# Patient Record
Sex: Female | Born: 1935 | Race: White | Hispanic: No | Smoking: Never smoker
Health system: Southern US, Community
[De-identification: ages and names within clinical notes are randomized; demographics above are authoritative.]

## PROBLEM LIST (undated history)

## (undated) DIAGNOSIS — F329 Major depressive disorder, single episode, unspecified: Secondary | ICD-10-CM

## (undated) DIAGNOSIS — I10 Essential (primary) hypertension: Secondary | ICD-10-CM

## (undated) DIAGNOSIS — D649 Anemia, unspecified: Secondary | ICD-10-CM

## (undated) DIAGNOSIS — E785 Hyperlipidemia, unspecified: Secondary | ICD-10-CM

## (undated) DIAGNOSIS — E039 Hypothyroidism, unspecified: Secondary | ICD-10-CM

## (undated) DIAGNOSIS — M1990 Unspecified osteoarthritis, unspecified site: Secondary | ICD-10-CM

## (undated) DIAGNOSIS — K219 Gastro-esophageal reflux disease without esophagitis: Secondary | ICD-10-CM

## (undated) DIAGNOSIS — E079 Disorder of thyroid, unspecified: Secondary | ICD-10-CM

## (undated) DIAGNOSIS — Z9289 Personal history of other medical treatment: Secondary | ICD-10-CM

## (undated) DIAGNOSIS — F419 Anxiety disorder, unspecified: Secondary | ICD-10-CM

## (undated) DIAGNOSIS — Z973 Presence of spectacles and contact lenses: Secondary | ICD-10-CM

## (undated) DIAGNOSIS — Z9889 Other specified postprocedural states: Secondary | ICD-10-CM

## (undated) DIAGNOSIS — G4700 Insomnia, unspecified: Secondary | ICD-10-CM

## (undated) DIAGNOSIS — K5900 Constipation, unspecified: Secondary | ICD-10-CM

## (undated) DIAGNOSIS — F32A Depression, unspecified: Secondary | ICD-10-CM

## (undated) DIAGNOSIS — E1129 Type 2 diabetes mellitus with other diabetic kidney complication: Secondary | ICD-10-CM

## (undated) DIAGNOSIS — M5410 Radiculopathy, site unspecified: Secondary | ICD-10-CM

## (undated) HISTORY — DX: Radiculopathy, site unspecified: M54.10

## (undated) HISTORY — PX: ABDOMINAL HYSTERECTOMY: SHX81

## (undated) HISTORY — DX: Depression, unspecified: F32.A

## (undated) HISTORY — PX: APPENDECTOMY: SHX54

## (undated) HISTORY — DX: Type 2 diabetes mellitus with other diabetic kidney complication: E11.29

## (undated) HISTORY — DX: Anemia, unspecified: D64.9

## (undated) HISTORY — PX: COLONOSCOPY: SHX174

## (undated) HISTORY — DX: Disorder of thyroid, unspecified: E07.9

## (undated) HISTORY — DX: Other specified postprocedural states: Z98.890

## (undated) HISTORY — DX: Major depressive disorder, single episode, unspecified: F32.9

## (undated) HISTORY — DX: Hyperlipidemia, unspecified: E78.5

## (undated) HISTORY — DX: Anxiety disorder, unspecified: F41.9

## (undated) HISTORY — PX: BREAST BIOPSY: SHX20

## (undated) HISTORY — DX: Gastro-esophageal reflux disease without esophagitis: K21.9

## (undated) HISTORY — PX: TONSILLECTOMY: SUR1361

---

## 1998-03-01 ENCOUNTER — Ambulatory Visit (HOSPITAL_COMMUNITY): Admission: RE | Admit: 1998-03-01 | Discharge: 1998-03-01 | Payer: Self-pay | Admitting: Cardiology

## 1998-12-26 ENCOUNTER — Encounter: Payer: Self-pay | Admitting: Internal Medicine

## 1998-12-26 ENCOUNTER — Ambulatory Visit (HOSPITAL_COMMUNITY): Admission: RE | Admit: 1998-12-26 | Discharge: 1998-12-26 | Payer: Self-pay | Admitting: Internal Medicine

## 1999-01-24 ENCOUNTER — Encounter: Payer: Self-pay | Admitting: Internal Medicine

## 1999-01-24 ENCOUNTER — Ambulatory Visit (HOSPITAL_COMMUNITY): Admission: RE | Admit: 1999-01-24 | Discharge: 1999-01-24 | Payer: Self-pay | Admitting: Internal Medicine

## 1999-07-29 ENCOUNTER — Ambulatory Visit (HOSPITAL_COMMUNITY): Admission: RE | Admit: 1999-07-29 | Discharge: 1999-07-29 | Payer: Self-pay | Admitting: Internal Medicine

## 1999-07-29 ENCOUNTER — Encounter: Payer: Self-pay | Admitting: Internal Medicine

## 2000-01-14 ENCOUNTER — Ambulatory Visit (HOSPITAL_COMMUNITY): Admission: RE | Admit: 2000-01-14 | Discharge: 2000-01-14 | Payer: Self-pay | Admitting: Orthopaedic Surgery

## 2000-03-04 ENCOUNTER — Ambulatory Visit (HOSPITAL_COMMUNITY): Admission: RE | Admit: 2000-03-04 | Discharge: 2000-03-04 | Payer: Self-pay | Admitting: Gastroenterology

## 2000-07-29 ENCOUNTER — Encounter: Payer: Self-pay | Admitting: Internal Medicine

## 2000-07-29 ENCOUNTER — Ambulatory Visit (HOSPITAL_COMMUNITY): Admission: RE | Admit: 2000-07-29 | Discharge: 2000-07-29 | Payer: Self-pay | Admitting: Internal Medicine

## 2001-07-30 ENCOUNTER — Ambulatory Visit (HOSPITAL_COMMUNITY): Admission: RE | Admit: 2001-07-30 | Discharge: 2001-07-30 | Payer: Self-pay | Admitting: Internal Medicine

## 2001-07-30 ENCOUNTER — Encounter: Payer: Self-pay | Admitting: Internal Medicine

## 2001-08-03 ENCOUNTER — Encounter: Payer: Self-pay | Admitting: Internal Medicine

## 2001-08-03 ENCOUNTER — Encounter: Admission: RE | Admit: 2001-08-03 | Discharge: 2001-08-03 | Payer: Self-pay | Admitting: Internal Medicine

## 2002-01-03 ENCOUNTER — Encounter: Admission: RE | Admit: 2002-01-03 | Discharge: 2002-01-03 | Payer: Self-pay | Admitting: Internal Medicine

## 2002-01-03 ENCOUNTER — Encounter: Payer: Self-pay | Admitting: Internal Medicine

## 2002-08-04 ENCOUNTER — Ambulatory Visit (HOSPITAL_COMMUNITY): Admission: RE | Admit: 2002-08-04 | Discharge: 2002-08-04 | Payer: Self-pay | Admitting: Internal Medicine

## 2002-08-04 ENCOUNTER — Encounter: Payer: Self-pay | Admitting: Internal Medicine

## 2002-09-24 ENCOUNTER — Emergency Department (HOSPITAL_COMMUNITY): Admission: EM | Admit: 2002-09-24 | Discharge: 2002-09-24 | Payer: Self-pay | Admitting: Emergency Medicine

## 2002-09-28 ENCOUNTER — Encounter: Admission: RE | Admit: 2002-09-28 | Discharge: 2002-09-28 | Payer: Self-pay | Admitting: Internal Medicine

## 2002-09-28 ENCOUNTER — Encounter: Payer: Self-pay | Admitting: Internal Medicine

## 2003-08-07 ENCOUNTER — Encounter: Admission: RE | Admit: 2003-08-07 | Discharge: 2003-08-07 | Payer: Self-pay | Admitting: Internal Medicine

## 2003-08-07 ENCOUNTER — Encounter: Payer: Self-pay | Admitting: Internal Medicine

## 2003-11-11 HISTORY — PX: TOTAL KNEE ARTHROPLASTY: SHX125

## 2004-02-01 ENCOUNTER — Inpatient Hospital Stay (HOSPITAL_COMMUNITY): Admission: RE | Admit: 2004-02-01 | Discharge: 2004-02-06 | Payer: Self-pay | Admitting: Orthopedic Surgery

## 2004-02-09 ENCOUNTER — Ambulatory Visit (HOSPITAL_COMMUNITY): Admission: RE | Admit: 2004-02-09 | Discharge: 2004-02-09 | Payer: Self-pay | Admitting: Orthopedic Surgery

## 2004-08-21 ENCOUNTER — Encounter: Admission: RE | Admit: 2004-08-21 | Discharge: 2004-08-21 | Payer: Self-pay | Admitting: Internal Medicine

## 2005-04-21 ENCOUNTER — Ambulatory Visit (HOSPITAL_COMMUNITY): Admission: RE | Admit: 2005-04-21 | Discharge: 2005-04-21 | Payer: Self-pay | Admitting: Orthopedic Surgery

## 2005-08-22 ENCOUNTER — Encounter: Admission: RE | Admit: 2005-08-22 | Discharge: 2005-08-22 | Payer: Self-pay | Admitting: Internal Medicine

## 2005-10-10 ENCOUNTER — Encounter: Admission: RE | Admit: 2005-10-10 | Discharge: 2005-10-10 | Payer: Self-pay | Admitting: Orthopedic Surgery

## 2005-10-24 ENCOUNTER — Inpatient Hospital Stay (HOSPITAL_COMMUNITY): Admission: EM | Admit: 2005-10-24 | Discharge: 2005-10-28 | Payer: Self-pay | Admitting: Orthopedic Surgery

## 2005-10-24 ENCOUNTER — Ambulatory Visit: Payer: Self-pay | Admitting: Infectious Diseases

## 2005-11-10 HISTORY — PX: TOTAL KNEE REVISION: SHX996

## 2005-11-27 ENCOUNTER — Ambulatory Visit: Admission: RE | Admit: 2005-11-27 | Discharge: 2005-11-27 | Payer: Self-pay | Admitting: Orthopedic Surgery

## 2005-12-03 ENCOUNTER — Ambulatory Visit: Payer: Self-pay | Admitting: Infectious Diseases

## 2006-01-08 ENCOUNTER — Encounter (INDEPENDENT_AMBULATORY_CARE_PROVIDER_SITE_OTHER): Payer: Self-pay | Admitting: *Deleted

## 2006-01-08 ENCOUNTER — Inpatient Hospital Stay (HOSPITAL_COMMUNITY): Admission: RE | Admit: 2006-01-08 | Discharge: 2006-01-13 | Payer: Self-pay | Admitting: Orthopedic Surgery

## 2006-08-24 ENCOUNTER — Encounter: Admission: RE | Admit: 2006-08-24 | Discharge: 2006-08-24 | Payer: Self-pay | Admitting: Internal Medicine

## 2006-08-26 ENCOUNTER — Ambulatory Visit: Payer: Self-pay | Admitting: Infectious Diseases

## 2006-08-26 LAB — CONVERTED CEMR LAB
Basophils Absolute: 0.1 10*3/uL (ref 0.0–0.1)
Basophils percent auto: 1 % (ref 0–1)
CRP: 0.2 mg/dL (ref <0.6)
Calcium: 9.1 mg/dL (ref 8.4–10.5)
Eosinophils Absolute: 0.2 cells/mcL (ref 0.0–0.7)
MCHC: 32 g/dL (ref 30.0–36.0)
MCV: 97.6 fL (ref 78.0–100.0)
Monocytes Relative: 7 % (ref 3–11)
Neutro Abs: 3.7 10*3/uL (ref 1.7–7.7)
Neutrophils Relative %: 60 % (ref 43–77)
Platelets: 341 10*3/uL (ref 150–400)
Potassium: 3.8 meq/L (ref 3.5–5.3)
RBC: 3.68 M/uL — ABNORMAL LOW (ref 3.87–5.11)
RDW: 13.2 % (ref 11.5–14.0)
Sed Rate: 4 mm/hr (ref 0–22)
Sodium: 142 meq/L (ref 135–145)
Uric Acid, Serum: 3.6 mg/dL (ref 2.4–7.0)

## 2006-09-01 ENCOUNTER — Ambulatory Visit (HOSPITAL_COMMUNITY): Admission: RE | Admit: 2006-09-01 | Discharge: 2006-09-01 | Payer: Self-pay | Admitting: Infectious Diseases

## 2006-09-07 ENCOUNTER — Encounter: Admission: RE | Admit: 2006-09-07 | Discharge: 2006-09-07 | Payer: Self-pay | Admitting: Internal Medicine

## 2006-09-21 ENCOUNTER — Ambulatory Visit: Payer: Self-pay | Admitting: Infectious Diseases

## 2006-11-10 HISTORY — PX: HAMMER TOE SURGERY: SHX385

## 2006-11-10 HISTORY — PX: GANGLION CYST EXCISION: SHX1691

## 2006-12-31 ENCOUNTER — Encounter (INDEPENDENT_AMBULATORY_CARE_PROVIDER_SITE_OTHER): Payer: Self-pay | Admitting: *Deleted

## 2006-12-31 ENCOUNTER — Ambulatory Visit (HOSPITAL_BASED_OUTPATIENT_CLINIC_OR_DEPARTMENT_OTHER): Admission: RE | Admit: 2006-12-31 | Discharge: 2006-12-31 | Payer: Self-pay | Admitting: Orthopedic Surgery

## 2007-03-15 ENCOUNTER — Encounter: Admission: RE | Admit: 2007-03-15 | Discharge: 2007-03-15 | Payer: Self-pay | Admitting: Orthopedic Surgery

## 2007-03-18 ENCOUNTER — Ambulatory Visit (HOSPITAL_BASED_OUTPATIENT_CLINIC_OR_DEPARTMENT_OTHER): Admission: RE | Admit: 2007-03-18 | Discharge: 2007-03-18 | Payer: Self-pay | Admitting: Orthopedic Surgery

## 2007-06-19 ENCOUNTER — Inpatient Hospital Stay (HOSPITAL_COMMUNITY): Admission: EM | Admit: 2007-06-19 | Discharge: 2007-06-22 | Payer: Self-pay | Admitting: Emergency Medicine

## 2007-06-21 ENCOUNTER — Encounter (INDEPENDENT_AMBULATORY_CARE_PROVIDER_SITE_OTHER): Payer: Self-pay | Admitting: Pediatrics

## 2007-06-30 ENCOUNTER — Inpatient Hospital Stay (HOSPITAL_COMMUNITY): Admission: RE | Admit: 2007-06-30 | Discharge: 2007-07-01 | Payer: Self-pay | Admitting: Interventional Radiology

## 2007-07-14 ENCOUNTER — Encounter: Payer: Self-pay | Admitting: Interventional Radiology

## 2007-08-27 ENCOUNTER — Encounter: Admission: RE | Admit: 2007-08-27 | Discharge: 2007-08-27 | Payer: Self-pay | Admitting: Internal Medicine

## 2007-09-11 HISTORY — PX: CEREBRAL ANEURYSM REPAIR: SHX164

## 2007-09-29 ENCOUNTER — Ambulatory Visit (HOSPITAL_COMMUNITY): Admission: RE | Admit: 2007-09-29 | Discharge: 2007-09-29 | Payer: Self-pay | Admitting: Interventional Radiology

## 2008-02-25 ENCOUNTER — Ambulatory Visit (HOSPITAL_COMMUNITY): Admission: RE | Admit: 2008-02-25 | Discharge: 2008-02-25 | Payer: Self-pay | Admitting: Interventional Radiology

## 2008-03-13 ENCOUNTER — Inpatient Hospital Stay (HOSPITAL_COMMUNITY): Admission: EM | Admit: 2008-03-13 | Discharge: 2008-03-16 | Payer: Self-pay | Admitting: Emergency Medicine

## 2008-03-14 ENCOUNTER — Encounter (INDEPENDENT_AMBULATORY_CARE_PROVIDER_SITE_OTHER): Payer: Self-pay | Admitting: General Surgery

## 2008-07-22 ENCOUNTER — Emergency Department (HOSPITAL_COMMUNITY): Admission: EM | Admit: 2008-07-22 | Discharge: 2008-07-22 | Payer: Self-pay | Admitting: Emergency Medicine

## 2008-08-29 ENCOUNTER — Encounter: Admission: RE | Admit: 2008-08-29 | Discharge: 2008-08-29 | Payer: Self-pay | Admitting: Internal Medicine

## 2008-10-25 ENCOUNTER — Ambulatory Visit (HOSPITAL_COMMUNITY): Admission: RE | Admit: 2008-10-25 | Discharge: 2008-10-25 | Payer: Self-pay | Admitting: Interventional Radiology

## 2009-08-31 ENCOUNTER — Encounter: Admission: RE | Admit: 2009-08-31 | Discharge: 2009-08-31 | Payer: Self-pay | Admitting: Internal Medicine

## 2009-10-24 ENCOUNTER — Ambulatory Visit (HOSPITAL_COMMUNITY): Admission: RE | Admit: 2009-10-24 | Discharge: 2009-10-24 | Payer: Self-pay | Admitting: Interventional Radiology

## 2010-04-11 ENCOUNTER — Encounter (HOSPITAL_COMMUNITY): Admission: RE | Admit: 2010-04-11 | Discharge: 2010-04-12 | Payer: Self-pay | Admitting: Gastroenterology

## 2010-09-02 ENCOUNTER — Encounter: Admission: RE | Admit: 2010-09-02 | Discharge: 2010-09-02 | Payer: Self-pay | Admitting: Internal Medicine

## 2010-11-06 ENCOUNTER — Encounter
Admission: RE | Admit: 2010-11-06 | Discharge: 2010-11-06 | Payer: Self-pay | Source: Home / Self Care | Attending: Orthopedic Surgery | Admitting: Orthopedic Surgery

## 2010-11-10 HISTORY — PX: CHOLECYSTECTOMY: SHX55

## 2010-11-10 HISTORY — PX: JOINT REPLACEMENT: SHX530

## 2010-12-01 ENCOUNTER — Encounter: Payer: Self-pay | Admitting: Interventional Radiology

## 2011-01-27 LAB — CROSSMATCH: Antibody Screen: NEGATIVE

## 2011-02-11 LAB — CBC
HCT: 36.5 % (ref 36.0–46.0)
Hemoglobin: 12.6 g/dL (ref 12.0–15.0)
MCV: 95.6 fL (ref 78.0–100.0)
RBC: 3.82 MIL/uL — ABNORMAL LOW (ref 3.87–5.11)
WBC: 8.5 10*3/uL (ref 4.0–10.5)

## 2011-02-11 LAB — BASIC METABOLIC PANEL
BUN: 17 mg/dL (ref 6–23)
Chloride: 96 mEq/L (ref 96–112)
GFR calc Af Amer: >60 mL/min (ref >60)
Potassium: 3.4 mEq/L — ABNORMAL LOW (ref 3.5–5.1)

## 2011-02-11 LAB — APTT: aPTT: 30 seconds (ref 24–37)

## 2011-03-25 NOTE — Consult Note (Signed)
NAMEAVERIANA, CLOUATRE NO.:  [id number]   MEDICAL RECORD NO.:  [id number]          PATIENT TYPE:  INP   LOCATION:  3031                         FACILITY:  [facility]   PHYSICIAN:  Hilda Lias, [name]   DATE OF BIRTH:  12-15-1932   DATE OF CONSULTATION:  DATE OF DISCHARGE:                                 CONSULTATION   NEUROSURGICAL CONSULT:  Ms. Culton is a lady who was admitted to the hospital because of dizziness  and diplopia.  She has a history of diabetes and hypertension.  She was  admitted and a CT scan of the head was done, which noted the possibility  of aneurysm.  Patient underwent an MRI angio, which showed, in deed, she  has left internal carotid artery aneurysm.  Clinically, today, the  patient is much better.  She has no complaint whatsoever.  She underwent  carotid duplex, which showed there was no evidence of internal carotid  stenosis with abnormal flow of the vertebral artery.  She is being  scheduled for coiling next week by the radiologist.  I spoke with her, I  spoke also with her daughter.  There is no question this will best  option to proceed with coilingand in case that cannot be done they  probably will consider postponing the surgery, but most of the time that  is highly unlikely.  The family had a lot of questions and all of them  were answered.  I also spoke with Dr. Sharene Skeans.  We are going to follow,  very closely on Ms. Balbach while she is in the hospital.  Thank you.           ______________________________  Hilda Lias, [name]     EB/MEDQ  D:  06/21/2007  T:  06/21/2007  Job:  [id number]

## 2011-03-25 NOTE — H&P (Signed)
[name], [name] NO.:  [id number]   MEDICAL RECORD NO.:  [id number]          PATIENT TYPE:  INP   LOCATION:  1843                         FACILITY:  [facility]   PHYSICIAN:  Madaline Savage, MD        DATE OF BIRTH:  19-Oct-1933   DATE OF ADMISSION:  06/19/2007  DATE OF DISCHARGE:                              HISTORY & PHYSICAL   PRIMARY CARE PHYSICIAN:  [name], [name]   CHIEF COMPLAINT:  Dizziness and diplopia.   HISTORY OF PRESENT ILLNESS:  Ms. Bouska is a 77 year old Caucasian lady  with a history of diabetes mellitus, hypertension, hyperlipidemia who  comes in with complaints of feeling tired, seeing double and dizziness.  She states she has been feeling tired for the last one week, having no  energy to do anything.  She denies any focal weakness or any chest pain  or shortness of breath at this time.  She then started having dizziness  for the last 3 to 4 days.  She called her primary care doctor who  started her on a medication which did not help.  She started seeing  double 3 days ago which has been getting worse.  She continues to have  diplopia at this point in time.  She denies any headaches, any speech  disturbance, any dysphagia.  She denies any chest pain, shortness of  breath, abdominal pain, nausea or vomiting.   PAST MEDICAL HISTORY:  1. Diabetes mellitus which is diet controlled.  2. Hypertension.  3. Hypercholesterolemia.  4. History of an irregular heartbeat.  5. Gastroesophageal reflux disease.   PAST SURGICAL HISTORY:  She had right knee surgery in 2006.   ALLERGIES:  There are no known drug allergies.   CURRENT MEDICATIONS:  1. She is on Lasix 40 mg daily.  2. Estradiol 0.5 mg daily.  3. Atenolol 100 mg a day.  4. Verapamil 240 mg daily.  5. Lipitor 20 mg daily.  6. Mexiletine 150 mg daily.  7. Ranitidine two tablets daily.  8. Effexor 75 mg daily.  9. Fosamax weekly.  10.Vitamin D 3000 units daily.  11.Multivitamin tablet  daily.  12.Stool softener daily as needed.   SOCIAL HISTORY:  She lives at home, denies any history of smoking,  alcohol or drug abuse.   FAMILY HISTORY:  Her mother died in her 63s.  She had Alzheimer disease.  She also had history of coronary artery disease.  Her father died in his  50s.  He had prostate cancer.   REVIEW OF SYSTEMS:  GENERAL:  She denies recent weight loss, weight  gain, no fevers or chills.  HEENT:  No headaches.  No sore throat.  CARDIOVASCULAR:  Denies chest pain, palpitations.  RESPIRATORY:  No  shortness of breath or cough.  GI:  No abdominal pain, nausea, vomiting,  diarrhea or constipation.   PHYSICAL EXAMINATION:  GENERAL:  She is alert, oriented times 3.  VITAL SIGNS:  Temperature is 97.3, pulse rate 76, blood pressure 113/69,  respiratory rate of 20 per minute, oxygen saturation 96% on room air.  HEENT:  Head is normocephalic, atraumatic.  Pupils are bilaterally equal  and reacting to light.  Mucous membranes are moist.  NECK:  Supple.  No JVD.  No carotid bruit.  CARDIOVASCULAR:  S1 and S2 are heard.  Regular rate and rhythm.  No  murmurs, gallops or rubs.  CHEST:  Clear to auscultation.  ABDOMEN:  Soft, bowel sounds are heard.  EXTREMITIES:  No clubbing, cyanosis or edema.  CENTRAL NERVOUS SYSTEM:  She has grade 5/5 power in all four  extremities.  There are no sensory deficits.  There are no cerebellar  signs.   LABORATORY DATA:  White count is 9.4, hemoglobin 12.3, platelets 306.  Sodium 137, potassium 3.1, chloride 100, bicarb 35, BUN 25, creatinine  1, glucose 156.  INR is 0.9.  She had a CT of the head without contrast  which showed a left internal capsule and a left frontal infarct.  She  had a chest x-ray which showed no acute findings.   IMPRESSION:  1. 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  2. Hypertension.  3. Hypokalemia.  4. Prolonged QT interval.  5. Diabetes mellitus.   PLAN:  This is a 77 year old  lady with a history of hypertension and  diabetes who comes in with a left internal capsule and left frontal  infarct.  Her only symptoms are diplopia at this point in time.  She  does not have any other focal deficits.  I will consult neurology to see  her.  I will get an MRI / MRA and a 2D echocardiogram and carotid  Doppler on her.  She will need aggressive risk factor modifications  which are her cholesterol, diabetes and hypertension.  We will get  hemoglobin A1c, fasting lipid profile and homocysteine on her.  I will  start her on aspirin at this time.  She does have a prolonged QT on the  EKG.  I will repeat an EKG, check her magnesium level and, if it is low,  I will replace her magnesium.  I will continue her other home  medications at this time.  I will put her on DVT and GI prophylaxis.      Madaline Savage, MD  Electronically Signed     PKN/MEDQ  D:  06/19/2007  T:  06/19/2007  Job:  [id number]

## 2011-03-25 NOTE — Discharge Summary (Signed)
NAMEJILLIANA, [name] NO.:  [id number]   MEDICAL RECORD NO.:  [id number]          PATIENT TYPE:  OIB   LOCATION:  3108                         FACILITY:  [facility]   PHYSICIAN:  Sanjeev K. Deveshwar, M.D.DATE OF BIRTH:  1933/03/19   DATE OF ADMISSION:  06/30/2007  DATE OF DISCHARGE:  07/01/2007                               DISCHARGE SUMMARY   CHIEF COMPLAINT:  Cerebral aneurysm.   HISTORY OF PRESENT ILLNESS:  This is a very pleasant 77 year old female  who was admitted to [facility] recently from June 19, 2007 to  June 22, 2007 by Dr. Sharene Skeans with symptoms of diplopia and dizziness  x3-4 days.  A CT scan was performed in the emergency department.  This  was consistent with an ischemic event involving the white matter of the  left external capsule and the left frontal lobe.  The age was  indeterminate.  The findings were felt to be suspicious for an acute  infarct.  An MRI/MRA was performed on August 9.  This showed no acute  infarct.  It was felt that she had mild nonspecific white-matter-type  changes, probably related to small vessel disease.  The MRA did reveal  an 8.6 mm x 6.1 mm left internal carotid artery cavernous segment  aneurysm.  It was felt that this aneurysm may have contributed to the  patient's diplopia.  She also had changes consistent with  atherosclerotic disease.   Dr. Corliss Skains was asked to see the patient during her stay and on August  10 she had a cerebral angiogram which did confirm an 8.8 x 7 mm saccular  aneurysm arising from the superior hypophyseal region of the left  internal carotid artery intracranially.   Dr. Corliss Skains met with the patient, in the hospital, and plans were made  for endovascular treatment of the aneurysm.  The patient was started on  aspirin and Plavix; and return [facility] July 02, 2007  to undergo further evaluation and treatment.   PAST MEDICAL HISTORY SIGNIFICANT FOR:  1.  Hypertension.  2. Diabetes mellitus controlled by diet.  3. Gastroesophageal reflux disease.  4. Hyperlipidemia.  5. A possible previous CVA by CT scan.  6. A history of an irregular heart rate.  7. A history of depression.  8. A history of osteoporosis and osteopenia.   SURGICAL HISTORY SIGNIFICANT FOR:  1. Three right knee surgeries as well as one total knee replacement      which became infected and required removal with subsequent second      replacement.  2. The patient has had bilateral iridectomies.  3. A hysterectomy.  4. Appendectomy.  5. Tonsillectomy.  6. She denies previous problems with anesthesia.   MEDICATIONS ON ADMISSION:  Included Xanax, aspirin, Plavix, Lipitor,  Lasix, atenolol, verapamil, mexiletine, Zantac, Effexor, Fosamax, and  vitamin D.   ALLERGIES:  The patient reports being allergic to CODEINE.   SOCIAL HISTORY:  The patient is married.  She has a very supportive  family.  She does not use alcohol or tobacco.   FAMILY HISTORY:  Her  mother died of Alzheimer's disease as well as  breast cancer.  Her father died of cancer of the prostate.  She has  brothers and sisters with hypertension and heart disease, but no CVA  history.   HOSPITAL COURSE:  As noted, this patient was admitted to [facility] on June 30, 2007 to undergo endovascular treatment of a  cerebral aneurysm to be performed by Dr. Corliss Skains.  She had already had  a cerebral angiogram performed on August 10 which had confirmed the  aneurysm.  On June 30, 2007 the patient underwent stent assisted  coiling of the aneurysm performed by Dr. Corliss Skains under general  anesthesia.  There were no known or immediate complications.  Please see  his dictated report for full details.   Following the procedure the patient was admitted to the neuro intensive  care unit where she remained on IV heparin overnight.  The following day  the heparin was discontinued.  The right femoral groin  sheath was  removed.  The patient remained stable from a neurological standpoint.   She was noted to be hypokalemic with a potassium of 3.1 on August 21.  This was supplemented.  She was also mildly anemic with hemoglobin of  9.5, hematocrit 27.6 on August 21.  This was felt possibly secondary to  fluid hydration as there was not a significant blood loss during the  procedure.  Outpatient follow up was recommended.   Following the removal of her right femoral groin sheath, the patient  remained on bedrest for 6 hours.  Dr. Corliss Skains examined her prior to  discharge; the patient still had some mild diplopia while diverting her  gaze extremely laterally to the left.  Arrangements were made to  discharge the patient that evening in stable and improved condition.   LABORATORY DATA:  A hemoglobin on the day of discharge was 9.5,  hematocrit 27.6, WBC 6,700, platelets 296,000.  A basic metabolic panel  on the day of discharge revealed a BUN of 12, creatinine 0.84.  GFR was  greater than 60.  Potassium was 3.1; glucose was 145.  On August 15th  hemoglobin had been 12.2, hematocrit 35.9, WBCs 7.9 thousand, platelets  were 299,000.  A chemistry profile on the 15th revealed BUN 20,  creatinine 0.85, potassium 3.9, glucose 134, GFR was greater than 60.   DISCHARGE MEDICATIONS:  The patient was told to resume her previous  medications as prior to admission.  She was to continue on aspirin and  Plavix until seen in followup by Dr. Corliss Skains.  Please see the list of  her medications as noted above.   The patient is told to stay on a low-cholesterol, low-sodium, diabetic  diet.  She was given instructions regarding wound care.  She was told  not to drive or do anything strenuous for at least 2 weeks.  A followup  cerebral angiogram was recommended in 3 months.   The patient was told to contact her primary care physician, Dr. Milinda Cave  within 1 week for a repeat CBC and potassium level.  She was  also to see  Dr. Corliss Skains in approximately 2 weeks.  The office will call to  schedule this appointment.   DISCHARGE DIAGNOSIS:  1. Symptomatic left internal carotid artery cavernous segment      aneurysm.  2. Status post stent-assisted coiling of the aneurysm performed June 30, 2007.  3. History of diplopia and dizziness.  4. Nonspecific white matter changes  felt to be related to small vessel      disease by MRI performed August 9, but no acute infarct.  5. History of hypertension.  6. History of gastroesophageal reflux disease.  7. History of hyperlipidemia.  8. Diabetes mellitus controlled by diet.  9. History of an irregular heart rate details unknown.  10.History of depression.  11.History of osteoporosis.  12.Status post multiple surgeries.  13.Reported CODEINE allergy.  14.Hypokalemia, outpatient follow up recommended.  15.Mild anemia outpatient follow up recommended.      Delton See, P.A.    ______________________________  Grandville Silos. Corliss Skains, [name]    DR/MEDQ  D:  07/02/2007  T:  07/03/2007  Job:  [id number]   cc:   Deanna Artis. Sharene Skeans, [name]  [name], [name]  Hilda Lias, [name]

## 2011-03-25 NOTE — H&P (Signed)
[name], [name] NO.:  [id number]   MEDICAL RECORD NO.:  [id number]          PATIENT TYPE:  INP   LOCATION:  2621                         FACILITY:  [facility]   PHYSICIAN:  Isidor Holts, [name]  DATE OF BIRTH:  12/30/1932   DATE OF ADMISSION:  03/13/2008  DATE OF DISCHARGE:                              HISTORY & PHYSICAL   PRIMARY MEDICAL DOCTOR:  Lovenia Kim, D.O.   CHIEF COMPLAINT:  Abdominal pain for 2 days.   HISTORY OF PRESENT ILLNESS:  This is a 77 year old female.  For past  medical history, see below.  According to the patient, at about noon on  Mar 12, 2008, she finished a meal of fried chicken, potatoes and salad  and [place]. She soon developed upper abdominal  pain right across her upper abdomen, constant, radiating through to the  back, unrelieved by Zantac.  She denies vomiting, chest pain, shortness  of breath, or diarrhea.  She moved her bowels at about 2 a.m. on Mar 13, 2008.  Denies chills or fever.  At about 4 a.m. on Mar 13, 2008, because  of unremitting symptoms, her husband brought her to the emergency  department.   PAST MEDICAL HISTORY:  1. Diet-controlled type 2 diabetes mellitus.  2. Hypertension.  3. Dyslipidemia.  4. History of arrhythmia.  5. GERD.  6. Osteoporosis.  7. Osteoarthritis.  8. Status post right knee surgery x3.  Infected right knee prosthesis      December 2006, status post removal and replacement January 2007.  9. Depression.  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  Has residual diplopia.  11.Left internal carotid cavernous segment aneurysm, status post stent      assisted coiling, June 30, 2007, by Dr. Grandville Silos. Deveshwar.  12.Status post bilateral iridectomies.  13.Status post hysterectomy.  14.Status post appendectomy.  15.Status post tonsillectomy.  16.Status post left foot surgeries May 2008 by Deidre Ala, [name]   MEDICATIONS:  1. Lasix 40 mg p.o. daily.  2. Estradiol 0.5 mg p.o. daily.  3. Atenolol 100 mg p.o. daily.  4. Verapamil 240 mg p.o. daily.  5. Lipitor 20 mg p.o. daily.  6. Mexiletine 150 mg p.o. daily.  7. Alprazolam 1 mg p.o. p.r.n.  8. Effexor 75 mg p.o. daily.  9. Vitamin D 6000 units p.o. daily.  10.Multivitamin one p.o. daily.  11.Fosamax 70 mg p.o. q. weekly.  12.Aspirin 81 mg p.o. daily.  13.Zantac 300 mg p.o. daily.  14.Vitamin B complex one p.o. daily.  15.Gabapentin 600 mg p.o. daily.  16.Hydroxyzine 500 mg 2 pills p.o. p.r.n.   ALLERGIES:  CODEINE.  This causes confusion.   REVIEW OF SYSTEMS:  As per HPI and chief complaint otherwise negative.  The patient still experiences diplopia.   SOCIAL HISTORY:  The patient is married, has four offspring.  Nonsmoker,  nondrinker.  Has no history of drug abuse.   FAMILY HISTORY:  The patient's mother died in her 80s.  She had  Alzheimer's  disease, coronary artery disease and breast cancer.  Her  father died in his 43s.  He had prostate cancer.  Family history is  otherwise noncontributory.   PHYSICAL EXAMINATION:  VITALS:  Temperature 97.3, pulse 60 per minute  and regular, respiratory 20, BP 142/72 mmHg, pulse oximeter 96% on room  air.  GENERAL:  The patient did not appear to be in obvious acute  distress at the time of this evaluation following intravenous  administration of analgesics, i.e. Morphine 4 mg in the emergency  department.  She was communicative, not short of breath, alert, fully  oriented.  HEENT:  No clinical pallor or jaundice.  No conjunctival injection.  Throat is clear.  NECK:  Supple.  JVP not seen.  No palpable lymphadenopathy.  No palpable  goiter.  CHEST:  Clinically clear to auscultation.  No wheezes or crackles heard.  CARDIAC:  Heart sounds S1 and S2 heard, normal, regular.  Soft systolic  murmur.  ABDOMEN:  Moderately obese, full, soft.  There is epigastric tenderness.  However, no guarding,  no rebound.  No palpable organomegaly.  Normal  bowel sounds.  Old healed surgical scars are noted.  EXTREMITIES:  On lower extremity examination, the patient has  varicosities.  However, no pitting edema.  Palpable peripheral pulses.  Scar is noted over the right knee.  MUSCULOSKELETAL:  Osteoarthritic changes are noted and patella seems to  be absent over the right knee.  Surgical scars are present.  The patient  has osteoarthritic changes and scars are noted over the dorsum of the  left foot.  CENTRAL NERVOUS SYSTEM:  No focal neurologic deficit on gross  examination.   LABORATORY DATA:  WBC 11.4, hemoglobin 11.9, hematocrit 35.4, platelets  319,000.  Urinalysis negative.  Electrolytes:  Sodium 136, potassium  3.3, chloride 94, CO2 27, BUN 12, creatinine 0.82, glucose 118, AST 397,  ALT 192, alkaline phosphatase 83, lipase 5966.  Abdominal ultrasound  scan dated Mar 13, 2008 shows distended gallbladder with multiple stones  and positive sonographic Murphy's sign.  Bile duct is dilated to 9.3 mm.  A 12-lead EKG dated Mar 13, 2008 shows sinus rhythm regular, 61 per  minute, normal axis, no acute ischemic changes.   ASSESSMENT AND PLAN:  1. Acute gallstone pancreatitis with cholelithiasis and acute      cholecystitis.  We shall admit the patient to the step-down unit      for close observation, bowel rest, proton pump inhibitor,      intravenous fluids, analgesics and broad-spectrum antibiotic      coverage with Primaxin.  We shall arrange abdominal CT scan to      further elucidate intra-abdominal anatomy.  A GI consult is      indicated, as the patient will likely require ERCP.  I understand      that the patient has already been seen by Dr. Madilyn Fireman in the      emergency department.  In addition, we shall consult surgery as the      patient will likely require cholecystectomy in due course.      Reportedly Dr. Abbey Chatters  has been called by the ED [name]   1. Hypertension.  We shall  institute parenteral antihypertensives, as      the patient is going to be n.p.o.   1. Dyslipidemia.  It is important to rule out hypertriglyceridemia.      We shall, therefore, do lipid profile and TSH.   1. History of diet-controlled type 2 diabetes  mellitus.  For now we      shall manage with sliding scale insulin coverage.   Further management will depend on clinical course.      Isidor Holts, [name]  Electronically Signed     CO/MEDQ  D:  03/13/2008  T:  03/13/2008  Job:  [id number]   cc:   Lovenia Kim, D.O.

## 2011-03-25 NOTE — Op Note (Signed)
[name], DOTO NO.:  [id number]   MEDICAL RECORD NO.:  [id number]          PATIENT TYPE:  INP   LOCATION:  2621                         FACILITY:  [facility]   PHYSICIAN:  Adolph Pollack, M.D.DATE OF BIRTH:  1933/05/06   DATE OF PROCEDURE:  DATE OF DISCHARGE:                               OPERATIVE REPORT   PREOPERATIVE DIAGNOSIS:  Biliary pancreatitis.   POSTOPERATIVE DIAGNOSIS:  Biliary pancreatitis.   PROCEDURE:  Laparoscopic cholecystectomy with intraoperative  cholangiogram.   SURGEON:  Adolph Pollack, [name]   ASSISTANT:  Letha Cape, PA-C.   INDICATIONS:  This is a 77 year old female who came in with severe  abdominal pain and was found to have acute pancreatitis and gallstones  as well as elevation of liver function tests and lipase.  This was all  consistent with biliary pancreatitis.  She is placed on bowel rest and  hydration and she has rapidly improved clinically and chemically.  Plan  is to proceed with laparoscopic cholecystectomy with cholangiogram and  then postop ERCP if necessary.  Procedure and risks have been explained  to her preoperatively.   TECHNIQUE:  She is brought to the operating room, placed supine on the  operating table and general anesthetic was administered.  The abdominal  wall was sterilely prepped and draped.  Dilute Marcaine was infiltrated  in the supraumbilical region.  A small supraumbilical incision was made  through the skin, subcutaneous tissue, and fascial layers entering the  peritoneal cavity under direct vision.  A pursestring suture of 0-Vicryl  was placed around the fascial edges.  A Hassan trocar was introduced to  the peritoneal cavity and pneumoperitoneum was created by insufflation  of CO2 gas.   Next, the laparoscope was introduced.  She had previous hysterectomy and  appendectomy and a relatively few adhesions to the abdominal wall.   She was placed in the reverse Trendelenburg  position, the right side  tilted slightly up.  An 11-mm trocar was placed in the epigastric  incision and two 5-mm trocars placed in the right mid lateral abdomen.  The fundus of the gallbladder was grasped.  There were adhesions between  the omentum and the gallbladder.  These were taken down bluntly staying  close to the gallbladder.  The fundus was then retracted toward the  right shoulder.  The infundibulum was grasped and retracted laterally  and mobilized by dissection close to the gallbladder.  The cystic duct  was identified.  I was able to create a window around it.  I placed a  clip at the cystic duct gallbladder junction.  A small incision was made  in the cystic duct.  I milked back two soft stones from the cystic duct,  which decreased this diameter.  Following this, I placed a  cholangiocatheter into abdominal cavity and then into the cystic duct to  perform the cholangiogram.   Under real-time fluoroscopy, dilute contrast was injected into the  cystic duct, which was of short to moderate length.  Common hepatic,  right and left hepatic, bile ducts filled and contrast drained fairly  promptly into the duodenum.  I did not see a meniscus sign or obvious  filling defect, but the final reports pending the radiologist's  interpretation.  There did not appear to be any obstruction to contrast  flow.   Following this, I removed the cholangiocatheter and clipped the cystic  duct three times on the staying in side and divided it.  I then  identified the anterior branch of the cystic artery and created a window  around it.  It was clipped and divided.  The posterior branch of the  cystic artery was identified and a window created around it, and it was  clipped and divided.  I then began dissecting the gallbladder free from  liver using electrocautery.  I saw what appeared be a vascular structure  going straight from the midportion of the gallbladder fossa to the  gallbladder and  this was clipped and then cauterized.  The gallbladder  was then dissected free from rest of the liver and then placed in  Endopouch bag.   I copiously irrigated out the gallbladder fossa.  Bleeding points were  controlled with electrocautery.  Further inspection demonstrated no  evidence of bile leak or no further bleeding.  I then evacuated the  irrigation fluid and placed some Surgicel in the gallbladder fossa.   Following this, the gallbladder was removed in the Endopouch bag through  the supraumbilical port.  Under laparoscopic vision,  supraumbilical  fascial defect was closed by tightening up and tying down the  pursestring suture.  The remaining trocars were removed and a  pneumoperitoneum was released.  The skin incisions were closed with 4-0  Monocryl subcuticular stitches followed by Steri-Strips and sterile  dressing.   She tolerated the procedure well without any apparent complications and  was taken to recovery in satisfactory condition.      Adolph Pollack, [name]  Electronically Signed     TJR/MEDQ  D:  03/14/2008  T:  03/15/2008  Job:  [id number]

## 2011-03-25 NOTE — Consult Note (Signed)
[name], [name] NO.:  [id number]   MEDICAL RECORD NO.:  [id number]          PATIENT TYPE:  INP   LOCATION:  2621                         FACILITY:  [facility]   PHYSICIAN:  Adolph Pollack, M.D.DATE OF BIRTH:  September 05, 1933   DATE OF CONSULTATION:  03/13/2008  DATE OF DISCHARGE:                                 CONSULTATION   TIME OF CONSULTATION:  10:50 a.m.   CONSULTING SURGEON:  Adolph Pollack, MD   REQUESTING PHYSICIAN:  Dr.  Estell Harpin with the emergency room.   ADMITTING PHYSICIAN:  Dr. Brien Few with [facility].   PRIMARY CARE PHYSICIAN:  Lovenia Kim, DO.   GI CONSULT:  John C. Madilyn Fireman, MD.   REASON FOR CONSULTATION:  Biliary pancreatitis and cholelithiasis.   HISTORY OF PRESENT ILLNESS:  [name] is a 77 year old female patient  with longstanding history of GERD usually nocturnal in nature who  presented to the ER because of unrelenting epigastric and right upper  quadrant abdominal pain.  The pain was associated with nausea and had  been ongoing since about 2:00 p.m. on Sunday.  The patient's pain was  relieved in the ER after receiving IV narcotic medications.  In the ER,  the patient was found to have mild leukocytosis, mild transaminitis with  a normal total bilirubin but a marked elevation in lipase at 5966.  Abdominal ultrasound revealed a positive sonographic Murphy's sign,  pericholecystic fluid and cholelithiasis.  The patient has been admitted  by Internal Medicine because of multiple underlying chronic medical  problems and GI consultant Dr. Madilyn Fireman has evaluated the patient with  possible ERCP in the morning.  Surgical consultation was requested for  possible laparoscopic cholecystectomy once the above medical issues have  stabilized.   REVIEW OF SYSTEMS:  CONSTITUTIONAL:  As per above.  No fevers, no  chills.  No myalgias.  CARDIOVASCULAR:  The patient does have chronic  lower extremity venous insufficiency and has significant  aching in her  legs despite regular use of compressive hose.  GI:  The patient usually  has GERD symptoms at night after the evening meal.  NEURO:  The patient  has had no further trouble related to her carotid aneurysm.  No further  dizziness.  She has had chronic diplopia.  She is going to [facility].  She has been advised to undergo  blepharoplasty because it is felt that eyelid problems are contributing  to her diplopia.   FAMILY MEDICAL HISTORY:  Noncontributory.   SOCIAL:  No alcohol, no tobacco and no drug.  She is married and has  several children and several grandchildren.   PAST MEDICAL HISTORY:  1. GERD.  2. Osteoporosis.  3. Osteopenia.  4. Diabetes mellitus, possible peripheral neuropathy.  5. Dyslipidemia.  6. Hypertension.  7. Chronic lower extremity venous insufficiency.  8. Irregular heartbeat.  No history of atrial fibrillation.  9. Left carotid aneurysm with cavernous sinus status post colon      procedure in Interventional Radiology on June 30, 2007.   PAST SURGICAL HISTORY:  1. Total  hysterectomy.  2. Appendectomy.  3. Multiple surgeries on the lower extremities involving on the right      lower extremity, several knee surgeries including a right total      knee replacement with subsequent removal of infected hardware in      2007.  4. Multiple procedures on the left foot because of hallus valgus, as      well as other problems from chronic foot deformity.   ALLERGIES:  CODEINE.   CURRENT MEDICATIONS:  1. Lasix 40 daily.  2. Estradiol  0.5 mg daily.  3. Atenolol 100 mg daily.  4. Verapamil 140 mg daily.  5. Lipitor 20 mg daily.  6. Mexiletine 150 mg daily.  7. Alprazolam 1 mg p.r.n.  8. Effexor 75 mg daily.  9. Vitamin D 6000 units daily.  10.Multiple vitamins daily.  11.Fosamax weekly.  12.Aspirin 81 mg daily.  13.Zantac 300 mg daily.  14.Over-the-counter vitamin B complex.  15.Gabapentin 600 mg p.r.n.   16.Hydroxyzine as needed.   PHYSICAL EXAMINATION:  GENERAL:  Pleasant female patient appears stated  age, currently, reports previous severe abdominal pain has resolved.  VITAL SIGNS:  Temperature 97.1, BP 142/72, pulse 60 and regular,  respirations 20.  HEENT:  Sclera noninjected.  Conjunctivae are pink.  Pupils are equal,  round and reactive to light.  Ears, nose, mouth and throat, ears are  symmetric without lesions.  Nose is without rhinorrhea, midline.  Mouth,  oral mucous membranes are pink and moist.  NECK:  Trachea is midline.  Thyroid is down and non-palpable.  RESPIRATORY EFFORT:  Nonlabored.  Bilateral lung sounds are clear to  auscultation.  She is on room air sating 96%.  CARDIOVASCULAR:  She does  have S1-S2 sounds but also has a distinct systolic murmur, pansystolic  left sternal border best auscultated at fourth intercostal space, does  not radiate to the axilla, grade 2/6.  Pulse is otherwise regular, non-  tachycardiac.  No peripheral edema is noted.  Pulses bilaterally  carotid, radial and pedal are 1 to 2+.  I did not auscultate for carotid  bruits.  GI:  Abdomen is soft, nondistended.  Bowel sounds are present and  normoactive in all four quadrants.  She is mildly tender in the right  upper quadrant in epigastric region.  No hernias, no masses, no guarding  or rebounding.  No hepato or splenomegaly.  MUSCULOSKELETAL:  Extremities are symmetrical in appearance with previous deformities in  the left foot and prior surgical repairs and scars as noted.  Surgical  scars on the right knee.  SKIN:  The patient has a midline incision extending from this symphysis  of pubis to the umbilicus and an essentially surgically absent  umbilicus.  NEURO:  Cranial nerves II through XII are grossly intact.  The patient  reports diplopia with moving the head to the left.  This is chronic in  nature.  Sensation is intact in the upper and lower extremities.  PSYCH:  The patient is  oriented to time, person, place and situation.  Affect is appropriate to situation.   LAB:  Lipase 5966.  White count 11,400 with neutrophils 79%, hemoglobin  11.9, platelets 319,000.  Sodium 136, potassium 3.3, chloride 94, CO2  27, glucose 180, BUN 12, creatinine 0.82, total bilirubin 0.6, alkaline  phosphatase 83, AST 397, and ALT 192.  Diagnostics ultrasound of the  abdomen is noted.   IMPRESSION:  1. Acute Biliary Pancreatitis  2. Diabetes mellitus.  3. History of left  carotid aneurysm status post coiling with chronic      diplopia.  4. Hypertension and dyslipidemia.   PLAN:  1. I agree with admission per Internal Medicine and their current plan      of care.  2. Possible endoscopic retrograde cholangiopancreatography in the      morning, follow-up on transaminases.  3. Once pancreatitis has cooled down, plan to proceed with      laparoscopic cholecystectomy.  Risks and benefits of this procedure      have been discussed with the patient per Dr. Abbey Chatters.  Risks of      potential for open cholecystectomy procedure given prior abdominal      procedures.  The patient verbalizes understanding.  4. Agree with empiric Primaxin.  Possible to allow clear liquids if      okay with Gastroenterology especially, pancreatitis.  Numbers are      within normal limits later this afternoon and first thing in the      morning.  Intravenous pain medications and medications for nausea      and vomiting p.r.n.      [name], N.P.      Adolph Pollack, [name]  Electronically Signed    ALE/MEDQ  D:  03/13/2008  T:  03/14/2008  Job:  [id number]   cc:   Lovenia Kim, D.O.  Isidor Holts, [name]  Bethann Berkshire, MD  Everardo All. Madilyn Fireman, [name]

## 2011-03-25 NOTE — Consult Note (Signed)
[name], Wise NO.:  [id number]   MEDICAL RECORD NO.:  [id number]          PATIENT TYPE:  INP   LOCATION:  2621                         FACILITY:  [facility]   PHYSICIAN:  John C. Madilyn Fireman, [name]    DATE OF BIRTH:  [birth year]   DATE OF CONSULTATION:  03/13/2008  DATE OF DISCHARGE:                                 CONSULTATION   HISTORY OF PRESENT ILLNESS:  This is a 77 year old female with no prior  history of gallstone problems or pancreatitis.  She had sudden onset of  abdominal pain and nausea after eating fried chicken and potato salad at  a picnic on Mar 12, 2008.  The pain is just below her ribs.  She tells me  it feels like somebody is pulling a rope around her center.  It radiates  to her right back.  It is constant in nature.  She says that she has  experienced nausea, but denies any vomiting or change in bowel habits  though she has good bowel movement this morning.  Her GERD is well  controlled with Zantac.  She does take 81 mg aspirin per day, but no  excessive NSAID use.  She has had no recent illness and no new  medications.   PRIMARY CARE PHYSICIAN:  Dr. [name].   PAST MEDICAL HISTORY:  Significant for cerebral aneurysm, type 2  diabetes, hypertension, GERD, and hyperlipidemia.  She had a stroke in  August 2008, arrhythmias, depression, osteoporosis, degenerative joint  disease, and venous insufficiency in lower extremities bilaterally.   PAST SURGICAL HISTORY:  She has had knee replacement with two revisions  on the right side.  She has bilateral iridectomies, hysterectomy,  appendectomy, tonsillectomy, multiple orthopedic surgeries on her  fingers and toes.   CURRENT MEDICATIONS:  Include Lasix, estradiol, atenolol, verapamil,  mexiletine,  , alprazolam, Effexor, vitamin C, Fosamax, and 81 mg  aspirin, Zantac, vitamin D,  gabapentin, and hydroxyzine.   ALLERGIES:  She has an allergy to CODEINE.   REVIEW OF SYSTEMS:  Negative for  shortness of breath, palpitations,  weight loss and fever.   SOCIAL HISTORY:  Negative for alcohol, tobacco and drugs.   FAMILY HISTORY:  Negative for gallbladder disease and pancreatic  disease.   PHYSICAL EXAMINATION:  GENERAL:  She is alert and oriented in no  apparent distress.  VITAL SIGNS:  Temperature is 97.1, pulse 60, respirations 20, and blood  pressure is 142/72.  HEART:  Regular rate and rhythm with a questionable systolic murmur.  LUNGS:  Clear to auscultation bilaterally.  ABDOMEN:  Soft, nontender to mild palpation.  She does have bloating in  her upper abdomen.  Bowel sounds are good.   Her labs show a hemoglobin of 11.9, hematocrit 35.4, platelets 319, and  white count 11.4.  BMET shows a potassium of 3.3 and a glucose of 180,  BUN and creatinine are both within normal limits.  LFTs showed AST of  397, ALT of 192, alk phos 83, and total bilirubin 0.6.  Lipase is  currently 5966.   On Mar 13, 2008, radiological  exams include an ultrasound of her abdomen  which showed distended gallbladder and cholelithiasis with no definitive  choledocholithiasis but she does have a distended CBD.   ASSESSMENT:  Dr. Dorena Cookey has seen and examined the patient, collected  history and reviewed her chart.  His impression is that this is a 83-  year-old female with gallstone pancreatitis.  We will keep her hydrated,  provide pain control and make her n.p.o. today.  Evaluate LFTs tomorrow  for possible ERCP and then proceed with cholecystectomy per [facility].      Melanie Police, PA    ______________________________  Everardo All Madilyn Fireman, [name]    MLY/MEDQ  D:  03/13/2008  T:  03/14/2008  Job:  [id number]   cc:   [name], [name]  John C. Madilyn Fireman, [name]

## 2011-03-25 NOTE — Consult Note (Signed)
[name], [name] NO.:  [id number]   MEDICAL RECORD NO.:  [id number]          PATIENT TYPE:  INP   LOCATION:  3031                         FACILITY:  [facility]   PHYSICIAN:  [name], M.D.DATE OF BIRTH:  12/05/1932   DATE OF CONSULTATION:  DATE OF DISCHARGE:                                 CONSULTATION   CHIEF COMPLAINT:  Double vision, unsteady gait, abnormal CT scan.   HISTORY OF PRESENT CONDITION:  [name] is a 77 year old woman who has a  2 to 3-day history of unsteady gait generalized fatigue and malaise, and  diplopia that may have begun last night, but definitely began this  morning.   The patient has not had any other neurologic deficits in terms of  weakness, numbness, problems swallowing, headache or altered mental  status or change in her language.   To the best of her knowledge, [name] has never had a stroke before (CT scan  shows that [name] may have had a silent stroke).   PAST MEDICAL HISTORY:  1. Irregular heart rate.  2. Gastroesophageal reflux disease.  3. Depression.  4. Postmenopausal state.  5. Hypertension.  6. Diabetes.  7. Dyslipidemia.  8. Osteoporosis or osteopenia.   PAST SURGICAL HISTORY:  The patient has had 3 right knee surgeries; one  a total knee replacement which became infected and be removed, and then  a second replacement.  Bilateral iridectomies, hysterectomy,  appendectomy, tonsillectomy.  The iridectomies were with lens implant.   CURRENT MEDICATIONS:  1. Lasix 40 mg daily.  2. Estradiol 1/2-mg daily.  3. Atenolol 100 mg daily.  4. Verapamil 240 mg daily.  5. Lipitor 20 mg in the evening.  6. Mexiletine 150 mg daily.  7. Ranitidine 300 mg at nighttime.  Effexor XR 75 mg daily.  8. Fosamax 70 mg weekly.  9. Vitamin A 3000 units a day.  10.Multivitamins daily  11.Stool softener daily.   DRUG ALLERGIES:  None known.   FAMILY HISTORY:  [name] died of Alzheimer's and cancer of the breast.  [name] died  of cancer of the prostate.  [name] has brothers and sisters who  have hypertension, heart disease, but no strokes.   SOCIAL HISTORY:  The patient is married.  Son is at the bedside.  [name]  does not use tobacco, alcohol or drugs.   REVIEW OF SYSTEMS:  No headache.  [name] has bilateral eyelid ptosis and  has had it for some time.  CARDIOVASCULAR:  No chest pain.  [name] has  hypertension.  PULMONARY:  No dyspnea.  GASTROINTESTINAL:  No nausea,  vomiting, diarrhea.  GENITOURINARY:  No urinary tract infection.  MUSCULOSKELETAL:  No pain.  [name] does have osteoarthritis.  [name] has had  an injection in her back.  ENDOCRINE:  [name] has diabetes mellitus.  REPRODUCTIVE:  Postmenopausal.  HEMATOLOGIC:  No anemia.  ALLERGY/IMMUNOLOGY:  Negative.  NEUROPSYCHIATRIC:  Negative.   PHYSICAL EXAMINATION:  Today temperature 97.3, blood pressure 116/71,  resting pulse 71, respirations 20, oxygen saturation 96%.  EARS, NOSE, AND THROAT:  No infections.  NECK:  Supple.  No  bruits.  LUNGS:  Clear.  HEART:  No murmurs.  Pulses normal.  ABDOMEN:  Soft.  Bowel sounds normal.  No hepatosplenomegaly.  SKIN:  No lesions, except for venous stasis changes.   NEUROLOGIC EXAMINATION:  Awake, alert, attentive, appropriate.  No  dysphasia, dyspraxia.  Cranial nerves:  Round reactive pupils, round,  somewhat irregular pupils.  Fundi were normal.  Visual fields full to  double simultaneous stimuli.  Extraocular movements full and conjugate.  I see no weakness in her eye movements, but [name] has definite complaints  of diplopia to inferior lateral gaze bilaterally and to mid-position.  All other directions, [name] seems to have fused vision.  [name] has symmetric  facial strength.  Midline tongue and uvula.  Sensation appears normal.  Hearing is normal.  [name] has a midline tongue.  Motor examination:  Normal strength, tone and mass.  Good fine motor movements.  No pronator  drift.  Sensation intact to cold, vibration, stereognosis.   Cerebellar  examination:  Good finger-to-nose, rapid hand movements.  Heel-knee-shin  and finger-to-nose were fine.  Gait was not tested.  Reflexes were  symmetric, normal in the upper extremities, diminished to absent in  lower extremities.  The patient had bilateral flexor plantar responses.  Her NIH stroke scale score is 0.  Modified Rankin score is 0.   LABORATORY STUDIES:  CT scan of the brain shows a low-density area  adjacent to the area of the left caudate.  The radiologist also thought  that there was an insular cortex lesion that I believe that I can not  appreciate.  The patient has some mild atrophy and mild white matter  changes.  I do not think that this is an acute stroke, although it was  called as such.   CBC:  white count 9400, hemoglobin 12.3, hematocrit 35.8, platelet count  306,000, 61 polys, 29 lymphs, 7 monos, 2 eosinophils, 1 basophil.  PT  12.3, INR was 0.9, PTT 28 sodium 137, potassium 3.1, chloride 100, CO2  33.7, BUN 20, creatinine 1, glucose 156.   IMPRESSION:  Brain stem ischemia with diplopia, dystaxia and generalized  fatigue.  We will evaluate her for a brain stem infarction.  I doubt  that [name] has an acute infarction in the left basal ganglia.  We will  continue current medications.  [name] needs a vascular laboratory workup in  addition to MRI and MRA.  [name] is not a candidate for TPA, clot retrieval  device or research protocol because of an [employer]  stroke scale of 0.  Also, her symptoms are much greater than 12 hours by  history.   I appreciate the opportunity to participate in her care.  [name] has had a  swallowing study which [name] passed.  We will continue to follow her on  the stroke consult service during her hospitalization.      [name], [name]  Electronically Signed     WHH/MEDQ  D:  06/19/2007  T:  06/21/2007  Job:  [id number]   cc:   Melanie Savage, MD

## 2011-03-25 NOTE — Discharge Summary (Signed)
[name], [name]                 ACCOUNT NO.:  [id number]   MEDICAL RECORD NO.:  [id number]          PATIENT TYPE:  INP   LOCATION:  5125                         FACILITY:  [facility]   PHYSICIAN:  Mobolaji B. Bakare, M.D.DATE OF BIRTH:  1933/06/06   DATE OF ADMISSION:  03/13/2008  DATE OF DISCHARGE:  03/16/2008                               DISCHARGE SUMMARY   PRIMARY CARE PHYSICIAN:  [name], MD   FINAL DIAGNOSES:  1. Acute cholecystitis.  2. Cholelithiasis.  3. Gallstone pancreatitis.  4. Normocytic anemia.  5. Transient diarrhea resolved.   SECONDARY DIAGNOSES:  1. Hypertension controlled.  2. Diabetes mellitus controlled.   PROCEDURES:  1. Ultrasound of the abdomen done on Mar 13, 2008 showed      cholelithiasis, tenderness over the distended gallbladder      suggesting acute cholecystitis, stable left renal cyst.  CT abdomen      with IV contrast showed findings consistent with acute      cholecystitis.  Gallbladder is distended.  There is pericholecystic      fluid and layering calcifications are noted.  His peripancreatic      exudates are not identified.  No evidence of pseudocyst or abscess.  2. Cholangiogram intraoperatively negative for retained common duct      stone.  3. Laparoscopic cholecystectomy done on Mar 14, 2008 by Dr. Abbey Chatters.   CONSULTATIONS:  1. Surgical consult provided by Dr. Abbey Chatters.  2. GI consult provided by Dr. Madilyn Fireman.   BRIEF HISTORY:  Please refer to the admission H&P dictated by Dr.  Isidor Holts.  In brief, [name] is a 77 year old Caucasian female  who presented with 2 days' history of abdominal pain, which was related  to meals.  She denied chills.  Upon evaluation in the emergency room,  she had right upper quadrant tenderness.  She was afebrile.  Initial  evaluation with an ultrasound showed gallstones.  She had elevated  lipase of 6000, alkaline phosphatase was normal at 83, AST 397, ALT 192,  both of which were  elevated.  She had normal renal function.  The  patient was admitted for gallstone pancreatitis, acute cholecystitis  based on clinical symptoms and an ultrasound finding.  GI and surgical  consultations were obtained.  1. Acute cholecystitis/cholelithiasis.  The patient was empirically      started on IV antibiotics.  She was seen by general surgery.  She      underwent laparoscopic cholecystectomy on Mar 14, 2008.  She      tolerated the procedure very well.  Postoperatively, the patient      improved remarkably.  She is currently abdominal pain free.  She      had an intraoperative cholangiogram to assess for retained stone,      this was negative.  The patient will follow up with Dr. Abbey Chatters      on Mar 28, 2008.  Instructions has been given for wound care.  2. Acute gallstone pancreatitis.  The patient had elevated lipase at      the time of admission clinically suggesting of acute  pancreatitis.      CT scan of the abdomen with contrast did not show evidence of      pancreatic inflammation.  Subsequent followup lipase normalized.      Lipase on admission was almost 6000 and a repeat lipase the      following day was 135.  The patient probably passed a stone.  She      was seen in consultation by GI.  It was felt unnecessary to pursue      ERCP at this point and after discussion with surgery decision was      made to do intraoperative cholangiogram.  Acute pancreatitis has      resolved.  The patient is tolerating low-carbohydrate diet.  3. Transient diarrhea.  The patient had an episode of diarrhea.  Two      loose bowel movements when she started p.o. intake.  This has since      resolved.  Stools we were unable to collect as stool specimen for      analysis.  4. Normocytic anemia.  The patient was admitted with a hemoglobin of      11.99, hematocrit of 35.4.  This trended downward during the course      of hospitalization and it is felt to be related to hemodilution.      At the  time of discharge, hemoglobin is 10.1 and hematocrit of      29.3.  She should have a follow up CBC in 2 weeks.  She tells me      that she is up-to-date with her colonoscopy.  She will be given no      iron on discharge.   DISCHARGE MEDICATIONS:  1. Lasix 40 mg daily.  2. Estradiol 1/2 tablet daily.  3. Atenolol 100 mg daily.  4. Verapamil 240 mg daily.  5. Lipitor 20 mg daily.  6. Mexiletine 150 mg daily.  7. Alprazolam 1 mg p.r.n.  8. Effexor 75 mg daily.  9. Vitamin B6 1000 units.  10.Aspirin 81 mg daily.  11.Zantac 300 mg daily.  12.Gabapentin 600 mg daily.  13.Percocet 5/325 1-2 tablets every 4 hours p.r.n. for pain.  14.Nu-Iron 150 mg daily.   DISCHARGE LABORATORY DATA:  Sodium 144, potassium 3.7, chloride 110, CO2  26, glucose 94, BUN 6, creatinine 0.76, total bilirubin 0.5, alkaline  phosphatase 58, AST 32, ALT 47, total protein 5.6, albumin 3.0.  White  cell 9.9, hemoglobin 10.3, hematocrit 39.3, platelets 268.  BNP 139.  Hemoglobin A1c 6.4.  TSH 2.636.   DISCHARGE INSTRUCTIONS:  The patient may remove tape and gauze, leave  Steri-Strips for 1-2 weeks.  Follow up with Dr. Maris Berger office on  Mar 28, 2008, between 2 and 4 p.m., to follow up with Dr. Oneta Rack in 2-3  weeks.  The patient may shower.  There has been no lifting for 2 weeks  of greater than 15 pounds weight.  Discharge condition is stable.  Incision site is clean and dry.  Vitals at the time of discharge,  temperature 98, pulse 74,  respiratory rate 18, blood pressure 115/68, O2 sats of 91% on room air.  Use incentive spirometer every hour when awake.   RECOMMENDATIONS:  Check CBC in 2 weeks.      Mobolaji B. Corky Downs, [name]  Electronically Signed     MBB/MEDQ  D:  03/16/2008  T:  03/17/2008  Job:  [id number]   cc:   [name], [name]  John C. Madilyn Fireman, [name]  Tawanna Cooler  Adalberto Cole, [name]

## 2011-03-25 NOTE — Discharge Summary (Signed)
NAMEJULIANE, Wise NO.:  [id number]   MEDICAL RECORD NO.:  [id number]          PATIENT TYPE:  INP   LOCATION:  3031                         FACILITY:  [facility]   PHYSICIAN:  Melanie Savage, MD        DATE OF BIRTH:  Nov 07, 1933   DATE OF ADMISSION:  06/19/2007  DATE OF DISCHARGE:  06/22/2007                               DISCHARGE SUMMARY   Primary care physician is [name], [name]   CONSULTATIONS IN THE HOSPITAL:  1. [name], [name], saw the patient from neurology.  2. [name], [name], saw the patient from interventional      radiology.  3. Hilda Lias, [name], saw the patient from neurosurgery.   DISCHARGE DIAGNOSES:  1. Diplopia, which has resolved.  2. 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  3. Hypertension.  4. Gastroesophageal reflux disease.  5. Hypercholesterolemia.  6. Diabetes mellitus.   DISCHARGE MEDICATIONS:  1. Plavix 75 mg daily, to be started on June 27, 2007.  2. Aspirin 325 mg daily.  3. Lasix 40 mg daily.  4. Estradiol 0.5 mg daily.  5. Atenolol 100 mg daily.  6. Verapamil 240 mg daily.  7. Lipitor 20 mg daily.  8. Mexiletine 150 mg daily.  9. Ranitidine 150 mg twice daily.  10.Effexor 75 mg daily.  11.Fosamax weekly.  12.Vitamin D 300 units daily.   HISTORY OF PRESENT ILLNESS:  For a full history and physical, see the  history and physical dictated by Dr. Darene Lamer on June 19, 2007.  In short,  [name] is a 77 year old Caucasian lady with a history of hypertension,  hypercholesterolemia, and diet-controlled diabetes, who comes in with an  acute onset of diplopia and dizziness for 3-4 days.  Her initial  evaluation included a CT scan done from the emergency room, which showed  acute infarct in the left internal capsule and left frontal area and so  she was admitted with a diagnosis of CVA for further evaluation and  management and neurology was consulted.   PROCEDURES DONE IN THE HOSPITAL:  1. She  had a CT head without contrast done on June 19, 2007, which      shows an ischemic event involving the white matter of left external      capsule and left frontal lobe.  2. She had an x-ray chest done on June 19, 2007, which showed no      acute cardiopulmonary findings.  3. She had an MRI and MRA of the brain done on June 19, 2007, which      showed no acute infarct, mild cerebellar tonsillar ectropia, and no      abnormal intracranial enhancing lesion.  The MRA showed an 8.6 x      6.1 mm left internal carotid artery cavernous segment aneurysm,      which could have contributed to the patient's diplopia.  4. She had an angiogram done on June 20, 2007, which shows an 8.8 x      7 mm saccular aneurysm arising from the superior hypophyseal region  of the left internal carotid artery intracranially.   PROBLEM LIST:  Problem 1.  ACUTE DIPLOPIA:  [name] was admitted with 3-4 days of  diplopia.  Her initial CT scan shows an infarct but an MRI done  subsequently showed that she did not have an infarct and so she did not  have a CVA.  We consulted neurology, Dr. Sharene Wise.  Dr. Sharene Wise was  initially concerned if she had myasthenia gravis but since she has  improved, we think it is primarily due to her cavernous sinus aneurysm.  At this time her diplopia has completely resolved.  I have advised her  to follow up with Dr. Sharene Wise if the diplopia recurs.   Problem 2.  CAVERNOUS SINUS CAROTID ANEURYSM:  She had an MRA which  showed an 8 x 7 mm aneurysm in the left internal carotid artery at the  region of the cavernous sinus.  This could be the cause of her diplopia  as this can compress on her cranial nerves.  We consulted interventional  radiology, Dr. Corliss Wise, who performed an angiogram, and at this point  of time she is being scheduled for coiling of her aneurysm on August 20  at 6 a.m.  She will be put on aspirin and Plavix at this point of time.  We also consulted  neurosurgery.  Dr. Jeral Wise saw the patient, who agreed  with the plan that coiling is the best way to approach this lesion.   Problem 3.  HYPERTENSION:  Her blood pressure has been stable.  We will  continue her on the same medication.   Problem 4.  HISTORY OF DIABETES MELLITUS:  Her sugars have been in good  control.  Will continue on a diabetic diet at home.  She is not on any  antidiabetic medications.   DISPOSITION:  At this time she is being discharged home in a stable  condition.  She has to come back on the 20th to get her aneurysm coiled  by Dr. Corliss Wise.  I have also advised her to follow up with Dr.  Sharene Wise if her diplopia recurs.  She is asked to follow up with her  primary care doctor in 2 weeks' time.  We did order an echocardiogram on  admission as she had a presumptive diagnosis of stroke at that time.  Her echo results are still pending.  This can be followed up by Dr.  Oneta Wise as an outpatient.      Melanie Savage, MD  Electronically Signed     PKN/MEDQ  D:  06/22/2007  T:  06/22/2007  Job:  [id number]   cc:   [name], [name]  [name], [name]  [name], [name]

## 2011-03-25 NOTE — H&P (Signed)
NAMEJACCI, RUBERG NO.:  [id number]   MEDICAL RECORD NO.:  [id number]          PATIENT TYPE:  OIB   LOCATION:  3172                         FACILITY:  [facility]   PHYSICIAN:  Sanjeev K. Deveshwar, M.D.DATE OF BIRTH:  [birth year]   DATE OF ADMISSION:  06/30/2007  DATE OF DISCHARGE:                              HISTORY & PHYSICAL   CHIEF COMPLAINT:  Cerebral aneurysm.   HISTORY OF PRESENT ILLNESS:  This is a very pleasant 77 year old female,  who was admitted to [facility] recently from June 19, 2007, to  June 22, 2007, with symptoms of diplopia and dizziness times three to  four days.  A CT scan was performed in the emergency room, which showed  an acute infarct in the left internal capsule and left frontal area.  The patient was admitted for further evaluation.  An MRI/MRA of the  brain, performed on August 9, showed no acute infarct, but there was  mild cerebellar tonsillar ectropia and no abnormal intracranial  enhancing lesion.  The MRA did reveal an 8.6 by 6.1 mm left internal  carotid artery cavernous segment aneurysm, which was felt to have  possibly contributed to the patient's symptoms.  Dr. Corliss Skains was asked  to see the patient and on August 10, she had a cerebral angiogram  performed, which did confirm the 8.8 by 7 mm saccular aneurysm, arising  from the superior hypophyseal region of the left internal carotid artery  intracranially.   While in the hospital, Dr. Corliss Skains met with the patient and her  family.  Treatment options were discussed for the aneurysm, including  continued monitoring, open craniotomy or endovascular coiling and/or  stenting.  The procedures were described in detail, along with the risks  and benefits.  The risk for the endovascular treatment include, but are  not limited to, bleeding, infection and stroke.  The patient decided  that she wanted to proceed with intervention.  She was discharged from  the hospital on  June 22, 2007, on aspirin and Plavix, along with her  other medications.  She returned to [facility] today, June 30, 2007, to undergo endovascular treatment of her aneurysm.   PAST MEDICAL HISTORY:  Significant for hypertension, diabetes mellitus  controlled by diet, gastroesophageal reflux disease, hyperlipidemia, a  recent CVA on June 19, 2007; a history of an irregular heart rate, a  history of depression, history of osteoporosis and osteopenia.   SURGICAL HISTORY:  Significant for three right knee surgeries, one total  knee replacement, which became infected and required removal, with  subsequent second replacement.  She has had bilateral iridectomies,  hysterectomy, appendectomy, and tonsillectomy.  The patient denies any  previous problems with anesthesia.   MEDICATIONS INCLUDE:  Xanax, aspirin, Plavix, Lipitor, Lasix, atenolol,  verapamil, mexiletine, Zantac, Effexor, Fosamax and vitamin D.   ALLERGIES:  She is ALLERGIC TO CODEINE.   SOCIAL HISTORY:  Patient is married.  She has a very supportive family.  She does not use alcohol or tobacco.   FAMILY HISTORY:  Her mother died of Alzheimer's and breast cancer.  Her  father died of cancer of the prostate.  She has brothers and sisters  with hypertension, heart disease, but no CVAs.   REVIEW OF SYSTEMS:  Completely negative, except for headaches, which  have been, for the most part, constant since discharge from the  hospital.  She has a history of mild arthritis.  She reports that she  bruises easily.  She has had generalized weakness since her hospital  admission.   LABORATORY DATA:  An INR is 0.9, a PTT is 29.  Hemoglobin 12.2,  hematocrit 35.9, WBC 7.9, platelets 299,000.  BUN 20, creatinine 0.85,  GFR greater than 60, potassium 3.9, glucose 134.   PHYSICAL EXAM:  Reveals a very pleasant, 77 year old white female, in no  acute distress.  VITAL SIGNS:  Blood pressure 134/78, pulse 59, respirations 19,   temperature 96.7, oxygen saturation 94% on room air.  HEENT:  Unremarkable.  NECK:  Revealed no bruits.  HEART:  Revealed regular rate and rhythm with distant heart sounds.  LUNGS:  Clear.  ABDOMEN:  Nontender.  EXTREMITIES:  Revealed pulses to be weak to absent with trace edema.  Airway was rated at a 2.  Her ASA scale was at 3.  NEUROLOGICAL EXAM:  Mental status:  The patient was alert and oriented,  follows commands.  Cranial nerves II through XII are grossly intact.  Sensation intact to light touch.  Motor strength 5/5 throughout.  Cerebellar testing was intact.   IMPRESSION:  1. 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  2. Diplopia, which resolved.  3. Hypertension.  4. Gastroesophageal reflux disease.  5. Hyperlipidemia.  6. Diabetes mellitus, controlled by diet.  7. Recent CVA, as noted above.  8. History of an irregular heartbeat, details unknown.  9. History of depression.  10.Osteoporosis.  11.Status post multiple surgeries.  12.CODEINE ALLERGY.   PLAN:  As noted, the patient has already undergone a cerebral angiogram.  The plan today will be to proceed with endovascular treatment of the  aneurysm under general anesthesia, to be performed by Dr. Corliss Skains.      Delton See, P.A.    ______________________________  Grandville Silos. Corliss Skains, [name]    DR/MEDQ  D:  06/30/2007  T:  06/30/2007  Job:  [id number]   cc:   [name], [name]  Deanna Artis. Sharene Skeans, [name]  Hilda Lias, [name]

## 2011-03-28 NOTE — Op Note (Signed)
NAMEKATHLENE, YANO NO.:  [id number]   MEDICAL RECORD NO.:  [id number]          PATIENT TYPE:  AMB   LOCATION:  [facility]                         FACILITY:  [facility]   PHYSICIAN:  Deidre Ala, [name]    DATE OF BIRTH:  05-Nov-1933   DATE OF PROCEDURE:  03/18/2007  DATE OF DISCHARGE:                               OPERATIVE REPORT   PREOPERATIVE DIAGNOSIS:  Severe crossover deformities, angular  deformities left second and third toe with second hammertoe and claw toe  distal interphalangeal joint/proximal interphalangeal joints.  Status  post previous bunionectomy and hammertoe procedure done elsewhere.   PROCEDURE:  1. Left second toe third metatarsal neck osteotomy.  2. Left second toe hammertoe resection DuVries procedure at PIP joint.  3. Left second toe DuVries claw toe resection DIP joint.  4. Left second toe proximal phalangectomy for overlapping toe      deformity.  5. Left second toe capsulotomy with extensor tenorrhaphy.  6. Left third toe third dorsal wedge osteotomy at neck.  7. Left third toe proximal phalangectomy for crossover overlap      deformity.  8. Left third toe capsulotomy and tenorrhaphy of MTP joint extensor      tendon.  9. Use of intraoperative fluoro with pinning.  10.Injection of midfoot with cortisone and Marcaine tarsometatarsal      joint for collapsing arch arthritis.   SURGEON:  Doristine Section, [name]   ASSISTANT:  Phineas Semen, PA-C.   ANESTHESIA:  General with LMA.   CULTURES:  None.   DRAINS:  None.   ESTIMATED BLOOD LOSS:  Minimal.  Esmarch tourniquet at ankle is 1 hour.   PATHOLOGIC FINDINGS AND HISTORY:  Ruther had previous podiatric surgery  with a bunion correction resulting in severe angular deformities of the  second and third toe at the MTP joint with a deformed metatarsal head  and proximal phalanx joint.  In addition, she had a left second claw and  hammertoe with flexion and ankylosis of the DIP and PIP joints.   She had  a tight dorsal wound on the MTP joint and extensor tendons II and III  and she had metatarsalgia second and third metatarsal heads with over  long second and third metatarsals.  She is a diabetic.  She is fully  aware of the risks involved with this sort of surgery, but the toes were  very, very bothersome to her.  We gave her prophylactic antibiotics and  will continue her on Cipro throughout the perioperative course.  We used  C-arm fluoroscopy to line up retrograde pin 0.62 K-wires from distal  proximal including the second dorsal wedge and third dorsal wedge  osteotomy with excellent position and alignment obtained at closure.  Overall shortening of the column and angular correction of flexion of  the DIP and PIPs of the second toe.  It was not necessary on the third.  Proximal phalangectomy was equivalent to a Keller type procedure and  allowed [place] to realign longitudinally the second and third toe on the  metatarsals.  We also decreased pressure on the bottom  of the foot with  a dorsal wedge osteotomy.  The pins were bent and capped and she was  placed in a wooden sole shoe.   PROCEDURE:  With adequate anesthesia obtained using LMA technique, 1  gram Ancef given IV prophylaxis, the patient was placed in the supine  position.  The left lower extremity was prepped from toes to the knee in  the standard fashion.  After standard prepping and draping, Esmarch  exsanguination was used above the ankle.  Her old incision over the  second toe MTP joint was then opened with a proximal medial based limb.  I also did a dorsal skin wedge resection as per the DuVries procedure on  the DIP and PIP joints resecting the dorsal tendons, the joints in a  wedge shaped fashion with a bone cutter and releasing the flexor tendon  with a 64 Beaver blade.  I then did the proximal phalangectomy of the  second toe and did the dorsal wedge injection of the metatarsal neck in  order to get it realigned  and compressed it.  I then placed the 0.62 K-  wire from the proximal phalanx distal, pulled it out and then put the  toe in the alignment I wanted and then pinned it retrograde across the  dorsal wedge osteotomy into the second metatarsal with overall excellent  alignment and extension of the DIP and PIP to a straight or physiologic  position.  I then on the third toe did the phalangeal base resection  with a longitudinally based incision, dorsal wedge osteotomy of the  third and did tenorrhaphy and capsulotomy on both MTP joints.  Similar  pinning was then carried out.  C-arm fluoroscopy confirmed positioning.  Irrigation was carried out.  All wounds were then closed with 4-0 nylon  interrupted.  The pins were then bent and cut and capped.  A bulky  sterile compressive forefoot dressing was applied.  The tourniquet had  been let down and no excessive bleeding was noted and all the flaps  pinked up.  The patient then having tolerated the procedure well was  awakened and taken to the recovery room in satisfactory condition to be  sent home with elevation, walking on her heel only, limited motion and  Cipro for antibiotics.  Laboratory data within normal limits.  She will  come back to the office on Monday for recheck.           ______________________________  V. Charlesetta Shanks, [name]     VEP/MEDQ  D:  03/18/2007  T:  03/18/2007  Job:  [id number]

## 2011-03-28 NOTE — Op Note (Signed)
[name], BOURN NO.:  [id number]   MEDICAL RECORD NO.:  [id number]          PATIENT TYPE:  INP   LOCATION:  1509                         FACILITY:  [facility]   PHYSICIAN:  Deidre Ala, [name]    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   PROCEDURE:  Right knee removal of total knee prosthesis and cement with  debridement, irrigation, and placement of antibiotic cement spacer.   SURGEON:  Dr. Doristine Section   ASSISTANTS:  Clarene Reamer, PA-C   ANESTHESIA:  General endotracheal.   CULTURES:  Taken of the synovium.   DRAINS:  One large Hemovac.   ESTIMATED BLOOD LOSS:  150 mL, replacement without.   TOURNIQUET TIME:  1 hour 10 minutes.   PATHOLOGIC FINDINGS AND HISTORY:  [name] is a 77 year old healthy female,  who underwent total knee arthroplasty on February 01, 2004, basically 2 years  and 9 months ago.  She basically has done well.  She had 1 course of a  Baker's cyst, some swelling, recently has had some back and leg pain.  She  never had any pain in her pain, no pain when she walked, but just had some  calf pain.  We got knee x-rays and on retrospect, there may have been some  tibial loosening on that point.  We got a Doppler done which revealed no  Baker's cyst in the posterior knee.  She came here today with a little more  knee swelling and redness.  She has not been running temperatures.  We  aspirated and got pure pus.  White blood cell count was 96,250, neutrophils  95, no crystals, no organisms seen on the Gram stain.  At surgery, this was  the first thing we got out was hematoma, but it looked clearly infected with  some darkening, almost metalosis around the prosthesis.  We removed all  the  cement, all the metalosis, all the synovitis, and paired down bone on the  tibia and the patella back to normal, bleeding-appearing bone and did a  thorough debridement of all abnormal-looking tissue.  We then placed a step-  cut drill down both canals and placed a dowel the size of a cement gun on  both ends to go up the canal then one that traversed the joint and then  placed a large 3 bag cement bolus in the knee and molded it without having  it stick to the bone but molded it as an antibiotic spacer with the knee in  full extension and traction.  Please note that the 3 bag cement spacer used  the DePuy cement with 3 doses of tobramycin 1.2 g each in the dowels.  Down  the canal was 2 bags of cement for a total of 5 bags, but those 2 bags had  only 1.2  g of tobramycin for a total of 4.8 g of tobramycin in the patient.  This was on the advisement of Dr. Cliffton Asters, even though there is some  new data out from Dr. Jeannett Senior of [place], [place], about using high dose  antibiotics to the level of 6-10 g of tobramycin per bag, we felt it was  most appropriate in this locale at this time to use what is considered to be  the standard of care and from an article at [facility] on 1.2 g per  batch.  She will be placed on IV vancomycin pending cultures intravenously.  We did a single layer closure on the deep layers and then oversewed the skin  with 2-0 nylon, and our hope is to get a sterilized wound and then go back  and reimplant with antibiotic cement.   PROCEDURE:  With adequate anesthesia obtained using endotracheal technique,  no antibiotics given preoperatively, the patient was placed in the supine  position.  The right lower extremity was prepped from the toes to the  tourniquet in a standard fashion.  After standard prepping and draping,  Esmarch exsanguination was used.  The tourniquet was let up to 350 mmHg.  I  then excised the old scar, made an incision down to the  medial retinaculum  with a flap over the patella and then entered the joint.  Bloody hematoma  was removed.  The patella was everted.  Meticulous dissection around the  prosthesis was then carried out.  I used an osteotome to amputate the  rotating platform stem.  I then used osteotomes to remove the tibial  prosthesis, osteotomes to remove the patellar button, and then a saw and  osteotomes to remove the femur.  I then meticulously with rongeur and saw  removed all cement and discolored bone on the tibia, used a reversed curette  and osteotomes to remove the cement down the peg hole and then drilled with  a step-cut drill to allow for the antibiotic dowels down the canals.  I then  further debrided the posterior patella with a saw.  Thorough jet lavage was  then carried out with saline with pulsatile lavage.  We then put cement in  the 2 batches in the cement gun and used the long tube of the cement gun.  I  pushed out the cement, having coated it with mineral oil, pushed it out, let  it harden, made the dowels, and made 3 dowels, 2 short ones and 1  intermediate long one.  The 2 short ones about 3 inches down each canal  which we can retrieve later with a large pituitary and the 1 long one  traversing the joint line.  We then mixed the next cement with the  tobramycin and placed it in the __________ in the cavity of the joint with  the knee in traction and full extension.  I then let this harden but not  harden enough that it was not still mobile slightly.  It did allow for a  locking position in full extension.  The tourniquet was let down.  Bleeding  points were cauterized.  In the medial gutter, we placed 1 limb of a large  Hemovac drain out the superolateral portal and later hooked it to suction.  A single layer closure was used with #1 Ethilon over red Roxan Hockey bolsters,  and these were vertical mattress sutures.  This was interposed with vertical mattress sutures of #1 Ethilon and  then oversewn on the  skin with running 2-  0 Ethilon.  A bulky sterile compressive dressing was applied.  Hemovac was  hooked up to Autovac, the knee in full extension and the patient, having  tolerated the procedure well, was awakened and taken to the recovery room in  satisfactory condition for routine postoperative care and IV antibiotics and  infectious disease consult and analgesia.           ______________________________  V. Charlesetta Shanks, [name]     VEP/MEDQ  D:  10/24/2005  T:  10/27/2005  Job:  [id number]   cc:   Cliffton Asters, [name]  Fax: [phone]   Lacretia Leigh. Ninetta Lights, [name]  Fax: [phone]

## 2011-03-28 NOTE — Discharge Summary (Signed)
NAME:  [name], [name] NO.:  [id number]   MEDICAL RECORD NO.:  [id number]                   PATIENT TYPE:  INP   LOCATION:  5035                                 FACILITY:  [facility]   PHYSICIAN:  Deidre Ala, [name]                 DATE OF BIRTH:  1933/03/28   DATE OF ADMISSION:  02/01/2004  DATE OF DISCHARGE:  02/06/2004                                 DISCHARGE SUMMARY   ADMISSION DIAGNOSIS:  End-stage degenerative joint disease right knee.   DISCHARGE DIAGNOSIS:  End-stage degenerative joint disease right knee,  status post right total knee arthroplasty.   HOSPITAL COURSE:  The patient was brought to [facility] and taken  to the operating room on February 01, 2004, for a right total knee  arthroplasty.  This was performed under general anesthesia with a tourniquet  time of 1 hour 34 minutes and estimated blood loss of less than 100 mL, done  under general endotracheal anesthesia with a postoperative femoral nerve  block. The wound was closed over two medium Hemovac drains attached to an  Autovac.  She was taken to the recovery room in stable condition on  postoperative day #1, February 02, 2004.  She was doing well, no real  complaints.  Vitals were stable.  She was afebrile.  The wound was benign,  no signs of infection.  The drain was working.  Her lungs were clear.  Pain  was well controlled with PCA anesthesia.  On postoperative day #2, February 03, 2004, she was taking p.o. and voiding without difficulty.  Pain was  reasonably well controlled.  The wound was benign.  The dressings were  changed and the drains were discontinued.  Her sugars had been running kind  of high, she was placed on some sliding scale with better results.  PT, OT,  and rehab had been consulted.  They were saying that she would be able to go  home with home health PT if she continued to progress on February 04, 2004,  postoperative day #3.  She was comfortable, able to get out  of bed to the  bathroom, was walking without difficulty.  Her vitals were stable.  She was  afebrile.  The dressing was clean and dry.  On postoperative day #4, March  28, she was without complaints.  She was in the CPM in the morning.  She had  done some ambulating, but not out in the hall as of yet.  She had no nausea,  taking p.o., and voiding without difficulty, but felt she was not ready to  go home. Vitals were stable. She was afebrile, dressings were  clean and  dry, calf was soft and nontender, neurovascularly intact.  Her home DME was  ordered.  On February 06, 2004, she said she was ready to go home. She was  without complaints.  Taking p.o.  and voiding without difficulty.  No nausea.  She had been ambulating in the hall the day before and early that morning  without difficulty.  Vitals were stable.  Maximum temperature was 100.2.  The right knee was benign.  Dressing was clean and dry.  Calf soft and  nontender, neurovascularly intact.  PT was 24.6 and INR was 3.2.   ASSESSMENT:  Postoperative day #5 right total knee arthroplasty.   PLAN:  Discharge home.   ACTIVITY:  Weightbearing as tolerated on the right knee with knee  immobilizer and walker until cleared by therapy.   WOUND CARE:  Keep clean and dry.  She may shower when she gets home and  should do daily dressing changes until her return office visit.   DIET:  As tolerated, diabetic diet.   DISCHARGE MEDICATIONS:  1. Mexitil 150 mg one daily.  2. Tenormin 100 mg one daily.  3. Calan SR 240 mg one daily.  4. Lasix 40 mg one daily.  5. Lipitor 20 mg one daily.  6. Estrace 0.5 mg one daily.  7. Effexor 75 mg one daily.  8. Neurontin 300 mg one p.o. t.i.d.  9. Fosamax 70 mg one weekly.   New medications include:  1. Percocet 325/5 mg one to two p.o. q.4-6h p.r.n. for pain #50 with no     refills.  2. Robaxin 750 mg one p.o. q.6-8 hours p.r.n. for spasm or cramp #40 with     two refills.  3. Coumadin 2.5 mg one  daily until told to change dose by home health     nurse/pharmacist per [facility] #60 plus one refill.   FOLLOW UP:  Dr. Renae Fickle on Monday, February 12, 2004.  She is instructed to call  our office for appointment.  Home health physical therapy.  Home health  nursing per [facility].   CONDITION ON DISCHARGE:  Stable.      Madilyn Fireman, [name].                       Deidre Ala, [name]    AC/MEDQ  D:  02/06/2004  T:  02/06/2004  Job:  [id number]

## 2011-03-28 NOTE — Discharge Summary (Signed)
NAMEWILLYE, [name] NO.:  [id number]   MEDICAL RECORD NO.:  [id number]          PATIENT TYPE:  INP   LOCATION:  5014                         FACILITY:  [facility]   PHYSICIAN:  Deidre Ala, [name]    DATE OF BIRTH:  11-06-1933   DATE OF ADMISSION:  01/08/2006  DATE OF DISCHARGE:  01/13/2006                                 DISCHARGE SUMMARY   ADMISSION DIAGNOSES:  1.  Status post removal of hardware from right total knee arthroplasty due      to infection.  2.  Diabetes mellitus.  3.  Hypertension.  4.  Hypercholesterolemia.   DISCHARGE DIAGNOSES:  1.  Status post removal of hardware from right total knee arthroplasty due      to infection, now status post right total knee revision and re-      implantation.  2.  Diabetes mellitus.  3.  Hypertension.  4.  Hypercholesterolemia.  5.  Postoperative hemorrhagic anemia, stable at the time of discharge.   PROCEDURE:  The patient was taken to the operating room, on January 08, 2006,  and underwent right total knee revision, DePuy LTS.  Surgeon was Drema Pry, [name]  Assistant was [employer], [name].  Surgery was done  under general anesthesia with a femoral nerve block and a Hemovac drain x1  was placed at the time of surgery.   CONSULTATIONS:  1.  Physical therapy.  2.  Occupational therapy.  3.  Social work.  4.  Case management.  5.  Pharmacy for Coumadin and vancomycin protocol.   BRIEF HISTORY:  The patient is a 77 year old female who is status post right  total knee arthroplasty.  She presented to the office with an increasing  amount of pain for a few days.  She was aspirated in the office which showed  purulent material in the right knee.  She was then taken to the operating  room, underwent removal of hardware with antibiotic spacer, was placed on IV  antibiotics.  After two months, blood work was all normal for no signs of  infection.  She was then taken back to the operating room and  underwent a  right total knee revision by Dr. Charlesetta Shanks.   LABORATORY DATA:  CBC, on admission, showed a hemoglobin of 11., hematocrit  of 32.8, white blood cell count 6.7, red blood cell count 3.6.  Serial H&H  followed throughout hospital stay, hemoglobin and hematocrit did decline to  8.1 and 24.2 on January 21, 2006, but were back up to 12.6 and 37.8  respectively, status post 2 units of packed red blood cells.  Differential  on admission was all within normal limits.  Coagulation studies on admission  were all within normal limits.  PT and INR at the time of discharge were  23.2 and 2.0 respectively on Coumadin therapy.  Routine chemistry on  admission showed sodium low at 134.  Serial chemistries were taken  throughout hospital stay.  Glucose ranged from a low of 93 to a high of 136.  Sodium returned to the normal range  on January 09, 2006.  It fell back low  again to 134 on January 11, 2006 but was stable at the time of discharge.  A  urinalysis on admission was all within normal limits.  The patient's blood  type is O positive with antibody screen negative.  EKG showed sinus  bradycardia was an otherwise normal ECG.  Radiographs, 2-view of the chest  showed no active disease.  A chest x-ray taken, on January 12, 2006, showed  right PICC line in the SVC.   HOSPITAL COURSE:  The patient was taken to the operating room, on January 08, 2006, underwent the above stated procedure.  The patient tolerated the  procedure well and there were no complications.  She was allowed to return  to the recovery room on orthopedic floor for continued postoperative care.  Postop day #1:  The patient is resting comfortably.  She is afebrile.  Hemoglobin and hematocrit 9.0 and 26.4.  She is neurovascularly intact.  The  right lower extremity dressing was clean, dry and intact.  The patient was  working with physical therapy and occupational therapy.  Postop day #2:  Hemoglobin was 9.1, INR was 1.6.  T-max was  99.3.  Incision was clean, dry  and intact.  Hemovac was discontinued on this date.  The patient was to  continue working with physical therapy and occupational therapy.  Postop day  #3:  The patient was doing well.  Hemoglobin fell to 8.  She was then setup  for 2 units of packed red blood cells.  She continued her CPM, continued  working with physical therapy and occupational therapy.  Postop day #4:  The  patient was complaining of some nausea.  T-max was 99.4.  The incision was  clean, dry and intact.  She was doing work with physical therapy and  occupational therapy and planned to be discharged home the following day,  January 13, 2006.  Postop day #5:  The patient was doing well, ready for  discharge.  Hemoglobin and hematocrit were 12.6 and 37.8.  Incision was  clean, dry and intact.  She was afebrile.  She will be discharged home on  January 13, 2006.   DISPOSITION:  The patient is discharged home on January 13, 2006.   DISCHARGE MEDICATIONS:  1.  Mepergan fortis, #50, 1 to 2 p.o. q.4-6h. p.r.n. pain.  2.  Coumadin 5 mg p.o. every day and per pharmacy protocol.  3.  Robaxin 500 mg, #40, 1 p.o. q.6-8h. p.r.n.  4.  Vancomycin 1250 mg IV q.12h. x6 weeks.   ACTIVITY:  The patient is weightbearing as tolerated to the right lower  extremity.   WOUND CARE:  The patient is to perform daily dressing changes until no  drainage.  She may shower when no drainage.   DIET:  Previous diabetic diet.   FOLLOWUP:  The patient is to follow up with Dr. Renae Fickle in two weeks from the  date of surgery.  She is to call the office for an appointment at [phone].   CONDITION ON DISCHARGE:  Stable and improved.      Clarene Reamer, [name].    ______________________________  Seth Bake. Charlesetta Shanks, [name]    SW/MEDQ  D:  01/13/2006  T:  01/14/2006  Job:  [id number]

## 2011-03-28 NOTE — Op Note (Signed)
NAMEJAILINE, LIEDER NO.:  [id number]   MEDICAL RECORD NO.:  [id number]          PATIENT TYPE:  AMB   LOCATION:  [facility]                         FACILITY:  [facility]   PHYSICIAN:  Deidre Ala, [name]    DATE OF BIRTH:  05-01-1933   DATE OF PROCEDURE:  12/31/2006  DATE OF DISCHARGE:                               OPERATIVE REPORT   PREOPERATIVE DIAGNOSES:  1. 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  2. Left index finger cyst A1 pulley.   POSTOPERATIVE DIAGNOSES:  1. 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  2. 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  3. Solid mass left ring finger A1 pulley 5 x 5 mm.   PROCEDURE:  1. Excision left ring finger A1 pulley volar retinacular ganglion cyst      and excision small solid mass excision A1 pulley 5 x 5 mm.  2. Left index mass excision from A1 pulley, probable hemangioma.   SURGEON:  Doristine Section, MD   ASSISTANT:  Phineas Semen, PA-C.   ANESTHESIA:  General with LMA.   CULTURES:  None.   DRAINS:  None.   ESTIMATED BLOOD LOSS:  Minimal.   TOURNIQUET TIME:  25 minutes.   PATHOLOGIC FINDINGS AND HISTORY:  Naima is an old patient of mine, a 77-  year-old with a painful volar ganglion cyst A1 pulley left ring finger  without triggering.  The left index also had a mass that was mildly  painful she desired removed at the same time.  At surgery, the left ring  finger A1 pulley mass was a combination of a volar ganglion cyst  measuring 5 x 5 mm and a solid mass of the ganglion that what we cut it  off the table was solid A1 pulley and it was excised with the A1 pulley  which was released also.  It did not have malignant features.  It may  have been a compressed ganglion cyst, but it was sent for specimen.  The  left index mass was a small vascular either thrombosed vein or  hemangioma measuring 5 x 5 mm.  There was thickening of the A1 pulley  otherwise, and that thickening  was excised.   PROCEDURE:  With adequate anesthesia obtained using LMA technique, 1 g  Ancef given IV prophylaxis, the patient was placed in the supine  position.  The left hand was prepped from the fingertips to the upper  forearm in the standard fashion.  After standard prepping and draping,  Esmarch exsanguination was used.  The tourniquet was let up to 250 mmHg.  I then made an incision in the distal palmar flexion crease of the left  4th finger at the A1 pulley.  Incision was deepened sharply and adequate  hemostasis obtained using the Bovie electrocoagulator.  Dissection was  carried out under loupe magnification to the ganglionis mass which was  excised.  I then felt additional mass solid on the A1 pulley which I  excised completely and released the A1 pulley.  Irrigation was carried  out.  I  then turned attention to the left index where a similar incision  was made over the mass, dissection carried down.  We localized the area  palpable and excised it.  It was vascular, probably vein or hemangioma.  It was removed.  The tourniquet was let down.  Bleeding points were  cauterized in both wounds, irrigation, and the wounds were closed with 4-  0 nylon.  A bulky sterile compressive minor hand-type dressing was  applied.  Some Marcaine was placed around the wound, 0.5% plain.  The  patient, having tolerated the procedure well, was awakened and taken to  the recovery room in satisfactory condition to be discharged per  outpatient routine, given Vicodin for pain and told to call the office  for appointment for recheck on Monday.  Laboratory data within normal  limits.           ______________________________  V. Charlesetta Shanks, [name]     VEP/MEDQ  D:  12/31/2006  T:  12/31/2006  Job:  [id number]

## 2011-03-28 NOTE — Op Note (Signed)
NAME:  [name], [name] NO.:  [id number]   MEDICAL RECORD NO.:  [id number]                   PATIENT TYPE:  INP   LOCATION:  5035                                 FACILITY:  [facility]   PHYSICIAN:  Deidre Ala, [name]                 DATE OF BIRTH:  [birth year]   DATE OF PROCEDURE:  02/01/2004  DATE OF DISCHARGE:                                 OPERATIVE REPORT   PREOPERATIVE DIAGNOSIS:  End-stage degenerative joint disease, right knee.   POSTOPERATIVE DIAGNOSIS:  End-stage degenerative joint disease, right knee.   PROCEDURE:  Right total knee arthroplasty using cemented DePuy components  with rotating platform and LCS type.   SURGEON:  Bradley Ferris, [name]   ASSISTANT:  Madilyn Fireman, [name].   ANESTHESIA:  General endotracheal with postop femoral nerve block.   CULTURES:  None.   DRAINS:  Two medium Hemovacs to Autovac.   ESTIMATED BLOOD LOSS:  Less than 100 mL.   BLOOD REPLACED:  Without.   TOURNIQUET TIME:  1 hour 34 minutes.   PATHOLOGIC FINDINGS AND HISTORY:  [name] is a pleasant 77 year old female  referred through the courtesy of [name], [name], for knee pain.  We  saw her first November 22, 2003.  She had bone on bone with tricompartment  disease and a Baker's cyst.  Also she had MRI scan showing Baker's cyst and  tricompartment osteoarthritis with some meniscal fraying, and she elected to  proceed with injection in hopes of avoiding a total knee arthroplasty.  She  initially did well but recurred.  She had to have another injection by  December 20, 2003.  She came back in, was making some progress.  Her back was  hurting, that cleared, and then her knee continued to bother her to the  point that she finally said enough was enough, and she decided to proceed  with surgical intervention.  She was cleared for surgery by Dr. Oneta Rack.  At  surgery tricompartment disease was noted and we were able to get excellent  fit and fill with a  standard femur, a size standard rotating platform, 12.5  mm insert height, with an oval dome patella, three-peg, and a standard right  femur, and two batches of cement with Zinacef.  We had full extension with  flexion to 110 degrees, stable to stressing, and good patellofemoral  tracking.   PROCEDURE:  With adequate anesthesia obtained using endotracheal technique,  1 g Ancef given IV prophylaxis and another one at tourniquet let-down, the  patient was placed in the supine position.  The right lower extremity was  prepped from the toes to the tourniquet in a standard fashion.  After  standard prepping and draping, Esmarch exsanguination was used and the  tourniquet was let up to 350 mmHg.  A median parapatellar skin incision was  followed by a median parapatellar retinacular incision.  Incision was  deepened sharply with a knife with hemostasis obtained using the Bovie  electrocoagulator.  The patella was then everted and the fat pad excised as  well as both menisci and the cruciates.  We then amputated the anterior  tibial spine and placed the tibial cutting jig in place and set it and made  our first cut, feeling it would be a bit tight, and actually it was tight  when we tried the C clamp.  We later cut 5 mm more.  I then sized the femur  to a standard, placed our standard anterior-posterior, chamfer cutting jig  with the intramedullary guide, set it, and fit it to a 12.5, pinned it, then  made our anterior-posterior cuts.  It was tight with the 10.  We had  actually then cut the tibia 2.5 more and it fit the 12.5 in flexion.  We  then placed the 4 degrees distal valgus cutting jig in place.  We made that  cut, and we felt it fit best with the 12.5 in extension.  We then placed the  anterior-posterior, chamfer cutting jig in place and notch-cutting jig and  made those cuts.  We then exposed the proximal tibia and sized to a 2,  placed the tower for the peg hole, made that cut, and then  placed the  central peg and broached for the keel.  We then placed the trial 12.5 insert  on the tibial tray.  We impacted on the femoral component and articulated  the knee through a range of motion.  I then sized the patella to a 19, cut  down to replace 10, placed the three holes, and then drilled them and placed  the patella in place for trial.  It tracked well.  This was the 35 mm  button.  We then removed all trial components and jet-lavaged the knee while  the components were checked for sizing as they came on the field.  It should  be noted on the anterior-posterior, chamfer cutting jig, we did make the far  posterior cuts also.  We then exposed the proximal tibia, impacted on the  tibial component, and removed excess cement.  We then placed the rotating  platform in place.  We then cemented on the femoral component, impacted it,  and removed excess cement.  We then held the knee in full extension and  removed additional cement and then held it in 45 degrees of flexion until  the cement had cured.  We then cemented on the patellar component, impacted  it, removed excessive cement, and held it with a clamp until the cement had  cured.  We then thoroughly jet-lavaged the knee with the rest of the  irrigant after the tourniquet was let down, and bleeding points were  cauterized.  Hemovac drains were then placed in the medial and lateral  gutter and brought out the superior lateral portal.  The wound was then  closed in layers with #1 Vicryl on the retinaculum, 0 and 2-0 Vicryl on the  subcu, and skin staples.  A bulky sterile compressive dressing was applied  with Ace and the patient, having tolerated the procedure well, was awakened  and taken to the recovery room in satisfactory condition in a knee  immobilizer, to be started on CPM and routine postoperative analgesia.  Deidre Ala, [name]   VEP/MEDQ  D:  02/01/2004  T:  02/02/2004   Job:  [id number]   cc:   [name], [name]  [address], [address]  [place], [place] [address]  Fax: [phone]

## 2011-03-28 NOTE — Op Note (Signed)
NAMEKIMM, SIDER NO.:  [id number]   MEDICAL RECORD NO.:  [id number]          PATIENT TYPE:  INP   LOCATION:  1509                         FACILITY:  [facility]   PHYSICIAN:  Deidre Ala, [name]    DATE OF BIRTH:  [birth year]   DATE OF PROCEDURE:  DATE OF DISCHARGE:                                 OPERATIVE REPORT   ADDENDUM:   REFERRED BY:  [name], [name]           ______________________________  V. Charlesetta Shanks, [name]     VEP/MEDQ  D:  10/24/2005  T:  10/27/2005  Job:  [id number]   cc:   [name], [name]  Fax: [phone]

## 2011-03-28 NOTE — Op Note (Signed)
[name], NORDSTROM NO.:  [id number]   MEDICAL RECORD NO.:  [id number]          PATIENT TYPE:  INP   LOCATION:  5014                         FACILITY:  [facility]   PHYSICIAN:  Deidre Ala, [name]    DATE OF BIRTH:  12/18/1932   DATE OF PROCEDURE:  01/08/2006  DATE OF DISCHARGE:                                 OPERATIVE REPORT   PREOPERATIVE DIAGNOSIS:  Status post right knee infected total knee with  implant removal and placement of antibiotic spacer.   POSTOPERATIVE DIAGNOSIS:  Status post right knee infected total knee with  implant removal and placement of antibiotic spacer.   PROCEDURE:  Right knee revision total knee arthroplasty using stemmed  components, DePuy LCS type, with the rotating platform stabilized,  stabilized tibial insert, cemented with antibiotic cement, and removal of  cement spacer.  There was no reimplantation of the patella, as the patella  was too thin.   SURGEON:  1.  Charlesetta Shanks, [name]   ASSISTANT:  Clarene Reamer, [name].   ANESTHESIA:  General with LMA and femoral nerve block.   Cultures were taken with Gram stain negative, and we also took a frozen  section, which showed no evidence of any current infection.   ESTIMATED BLOOD LOSS:  150 mL.  Replaced:  Without.   TOURNIQUET TIME:  1 hour 53 minutes.   PATHOLOGIC FINDINGS AND HISTORY:  [name] index right total knee  arthroplasty with DePuy system LCS type was two years ago, February 01, 2004.  She did well until the fall of this year, came in October 24, 2005, with an  effusion that was clearly purulent.  We basically took her to the operating  room, removed the implants, put an en bloc cement spacer in with Tobramycin,  and had her on perioperative vancomycin and Rocephin.  Was treated as a  staph infection, although she never grew any organisms, and was placed on  vancomycin and then later had placed oral Bactrim.  Postoperatively her  wounds cleared.  She was at 10 weeks  out from removal on __________, and at  that point she was on oral Bactrim, she was afebrile, she had a white count  of 4000, a sedimentation rate of 7, and her CRP was at 0.3.  These  parameters had come down markedly from her perioperative state and we  therefore deemed her ready for reimplantation.  At surgery there was an  effusion, but there was no evidence of any purulence.  We sent a Gram stain  off which was negative and also tissue for white blood cells for high-power  field and organisms, and it was negative.  There was some sloping of the  tibia, but basically it was adequately controlled by a medial tibial wedge  of 15 mm.  We used stabilizing flute system on both the tibia and the femur.  We used 4+ augments on the distal femur and actually had excellent fit and  fill with a 10 mm rotating platform, posterior-stabilized type, with full  extension and flexion to about 95 degrees.  We  did use a #3 right nonporous  cruciate-substituting femoral component, a rotating platform stabilized,  tibial insert 10 mm, size #3.  We used a fluted rod on the femur of 18 x 75  mm with 4 mm augments on the distal femur.  We used on the tibial side a #3  with the step wedge size 2, 15 mm, and we used a fluted rod that measured 10  x 75 mm.  Neither side would accept the broach-type conical insert.  We had  excellent stability, rotational control, and the ligaments were extremely  stable to varus-valgus stressing throughout.  We used three batches of  cement with 1.2 g of Tobramycin per batch.  We used a 2 revision cemented  tray.   PROCEDURE:  With adequate anesthesia obtained using LMA technique, 1 g  vancomycin given IV prophylaxis, the patient was placed in the supine  position and the right lower extremity was prepped from the toes to the  tourniquet in the standard fashion.  After standard prepping and draping,  Esmarch exsanguination used, the tourniquet was let up to 350 mmHg.  The old   median parapatellar skin incision was then reincised along with the median  parapatellar retinacular incision.  The incision was deepened sharply with a  knife and hemostasis obtained using the Bovie electrocoagulator.  The  patella remnant was very thin, and we ultimately did not put a poly button  on because the bone was so thin.  We did do some soft tissue releases to  move the patellar tendon and quadriceps tendon out of the way and expose the  cement block, which we removed, morcellized with an osteotome, removing it.  Then there was a dowel proximal and distal of antibiotic cement down the  intramedullary canal, which I removed with a pituitary.  We then exposed the  proximal tibia, placed the intramedullary drill, and drilled up to a 10, set  the cutting jig in place, and felt that the best coverage was going to be  with a medial step-cut wedge measuring 15 mm.  We then set our cut for this  and made this cut to the center of the anterior tibial component.  We then  set the trial in place and it fit nicely with rotational control with the 90  degree cut centrally, anteriorly and then out medially.  We then turned  attention to the femur, where the intramedullary guide was put in place.  We  placed the distal femoral cutting jig in place and made a bare minimum cut,  then placed the anterior-posterior cutting jig in place with the appropriate  rotation, made the bare minimum cut for a 3.  We then made the chamfer cuts.  We then trialled the femur with the 18 mm stem and articulated the knee with  the 10 mm rotating platform stabilized tibial insert.  All trial components  were then removed as we jet-lavaged the knee.  Thorough debridement of any  additional soft tissues around the bone and gutters was done, but it was  actually quite clean, and jet lavage carried out.  The components were checked as they came on the field for sizing and assembled on the side  table.  We then mixed the  Tobramycin with the cement, 3.6 g or three doses  of Tobramycin 1.2 g for three batches of cement.  We then cemented on the  tibial component with the cement to the start of the flutes but not past  them and cemented the tibial component on, impacted and removed excess  cement.  We then cemented on the femoral component with the cement to the  flutes but not past on the stem, impacted it, removed excess cement,  impacted it, removed excess cement, and then held the knee in full  extension, further extruding cement and removing it.  We placed the trial  rotating platform for this.  We then implanted the implantable #3 rotating  platform to match the femur with the stabilizing component.  Please note  that prior to this, we had cut on the chamfer cut also the box to house the  posterior stabilizing component.  We then placed, as I said, the final  implant on the rotating platform posterior stabilizing lip and held the knee  in about 35 degrees of flexion until the cement cured.  Jet lavage was then  carried out again.  We had excellent stability with full extension, flexion  to about 95, stable to varus-valgus stressing, and ligaments intact, rock-  solid, with a 10.  We then let the tourniquet down, bleeding points were  cauterized.  The wound was then closed with running locking #1 PDS on the  retinaculum, 0 and 2-0 Vicryl on the subcu and skin  staples.  A bulky sterile compressive dressing was applied with knee  immobilizer.  The patient, having tolerated the procedure well, was awakened  and taken to the recovery room in satisfactory condition for routine  postoperative care, analgesia and CPM and IV vancomycin.           ______________________________  V. Charlesetta Shanks, [name]     VEP/MEDQ  D:  01/08/2006  T:  01/09/2006  Job:  [id number]   cc:   Lacretia Leigh. Ninetta Lights, [name]  Fax: [phone]

## 2011-03-28 NOTE — Discharge Summary (Signed)
NAMESHANEL, PRAZAK NO.:  [id number]   MEDICAL RECORD NO.:  [id number]          PATIENT TYPE:  INP   LOCATION:  1509                         [facility]AC[facility]   PHYSICIAN:  Deidre Ala, [name]    DATE OF BIRTH:  10/05/1933   DATE OF ADMISSION:  10/24/2005  DATE OF DISCHARGE:                                 DISCHARGE SUMMARY   ADMISSION DIAGNOSES:  1.  Infected right total knee arthroplasty.  2.  Diabetes mellitus.  3.  Hypertension.  4.  Hypercholesterolemia.   DISCHARGE DIAGNOSES:  1.  Infected right total knee arthroplasty, status post removal of hardware      implantation and antibiotic spacer.  2.  Diabetes mellitus.  3.  Hypertension.  4.  Hypercholesterolemia.  5.  Postoperative hemorrhagic anemia, stable at the time of discharge.   PROCEDURES:  The patient was taken to the operating room on October 24, 2005 and underwent removal of hardware of the right total arthroplasty with  implantation of antibiotic spacer.  Surgeon was Dr. Charlesetta Shanks, assistant  Clarene Reamer, PA-C.  Surgery was done under general anesthesia and a large  Hemovac drain x1 was placed at the time of surgery.   CONSULTATIONS:  Physical therapy, occupational therapy, social work case  management, infectious disease with Dr. Ninetta Lights and Dr. Orvan Falconer.   BRIEF HISTORY:  The patient is a 77 year old female who is about 2 years  status post right total knee arthroplasty.  She noticed the onset of pain 2  days prior to coming into the office on the 15th.  It was noted that she had  an effusion on her knee.  She was aspirated in the office, which is where  pus was aspirated.  At that time we decided to admit her to the hospital and  remove her right total knee arthroplasty.  Risks and benefits of the surgery  were discussed with the patient and the patient wished to proceed.   LABORATORY DATA:  CBC on admission showed a hemoglobin of 11.6, hematocrit  34.6, white blood cell count  12.7, red blood cell count 3.78.  Serial H&H  were followed throughout hospital stay.  Hemoglobin and hematocrit did  decline to 9.1 and 26.9 but were stable at the time of discharge.  White  blood cells were normal at 7.8 at the time of discharge.  Differential on  admission showed neutrophils high at 85, lymphocytes low at 8.  ESR on  admission was 70.  Coagulation studies on admission were all within normal  limits.  PT and INR at the time of discharge were 15.9 and 1.3 respectively  on Coumadin therapy.  Routine chemistry on admission showed potassium low at  2.8, glucose high at 124, BUN high at 26, and creatinine high at 1.5.  Serial chemistries were taken throughout hospital stay.  Potassium did rise  to 4.2 on December 18 and was stable at the time of discharge.  CO2 did rise  to 33 on December 18.  Glucose ranged from a low of 117 to a high of 135 on  December 18.  BUN fell into the normal range on December 16 and was low at 5  on December 17 and within normal range on December 18.  Creatinine returned  to the normal range.  It was stable throughout her hospital stay.  C-  reactive protein on admission was high at 20.3.  Urinalysis on admission was  all within normal limits.  The patient's blood type is O positive, with  antibody screen negative.  Cultures to date have shown no growth and no  organisms from a gram stain and tissue cultures from the right knee.  EKG on admission showed normal sinus rhythm with nonspecific T-wave  abnormality.  Preop x-rays, chest x-ray taken showed no active disease with  no interval change.  Postoperative x-ray after PIC placement showed PIC line  tip in superior vena cava.   HOSPITAL COURSE:  The patient was admitted to [facility] and taken  to the operating room.  She underwent the above stated procedure without  complications.  The patient tolerated the procedure well and returned to the  recovery room on the orthopedic floor for  continued postoperative care.  She  was seen by infectious disease, Dr. Ninetta Lights and Dr. Orvan Falconer who followed  along closely.  They were adjusting the medications appropriately until the  patient was stable for discharge.  Postoperative day #1, the patient was  resting comfortably, hemoglobin and hematocrit 9.2 and 27.6.  She was  afebrile.  Dressing was clean, dry and intact.  She was neurovascularly  intact to the right lower extremity.  PIC line placement was ordered.  She  was non-weight-bearing, on IV antibiotics.  December 17, postoperative day  #2, the patient was resting comfortably, T-max was 104, hemoglobin and  hematocrit were 9.1 and 26.9.  She was neurovascularly intact to right lower  extremity.  The patient was to continue her physical therapy and  occupational therapy on IV antibiotics.  PIC line placement was still to be  done and cultures were still negative at this time.  December 18,  postoperative day #3, the patient was doing well, afebrile.  Hemoglobin and  hematocrit 9.1 and 26.9.  Incision was clean, dry and intact.  She was to  continue IV Vancomycin and Rocephin.  Cultures remained negative.  PIC line  was still to be placed.  December 19, postoperative day #4, the patient was  resting comfortably, was okay for discharge from the infectious disease  standpoint as well.  T-max was 99.1.  Incision was clean, dry and intact and  the patient was subsequently discharged home today.  She will be discharged  home with home health R.N., physical therapy and occupational therapy, for  the R.N. to monitor Coumadin and do her IV antibiotics q.24 hours.   DISPOSITION:  The patient was discharged home on October 28, 2005.   DISCHARGE MEDICATIONS:  1.  Percocet 5/325 1-2  p.o. q.4-6h p.r.n. pain.  2.  Robaxin 500 mg, #40, one every 6-8h p.r.n. spasms.  3.  Vancomycin 1.5 grams IV q.24h x38 days.  4.  Rocephin 1 gram IV q.24h x38 days. 5.  Trimeton capsule 1 p.o. t.i.d.   6.  Coumadin per pharmacy protocol.   ACTIVITY:  The patient is non-weight-bearing to the right lower extremity  with a walker and/or a wheelchair.   WOUND CARE:  The patient to have daily dressing changes performed.   FOLLOW UP:  The patient is to follow up with Dr. Renae Fickle next Tuesday or  Wednesday.  She is to call our office for an appointment on [phone].   CONDITION ON DISCHARGE:  Stable and improved.     ______________________________  Clarene Reamer, [name].    ______________________________  V. Charlesetta Shanks, [name]    SW/MEDQ  D:  10/28/2005  T:  10/28/2005  Job:  [id number]

## 2011-07-16 ENCOUNTER — Encounter (HOSPITAL_COMMUNITY)
Admission: RE | Admit: 2011-07-16 | Discharge: 2011-07-16 | Disposition: A | Discharge status: Home / Self Care | Payer: Medicare Other | Source: Ambulatory Visit | Attending: Orthopedic Surgery | Admitting: Orthopedic Surgery

## 2011-07-16 ENCOUNTER — Ambulatory Visit (HOSPITAL_COMMUNITY)
Admission: RE | Admit: 2011-07-16 | Discharge: 2011-07-16 | Disposition: A | Discharge status: Home / Self Care | Payer: Medicare Other | Source: Ambulatory Visit | Attending: Orthopedic Surgery | Admitting: Orthopedic Surgery

## 2011-07-16 ENCOUNTER — Other Ambulatory Visit (HOSPITAL_COMMUNITY): Payer: Self-pay | Admitting: Orthopedic Surgery

## 2011-07-16 DIAGNOSIS — I10 Essential (primary) hypertension: Secondary | ICD-10-CM | POA: Insufficient documentation

## 2011-07-16 DIAGNOSIS — Z01818 Encounter for other preprocedural examination: Secondary | ICD-10-CM | POA: Insufficient documentation

## 2011-07-16 DIAGNOSIS — J4489 Other specified chronic obstructive pulmonary disease: Secondary | ICD-10-CM | POA: Insufficient documentation

## 2011-07-16 DIAGNOSIS — Z01810 Encounter for preprocedural cardiovascular examination: Secondary | ICD-10-CM | POA: Insufficient documentation

## 2011-07-16 DIAGNOSIS — M25569 Pain in unspecified knee: Secondary | ICD-10-CM

## 2011-07-16 DIAGNOSIS — J449 Chronic obstructive pulmonary disease, unspecified: Secondary | ICD-10-CM | POA: Insufficient documentation

## 2011-07-16 DIAGNOSIS — Z01812 Encounter for preprocedural laboratory examination: Secondary | ICD-10-CM | POA: Insufficient documentation

## 2011-07-16 LAB — PROTIME-INR
INR: 1.02 (ref 0.00–1.49)
Prothrombin Time: 13.6 seconds (ref 11.6–15.2)

## 2011-07-16 LAB — CBC
HCT: 37 % (ref 36.0–46.0)
Hemoglobin: 12.4 g/dL (ref 12.0–15.0)
MCHC: 33.5 g/dL (ref 30.0–36.0)
MCV: 92.7 fL (ref 78.0–100.0)

## 2011-07-16 LAB — BASIC METABOLIC PANEL
Chloride: 100 mEq/L (ref 96–112)
GFR calc Af Amer: >60 mL/min (ref >60)
GFR calc non Af Amer: 59 mL/min — ABNORMAL LOW (ref >60)
Glucose, Bld: 118 mg/dL — ABNORMAL HIGH (ref 70–99)
Potassium: 3.3 mEq/L — ABNORMAL LOW (ref 3.5–5.1)
Sodium: 142 mEq/L (ref 135–145)

## 2011-07-16 LAB — DIFFERENTIAL
Basophils Absolute: 0.1 10*3/uL (ref 0.0–0.1)
Lymphocytes Relative: 28 % (ref 12–46)
Lymphs Abs: 2.3 10*3/uL (ref 0.7–4.0)
Monocytes Absolute: 0.5 10*3/uL (ref 0.1–1.0)
Monocytes Relative: 6 % (ref 3–12)
Neutro Abs: 5.3 10*3/uL (ref 1.7–7.7)

## 2011-07-16 LAB — URINALYSIS, ROUTINE W REFLEX MICROSCOPIC
Bilirubin Urine: NEGATIVE
Glucose, UA: NEGATIVE mg/dL
Hgb urine dipstick: NEGATIVE
Protein, ur: NEGATIVE mg/dL

## 2011-07-16 LAB — SURGICAL PCR SCREEN: Staphylococcus aureus: NEGATIVE

## 2011-07-16 LAB — TYPE AND SCREEN: ABO/RH(D): O POS

## 2011-07-21 ENCOUNTER — Inpatient Hospital Stay (HOSPITAL_COMMUNITY)
Admission: RE | Admit: 2011-07-21 | Discharge: 2011-07-24 | DRG: 468 | Disposition: A | Discharge status: Skilled Nursing Facility | Payer: Medicare Other | Source: Ambulatory Visit | Attending: Orthopedic Surgery | Admitting: Orthopedic Surgery

## 2011-07-21 DIAGNOSIS — Z01812 Encounter for preprocedural laboratory examination: Secondary | ICD-10-CM

## 2011-07-21 DIAGNOSIS — Y92009 Unspecified place in unspecified non-institutional (private) residence as the place of occurrence of the external cause: Secondary | ICD-10-CM

## 2011-07-21 DIAGNOSIS — Z01810 Encounter for preprocedural cardiovascular examination: Secondary | ICD-10-CM

## 2011-07-21 DIAGNOSIS — K219 Gastro-esophageal reflux disease without esophagitis: Secondary | ICD-10-CM | POA: Diagnosis present

## 2011-07-21 DIAGNOSIS — Z7901 Long term (current) use of anticoagulants: Secondary | ICD-10-CM

## 2011-07-21 DIAGNOSIS — T8489XA Other specified complication of internal orthopedic prosthetic devices, implants and grafts, initial encounter: Principal | ICD-10-CM | POA: Diagnosis present

## 2011-07-21 DIAGNOSIS — Y831 Surgical operation with implant of artificial internal device as the cause of abnormal reaction of the patient, or of later complication, without mention of misadventure at the time of the procedure: Secondary | ICD-10-CM | POA: Diagnosis present

## 2011-07-21 DIAGNOSIS — E119 Type 2 diabetes mellitus without complications: Secondary | ICD-10-CM | POA: Diagnosis present

## 2011-07-21 DIAGNOSIS — F411 Generalized anxiety disorder: Secondary | ICD-10-CM | POA: Diagnosis present

## 2011-07-21 DIAGNOSIS — J42 Unspecified chronic bronchitis: Secondary | ICD-10-CM | POA: Diagnosis present

## 2011-07-21 DIAGNOSIS — I10 Essential (primary) hypertension: Secondary | ICD-10-CM | POA: Diagnosis present

## 2011-07-21 LAB — GLUCOSE, CAPILLARY
Glucose-Capillary: 245 mg/dL — ABNORMAL HIGH (ref 70–99)
Glucose-Capillary: 294 mg/dL — ABNORMAL HIGH (ref 70–99)

## 2011-07-21 NOTE — Op Note (Signed)
NAMEISAAC, [name] NO.:  [id number]  MEDICAL RECORD NO.:  [id number]  LOCATION:  2899                         FACILITY:  [facility]  PHYSICIAN:  [name], [name]   DATE OF BIRTH:  July 26, 1933  DATE OF PROCEDURE:  07/21/2011 DATE OF DISCHARGE:                              OPERATIVE REPORT   PREOPERATIVE DIAGNOSIS:  Status post unicondylar total knee arthroplasty done at another facility with varus deformity and pain.  POSTOPERATIVE DIAGNOSIS:  Status post unicondylar total knee arthroplasty done at another facility with varus deformity and pain.  PROCEDURE:  Revision of left total knee arthroplasty with removal of the unicondylar knee arthroplasty and revision to the DePuy Sigma RP knee with a 3 femur, 3 tibia, 10-mm Sigma RP bearing, 32-mm patellar button. All components were cemented, double batch of DePuy HV cement with 1500 mg of Zinacef.  SURGEON:  Melanie Gottron. Turner Daniels, MD  FIRST ASSISTANT:  Melanie Harris PA-C  SECOND ASSISTANT:  Melanie Jupiter, RN  ESTIMATED BLOOD LOSS:  Minimal.  FLUID REPLACEMENT:  1500 mL of crystalloid.  DRAINS PLACED:  Two medium Hemovacs and a Foley catheter.  URINE OUTPUT:  300 mL.  TOURNIQUET TIME:  1 hour and 35 minutes.  INDICATIONS FOR PROCEDURE:  A 77 year old woman who underwent a unicondylar right total knee arthroplasty at another facility about 2 years ago, has a 5 degrees varus deformity, no obvious loosening of the components, but she walks with an antalgic gait, has significant medial joint line pain and desires revision to a total knee arthroplasty.  She has a revision of total knee on the right side, this doing quite well. Risks and benefits of surgery have been discussed, questions were answered preoperatively.  She had no fevers, no chills, no wound problems, and there is no evidence of infection.  She has failed conservative measures with anti-inflammatory medicines, cortisone injection, activity  modification, but still has severe unremitting pain and again the obvious varus deformity of the left knee.  DESCRIPTION OF PROCEDURE:  The patient was identified by armband and received preoperative IV antibiotics in the holding area at [facility], and also she received a left femoral nerve block.  She was taken to operating room #5, appropriate anesthetic monitors were attached and endotracheal anesthesia was induced.  The patient in supine position.  Lateral post and foot positioner were applied to the table. Tourniquet was applied high to the left thigh and a Foley catheter was inserted.  The left lower extremity was prepped and draped in usual sterile fashion from the ankle to the tourniquet.  Time-out procedure was performed.  Limb wrapped with an Esmarch bandage, tourniquet inflated at 350 mmHg with the knee bent and we began the operation by recreating the old medial parapatellar incision and extending it proximally allowing [place] to expose knee evert the patella, which was also down to bare bone arthritis.  Small bleeders were identified and cauterized.  Medial parapatellar arthrotomy accomplished.  We took care to elevate the superficial medial collateral ligament off the proximal tibia over an extended 8 cm region to give some lengthening of the medial structures because of the varus deformity.  The  patella was everted, prepatellar fat pad was resected.  The knee was then hyperflexed exposing the unicondylar knee arthroplasty as well as the rest of the knee.  The patellofemoral joint did have some bare bone arthritis.  At this point, we set about removing the metal femoral component.  This was accomplished with small and straight-curve 0.25- inch osteotomes.  Small amount of the posterior condyle was removed, but there was still plenty of bone allowing for cementing of a primary total knee femoral implant.  With the knee hyperflexed and the patella everted, we then used a  step drill down to the proximal tibia followed by the IM rod and a 2-degree posterior slope cutting guide.  Because the tibial component did not appear to be loose and was cemented polyethylene, we just performed a standard cut off the proximal tibia that actually removed all of the tibial component except for the pegs, which were actually well-fixed.  Posterior structures were protected with the posteromedial Z retractor, McCulloch retractor through the notch and the lateral Hohmann retractor.  I satisfied with a proximal tibial resection.  We then directed our attention to the distal femur and entered the femur 2 mm anterior to the PCL origin with a standard intramedullary rod.  A 5 degrees left distal femoral cutting guide set at 11 mm was pinned along the epicondylar axis and the distal femoral cut accomplished, which did actually remove some distal femoral bone giving [place] a fairly standard distal cut.  Using the posterior referencing guide, we then sized for a #3 femoral component and confirmed the size with trial implants to make sure the width and height were appropriate. The cutting guide was then pinned in a 0 degrees of rotation and the anterior, posterior and chamfer cuts were accomplished.  The chamfer cut and the posterior cut on the medial side removed very little of any bone.  The notch cut was then performed with the notch cutting guide. The knee was brought to full extension.  We checked our extension gap and it was excellent and then flexion the gap between the lateral condyle and the tibia was also excellent.  The medial condyle did have a deficiency of a couple of millimeters, but this was felt to be cementable.  At this point, we measured the patella at 19 mm, set the cutting guide at 14, and removed the posterior 6 mm of the patella, sized for a 32 button and drilled the patella.  At this point, the knee was then hyperflexed.  We sized for a #3 tibial baseplate, pinned  in place and had used a pin on a drilled medially because of the cement that was still interlocked into the bone.  We then applied the smokestack in the conical reamer.  The fins were actually carved out with a small saw and then the Delta fin keel punch was applied.  We then hammered into place a three 11 trial femurs, drilled the lugs, inserted a 10-mm bearing and checked our ligamentous stability in extension and flexion.  It was excellent and in flexion.  There was no instability of the medial side because of the capture from the chamfer cuts on the lateral side.  The defect was definitely cementable at this point.  At this point, all trial components were removed, bony surfaces water picked, cleaned and dried with suction and sponges.  Any bits of fibrous tissue, they were in the bone from the previous unicondylar knee were removed with curettes and rongeurs, and  a double batch of DePuy HV cement with 1500 mg of Zinacef was mixed at the back table and applied to all bony and metallic mating surfaces.  In order, we hammered into place a three tibial component and removed the excess cement, a three left femoral component and removed the excess cement and we were careful to make sure that none of the cement on the posteromedial condyle had escaped capture.  We then inserted the real 10-mm Sigma RP bearing held the knee in full extension as the cement cured.  The patellar button was clamped into place and excess cement removed from that as well.  Drains were placed from the anterolateral approach and the wound irrigated out with a pulse lavage one more time.  After the cement had hardened, we then clamped the patella, checked our tracking one more time and found it to be excellent.  The parapatellar arthrotomy was then closed with running #1 Vicryl suture, the subcutaneous tissue with 0 and 2-0 undyed Vicryl suture and the skin with skin staples.  Dressing of Xeroform, 4x4 dressing,  sponges, Webril and an Ace wrap applied.  The patient was awakened, extubated, and taken to the recovery room without difficulty.     [name], [name]     Ovid Curd  D:  07/21/2011  T:  07/21/2011  Job:  [id number]  Electronically Signed by Gean Birchwood [name] on 07/21/2011 07:33:11 PM

## 2011-07-22 LAB — CBC
Hemoglobin: 9.2 g/dL — ABNORMAL LOW (ref 12.0–15.0)
MCHC: 33.1 g/dL (ref 30.0–36.0)
RBC: 2.96 MIL/uL — ABNORMAL LOW (ref 3.87–5.11)
WBC: 10.1 10*3/uL (ref 4.0–10.5)

## 2011-07-22 LAB — PROTIME-INR
INR: 1.17 (ref 0.00–1.49)
Prothrombin Time: 15.1 seconds (ref 11.6–15.2)

## 2011-07-22 LAB — GLUCOSE, CAPILLARY: Glucose-Capillary: 220 mg/dL — ABNORMAL HIGH (ref 70–99)

## 2011-07-22 LAB — BASIC METABOLIC PANEL
Chloride: 104 mEq/L (ref 96–112)
Creatinine, Ser: 0.94 mg/dL (ref 0.50–1.10)
GFR calc Af Amer: >60 mL/min (ref >60)
Potassium: 3.6 mEq/L (ref 3.5–5.1)

## 2011-07-23 LAB — CBC
HCT: 26.4 % — ABNORMAL LOW (ref 36.0–46.0)
MCH: 30.6 pg (ref 26.0–34.0)
MCV: 94 fL (ref 78.0–100.0)
RDW: 13.4 % (ref 11.5–15.5)
WBC: 8.8 10*3/uL (ref 4.0–10.5)

## 2011-07-23 LAB — GLUCOSE, CAPILLARY: Glucose-Capillary: 167 mg/dL — ABNORMAL HIGH (ref 70–99)

## 2011-07-24 LAB — CBC
HCT: 24.5 % — ABNORMAL LOW (ref 36.0–46.0)
Hemoglobin: 8.2 g/dL — ABNORMAL LOW (ref 12.0–15.0)
MCHC: 33.5 g/dL (ref 30.0–36.0)
MCV: 93.2 fL (ref 78.0–100.0)
WBC: 7.8 10*3/uL (ref 4.0–10.5)

## 2011-07-24 LAB — PROTIME-INR: INR: 1.77 — ABNORMAL HIGH (ref 0.00–1.49)

## 2011-07-26 LAB — ANAEROBIC CULTURE: Gram Stain: NONE SEEN

## 2011-07-29 NOTE — Discharge Summary (Signed)
NAMEJERE, [name] NO.:  [id number]  MEDICAL RECORD NO.:  [id number]  LOCATION:  5030                         FACILITY:  [facility]  PHYSICIAN:  Feliberto Gottron. Turner Daniels, [name]   DATE OF BIRTH:  1932-12-16  DATE OF ADMISSION:  07/21/2011 DATE OF DISCHARGE:  07/24/2011                              DISCHARGE SUMMARY   CHIEF COMPLAINT:  Left knee pain.  HISTORY OF PRESENT ILLNESS:  This is a 77 year old lady who underwent a left unicompartmental knee replacement several years ago, she gone on to develop significant pain in her knee and now desires elective revision to a left total knee arthroplasty.  All risks and benefits of surgery were discussed with the patient.  PAST MEDICAL HISTORY:  Significant for hypertension, diet-controlled diabetes, and anxiety.  She has a chronic bronchitis.  PAST SURGICAL HISTORY:  Significant for a left unicompartmental knee replacement.  ALLERGIES:  She has an allergy to CODEINE. FAMILY HISTORY:  Noncontributory.  SOCIAL HISTORY:  She denies use of alcohol or tobacco.  PHYSICAL EXAM:  Gross examination of left knee demonstrates a well- healed surgical incision.  She has a varus deformity.  Range of motion is 5-120 degrees.  She is tender to palpation along the medial joint line.  Neurovascular exam is within normal limits.  X-RAYS:  4-views of the left knee demonstrate a well-fixed unicompartmental knee on the medial side.  She has a varus deformity.  PREOP LABS:  White blood cells 8.4, red blood cells 3.99, hemoglobin 12.4, hematocrit 37, platelets 297.  PT 13.6, INR 1.02, PTT 27.  Sodium 142, potassium 3.3, chloride 100, glucose 118, BUN 16, creatinine 0.92. Urinalysis was within normal limits.  HOSPITAL COURSE:  Ms. Samarin was admitted to [facility] on July 21, 2011 when she underwent conversion of her unicondylar total knee to a left total knee arthroplasty.  The procedure was performed by Dr. Gean Birchwood and the patient  tolerated it well.  Two Hemovac drains were placed into the left knee and perioperative Foley catheter was also placed. She was transferred to the floor on Lovenox and Coumadin for DVT prophylaxis.  On the first postoperative day, she was awake and alert and reporting good pain control.  Her surgical dressing was dry.  Foley catheter was removed after physical therapy.  Drain from the knee was removed by Dr. Turner Daniels.  On postoperative day #2, she was progressing slowly with physical therapy and she denied any dizziness or shortness of breath.  By postoperative day #3, she continued to make progress with physical therapy.  Her hemoglobin was 8.2 but she continued to deny any symptoms of anemia.  Surgical dressing remained clean after it was changed on postop day #2.  DISPOSITION:  The patient was discharged to a skilled nursing facility for short stay for rehab on July 24, 2011.  She was weightbearing as tolerated.  She will remain on Coumadin for a total of 2 weeks with a target INR of 1.52-0.0.  Skilled nursing would manage her wound, Coumadin and physical therapy.  She would return to the office to see Dr. Turner Daniels on postoperative day 14 for x-rays and staple removal.  FINAL  DIAGNOSIS:  Painful left unicompartmental knee.     Shirl Harris, PA   ______________________________ Feliberto Gottron. Turner Daniels, [name]    JW/MEDQ  D:  07/24/2011  T:  07/24/2011  Job:  [id number]  Electronically Signed by Shirl Harris PA on 07/29/2011 01:04:33 PM Electronically Signed by Gean Birchwood [name] on 07/29/2011 11:12:43 PM

## 2011-08-05 LAB — CBC
HCT: 35.2 — ABNORMAL LOW
MCV: 89.7
RBC: 3.92
WBC: 8.6

## 2011-08-05 LAB — BASIC METABOLIC PANEL
Chloride: 98
GFR calc Af Amer: >60
Potassium: 3.4 — ABNORMAL LOW

## 2011-08-13 LAB — POCT CARDIAC MARKERS: Myoglobin, poc: 55.1

## 2011-08-13 LAB — COMPREHENSIVE METABOLIC PANEL
AST: 31
BUN: 11
CO2: 30
Chloride: 101
Creatinine, Ser: 0.83
GFR calc non Af Amer: >60
Glucose, Bld: 108 — ABNORMAL HIGH
Total Bilirubin: 0.8

## 2011-08-13 LAB — CBC
HCT: 37.3
Hemoglobin: 12.4
MCV: 94.4
RBC: 3.96
WBC: 7.3

## 2011-08-13 LAB — URINALYSIS, ROUTINE W REFLEX MICROSCOPIC
Glucose, UA: NEGATIVE
Hgb urine dipstick: NEGATIVE
Ketones, ur: NEGATIVE
Protein, ur: NEGATIVE

## 2011-08-13 LAB — DIFFERENTIAL
Basophils Absolute: 0.1
Eosinophils Relative: 3
Lymphocytes Relative: 36

## 2011-08-15 LAB — BASIC METABOLIC PANEL
CO2: 32 mEq/L (ref 19–32)
Chloride: 98 mEq/L (ref 96–112)
GFR calc Af Amer: >60 mL/min (ref >60)
Potassium: 3.5 mEq/L (ref 3.5–5.1)
Sodium: 140 mEq/L (ref 135–145)

## 2011-08-15 LAB — CBC
HCT: 38.7 % (ref 36.0–46.0)
Hemoglobin: 12.7 g/dL (ref 12.0–15.0)
MCHC: 32.8 g/dL (ref 30.0–36.0)
MCV: 94.9 fL (ref 78.0–100.0)
RBC: 4.07 MIL/uL (ref 3.87–5.11)

## 2011-08-15 LAB — GLUCOSE, CAPILLARY

## 2011-08-19 LAB — BASIC METABOLIC PANEL
Calcium: 9.2
GFR calc non Af Amer: 55 — ABNORMAL LOW
Potassium: 3.8
Sodium: 138

## 2011-08-19 LAB — PROTIME-INR: Prothrombin Time: 13

## 2011-08-19 LAB — APTT: aPTT: 32

## 2011-08-19 LAB — CBC
HCT: 36.2
Hemoglobin: 12.1
Platelets: 344
WBC: 7.4

## 2011-08-22 LAB — DIFFERENTIAL
Basophils Relative: 1
Eosinophils Absolute: 0.2
Lymphs Abs: 2.4
Neutrophils Relative %: 60

## 2011-08-22 LAB — CBC
HCT: 27.6 — ABNORMAL LOW
MCHC: 34
MCV: 92.8
MCV: 92.9
Platelets: 296
Platelets: 299
RDW: 13.6
WBC: 7.9

## 2011-08-22 LAB — PROTIME-INR
INR: 0.9
Prothrombin Time: 12.3

## 2011-08-22 LAB — BASIC METABOLIC PANEL
BUN: 12
BUN: 20
CO2: 30
Chloride: 101
Chloride: 106
Creatinine, Ser: 0.85
Glucose, Bld: 145 — ABNORMAL HIGH
Potassium: 3.1 — ABNORMAL LOW

## 2011-08-25 LAB — URINALYSIS, ROUTINE W REFLEX MICROSCOPIC
Bilirubin Urine: NEGATIVE
Ketones, ur: NEGATIVE
Nitrite: NEGATIVE
Protein, ur: NEGATIVE
Urobilinogen, UA: 0.2
pH: 5.5

## 2011-08-25 LAB — LIPID PANEL
Cholesterol: 232 — ABNORMAL HIGH
LDL Cholesterol: UNDETERMINED
VLDL: UNDETERMINED

## 2011-08-25 LAB — I-STAT 8, (EC8 V) (CONVERTED LAB)
Acid-Base Excess: 8 — ABNORMAL HIGH
BUN: 20
Bicarbonate: 33.7 — ABNORMAL HIGH
Chloride: 100
Glucose, Bld: 156 — ABNORMAL HIGH
HCT: 37
Hemoglobin: 12.6
Operator id: 291361
Potassium: 3.1 — ABNORMAL LOW
Sodium: 137
TCO2: 35
pCO2, Ven: 52 — ABNORMAL HIGH
pH, Ven: 7.421 — ABNORMAL HIGH

## 2011-08-25 LAB — DIFFERENTIAL
Basophils Absolute: 0.1
Basophils Absolute: 0.1
Eosinophils Absolute: 0.2
Eosinophils Absolute: 0.2
Eosinophils Relative: 2
Eosinophils Relative: 2
Lymphocytes Relative: 23
Lymphocytes Relative: 35
Lymphs Abs: 2.7
Lymphs Abs: 2.8
Monocytes Absolute: 0.5
Monocytes Absolute: 0.6
Monocytes Absolute: 0.7
Monocytes Relative: 6
Monocytes Relative: 7
Neutro Abs: 6.9
Neutrophils Relative %: 68

## 2011-08-25 LAB — MAGNESIUM: Magnesium: 2.2

## 2011-08-25 LAB — PROTIME-INR
INR: 0.9
Prothrombin Time: 12.2
Prothrombin Time: 12.3

## 2011-08-25 LAB — PHOSPHORUS: Phosphorus: 2.9

## 2011-08-25 LAB — BASIC METABOLIC PANEL
BUN: 16
CO2: 34 — ABNORMAL HIGH
Chloride: 101
Chloride: 105
GFR calc Af Amer: >60
GFR calc non Af Amer: >60
Potassium: 3.9
Potassium: 3.9
Sodium: 142
Sodium: 144

## 2011-08-25 LAB — CBC
HCT: 34.8 — ABNORMAL LOW
HCT: 35.8 — ABNORMAL LOW
HCT: 37
Hemoglobin: 11.8 — ABNORMAL LOW
Hemoglobin: 12.6
MCHC: 34
MCV: 92.5
MCV: 93
MCV: 93.6
Platelets: 299
Platelets: 306
RBC: 3.72 — ABNORMAL LOW
RDW: 13.8
WBC: 8.1

## 2011-08-25 LAB — CK TOTAL AND CKMB (NOT AT ARMC): Relative Index: INVALID

## 2011-08-25 LAB — POCT I-STAT CREATININE
Creatinine, Ser: 1
Operator id: 291361

## 2011-08-25 LAB — HEMOGLOBIN A1C: Mean Plasma Glucose: 190

## 2011-08-25 LAB — APTT: aPTT: 30

## 2011-09-05 ENCOUNTER — Other Ambulatory Visit: Payer: Self-pay | Admitting: Internal Medicine

## 2011-09-05 DIAGNOSIS — Z1231 Encounter for screening mammogram for malignant neoplasm of breast: Secondary | ICD-10-CM

## 2011-09-11 ENCOUNTER — Ambulatory Visit
Admission: RE | Admit: 2011-09-11 | Discharge: 2011-09-11 | Disposition: A | Discharge status: Home / Self Care | Payer: Medicare Other | Source: Ambulatory Visit | Attending: Internal Medicine | Admitting: Internal Medicine

## 2011-09-11 DIAGNOSIS — Z1231 Encounter for screening mammogram for malignant neoplasm of breast: Secondary | ICD-10-CM

## 2011-11-19 DIAGNOSIS — Z111 Encounter for screening for respiratory tuberculosis: Secondary | ICD-10-CM | POA: Diagnosis not present

## 2011-11-19 DIAGNOSIS — E119 Type 2 diabetes mellitus without complications: Secondary | ICD-10-CM | POA: Diagnosis not present

## 2011-11-19 DIAGNOSIS — Z79899 Other long term (current) drug therapy: Secondary | ICD-10-CM | POA: Diagnosis not present

## 2011-11-19 DIAGNOSIS — Z1212 Encounter for screening for malignant neoplasm of rectum: Secondary | ICD-10-CM | POA: Diagnosis not present

## 2011-11-19 DIAGNOSIS — E782 Mixed hyperlipidemia: Secondary | ICD-10-CM | POA: Diagnosis not present

## 2011-11-19 DIAGNOSIS — N3000 Acute cystitis without hematuria: Secondary | ICD-10-CM | POA: Diagnosis not present

## 2011-11-19 DIAGNOSIS — E559 Vitamin D deficiency, unspecified: Secondary | ICD-10-CM | POA: Diagnosis not present

## 2011-11-19 DIAGNOSIS — I10 Essential (primary) hypertension: Secondary | ICD-10-CM | POA: Diagnosis not present

## 2012-01-27 DIAGNOSIS — H0019 Chalazion unspecified eye, unspecified eyelid: Secondary | ICD-10-CM | POA: Diagnosis not present

## 2012-02-09 DIAGNOSIS — H0019 Chalazion unspecified eye, unspecified eyelid: Secondary | ICD-10-CM | POA: Diagnosis not present

## 2012-02-19 DIAGNOSIS — E782 Mixed hyperlipidemia: Secondary | ICD-10-CM | POA: Diagnosis not present

## 2012-02-19 DIAGNOSIS — Z79899 Other long term (current) drug therapy: Secondary | ICD-10-CM | POA: Diagnosis not present

## 2012-02-19 DIAGNOSIS — E119 Type 2 diabetes mellitus without complications: Secondary | ICD-10-CM | POA: Diagnosis not present

## 2012-02-19 DIAGNOSIS — I10 Essential (primary) hypertension: Secondary | ICD-10-CM | POA: Diagnosis not present

## 2012-02-19 DIAGNOSIS — N3000 Acute cystitis without hematuria: Secondary | ICD-10-CM | POA: Diagnosis not present

## 2012-02-19 DIAGNOSIS — E559 Vitamin D deficiency, unspecified: Secondary | ICD-10-CM | POA: Diagnosis not present

## 2012-03-24 DIAGNOSIS — M19049 Primary osteoarthritis, unspecified hand: Secondary | ICD-10-CM | POA: Diagnosis not present

## 2012-03-24 DIAGNOSIS — M25549 Pain in joints of unspecified hand: Secondary | ICD-10-CM | POA: Diagnosis not present

## 2012-05-31 DIAGNOSIS — I10 Essential (primary) hypertension: Secondary | ICD-10-CM | POA: Diagnosis not present

## 2012-05-31 DIAGNOSIS — R7309 Other abnormal glucose: Secondary | ICD-10-CM | POA: Diagnosis not present

## 2012-05-31 DIAGNOSIS — E782 Mixed hyperlipidemia: Secondary | ICD-10-CM | POA: Diagnosis not present

## 2012-05-31 DIAGNOSIS — Z79899 Other long term (current) drug therapy: Secondary | ICD-10-CM | POA: Diagnosis not present

## 2012-05-31 DIAGNOSIS — N3000 Acute cystitis without hematuria: Secondary | ICD-10-CM | POA: Diagnosis not present

## 2012-05-31 DIAGNOSIS — E559 Vitamin D deficiency, unspecified: Secondary | ICD-10-CM | POA: Diagnosis not present

## 2012-07-08 DIAGNOSIS — M25569 Pain in unspecified knee: Secondary | ICD-10-CM | POA: Diagnosis not present

## 2012-08-02 ENCOUNTER — Other Ambulatory Visit: Payer: Self-pay | Admitting: Internal Medicine

## 2012-08-02 DIAGNOSIS — Z1231 Encounter for screening mammogram for malignant neoplasm of breast: Secondary | ICD-10-CM

## 2012-08-31 DIAGNOSIS — E559 Vitamin D deficiency, unspecified: Secondary | ICD-10-CM | POA: Diagnosis not present

## 2012-08-31 DIAGNOSIS — E538 Deficiency of other specified B group vitamins: Secondary | ICD-10-CM | POA: Diagnosis not present

## 2012-08-31 DIAGNOSIS — Z23 Encounter for immunization: Secondary | ICD-10-CM | POA: Diagnosis not present

## 2012-08-31 DIAGNOSIS — E782 Mixed hyperlipidemia: Secondary | ICD-10-CM | POA: Diagnosis not present

## 2012-08-31 DIAGNOSIS — R002 Palpitations: Secondary | ICD-10-CM | POA: Diagnosis not present

## 2012-08-31 DIAGNOSIS — I10 Essential (primary) hypertension: Secondary | ICD-10-CM | POA: Diagnosis not present

## 2012-08-31 DIAGNOSIS — D649 Anemia, unspecified: Secondary | ICD-10-CM | POA: Diagnosis not present

## 2012-08-31 DIAGNOSIS — E119 Type 2 diabetes mellitus without complications: Secondary | ICD-10-CM | POA: Diagnosis not present

## 2012-09-03 ENCOUNTER — Ambulatory Visit (HOSPITAL_COMMUNITY)
Admission: RE | Admit: 2012-09-03 | Discharge: 2012-09-03 | Disposition: A | Discharge status: Home / Self Care | Payer: Medicare Other | Source: Ambulatory Visit | Attending: Internal Medicine | Admitting: Internal Medicine

## 2012-09-03 ENCOUNTER — Other Ambulatory Visit (HOSPITAL_COMMUNITY): Payer: Self-pay | Admitting: Internal Medicine

## 2012-09-03 DIAGNOSIS — I700 Atherosclerosis of aorta: Secondary | ICD-10-CM | POA: Diagnosis not present

## 2012-09-03 DIAGNOSIS — R002 Palpitations: Secondary | ICD-10-CM | POA: Insufficient documentation

## 2012-09-03 DIAGNOSIS — I10 Essential (primary) hypertension: Secondary | ICD-10-CM | POA: Insufficient documentation

## 2012-09-03 DIAGNOSIS — R059 Cough, unspecified: Secondary | ICD-10-CM | POA: Diagnosis not present

## 2012-09-03 DIAGNOSIS — R05 Cough: Secondary | ICD-10-CM | POA: Insufficient documentation

## 2012-09-13 ENCOUNTER — Ambulatory Visit: Payer: Medicare Other

## 2012-10-04 DIAGNOSIS — E559 Vitamin D deficiency, unspecified: Secondary | ICD-10-CM | POA: Diagnosis not present

## 2012-10-04 DIAGNOSIS — D649 Anemia, unspecified: Secondary | ICD-10-CM | POA: Diagnosis not present

## 2012-10-08 ENCOUNTER — Ambulatory Visit
Admission: RE | Admit: 2012-10-08 | Discharge: 2012-10-08 | Disposition: A | Discharge status: Home / Self Care | Payer: Medicare Other | Source: Ambulatory Visit | Attending: Internal Medicine | Admitting: Internal Medicine

## 2012-10-08 DIAGNOSIS — Z1231 Encounter for screening mammogram for malignant neoplasm of breast: Secondary | ICD-10-CM | POA: Diagnosis not present

## 2012-10-12 ENCOUNTER — Other Ambulatory Visit: Payer: Self-pay | Admitting: Internal Medicine

## 2012-10-12 DIAGNOSIS — R928 Other abnormal and inconclusive findings on diagnostic imaging of breast: Secondary | ICD-10-CM

## 2012-10-20 ENCOUNTER — Ambulatory Visit
Admission: RE | Admit: 2012-10-20 | Discharge: 2012-10-20 | Disposition: A | Discharge status: Home / Self Care | Payer: Medicare Other | Source: Ambulatory Visit | Attending: Internal Medicine | Admitting: Internal Medicine

## 2012-10-20 DIAGNOSIS — R928 Other abnormal and inconclusive findings on diagnostic imaging of breast: Secondary | ICD-10-CM

## 2012-11-26 DIAGNOSIS — E559 Vitamin D deficiency, unspecified: Secondary | ICD-10-CM | POA: Diagnosis not present

## 2012-11-26 DIAGNOSIS — Z23 Encounter for immunization: Secondary | ICD-10-CM | POA: Diagnosis not present

## 2012-11-26 DIAGNOSIS — Z79899 Other long term (current) drug therapy: Secondary | ICD-10-CM | POA: Diagnosis not present

## 2012-11-26 DIAGNOSIS — N3000 Acute cystitis without hematuria: Secondary | ICD-10-CM | POA: Diagnosis not present

## 2012-11-26 DIAGNOSIS — Z1212 Encounter for screening for malignant neoplasm of rectum: Secondary | ICD-10-CM | POA: Diagnosis not present

## 2012-11-26 DIAGNOSIS — E782 Mixed hyperlipidemia: Secondary | ICD-10-CM | POA: Diagnosis not present

## 2012-11-26 DIAGNOSIS — R7309 Other abnormal glucose: Secondary | ICD-10-CM | POA: Diagnosis not present

## 2012-11-26 DIAGNOSIS — I10 Essential (primary) hypertension: Secondary | ICD-10-CM | POA: Diagnosis not present

## 2013-01-19 ENCOUNTER — Encounter (HOSPITAL_BASED_OUTPATIENT_CLINIC_OR_DEPARTMENT_OTHER): Payer: Self-pay | Admitting: *Deleted

## 2013-01-19 ENCOUNTER — Emergency Department (HOSPITAL_BASED_OUTPATIENT_CLINIC_OR_DEPARTMENT_OTHER): Payer: Medicare Other

## 2013-01-19 ENCOUNTER — Emergency Department (HOSPITAL_BASED_OUTPATIENT_CLINIC_OR_DEPARTMENT_OTHER)
Admission: EM | Admit: 2013-01-19 | Discharge: 2013-01-19 | Disposition: A | Discharge status: Home / Self Care | Payer: Medicare Other | Attending: Emergency Medicine | Admitting: Emergency Medicine

## 2013-01-19 DIAGNOSIS — M25529 Pain in unspecified elbow: Secondary | ICD-10-CM | POA: Diagnosis not present

## 2013-01-19 DIAGNOSIS — E119 Type 2 diabetes mellitus without complications: Secondary | ICD-10-CM | POA: Insufficient documentation

## 2013-01-19 DIAGNOSIS — R269 Unspecified abnormalities of gait and mobility: Secondary | ICD-10-CM | POA: Diagnosis not present

## 2013-01-19 DIAGNOSIS — S50311A Abrasion of right elbow, initial encounter: Secondary | ICD-10-CM

## 2013-01-19 DIAGNOSIS — S59909A Unspecified injury of unspecified elbow, initial encounter: Secondary | ICD-10-CM | POA: Diagnosis not present

## 2013-01-19 DIAGNOSIS — I10 Essential (primary) hypertension: Secondary | ICD-10-CM | POA: Insufficient documentation

## 2013-01-19 DIAGNOSIS — Z4789 Encounter for other orthopedic aftercare: Secondary | ICD-10-CM | POA: Diagnosis not present

## 2013-01-19 DIAGNOSIS — Y939 Activity, unspecified: Secondary | ICD-10-CM | POA: Insufficient documentation

## 2013-01-19 DIAGNOSIS — S63259A Unspecified dislocation of unspecified finger, initial encounter: Secondary | ICD-10-CM | POA: Diagnosis not present

## 2013-01-19 DIAGNOSIS — W010XXA Fall on same level from slipping, tripping and stumbling without subsequent striking against object, initial encounter: Secondary | ICD-10-CM | POA: Insufficient documentation

## 2013-01-19 DIAGNOSIS — IMO0002 Reserved for concepts with insufficient information to code with codable children: Secondary | ICD-10-CM | POA: Insufficient documentation

## 2013-01-19 DIAGNOSIS — S62609A Fracture of unspecified phalanx of unspecified finger, initial encounter for closed fracture: Secondary | ICD-10-CM | POA: Diagnosis not present

## 2013-01-19 DIAGNOSIS — S8000XA Contusion of unspecified knee, initial encounter: Secondary | ICD-10-CM | POA: Diagnosis not present

## 2013-01-19 DIAGNOSIS — S8990XA Unspecified injury of unspecified lower leg, initial encounter: Secondary | ICD-10-CM | POA: Diagnosis not present

## 2013-01-19 DIAGNOSIS — S62502A Fracture of unspecified phalanx of left thumb, initial encounter for closed fracture: Secondary | ICD-10-CM

## 2013-01-19 DIAGNOSIS — S99919A Unspecified injury of unspecified ankle, initial encounter: Secondary | ICD-10-CM | POA: Diagnosis not present

## 2013-01-19 DIAGNOSIS — S62639A Displaced fracture of distal phalanx of unspecified finger, initial encounter for closed fracture: Secondary | ICD-10-CM | POA: Diagnosis not present

## 2013-01-19 DIAGNOSIS — Y9289 Other specified places as the place of occurrence of the external cause: Secondary | ICD-10-CM | POA: Insufficient documentation

## 2013-01-19 HISTORY — DX: Essential (primary) hypertension: I10

## 2013-01-19 MED ORDER — BUPIVACAINE HCL (PF) 0.5 % IJ SOLN
INTRAMUSCULAR | Status: AC
  Administered 2013-01-19: 21:00:00
  Filled 2013-01-19: qty 10

## 2013-01-19 MED ORDER — HYDROCODONE-ACETAMINOPHEN 5-325 MG PO TABS: 2.0000 | ORAL_TABLET | ORAL | Status: DC | PRN

## 2013-01-19 MED ORDER — CEPHALEXIN 500 MG PO CAPS: 500.0000 mg | ORAL_CAPSULE | Freq: Four times a day (QID) | ORAL | Status: DC

## 2013-01-19 NOTE — ED Notes (Signed)
Tripped and fell at [facility]. Injured left thumb, right elbow and left knee. Denies hitting her head on anything.

## 2013-01-19 NOTE — ED Provider Notes (Signed)
History     CSN: [id number]  Arrival date & time 01/19/13  1749   First MD Initiated Contact with Patient 01/19/13 2004      Chief Complaint  Patient presents with  . Fall    (Consider location/radiation/quality/duration/timing/severity/associated sxs/prior treatment) HPI Comments: Patient had a mechanical fall at [facility] lobby injured her left thumb, right elbow, bilateral knees. Denies hitting her head or losing consciousness. Denies any dizziness or lightheadedness. She is not taking anticoagulants. She has a deformity to her left thumb at the IP joint. She has abrasions to her right elbow and bilateral knees. She denies any headache, neck pain, chest pain, back pain or abdominal pain.  The history is provided by the patient.    Past Medical History  Diagnosis Date  . Diabetes mellitus without complication   . Hypertension     Past Surgical History  Procedure Laterality Date  . Knee surgery    . Abdominal hysterectomy    . Tonsillectomy    . Appendectomy      No family history on file.  History  Substance Use Topics  . Smoking status: Never Smoker   . Smokeless tobacco: Not on file  . Alcohol Use: No    OB History   Grav Para Term Preterm Abortions TAB SAB Ect Mult Living                  Review of Systems  Constitutional: Negative for fever, activity change and appetite change.  HENT: Negative for congestion and rhinorrhea.   Respiratory: Negative for chest tightness.   Cardiovascular: Negative for chest pain.  Gastrointestinal: Negative for nausea, vomiting and abdominal pain.  Genitourinary: Negative for dysuria and hematuria.  Musculoskeletal: Positive for myalgias, arthralgias and gait problem.  Skin: Negative for rash.  Neurological: Negative for dizziness, weakness, numbness and headaches.  A complete 10 system review of systems was obtained and all systems are negative except as noted in the HPI and PMH.    Allergies  Review of patient's  allergies indicates no known allergies.  Home Medications   Current Outpatient Rx  Name  Route  Sig  Dispense  Refill  . cephALEXin (KEFLEX) 500 MG capsule   Oral   Take 1 capsule (500 mg total) by mouth 4 (four) times daily.   40 capsule   0   . HYDROcodone-acetaminophen (NORCO/VICODIN) 5-325 MG per tablet   Oral   Take 2 tablets by mouth every 4 (four) hours as needed for pain.   10 tablet   0     BP 145/76  Pulse 57  Temp(Src) 98.2 F (36.8 C) (Oral)  Resp 18  Ht 5\' 5"  (1.651 m)  Wt 156 lb (70.761 kg)  BMI 25.96 kg/m2  SpO2 95%  Physical Exam  Constitutional: She is oriented to person, place, and time. She appears well-developed and well-nourished. No distress.  HENT:  Head: Normocephalic and atraumatic.  Mouth/Throat: Oropharynx is clear and moist. No oropharyngeal exudate.  Eyes: Conjunctivae and EOM are normal. Pupils are equal, round, and reactive to light.  Neck: Normal range of motion. Neck supple.  Cardiovascular: Normal rate, regular rhythm and normal heart sounds.   No murmur heard. Pulmonary/Chest: Effort normal and breath sounds normal. No respiratory distress.  Abdominal: Soft. There is no tenderness. There is no rebound and no guarding.  Musculoskeletal: Normal range of motion. She exhibits tenderness. She exhibits no edema.  Abrasion right elbow without bony tenderness. Abrasions to bilateral knees. Flexion and extension  intact Compartment soft. +2 DP and PT pulses bilaterally  Deformity to left thumb IP joint with ecchymosis. Radial pulse intact.  Neurological: She is alert and oriented to person, place, and time. No cranial nerve deficit. She exhibits normal muscle tone. Coordination normal.  Skin: Skin is warm.    ED Course  Reduction of dislocation Date/Time: 01/19/2013 8:37 PM Performed by: Glynn Octave Authorized by: Glynn Octave Consent: Verbal consent obtained. Risks and benefits: risks, benefits and alternatives were  discussed Consent given by: patient Patient understanding: patient states understanding of the procedure being performed Patient identity confirmed: verbally with patient Time out: Immediately prior to procedure a "time out" was called to verify the correct patient, procedure, equipment, support staff and site/side marked as required. Preparation: Patient was prepped and draped in the usual sterile fashion. Local anesthesia used: yes Anesthesia: digital block Local anesthetic: lidocaine 1% without epinephrine Anesthetic total: 6 ml Patient sedated: no Patient tolerance: Patient tolerated the procedure well with no immediate complications.   (including critical care time)  Labs Reviewed - No data to display Dg Elbow Complete Right  01/19/2013  *RADIOLOGY REPORT*  Clinical Data: Fall, right elbow pain  RIGHT ELBOW - COMPLETE 3+ VIEW  Comparison: Concurrently obtained radiographs of the thumb and the  Findings: No acute fracture or elbow joint effusion.  Degenerative osteoarthritis of the elbow joint.  Well corticated ossific bodies just radial to the lateral humeral condyle suggest prior lateral epicondylitis.  IMPRESSION:  1.  No acute fracture, malalignment or elbow joint effusion. 2.  Degenerative osteoarthritis of the elbow. 3.  Sequela of probable remote lateral epicondylitis.   Original Report Authenticated By: Malachy Moan, [name]    Dg Knee Complete 4 Views Left  01/19/2013  *RADIOLOGY REPORT*  Clinical Data: Fall.  Anterior knee pain and bruising.  LEFT KNEE - COMPLETE 4+ VIEW  Comparison: None.  Findings: The patient is status post left total knee arthroplasty. The knee is located.  No acute bone or soft tissue abnormality is present.  Atherosclerotic calcifications are present in the popliteal artery.  IMPRESSION:  1.  Status post left total knee arthroplasty without radiographic evidence for complication. 2.  Atherosclerosis.   Original Report Authenticated By: Marin Roberts,  [name]    Dg Knee Complete 4 Views Right  01/19/2013  *RADIOLOGY REPORT*  Clinical Data: Fall  RIGHT KNEE - COMPLETE 4+ VIEW  Comparison: Prior CT scan 09/01/2006  Findings: Surgical changes of prior total knee arthroplasty with long stem femoral and tibial component.  Of note, the distal tibial component is incompletely imaged.  No evidence of acute hardware complication or periprosthetic fracture.  The bones are mildly osteopenic.  Atherosclerotic calcifications noted throughout the superficial femoral artery.  Joint alignment appears anatomic. Cement versus bony debris noted about the joint. No joint effusion.  IMPRESSION: No acute abnormality.  Prior total knee arthroplasty.   Original Report Authenticated By: Malachy Moan, [name]    Dg Finger Thumb Left  01/19/2013  *RADIOLOGY REPORT*  Clinical Data: Fall, post reduction  LEFT THUMB 2+V  Comparison: Prereduction radiographs obtained earlier today at 1924 hours  Findings: Successful reduction of the thumb interphalangeal joint dislocation.  There is a mildly displaced fracture through the ulnar aspect of the base of the distal phalanx of the thumb.  There is associated soft tissue swelling.  The bones are mildly osteopenic.  There is advanced degenerative osteoarthritis of the STT and first and second CMC joints.  IMPRESSION:  1.  Successful reduction of  interphalangeal joint dislocation of the thumb. 2.  Mildly displaced fracture through the ulnar aspect of the base of the distal phalanx of the thumb. 3.  Advanced STT and first and second CMC joint osteoarthritis.   Original Report Authenticated By: Malachy Moan, [name]    Dg Finger Thumb Left  01/19/2013  *RADIOLOGY REPORT*  Clinical Data: Fall.  Deformity of the left thumb.  LEFT THUMB 2+V  Comparison: None available.  Findings: A fracture dislocation is present at the IP joint of the thumb. The fracture is at the ulnar aspect of the base of the distal phalanx.  The dislocation is ventral and  radial.  There is significant subluxation of the first [facility] joint.  Osteopenia is noted.  IMPRESSION:  1.  Fracture dislocation of the IP joint of the thumb as described. 2.  Advanced degenerative subluxation at the first [facility] joint.   Original Report Authenticated By: Marin Roberts, [name]      1. Fracture dislocation of thumb, left, closed, initial encounter   2. Knee contusion, unspecified laterality, initial encounter   3. Elbow abrasion, right, initial encounter       MDM  Fall with multiple abrasions and dislocation of left thumb. No loss of consciousness. No neck pain.  Digital block performed the thumb reduced.  Patient's thumb fracture dislocation discussed with on-call hand surgeon Dr. Janee Morn. Agrees with splinting and followup in clinic. He suspects this will be a injury that subluxes continuously.  Knee and elbow xrays negative for fracture.  Tetanus up to date.   Ambulatory without difficulty.     Glynn Octave, MD 01/19/13 [phone]

## 2013-01-19 NOTE — ED Notes (Signed)
Patient transported to X-ray 

## 2013-01-21 DIAGNOSIS — S62639A Displaced fracture of distal phalanx of unspecified finger, initial encounter for closed fracture: Secondary | ICD-10-CM | POA: Diagnosis not present

## 2013-02-04 DIAGNOSIS — S62639A Displaced fracture of distal phalanx of unspecified finger, initial encounter for closed fracture: Secondary | ICD-10-CM | POA: Diagnosis not present

## 2013-02-28 DIAGNOSIS — S62639A Displaced fracture of distal phalanx of unspecified finger, initial encounter for closed fracture: Secondary | ICD-10-CM | POA: Diagnosis not present

## 2013-03-08 DIAGNOSIS — D649 Anemia, unspecified: Secondary | ICD-10-CM | POA: Diagnosis not present

## 2013-03-08 DIAGNOSIS — E782 Mixed hyperlipidemia: Secondary | ICD-10-CM | POA: Diagnosis not present

## 2013-03-08 DIAGNOSIS — I10 Essential (primary) hypertension: Secondary | ICD-10-CM | POA: Diagnosis not present

## 2013-03-08 DIAGNOSIS — E538 Deficiency of other specified B group vitamins: Secondary | ICD-10-CM | POA: Diagnosis not present

## 2013-03-08 DIAGNOSIS — E119 Type 2 diabetes mellitus without complications: Secondary | ICD-10-CM | POA: Diagnosis not present

## 2013-03-08 DIAGNOSIS — Z79899 Other long term (current) drug therapy: Secondary | ICD-10-CM | POA: Diagnosis not present

## 2013-03-09 ENCOUNTER — Ambulatory Visit (HOSPITAL_COMMUNITY)
Admission: RE | Admit: 2013-03-09 | Discharge: 2013-03-09 | Disposition: A | Discharge status: Home / Self Care | Payer: Medicare Other | Source: Ambulatory Visit | Attending: Internal Medicine | Admitting: Internal Medicine

## 2013-03-09 ENCOUNTER — Other Ambulatory Visit (HOSPITAL_COMMUNITY): Payer: Self-pay | Admitting: Internal Medicine

## 2013-03-09 DIAGNOSIS — R0602 Shortness of breath: Secondary | ICD-10-CM | POA: Diagnosis not present

## 2013-03-09 DIAGNOSIS — R61 Generalized hyperhidrosis: Secondary | ICD-10-CM | POA: Diagnosis not present

## 2013-03-09 DIAGNOSIS — R911 Solitary pulmonary nodule: Secondary | ICD-10-CM | POA: Diagnosis not present

## 2013-03-09 DIAGNOSIS — I10 Essential (primary) hypertension: Secondary | ICD-10-CM | POA: Diagnosis not present

## 2013-03-29 ENCOUNTER — Other Ambulatory Visit: Payer: Self-pay | Admitting: Internal Medicine

## 2013-03-29 DIAGNOSIS — N6459 Other signs and symptoms in breast: Secondary | ICD-10-CM

## 2013-04-06 DIAGNOSIS — S62639A Displaced fracture of distal phalanx of unspecified finger, initial encounter for closed fracture: Secondary | ICD-10-CM | POA: Diagnosis not present

## 2013-04-29 ENCOUNTER — Ambulatory Visit
Admission: RE | Admit: 2013-04-29 | Discharge: 2013-04-29 | Disposition: A | Discharge status: Home / Self Care | Payer: Medicare Other | Source: Ambulatory Visit | Attending: Internal Medicine | Admitting: Internal Medicine

## 2013-04-29 DIAGNOSIS — N6459 Other signs and symptoms in breast: Secondary | ICD-10-CM

## 2013-04-29 DIAGNOSIS — R922 Inconclusive mammogram: Secondary | ICD-10-CM | POA: Diagnosis not present

## 2013-05-05 DIAGNOSIS — M25559 Pain in unspecified hip: Secondary | ICD-10-CM | POA: Diagnosis not present

## 2013-05-05 DIAGNOSIS — M25569 Pain in unspecified knee: Secondary | ICD-10-CM | POA: Diagnosis not present

## 2013-05-05 DIAGNOSIS — M4310 Spondylolisthesis, site unspecified: Secondary | ICD-10-CM | POA: Diagnosis not present

## 2013-05-11 DIAGNOSIS — M545 Low back pain: Secondary | ICD-10-CM | POA: Diagnosis not present

## 2013-05-17 DIAGNOSIS — M545 Low back pain: Secondary | ICD-10-CM | POA: Diagnosis not present

## 2013-05-17 DIAGNOSIS — M25559 Pain in unspecified hip: Secondary | ICD-10-CM | POA: Diagnosis not present

## 2013-05-19 DIAGNOSIS — M545 Low back pain: Secondary | ICD-10-CM | POA: Diagnosis not present

## 2013-05-24 DIAGNOSIS — M545 Low back pain: Secondary | ICD-10-CM | POA: Diagnosis not present

## 2013-05-26 DIAGNOSIS — M545 Low back pain: Secondary | ICD-10-CM | POA: Diagnosis not present

## 2013-05-26 DIAGNOSIS — M48061 Spinal stenosis, lumbar region without neurogenic claudication: Secondary | ICD-10-CM | POA: Diagnosis not present

## 2013-05-31 DIAGNOSIS — I10 Essential (primary) hypertension: Secondary | ICD-10-CM | POA: Diagnosis not present

## 2013-05-31 DIAGNOSIS — E559 Vitamin D deficiency, unspecified: Secondary | ICD-10-CM | POA: Diagnosis not present

## 2013-05-31 DIAGNOSIS — E782 Mixed hyperlipidemia: Secondary | ICD-10-CM | POA: Diagnosis not present

## 2013-05-31 DIAGNOSIS — D649 Anemia, unspecified: Secondary | ICD-10-CM | POA: Diagnosis not present

## 2013-05-31 DIAGNOSIS — R7309 Other abnormal glucose: Secondary | ICD-10-CM | POA: Diagnosis not present

## 2013-05-31 DIAGNOSIS — Z79899 Other long term (current) drug therapy: Secondary | ICD-10-CM | POA: Diagnosis not present

## 2013-06-03 DIAGNOSIS — M47817 Spondylosis without myelopathy or radiculopathy, lumbosacral region: Secondary | ICD-10-CM | POA: Diagnosis not present

## 2013-06-08 DIAGNOSIS — M48061 Spinal stenosis, lumbar region without neurogenic claudication: Secondary | ICD-10-CM | POA: Diagnosis not present

## 2013-06-13 DIAGNOSIS — M48061 Spinal stenosis, lumbar region without neurogenic claudication: Secondary | ICD-10-CM | POA: Diagnosis not present

## 2013-07-07 DIAGNOSIS — IMO0002 Reserved for concepts with insufficient information to code with codable children: Secondary | ICD-10-CM | POA: Diagnosis not present

## 2013-07-26 DIAGNOSIS — IMO0002 Reserved for concepts with insufficient information to code with codable children: Secondary | ICD-10-CM | POA: Diagnosis not present

## 2013-08-04 DIAGNOSIS — M25549 Pain in joints of unspecified hand: Secondary | ICD-10-CM | POA: Diagnosis not present

## 2013-08-17 DIAGNOSIS — M48061 Spinal stenosis, lumbar region without neurogenic claudication: Secondary | ICD-10-CM | POA: Diagnosis not present

## 2013-08-25 DIAGNOSIS — M48061 Spinal stenosis, lumbar region without neurogenic claudication: Secondary | ICD-10-CM | POA: Diagnosis not present

## 2013-09-05 DIAGNOSIS — E1129 Type 2 diabetes mellitus with other diabetic kidney complication: Secondary | ICD-10-CM | POA: Diagnosis not present

## 2013-09-05 DIAGNOSIS — I10 Essential (primary) hypertension: Secondary | ICD-10-CM | POA: Diagnosis not present

## 2013-09-05 DIAGNOSIS — E782 Mixed hyperlipidemia: Secondary | ICD-10-CM | POA: Diagnosis not present

## 2013-09-05 DIAGNOSIS — Z23 Encounter for immunization: Secondary | ICD-10-CM | POA: Diagnosis not present

## 2013-09-05 DIAGNOSIS — D649 Anemia, unspecified: Secondary | ICD-10-CM | POA: Diagnosis not present

## 2013-09-05 DIAGNOSIS — Z79899 Other long term (current) drug therapy: Secondary | ICD-10-CM | POA: Diagnosis not present

## 2013-09-05 DIAGNOSIS — E559 Vitamin D deficiency, unspecified: Secondary | ICD-10-CM | POA: Diagnosis not present

## 2013-09-05 DIAGNOSIS — E538 Deficiency of other specified B group vitamins: Secondary | ICD-10-CM | POA: Diagnosis not present

## 2013-09-12 ENCOUNTER — Encounter: Payer: Self-pay | Admitting: Internal Medicine

## 2013-09-19 ENCOUNTER — Encounter: Payer: Self-pay | Admitting: Physician Assistant

## 2013-09-19 ENCOUNTER — Ambulatory Visit: Payer: Medicare Other | Admitting: Physician Assistant

## 2013-09-19 VITALS — BP 130/72 | HR 60 | Temp 97.9°F | Resp 16 | Wt 160.0 lb

## 2013-09-19 DIAGNOSIS — I10 Essential (primary) hypertension: Secondary | ICD-10-CM | POA: Diagnosis not present

## 2013-09-19 DIAGNOSIS — D649 Anemia, unspecified: Secondary | ICD-10-CM | POA: Diagnosis not present

## 2013-09-19 LAB — CBC WITH DIFFERENTIAL/PLATELET
Eosinophils Absolute: 0.2 10*3/uL (ref 0.0–0.7)
Eosinophils Relative: 3 % (ref 0–5)
HCT: 33.7 % — ABNORMAL LOW (ref 36.0–46.0)
Lymphocytes Relative: 31 % (ref 12–46)
Lymphs Abs: 2.3 10*3/uL (ref 0.7–4.0)
MCH: 31.5 pg (ref 26.0–34.0)
MCV: 90.6 fL (ref 78.0–100.0)
Monocytes Absolute: 0.6 10*3/uL (ref 0.1–1.0)
Platelets: 295 10*3/uL (ref 150–400)
RBC: 3.72 MIL/uL — ABNORMAL LOW (ref 3.87–5.11)
RDW: 13.2 % (ref 11.5–15.5)
WBC: 7.6 10*3/uL (ref 4.0–10.5)

## 2013-09-19 NOTE — Progress Notes (Signed)
HPI Patient presents for a one month follow up. Last visit patient's Losartan was increased to 1/2 AM and 1/2 PM. BP has been controlled and she is doing well. TMJ has improved and she has not had any headaches. She is seeing ortho for her lower back pain and got a Dexamethasone shot, currently not helping. Aleve does help.   Allergies:  Allergies  Allergen Reactions  . Augmentin [Amoxicillin-Pot Clavulanate]     vaginitis  . Codeine     rash  . Hydrocodone     rash  . Hytrin [Terazosin]     incontinence   . Keflex [Cephalexin]     nausea  . Oxycodone     rash  . Procardia [Nifedipine]     edema  . Vasotec [Enalapril]     cough    Current Medications:     Medication List       This list is accurate as of: 09/19/13  3:03 PM.  Always use your most recent med list.               alendronate 70 MG tablet  Commonly known as:  FOSAMAX  Take 70 mg by mouth every 7 (seven) days. Take with a full glass of water on an empty stomach.     aspirin 81 MG tablet  Take 81 mg by mouth daily.     atenolol 100 MG tablet  Commonly known as:  TENORMIN  Take 100 mg by mouth daily.     atorvastatin 80 MG tablet  Commonly known as:  LIPITOR  Take 80 mg by mouth daily.     cephALEXin 500 MG capsule  Commonly known as:  KEFLEX  Take 1 capsule (500 mg total) by mouth 4 (four) times daily.     citalopram 40 MG tablet  Commonly known as:  CELEXA  Take 40 mg by mouth daily.     furosemide 40 MG tablet  Commonly known as:  LASIX  Take 40 mg by mouth.     HYDROcodone-acetaminophen 5-325 MG per tablet  Commonly known as:  NORCO/VICODIN  Take 2 tablets by mouth every 4 (four) hours as needed for pain.     levothyroxine 100 MCG tablet  Commonly known as:  SYNTHROID, LEVOTHROID  Take 100 mcg by mouth daily before breakfast.     losartan 100 MG tablet  Commonly known as:  COZAAR  Take 100 mg by mouth daily. 1/2 pill daily     mexiletine 150 MG capsule  Commonly known as:   MEXITIL  Take 150 mg by mouth 3 (three) times daily.     trazodone 300 MG tablet  Commonly known as:  DESYREL  Take 300 mg by mouth at bedtime.     verapamil 240 MG (CO) 24 hr tablet  Commonly known as:  COVERA HS  Take 240 mg by mouth at bedtime.        ROS: all negative expect above.   Physical: Filed Weights   09/19/13 1449  Weight: 160 lb (72.576 kg)    Filed Vitals:   09/19/13 1449  BP: 130/72  Pulse: 60  Temp: 97.9 F (36.6 C)  Resp: 16   General Appearance: Well nourished, in no apparent distress. Eyes: PERRLA, EOMs. Sinuses: No Frontal/maxillary tenderness ENT/Mouth: Ext aud canals clear, normal light reflex with TMs without erythema, bulging. Post pharynx without erythema, swelling, exudate.  Respiratory: CTAB Cardio: RRR, no murmurs, rubs or gallops. Peripheral pulses brisk and equal bilaterally, without edema. No  aortic or femoral bruits. Abdomen: Flat, soft, with bowl sounds. Nontender, no guarding, rebound. Lymphatics: Non tender without lymphadenopathy.  Musculoskeletal: Full ROM all peripheral extremities, 5/5 strength, and normal gait. Skin: Warm, dry without rashes, lesions, ecchymosis.  Neuro: Cranial nerves intact, reflexes equal bilaterally. Normal muscle tone, no cerebellar symptoms. Sensation intact.  Pysch: Awake and oriented X 3, normal affect, Insight and Judgment appropriate.   Assessment and Plan: 1. HYPERTENSION - continue medications the same and monitor BP at home.  - BASIC METABOLIC PANEL WITH GFR  2. Anemia Patient had a decreased H/H last visit with normal iron and B12.  - CBC with Differential - Folate RBC

## 2013-09-19 NOTE — Patient Instructions (Signed)
DASH Diet  The DASH diet stands for "Dietary Approaches to Stop Hypertension." It is a healthy eating plan that has been shown to reduce high blood pressure (hypertension) in as little as 14 days, while also possibly providing other significant health benefits. These other health benefits include reducing the risk of breast cancer after menopause and reducing the risk of type 2 diabetes, heart disease, colon cancer, and stroke. Health benefits also include weight loss and slowing kidney failure in patients with chronic kidney disease.   DIET GUIDELINES  · Limit salt (sodium). Your diet should contain less than 1500 mg of sodium daily.  · Limit refined or processed carbohydrates. Your diet should include mostly whole grains. Desserts and added sugars should be used sparingly.  · Include small amounts of heart-healthy fats. These types of fats include nuts, oils, and tub margarine. Limit saturated and trans fats. These fats have been shown to be harmful in the body.  CHOOSING FOODS   The following food groups are based on a 2000 calorie diet. See your Registered Dietitian for individual calorie needs.  Grains and Grain Products (6 to 8 servings daily)  · Eat More Often: Whole-wheat bread, brown rice, whole-grain or wheat pasta, quinoa, popcorn without added fat or salt (air popped).  · Eat Less Often: White bread, white pasta, white rice, cornbread.  Vegetables (4 to 5 servings daily)  · Eat More Often: Fresh, frozen, and canned vegetables. Vegetables may be raw, steamed, roasted, or grilled with a minimal amount of fat.  · Eat Less Often/Avoid: Creamed or fried vegetables. Vegetables in a cheese sauce.  Fruit (4 to 5 servings daily)  · Eat More Often: All fresh, canned (in natural juice), or frozen fruits. Dried fruits without added sugar. One hundred percent fruit juice (½ cup [237 mL] daily).  · Eat Less Often: Dried fruits with added sugar. Canned fruit in light or heavy syrup.  Lean Meats, Fish, and Poultry (2  servings or less daily. One serving is 3 to 4 oz [85-114 g]).  · Eat More Often: Ninety percent or leaner ground beef, tenderloin, sirloin. Round cuts of beef, chicken breast, turkey breast. All fish. Grill, bake, or broil your meat. Nothing should be fried.  · Eat Less Often/Avoid: Fatty cuts of meat, turkey, or chicken leg, thigh, or wing. Fried cuts of meat or fish.  Dairy (2 to 3 servings)  · Eat More Often: Low-fat or fat-free milk, low-fat plain or light yogurt, reduced-fat or part-skim cheese.  · Eat Less Often/Avoid: Milk (whole, 2%). Whole milk yogurt. Full-fat cheeses.  Nuts, Seeds, and Legumes (4 to 5 servings per week)  · Eat More Often: All without added salt.  · Eat Less Often/Avoid: Salted nuts and seeds, canned beans with added salt.  Fats and Sweets (limited)  · Eat More Often: Vegetable oils, tub margarines without trans fats, sugar-free gelatin. Mayonnaise and salad dressings.  · Eat Less Often/Avoid: Coconut oils, palm oils, butter, stick margarine, cream, half and half, cookies, candy, pie.  FOR MORE INFORMATION  The Dash Diet Eating Plan: www.dashdiet.org  Document Released: 10/16/2011 Document Revised: 01/19/2012 Document Reviewed: 10/16/2011  [employer]® Patient Information ©2014 [employer].

## 2013-09-20 ENCOUNTER — Other Ambulatory Visit: Payer: Self-pay | Admitting: Physician Assistant

## 2013-09-20 ENCOUNTER — Other Ambulatory Visit: Payer: Self-pay | Admitting: Internal Medicine

## 2013-09-20 ENCOUNTER — Telehealth: Payer: Self-pay

## 2013-09-20 LAB — BASIC METABOLIC PANEL WITH GFR
BUN: 15 mg/dL (ref 6–23)
CO2: 33 mEq/L — ABNORMAL HIGH (ref 19–32)
Calcium: 9.6 mg/dL (ref 8.4–10.5)
Chloride: 101 mEq/L (ref 96–112)
Creat: 1.03 mg/dL (ref 0.50–1.10)

## 2013-09-20 MED ORDER — ATENOLOL 100 MG PO TABS: 100.0000 mg | ORAL_TABLET | Freq: Every day | ORAL | Status: DC

## 2013-09-20 MED ORDER — OMEPRAZOLE 20 MG PO CPDR: 20.0000 mg | DELAYED_RELEASE_CAPSULE | Freq: Two times a day (BID) | ORAL | Status: DC

## 2013-09-20 MED ORDER — FUROSEMIDE 40 MG PO TABS: 40.0000 mg | ORAL_TABLET | Freq: Every day | ORAL | Status: DC

## 2013-09-20 MED ORDER — LEVOTHYROXINE SODIUM 100 MCG PO TABS: 100.0000 ug | ORAL_TABLET | Freq: Every day | ORAL | Status: DC

## 2013-09-20 NOTE — Telephone Encounter (Signed)
Message copied by Linwood Dibbles on Tue Sep 20, 2013  5:05 PM ------      Message from: Quentin Mulling R      Created: Tue Sep 20, 2013  3:17 PM       H/H is still low but stable. We will continue to monitor. She has normal B12, folate, and iron. Kidney function is okay. ------

## 2013-09-26 DIAGNOSIS — M545 Low back pain: Secondary | ICD-10-CM | POA: Diagnosis not present

## 2013-11-09 DIAGNOSIS — M48061 Spinal stenosis, lumbar region without neurogenic claudication: Secondary | ICD-10-CM | POA: Diagnosis not present

## 2013-11-16 ENCOUNTER — Encounter: Payer: Self-pay | Admitting: Internal Medicine

## 2013-11-20 ENCOUNTER — Other Ambulatory Visit: Payer: Self-pay | Admitting: Internal Medicine

## 2013-11-20 DIAGNOSIS — J0410 Acute tracheitis without obstruction: Secondary | ICD-10-CM

## 2013-11-20 MED ORDER — AZITHROMYCIN 250 MG PO TABS: ORAL_TABLET | ORAL | Status: AC

## 2013-11-20 MED ORDER — PREDNISONE 10 MG PO TABS: ORAL_TABLET | ORAL | Status: DC

## 2013-12-02 ENCOUNTER — Ambulatory Visit (INDEPENDENT_AMBULATORY_CARE_PROVIDER_SITE_OTHER): Payer: Medicare Other | Admitting: Internal Medicine

## 2013-12-02 ENCOUNTER — Encounter: Payer: Self-pay | Admitting: Internal Medicine

## 2013-12-02 VITALS — BP 132/76 | HR 60 | Temp 98.1°F | Resp 16 | Ht 62.5 in | Wt 159.8 lb

## 2013-12-02 DIAGNOSIS — E039 Hypothyroidism, unspecified: Secondary | ICD-10-CM

## 2013-12-02 DIAGNOSIS — I10 Essential (primary) hypertension: Secondary | ICD-10-CM | POA: Diagnosis not present

## 2013-12-02 DIAGNOSIS — Z0000 Encounter for general adult medical examination without abnormal findings: Secondary | ICD-10-CM

## 2013-12-02 DIAGNOSIS — E559 Vitamin D deficiency, unspecified: Secondary | ICD-10-CM | POA: Diagnosis not present

## 2013-12-02 DIAGNOSIS — Z79899 Other long term (current) drug therapy: Secondary | ICD-10-CM | POA: Diagnosis not present

## 2013-12-02 DIAGNOSIS — E782 Mixed hyperlipidemia: Secondary | ICD-10-CM

## 2013-12-02 DIAGNOSIS — R7309 Other abnormal glucose: Secondary | ICD-10-CM

## 2013-12-02 DIAGNOSIS — G4700 Insomnia, unspecified: Secondary | ICD-10-CM

## 2013-12-02 DIAGNOSIS — K2100 Gastro-esophageal reflux disease with esophagitis, without bleeding: Secondary | ICD-10-CM

## 2013-12-02 DIAGNOSIS — M1990 Unspecified osteoarthritis, unspecified site: Secondary | ICD-10-CM | POA: Insufficient documentation

## 2013-12-02 DIAGNOSIS — Z1212 Encounter for screening for malignant neoplasm of rectum: Secondary | ICD-10-CM

## 2013-12-02 LAB — HEMOGLOBIN A1C
HEMOGLOBIN A1C: 6.7 % — AB (ref <5.7)
MEAN PLASMA GLUCOSE: 146 mg/dL — AB (ref <117)

## 2013-12-02 LAB — CBC WITH DIFFERENTIAL/PLATELET
Basophils Absolute: 0.1 10*3/uL (ref 0.0–0.1)
Basophils Relative: 1 % (ref 0–1)
EOS ABS: 0.3 10*3/uL (ref 0.0–0.7)
Eosinophils Relative: 3 % (ref 0–5)
HCT: 38.1 % (ref 36.0–46.0)
HEMOGLOBIN: 12.5 g/dL (ref 12.0–15.0)
LYMPHS ABS: 2.4 10*3/uL (ref 0.7–4.0)
Lymphocytes Relative: 23 % (ref 12–46)
MCH: 29.9 pg (ref 26.0–34.0)
MCHC: 32.8 g/dL (ref 30.0–36.0)
MCV: 91.1 fL (ref 78.0–100.0)
Monocytes Absolute: 0.7 10*3/uL (ref 0.1–1.0)
Monocytes Relative: 6 % (ref 3–12)
NEUTROS ABS: 7.1 10*3/uL (ref 1.7–7.7)
Neutrophils Relative %: 67 % (ref 43–77)
Platelets: 331 10*3/uL (ref 150–400)
RBC: 4.18 MIL/uL (ref 3.87–5.11)
RDW: 13.4 % (ref 11.5–15.5)
WBC: 10.5 10*3/uL (ref 4.0–10.5)

## 2013-12-02 LAB — LIPID PANEL
Cholesterol: 164 mg/dL (ref 0–200)
HDL: 57 mg/dL (ref >39)
LDL Cholesterol: 72 mg/dL (ref 0–99)
TRIGLYCERIDES: 174 mg/dL — AB (ref <150)
Total CHOL/HDL Ratio: 2.9 Ratio
VLDL: 35 mg/dL (ref 0–40)

## 2013-12-02 LAB — MAGNESIUM: Magnesium: 2 mg/dL (ref 1.5–2.5)

## 2013-12-02 LAB — BASIC METABOLIC PANEL WITH GFR
BUN: 18 mg/dL (ref 6–23)
CHLORIDE: 102 meq/L (ref 96–112)
CO2: 32 mEq/L (ref 19–32)
Calcium: 9.4 mg/dL (ref 8.4–10.5)
Creat: 1.01 mg/dL (ref 0.50–1.10)
GFR, EST AFRICAN AMERICAN: 61 mL/min
GFR, Est Non African American: 53 mL/min — ABNORMAL LOW
GLUCOSE: 106 mg/dL — AB (ref 70–99)
POTASSIUM: 4 meq/L (ref 3.5–5.3)
Sodium: 141 mEq/L (ref 135–145)

## 2013-12-02 LAB — HEPATIC FUNCTION PANEL
ALT: 16 U/L (ref 0–35)
AST: 21 U/L (ref 0–37)
Albumin: 4.2 g/dL (ref 3.5–5.2)
Alkaline Phosphatase: 59 U/L (ref 39–117)
BILIRUBIN DIRECT: 0.1 mg/dL (ref 0.0–0.3)
BILIRUBIN TOTAL: 0.4 mg/dL (ref 0.3–1.2)
Indirect Bilirubin: 0.3 mg/dL (ref 0.0–0.9)
Total Protein: 6.8 g/dL (ref 6.0–8.3)

## 2013-12-02 MED ORDER — LORAZEPAM 2 MG/ML PO CONC: ORAL | Status: DC

## 2013-12-02 MED ORDER — OXYBUTYNIN CHLORIDE 5 MG PO TABS: 5.0000 mg | ORAL_TABLET | Freq: Three times a day (TID) | ORAL | Status: DC

## 2013-12-02 NOTE — Progress Notes (Signed)
Patient ID: [name], female   DOB: 1933-04-06, 80 y.o.   MRN: [id number]  Annual Screening Comprehensive Examination  This very nice 80 y.o. WWF presents for complete physical.  Patient has been followed for HTN,DJD,  Prediabetes, Hyperlipidemia, and Vitamin D Deficiency.    HTN predates since 581987. Patient's BP has been controlled at home. Today's BP: 132/76 mmHg. Patient denies any cardiac symptoms as chest pain, palpitations, shortness of breath, dizziness or ankle swelling.   Patient's hyperlipidemia is controlled with diet and medications. Patient denies myalgias or other medication SE's. Last cholesterol last visit was 150, triglycerides 88, HDL 57 and LDL  75 in Oct 2014 - all at goal.     Patient has prediabetes presenting with A1c 7.8 % in June 2011. She has improved control on diet alone with last A1c 6.6 % in Oct 2014.  Patient denies reactive hypoglycemic symptoms, visual blurring, diabetic polys, or paresthesias.    Patient has DJD and underwent Rt TKR x 2 in 2006 & 2007 and most recently a Lt TKR in Sept 2012.   Finally, patient has history of Vitamin D Deficiency 24 in 2009 with last vitamin D 57 in Oct 2014. Other problems include Hypothyroidism, GERD, Hx/o Iron & B12 Deficiency, and Anxiety- Depression.       Medication List       alendronate 70 MG tablet  Commonly known as:  FOSAMAX  Take 70 mg by mouth every 7 (seven) days. Take with a full glass of water on an empty stomach.     aspirin 81 MG tablet  Take 81 mg by mouth daily.     atenolol 100 MG tablet  Commonly known as:  TENORMIN  Take 1 tablet (100 mg total) by mouth daily.     atorvastatin 80 MG tablet  Commonly known as:  LIPITOR  Take 80 mg by mouth daily.     citalopram 40 MG tablet  Commonly known as:  CELEXA  Take 40 mg by mouth daily.     furosemide 40 MG tablet  Commonly known as:  LASIX  Take 1 tablet (40 mg total) by mouth daily.     levothyroxine 100 MCG tablet  Commonly known as:   SYNTHROID, LEVOTHROID  Take 1 tablet (100 mcg total) by mouth daily before breakfast.     LORazepam 2 MG/ML concentrated solution  Commonly known as:  LORAZEPAM INTENSOL  1/2 to 1 tablet at bedtime as needed for sleep     losartan 100 MG tablet  Commonly known as:  COZAAR  Take 100 mg by mouth daily. 1/2 pill daily     mexiletine 150 MG capsule  Commonly known as:  MEXITIL  Take 150 mg by mouth 3 (three) times daily.     oxybutynin 5 MG tablet  Commonly known as:  DITROPAN  Take 1 tablet (5 mg total) by mouth 3 (three) times daily. For bladder control     verapamil 240 MG (CO) 24 hr tablet  Commonly known as:  COVERA HS  Take 240 mg by mouth at bedtime.        Allergies  Allergen Reactions  . Augmentin [Amoxicillin-Pot Clavulanate]     vaginitis  . Codeine     rash  . Hydrocodone     rash  . Hytrin [Terazosin]     incontinence   . Keflex [Cephalexin]     nausea  . Oxycodone     rash  . Procardia [Nifedipine]  edema  . Vasotec [Enalapril]     cough    Past Medical History  Diagnosis Date  . Hypertension   . Hyperlipidemia   . Anxiety   . Depression   . Thyroid disease   . Anemia   . Type II or unspecified type diabetes mellitus with renal manifestations, not stated as uncontrolled     stage II  . History of toe surgery left  . GERD (gastroesophageal reflux disease)     Past Surgical History  Procedure Laterality Date  . Knee surgery    . Abdominal hysterectomy    . Tonsillectomy    . Appendectomy    . Cholecystectomy    . Cerebral aneurysm repair  09/2007    coil ablation  . Joint replacement      right TKR X 2, Left TKR X1    Family History  Problem Relation Age of Onset  . Cancer Mother     breast cancer  . Hypertension Mother   . Mental illness Mother     alz  . Cancer Father     colon cancer  . Hypertension Father   . Cancer Maternal Aunt     breast cancer with mets    History  Substance Use Topics  . Smoking status:  Never Smoker   . Smokeless tobacco: Never Used  . Alcohol Use: No    ROS Constitutional: Denies fever, chills, weight loss/gain, headaches, insomnia, fatigue, night sweats, and change in appetite. Eyes: Denies redness, blurred vision, diplopia, discharge, itchy, watery eyes.  ENT: Denies discharge, congestion, post nasal drip, epistaxis, sore throat, earache, hearing loss, dental pain, Tinnitus, Vertigo, Sinus pain, snoring.  Cardio: Denies chest pain, palpitations, irregular heartbeat, syncope, dyspnea, diaphoresis, orthopnea, PND, claudication, edema Respiratory: denies cough, dyspnea, DOE, pleurisy, hoarseness, laryngitis, wheezing.  Gastrointestinal: Denies dysphagia, heartburn, reflux, water brash, pain, cramps, nausea, vomiting, bloating, diarrhea, constipation, hematemesis, melena, hematochezia, jaundice, hemorrhoids Genitourinary: Denies dysuria, nocturia, hesitancy, discharge, hematuria, flank pain. C/o urge incontinance Sx's. Breast:Breast lumps, nipple discharge, bleeding.  Musculoskeletal: Denies arthralgia, myalgia, stiffness, Jt. Swelling, pain, limp, and strain/sprain. Skin: Denies puritis, rash, hives, warts, acne, eczema, changing in skin lesion Neuro: No weakness, tremor, incoordination, spasms, paresthesia, pain Psychiatric: Denies confusion, memory loss, sensory loss Endocrine: Denies change in weight, skin, hair change, nocturia, and paresthesia, diabetic polys, visual blurring, hyper / hypo glycemic episodes.  Heme/Lymph: No excessive bleeding, bruising, enlarged lymph nodes.  BP: 132/76  Pulse: 60  Temp: 98.1 F (36.7 C)  Resp: 16    Estimated body mass index is 28.74 kg/(m^2) as calculated from the following:   Height as of this encounter: 5' 2.5" (1.588 m).   Weight as of this encounter: 159 lb 12.8 oz (72.485 kg).  Physical Exam General Appearance: Well nourished, in no apparent distress. Eyes: PERRLA, EOMs, conjunctiva no swelling or erythema, normal fundi  and vessels. Sinuses: No frontal/maxillary tenderness ENT/Mouth: EACs patent / TMs  nl. Nares clear without erythema, swelling, mucoid exudates. Oral hygiene is good. No erythema, swelling, or exudate. Tongue normal, non-obstructing. Tonsils not swollen or erythematous. Hearing normal.  Neck: Supple, thyroid normal. No bruits, nodes or JVD. Respiratory: Respiratory effort normal.  BS equal and clear bilateral without rales, rhonci, wheezing or stridor. Cardio: Heart sounds are normal with regular rate and rhythm and no murmurs, rubs or gallops. Peripheral pulses are normal and equal bilaterally without edema. No aortic or femoral bruits. Chest: symmetric with normal excursions and percussion. Breasts: Symmetric, without lumps, nipple discharge,  retractions, or fibrocystic changes.  Abdomen: Flat, soft, with bowl sounds. Nontender, no guarding, rebound, hernias, masses, or organomegaly.  Lymphatics: Non tender without lymphadenopathy. Musculoskeletal: Full ROM all peripheral extremities, joint stability, 5/5 strength, and normal gait. Noted vertical scars over Bilat knees. Skin: Warm and dry without rashes, lesions, cyanosis, clubbing or  ecchymosis.  Neuro: Cranial nerves intact, reflexes equal bilaterally. Normal muscle tone, no cerebellar symptoms. Sensation intact.  Pysch: Awake and oriented X 3, normal affect, Insight and Judgment appropriate.   Assessment and Plan  1. Annual Screening Examination 2. Hypertension  3. Hyperlipidemia 4. Pre Diabetes 5. Vitamin D Deficiency 6. Hypothyroidism 7. Anxiety-Depression 8. OAB - try Oxybutynin 5 mg  Continue prudent diet as discussed, weight control, BP monitoring, regular exercise, and medications. Discussed med's effects and SE's. Screening labs and tests as requested with regular follow-up as recommended.

## 2013-12-02 NOTE — Patient Instructions (Signed)

## 2013-12-03 LAB — URINALYSIS, MICROSCOPIC ONLY
Bacteria, UA: NONE SEEN
CRYSTALS: NONE SEEN
Casts: NONE SEEN

## 2013-12-03 LAB — MICROALBUMIN / CREATININE URINE RATIO
Creatinine, Urine: 67.5 mg/dL
MICROALB/CREAT RATIO: 14.4 mg/g (ref 0.0–30.0)
Microalb, Ur: 0.97 mg/dL (ref 0.00–1.89)

## 2013-12-03 LAB — INSULIN, FASTING: INSULIN FASTING, SERUM: 12 u[IU]/mL (ref 3–28)

## 2013-12-03 LAB — VITAMIN D 25 HYDROXY (VIT D DEFICIENCY, FRACTURES): Vit D, 25-Hydroxy: 54 ng/mL (ref 30–89)

## 2013-12-03 LAB — TSH: TSH: 0.721 u[IU]/mL (ref 0.350–4.500)

## 2013-12-12 ENCOUNTER — Telehealth: Payer: Self-pay | Admitting: *Deleted

## 2013-12-12 NOTE — Telephone Encounter (Signed)
Patient called. Unable to sleep since taking  Ativan.  Per Dr Oneta RackMcKeown, take 1 whole tab at bedtime and can add Benadrly 25 mg 1-2 at bedtime also.  Patient aware.

## 2013-12-13 ENCOUNTER — Other Ambulatory Visit: Payer: Self-pay | Admitting: Internal Medicine

## 2013-12-22 ENCOUNTER — Other Ambulatory Visit (INDEPENDENT_AMBULATORY_CARE_PROVIDER_SITE_OTHER): Payer: Medicare Other | Admitting: *Deleted

## 2013-12-22 DIAGNOSIS — Z1212 Encounter for screening for malignant neoplasm of rectum: Secondary | ICD-10-CM

## 2013-12-22 LAB — POC HEMOCCULT BLD/STL (HOME/3-CARD/SCREEN)
Card #3 Fecal Occult Blood, POC: NEGATIVE
FECAL OCCULT BLD: NEGATIVE
FECAL OCCULT BLD: NEGATIVE

## 2014-01-04 DIAGNOSIS — M545 Low back pain, unspecified: Secondary | ICD-10-CM | POA: Diagnosis not present

## 2014-01-24 ENCOUNTER — Other Ambulatory Visit: Payer: Self-pay | Admitting: Internal Medicine

## 2014-02-06 DIAGNOSIS — M545 Low back pain, unspecified: Secondary | ICD-10-CM | POA: Diagnosis not present

## 2014-02-08 DIAGNOSIS — M545 Low back pain, unspecified: Secondary | ICD-10-CM | POA: Diagnosis not present

## 2014-02-16 DIAGNOSIS — M545 Low back pain, unspecified: Secondary | ICD-10-CM | POA: Diagnosis not present

## 2014-02-17 DIAGNOSIS — M545 Low back pain, unspecified: Secondary | ICD-10-CM | POA: Diagnosis not present

## 2014-02-21 DIAGNOSIS — M545 Low back pain, unspecified: Secondary | ICD-10-CM | POA: Diagnosis not present

## 2014-02-23 DIAGNOSIS — M48061 Spinal stenosis, lumbar region without neurogenic claudication: Secondary | ICD-10-CM | POA: Diagnosis not present

## 2014-02-23 DIAGNOSIS — M545 Low back pain, unspecified: Secondary | ICD-10-CM | POA: Diagnosis not present

## 2014-02-27 DIAGNOSIS — M545 Low back pain, unspecified: Secondary | ICD-10-CM | POA: Diagnosis not present

## 2014-03-01 DIAGNOSIS — M545 Low back pain, unspecified: Secondary | ICD-10-CM | POA: Diagnosis not present

## 2014-03-03 ENCOUNTER — Ambulatory Visit: Payer: Self-pay | Admitting: Physician Assistant

## 2014-03-06 DIAGNOSIS — M545 Low back pain, unspecified: Secondary | ICD-10-CM | POA: Diagnosis not present

## 2014-03-08 ENCOUNTER — Ambulatory Visit: Payer: Self-pay | Admitting: Internal Medicine

## 2014-03-08 ENCOUNTER — Encounter: Payer: Self-pay | Admitting: Physician Assistant

## 2014-03-08 ENCOUNTER — Ambulatory Visit (INDEPENDENT_AMBULATORY_CARE_PROVIDER_SITE_OTHER): Payer: Medicare Other | Admitting: Physician Assistant

## 2014-03-08 VITALS — BP 138/66 | HR 60 | Temp 98.8°F | Resp 18 | Wt 155.6 lb

## 2014-03-08 DIAGNOSIS — E782 Mixed hyperlipidemia: Secondary | ICD-10-CM | POA: Diagnosis not present

## 2014-03-08 DIAGNOSIS — Z0000 Encounter for general adult medical examination without abnormal findings: Secondary | ICD-10-CM

## 2014-03-08 DIAGNOSIS — I10 Essential (primary) hypertension: Secondary | ICD-10-CM

## 2014-03-08 DIAGNOSIS — E039 Hypothyroidism, unspecified: Secondary | ICD-10-CM

## 2014-03-08 DIAGNOSIS — Z79899 Other long term (current) drug therapy: Secondary | ICD-10-CM | POA: Diagnosis not present

## 2014-03-08 DIAGNOSIS — M810 Age-related osteoporosis without current pathological fracture: Secondary | ICD-10-CM

## 2014-03-08 DIAGNOSIS — E559 Vitamin D deficiency, unspecified: Secondary | ICD-10-CM

## 2014-03-08 DIAGNOSIS — M1990 Unspecified osteoarthritis, unspecified site: Secondary | ICD-10-CM

## 2014-03-08 DIAGNOSIS — M545 Low back pain, unspecified: Secondary | ICD-10-CM | POA: Diagnosis not present

## 2014-03-08 DIAGNOSIS — R7309 Other abnormal glucose: Secondary | ICD-10-CM | POA: Diagnosis not present

## 2014-03-08 LAB — LIPID PANEL
Cholesterol: 125 mg/dL (ref 0–200)
HDL: 50 mg/dL (ref >39)
LDL Cholesterol: 59 mg/dL (ref 0–99)
TRIGLYCERIDES: 81 mg/dL (ref <150)
Total CHOL/HDL Ratio: 2.5 Ratio
VLDL: 16 mg/dL (ref 0–40)

## 2014-03-08 LAB — CBC WITH DIFFERENTIAL/PLATELET
Basophils Absolute: 0.1 10*3/uL (ref 0.0–0.1)
Basophils Relative: 1 % (ref 0–1)
EOS ABS: 0.4 10*3/uL (ref 0.0–0.7)
Eosinophils Relative: 5 % (ref 0–5)
HCT: 34.5 % — ABNORMAL LOW (ref 36.0–46.0)
Hemoglobin: 11.8 g/dL — ABNORMAL LOW (ref 12.0–15.0)
LYMPHS ABS: 1.9 10*3/uL (ref 0.7–4.0)
LYMPHS PCT: 27 % (ref 12–46)
MCH: 30.8 pg (ref 26.0–34.0)
MCHC: 34.2 g/dL (ref 30.0–36.0)
MCV: 90.1 fL (ref 78.0–100.0)
Monocytes Absolute: 0.5 10*3/uL (ref 0.1–1.0)
Monocytes Relative: 7 % (ref 3–12)
NEUTROS PCT: 60 % (ref 43–77)
Neutro Abs: 4.3 10*3/uL (ref 1.7–7.7)
PLATELETS: 283 10*3/uL (ref 150–400)
RBC: 3.83 MIL/uL — AB (ref 3.87–5.11)
RDW: 13.4 % (ref 11.5–15.5)
WBC: 7.2 10*3/uL (ref 4.0–10.5)

## 2014-03-08 LAB — HEPATIC FUNCTION PANEL
ALBUMIN: 4.3 g/dL (ref 3.5–5.2)
ALT: 17 U/L (ref 0–35)
AST: 20 U/L (ref 0–37)
Alkaline Phosphatase: 51 U/L (ref 39–117)
BILIRUBIN TOTAL: 0.5 mg/dL (ref 0.2–1.2)
Bilirubin, Direct: 0.1 mg/dL (ref 0.0–0.3)
Indirect Bilirubin: 0.4 mg/dL (ref 0.2–1.2)
TOTAL PROTEIN: 6.4 g/dL (ref 6.0–8.3)

## 2014-03-08 LAB — BASIC METABOLIC PANEL WITH GFR
BUN: 19 mg/dL (ref 6–23)
CHLORIDE: 101 meq/L (ref 96–112)
CO2: 30 mEq/L (ref 19–32)
Calcium: 9 mg/dL (ref 8.4–10.5)
Creat: 1.13 mg/dL — ABNORMAL HIGH (ref 0.50–1.10)
GFR, Est African American: 53 mL/min — ABNORMAL LOW
GFR, Est Non African American: 46 mL/min — ABNORMAL LOW
Glucose, Bld: 115 mg/dL — ABNORMAL HIGH (ref 70–99)
Potassium: 3.8 mEq/L (ref 3.5–5.3)
SODIUM: 139 meq/L (ref 135–145)

## 2014-03-08 LAB — HEMOGLOBIN A1C
Hgb A1c MFr Bld: 6.6 % — ABNORMAL HIGH (ref <5.7)
Mean Plasma Glucose: 143 mg/dL — ABNORMAL HIGH (ref <117)

## 2014-03-08 LAB — TSH: TSH: 0.331 u[IU]/mL — ABNORMAL LOW (ref 0.350–4.500)

## 2014-03-08 LAB — MAGNESIUM: MAGNESIUM: 2 mg/dL (ref 1.5–2.5)

## 2014-03-08 NOTE — Progress Notes (Signed)
Subjective:   [name] is a 80 y.o. female who presents for Medicare Annual Wellness Visit and 3 month follow up on hypertension, diabetes, hyperlipidemia, vitamin D def.  Date of last medicare wellness visit is unknown.   Her blood pressure has been controlled at home, today their BP is BP: 138/66 mmHg She does not workout due to her pain currently.  She denies chest pain, shortness of breath, dizziness.  She is on cholesterol medication and denies myalgias. Her cholesterol is at goal. The cholesterol last visit was:   Lab Results  Component Value Date   CHOL 164 12/02/2013   HDL 57 12/02/2013   LDLCALC 72 12/02/2013   TRIG 174* 12/02/2013   CHOLHDL 2.9 12/02/2013   She has been working on diet and exercise for diabetes, and denies nausea, polydipsia and polyuria. Last A1C in the office was:  Lab Results  Component Value Date   HGBA1C 6.7* 12/02/2013   Patient is on Vitamin D supplement. She has been doing PT for her right hip and she is seeing [facility] for this, Dr. Althea Charon, she states it is not helping and she follows with him Monday.  She has been on Fosamax for about 5-6 years per patient, we will stop this.  Her biggest complaint is her arthritis, she has lower back pain in th morning that does not go away until she takes 2 aleve.   Names of Other Physician/Practitioners you currently use: 1. [facility]- here for primary care 2. Dr. Nelle Don, eye doctor, last visit 1 year ago 3. Dr. Jaynie Crumble, dentist, last visit q 6 months Patient Care Team: [name], MD as PCP - General (Internal Medicine)  Medication Review Current Outpatient Prescriptions on File Prior to Visit  Medication Sig Dispense Refill  . alendronate (FOSAMAX) 70 MG tablet Take 70 mg by mouth every 7 (seven) days. Take with a full glass of water on an empty stomach.      [name] Kitchen aspirin 81 MG tablet Take 81 mg by mouth daily.      [name] Kitchen atenolol (TENORMIN) 100 MG  tablet Take 1 tablet (100 mg total) by mouth daily.  90 tablet  4  . atorvastatin (LIPITOR) 80 MG tablet Take 80 mg by mouth daily.      . citalopram (CELEXA) 40 MG tablet Take 40 mg by mouth daily.      . furosemide (LASIX) 40 MG tablet Take 1 tablet (40 mg total) by mouth daily.  90 tablet  4  . levothyroxine (SYNTHROID, LEVOTHROID) 100 MCG tablet Take 1 tablet (100 mcg total) by mouth daily before breakfast.  90 tablet  4  . LORazepam (LORAZEPAM INTENSOL) 2 MG/ML concentrated solution 1/2 to 1 tablet at bedtime as needed for sleep  30 mL  5  . losartan (COZAAR) 100 MG tablet take 1 tablet by mouth once daily for blood pressure  30 tablet  PRN  . mexiletine (MEXITIL) 150 MG capsule Take 150 mg by mouth 3 (three) times daily.      [name] Kitchen omeprazole (PRILOSEC) 20 MG capsule       . oxybutynin (DITROPAN) 5 MG tablet Take 1 tablet (5 mg total) by mouth 3 (three) times daily. For bladder control  90 tablet  99  . trazodone (DESYREL) 300 MG tablet       . verapamil (COVERA HS) 240 MG (CO) 24 hr tablet Take 240 mg by mouth at bedtime.       No current  facility-administered medications on file prior to visit.    Current Problems (verified) Patient Active Problem List   Diagnosis Date Noted  . Hypothyroidism 12/02/2013  . GERD 12/02/2013  . PreDiabetes 12/02/2013  . Hypertension 12/02/2013  . DJD 12/02/2013  . Mixed hyperlipidemia 12/02/2013  . Routine general medical examination at a health care facility 12/02/2013  . TOTAL KNEE REPLACEMENT, HX OF 09/04/2006    Screening Tests Health Maintenance  Topic Date Due  . Zostavax  11/21/1994  . Influenza Vaccine  06/10/2014  . Colonoscopy  04/12/2020  . Tetanus/tdap  11/26/2022  . Pneumococcal Polysaccharide Vaccine Age 78 And Over  Completed     Immunization History  Administered Date(s) Administered  . Influenza-Unspecified 09/10/2013  . Pneumococcal-Unspecified 11/26/2012  . Td 11/26/2012    Preventative care: Last colonoscopy: 2011  Dr. Madilyn FiremanHayes Last mammogram: 04/2013 Last pap smear/pelvic exam: remote DEXA: 2003 DUE  Prior vaccinations: TD or Tdap: 2014  Influenza: 2014 Pneumococcal: 2014 Shingles/Zostavax: declines  History reviewed: allergies, current medications, past family history, past medical history, past social history, past surgical history and problem list  Risk Factors: Osteoporosis: postmenopausal estrogen deficiency and dietary calcium and/or vitamin D deficiency History of fracture in the past year: no  Tobacco History  Substance Use Topics  . Smoking status: Never Smoker   . Smokeless tobacco: Never Used  . Alcohol Use: No   She does not smoke.  Patient is not a former smoker. Are there smokers in your home (other than you)?  No  Alcohol Current alcohol use: none  Caffeine Current caffeine use: denies use  Exercise Exercise limitations: The patient is experiencing exercise intolerance (due to her back/hips). Current exercise: none  Nutrition/Diet Current diet: in general, a "healthy" diet    Cardiac risk factors: advanced age (older than 1555 for men, 365 for women), diabetes mellitus, dyslipidemia, hypertension and sedentary lifestyle.  Depression Screen (Note: if answer to either of the following is "Yes", a more complete depression screening is indicated)   Q1: Over the past two weeks, have you felt down, depressed or hopeless? No  Q2: Over the past two weeks, have you felt little interest or pleasure in doing things? No  Have you lost interest or pleasure in daily life? No  Do you often feel hopeless? No  Do you cry easily over simple problems? No  Activities of Daily Living In your present state of health, do you have any difficulty performing the following activities?:  Driving? No Managing money?  No Feeding yourself? No Getting from bed to chair? No Climbing a flight of stairs? No Preparing food and eating?: No Bathing or showering? No Getting dressed: No Getting  to the toilet? No Using the toilet:No Moving around from place to place: No In the past year have you fallen or had a near fall?:No   Are you sexually active?  No  Do you have more than one partner?  No  Vision Difficulties: No  Hearing Difficulties: No Do you often ask people to speak up or repeat themselves? No Do you experience ringing or noises in your ears? No Do you have difficulty understanding soft or whispered voices? No  Cognition  Do you feel that you have a problem with memory?No  Do you often misplace items? No  Do you feel safe at home?  Yes  Advanced directives Does patient have a Health Care Power of Attorney? Yes Does patient have a Living Will? Yes   Objective:   Blood pressure 138/66,  pulse 60, temperature 98.8 F (37.1 C), resp. rate 18, weight 155 lb 9.6 oz (70.58 kg). Body mass index is 27.99 kg/(m^2).  General appearance: alert, no distress, WD/WN,  female Cognitive Testing  Alert? yes  Normal Appearance? yes  Oriented to person? yes  Place? Yes   Time? Yes  Recall of three objects?  Yes  Can perform simple calculations? Yes  Displays appropriate judgment?Yes  Can read the correct time from a watch face?Yes  HEENT: normocephalic, sclerae anicteric, TMs pearly, nares patent, no discharge or erythema, pharynx normal Oral cavity: MMM, no lesions Neck: supple, no lymphadenopathy, no thyromegaly, no masses Heart: RRR, normal S1, S2, no murmurs Lungs: CTA bilaterally, no wheezes, rhonchi, or rales Abdomen: +bs, soft, non tender, non distended, no masses, no hepatomegaly, no splenomegaly Musculoskeletal: nontender, no swelling, no obvious deformity Extremities: no edema, no cyanosis, no clubbing Pulses: 2+ symmetric, upper and lower extremities, normal cap refill Neurological: alert, oriented x 3, CN2-12 intact, strength normal upper extremities and lower extremities, sensation normal throughout, DTRs 2+ throughout, no cerebellar signs, gait  antalgic Psychiatric: normal affect, behavior normal, pleasant  Breast: defer Gyn: defer Rectal: defer   Assessment:   1. Hypertension - CBC with Differential - BASIC METABOLIC PANEL WITH GFR - Hepatic function panel  2. Hypothyroidism - TSH  3. DJD Continue follow up with ortho  4. PreDiabetes Discussed general issues about diabetes pathophysiology and management., Educational material distributed., Suggested low cholesterol diet., Encouraged aerobic exercise., Discussed foot care., Reminded to get yearly retinal exam. - Hemoglobin A1c - HM DIABETES FOOT EXAM  5. Mixed hyperlipidemia - Lipid panel  6. Unspecified vitamin D deficiency  7. Encounter for long-term (current) use of other medications - Magnesium  8. Osteoporosis, unspecified - DG Bone Density; Future   Plan:   During the course of the visit the patient was educated and counseled about appropriate screening and preventive services including:    Pneumococcal vaccine   Influenza vaccine  Td vaccine  Screening electrocardiogram  Screening mammography  Bone densitometry screening  Diabetes screening  Glaucoma screening  Nutrition counseling   Screening recommendations, referrals:  Vaccinations: Tdap vaccine not indicated Influenza vaccine not indicated Pneumococcal vaccine not indicated Shingles vaccine declined Hep B vaccine not indicated  Nutrition assessed and recommended  Colonoscopy not indicated Mammogram requested Pap smear not indicated Pelvic exam not indicated Recommended yearly ophthalmology/optometry visit for glaucoma screening and checkup Recommended yearly dental visit for hygiene and checkup Advanced directives - requested  Conditions/risks identified: BMI: Discussed weight loss, diet, and increase physical activity.  Increase physical activity: AHA recommends 150 minutes of physical activity a week.  Medications reviewed DEXA- ordered Diabetes is not at goal,  ACE/ARB therapy: Yes. Urinary Incontinence is not an issue: discussed non pharmacology and pharmacology options.  Fall risk: moderate- discussed PT, home fall assessment, medications.   Medicare Attestation I have personally reviewed: The patient's medical and social history Their use of alcohol, tobacco or illicit drugs Their current medications and supplements The patient's functional ability including ADLs,fall risks, home safety risks, cognitive, and hearing and visual impairment Diet and physical activities Evidence for depression or mood disorders  The patient's weight, height, BMI, and visual acuity have been recorded in the chart.  I have made referrals, counseling, and provided education to the patient based on review of the above and I have provided the patient with a written personalized care plan for preventive services.     Quentin Mullingmanda Tran Randle, PA-C   03/08/2014

## 2014-03-08 NOTE — Patient Instructions (Signed)

## 2014-03-13 DIAGNOSIS — M25559 Pain in unspecified hip: Secondary | ICD-10-CM | POA: Diagnosis not present

## 2014-03-13 DIAGNOSIS — M76899 Other specified enthesopathies of unspecified lower limb, excluding foot: Secondary | ICD-10-CM | POA: Diagnosis not present

## 2014-03-30 ENCOUNTER — Other Ambulatory Visit: Payer: Self-pay

## 2014-03-30 DIAGNOSIS — Z1231 Encounter for screening mammogram for malignant neoplasm of breast: Secondary | ICD-10-CM

## 2014-04-11 ENCOUNTER — Ambulatory Visit (INDEPENDENT_AMBULATORY_CARE_PROVIDER_SITE_OTHER): Payer: Medicare Other

## 2014-04-11 DIAGNOSIS — E039 Hypothyroidism, unspecified: Secondary | ICD-10-CM

## 2014-04-11 NOTE — Progress Notes (Signed)
Patient ID: [name], female   DOB: 1933-08-24, 80 y.o.   MRN: [id number] Patient here today to recheck TSH. Verifies currently taking levothyroxine 100 mcg, takes 1/2 tablet on Tuesday and Thursday and one whole tablet all other days.

## 2014-04-12 LAB — TSH: TSH: 1.135 u[IU]/mL (ref 0.350–4.500)

## 2014-04-14 ENCOUNTER — Ambulatory Visit
Admission: RE | Admit: 2014-04-14 | Discharge: 2014-04-14 | Disposition: A | Discharge status: Home / Self Care | Payer: Medicare Other | Source: Ambulatory Visit

## 2014-04-14 DIAGNOSIS — Z1231 Encounter for screening mammogram for malignant neoplasm of breast: Secondary | ICD-10-CM

## 2014-04-18 DIAGNOSIS — M545 Low back pain, unspecified: Secondary | ICD-10-CM | POA: Diagnosis not present

## 2014-04-24 DIAGNOSIS — M48061 Spinal stenosis, lumbar region without neurogenic claudication: Secondary | ICD-10-CM | POA: Diagnosis not present

## 2014-05-15 ENCOUNTER — Other Ambulatory Visit: Payer: Self-pay | Admitting: Internal Medicine

## 2014-05-15 MED ORDER — TRAZODONE HCL 300 MG PO TABS: 300.0000 mg | ORAL_TABLET | Freq: Every day | ORAL | Status: DC

## 2014-05-16 ENCOUNTER — Other Ambulatory Visit: Payer: Self-pay | Admitting: Internal Medicine

## 2014-05-17 ENCOUNTER — Other Ambulatory Visit: Payer: Self-pay | Admitting: Internal Medicine

## 2014-05-25 DIAGNOSIS — M48061 Spinal stenosis, lumbar region without neurogenic claudication: Secondary | ICD-10-CM | POA: Diagnosis not present

## 2014-06-01 ENCOUNTER — Ambulatory Visit: Payer: Self-pay | Admitting: Internal Medicine

## 2014-06-14 ENCOUNTER — Encounter: Payer: Self-pay | Admitting: Internal Medicine

## 2014-06-14 ENCOUNTER — Ambulatory Visit (INDEPENDENT_AMBULATORY_CARE_PROVIDER_SITE_OTHER): Payer: Medicare Other | Admitting: Internal Medicine

## 2014-06-14 VITALS — BP 134/80 | HR 64 | Temp 98.6°F | Resp 16 | Ht 62.5 in | Wt 159.8 lb

## 2014-06-14 DIAGNOSIS — R7309 Other abnormal glucose: Secondary | ICD-10-CM | POA: Diagnosis not present

## 2014-06-14 DIAGNOSIS — Z79899 Other long term (current) drug therapy: Secondary | ICD-10-CM | POA: Insufficient documentation

## 2014-06-14 DIAGNOSIS — Z7901 Long term (current) use of anticoagulants: Secondary | ICD-10-CM | POA: Diagnosis not present

## 2014-06-14 DIAGNOSIS — E559 Vitamin D deficiency, unspecified: Secondary | ICD-10-CM | POA: Insufficient documentation

## 2014-06-14 DIAGNOSIS — E1129 Type 2 diabetes mellitus with other diabetic kidney complication: Secondary | ICD-10-CM

## 2014-06-14 DIAGNOSIS — E782 Mixed hyperlipidemia: Secondary | ICD-10-CM

## 2014-06-14 DIAGNOSIS — I10 Essential (primary) hypertension: Secondary | ICD-10-CM

## 2014-06-14 DIAGNOSIS — N183 Chronic kidney disease, stage 3 (moderate): Secondary | ICD-10-CM

## 2014-06-14 DIAGNOSIS — E1122 Type 2 diabetes mellitus with diabetic chronic kidney disease: Secondary | ICD-10-CM | POA: Insufficient documentation

## 2014-06-14 LAB — CBC WITH DIFFERENTIAL/PLATELET
BASOS PCT: 1 % (ref 0–1)
Basophils Absolute: 0.1 10*3/uL (ref 0.0–0.1)
EOS ABS: 0.3 10*3/uL (ref 0.0–0.7)
Eosinophils Relative: 4 % (ref 0–5)
HCT: 36 % (ref 36.0–46.0)
Hemoglobin: 12.1 g/dL (ref 12.0–15.0)
LYMPHS ABS: 2.9 10*3/uL (ref 0.7–4.0)
Lymphocytes Relative: 37 % (ref 12–46)
MCH: 30.5 pg (ref 26.0–34.0)
MCHC: 33.6 g/dL (ref 30.0–36.0)
MCV: 90.7 fL (ref 78.0–100.0)
MONO ABS: 0.6 10*3/uL (ref 0.1–1.0)
Monocytes Relative: 8 % (ref 3–12)
NEUTROS ABS: 3.9 10*3/uL (ref 1.7–7.7)
Neutrophils Relative %: 50 % (ref 43–77)
Platelets: 303 10*3/uL (ref 150–400)
RBC: 3.97 MIL/uL (ref 3.87–5.11)
RDW: 13.7 % (ref 11.5–15.5)
WBC: 7.8 10*3/uL (ref 4.0–10.5)

## 2014-06-14 NOTE — Progress Notes (Signed)
Patient ID: [name], female   DOB: 31-Dec-1932, 80 y.o.   MRN: [id number]   This very nice 80 y.o.WWF presents for 3 month follow up with Hypertension, Hyperlipidemia, Pre-Diabetes and Vitamin D Deficiency.  Patient presents today with c/o fatigability and sleepiness , altho she's having difficulty fallig & staying asleep at night . During review of meds today , it was discovered that she had mistakenly been taking her Trazodone 300mg  tabs qHs @ a dose of 3 tabs or 900 mg and her Celexa @ a dose of 40 mg bid.   Patient is treated for HTN (1987) & BP has been controlled at home. Today's BP: 134/80 mmHg. Patient denies any cardiac type chest pain, palpitations, dyspnea/orthopnea/PND, dizziness, claudication, or dependent edema.   Hyperlipidemia is controlled with diet & meds. Patient denies myalgias or other med SE's. Last Lipids were 03/08/2014: Cholesterol, Total 125; HDL Cholesterol by NMR 50; LDL (calc) 59; Triglycerides 81   Also, the patient has history of T2_NIDDMsince 2013 (A1c 5.7%) and Stage CKD (GFR 46 ml/min) with A1c 6.5% in 2008 and 7.1% in 2012  and patient attempts dietary control and denies any symptoms of reactive hypoglycemia, diabetic polys, paresthesias or visual blurring.  Last A1c was 03/08/2014: Hemoglobin-A1c 6.6*    Further, Patient has history of Vitamin D Deficiency (24 in 2009) and patient supplements vitamin D without any suspected side-effects. Last vitamin D was 12/02/2013: Vit D, 25-Hydroxy 54.   Medication List   aspirin 81 MG tablet  Take 81 mg by mouth daily.     atenolol 100 MG tablet  Commonly known as:  TENORMIN  Take 1 tablet (100 mg total) by mouth daily.     atorvastatin 80 MG tablet  Commonly known as:  LIPITOR  Take 80 mg by mouth daily.     citalopram 40 MG tablet  Commonly known as:  CELEXA  take 1 tablet by mouth twice a day for MOOD.     furosemide 40 MG tablet  Commonly known as:  LASIX  Take 1 tablet (40 mg total) by mouth daily.      levothyroxine 100 MCG tablet  Commonly known as:  SYNTHROID, LEVOTHROID  Take 1 tablet (100 mcg total) by mouth daily before breakfast.     LORazepam 2 MG/ML concentrated solution  Commonly known as:  LORAZEPAM INTENSOL  1/2 to 1 tablet at bedtime as needed for sleep     losartan 100 MG tablet  Commonly known as:  COZAAR  take 1 tablet by mouth once daily for blood pressure     mexiletine 150 MG capsule  Commonly known as:  MEXITIL  Take 150 mg by mouth 3 (three) times daily.     omeprazole 20 MG capsule  Commonly known as:  PRILOSEC     oxybutynin 5 MG tablet  Commonly known as:  DITROPAN  Take 1 tablet (5 mg total) by mouth 3 (three) times daily. For bladder control     traMADol 50 MG tablet  Commonly known as:  ULTRAM     trazodone 300 MG tablet  Commonly known as:  DESYREL  Take 1 tablet (300 mg total) by mouth at bedtime.     verapamil 240 MG (CO) 24 hr tablet  Commonly known as:  COVERA HS  Take 240 mg by mouth daily.       Allergies  Allergen Reactions  . Augmentin [Amoxicillin-Pot Clavulanate]     vaginitis  . Codeine     rash  .  Hydrocodone     rash  . Hytrin [Terazosin]     incontinence   . Keflex [Cephalexin]     nausea  . Oxycodone     rash  . Procardia [Nifedipine]     edema  . Vasotec [Enalapril]     cough   PMHx:   Past Medical History  Diagnosis Date  . Hypertension   . Hyperlipidemia   . Anxiety   . Depression   . Thyroid disease   . Anemia   . Type II or unspecified type diabetes mellitus with renal manifestations, not stated as uncontrolled     stage II  . History of toe surgery left  . GERD (gastroesophageal reflux disease)    FHx:    Reviewed / unchanged SHx:    Reviewed / unchanged  Systems Review:  Constitutional: Denies fever, chills, wt changes, headaches, insomnia, fatigue, night sweats, change in appetite. Eyes: Denies redness, blurred vision, diplopia, discharge, itchy, watery eyes.  ENT: Denies discharge,  congestion, post nasal drip, epistaxis, sore throat, earache, hearing loss, dental pain, tinnitus, vertigo, sinus pain, snoring.  CV: Denies chest pain, palpitations, irregular heartbeat, syncope, dyspnea, diaphoresis, orthopnea, PND, claudication or edema. Respiratory: denies cough, dyspnea, DOE, pleurisy, hoarseness, laryngitis, wheezing.  Gastrointestinal: Denies dysphagia, odynophagia, heartburn, reflux, water brash, abdominal pain or cramps, nausea, vomiting, bloating, diarrhea, constipation, hematemesis, melena, hematochezia  or hemorrhoids. Genitourinary: Denies dysuria, frequency, urgency, nocturia, hesitancy, discharge, hematuria or flank pain. Musculoskeletal: Denies arthralgias, myalgias, stiffness, jt. swelling, pain, limping or strain/sprain.  Skin: Denies pruritus, rash, hives, warts, acne, eczema or change in skin lesion(s). Neuro: No weakness, tremor, incoordination, spasms, paresthesia or pain. Psychiatric: Denies confusion, memory loss or sensory loss. Endo: Denies change in weight, skin or hair change.  Heme/Lymph: No excessive bleeding, bruising or enlarged lymph nodes.  Exam:  BP 134/80  Pulse 64  Temp(Src) 98.6 F (37 C) (Temporal)  Resp 16  Ht 5' 2.5" (1.588 m)  Wt 159 lb 12.8 oz (72.485 kg)  BMI 28.74 kg/m2  Appears well nourished and in no distress. Eyes: PERRLA, EOMs, conjunctiva no swelling or erythema. Sinuses: No frontal/maxillary tenderness ENT/Mouth: EAC's clear, TM's nl w/o erythema, bulging. Nares clear w/o erythema, swelling, exudates. Oropharynx clear without erythema or exudates. Oral hygiene is good. Tongue normal, non obstructing. Hearing intact.  Neck: Supple. Thyroid nl. Car 2+/2+ without bruits, nodes or JVD. Chest: Respirations nl with BS clear & equal w/o rales, rhonchi, wheezing or stridor.  Cor: Heart sounds normal w/ regular rate and rhythm without sig. murmurs, gallops, clicks, or rubs. Peripheral pulses normal and equal  without edema.   Abdomen: Soft & bowel sounds normal. Non-tender w/o guarding, rebound, hernias, masses, or organomegaly.  Lymphatics: Unremarkable.  Musculoskeletal: Full ROM all peripheral extremities, joint stability, 5/5 strength, and normal gait.  Skin: Warm, dry without exposed rashes, lesions or ecchymosis apparent.  Neuro: Cranial nerves intact, reflexes equal bilaterally. Sensory-motor testing grossly intact. Tendon reflexes grossly intact.  Pysch: Alert & oriented x 3. Insight and judgement nl & appropriate. No ideations.  Assessment and Plan:  1. Hypertension - Continue monitor blood pressure at home. Continue diet/meds same.  2. Hyperlipidemia - Continue diet/meds, exercise,& lifestyle modifications. Continue monitor periodic cholesterol/liver & renal functions   3. T2_NIDDM w/ Stage 3 CKD - Continue diet, exercise, lifestyle modifications. Monitor appropriate labs.  4. Vitamin D Deficiency - Continue supplementation.  5. Hypothyroidism  6. GERD  7. Lethargy - Patient was advised to withhold both Trazodone & citalopram  x 1 week , then resume the former at 300 mg qHs and the latter at 1/2 tab = 20 mg Qd.  Recommended regular exercise, BP monitoring, weight control, and discussed med and SE's. Recommended labs to assess and monitor clinical status. Further disposition pending results of labs.

## 2014-06-14 NOTE — Patient Instructions (Addendum)
Stop your Trazodone 300 mg for 1 week , then  Restart only 1 pill at bedtime for sleep  (only if needed)  +++++++++++++++++++++++++++++++++++++  Stop your Citalopram 40 mg for 1 week, then  Restart only 1/2 tablet (=20 mg) each morning  +++++++++++++++++++++++++++++++++++++  Recommend the book "The END of DIETING" by Dr Nyra MarketJoel Furman   and the book "The END of DIABETES " by Dr Monico HoarJoel Fuhrman  At [email or web address] - get book & Audio CD's      Being diabetic has a  300% increased risk for heart attack, stroke, cancer, and alzheimer- type vascular dementia. It is very important that you work harder with diet by avoiding all foods that are white except chicken & fish. Avoid white rice (brown & wild rice is OK), white potatoes (sweetpotatoes in moderation is OK), White bread or wheat bread or anything made out of white flour like bagels, donuts, rolls, buns, biscuits, cakes, pastries, cookies, pizza crust, and pasta (made from white flour & egg whites) - vegetarian pasta or spinach or wheat pasta is OK. Multigrain breads like Arnold's or [employer], or multigrain sandwich thins or flatbreads.  Diet, exercise and weight loss can reverse and cure diabetes in the early stages.  Diet, exercise and weight loss is very important in the control and prevention of complications of diabetes which affects every system in your body, ie. Brain - dementia/stroke, eyes - glaucoma/blindness, heart - heart attack/heart failure, kidneys - dialysis, stomach - gastric paralysis, intestines - malabsorption, nerves - severe painful neuritis, circulation - gangrene & loss of a leg(s), and finally cancer and Alzheimers.    I recommend avoid fried & greasy foods,  sweets/candy, white rice (brown or wild rice or Quinoa is OK), white potatoes (sweet potatoes are OK) - anything made from white flour - bagels, doughnuts, rolls, buns, biscuits,white and wheat breads, pizza crust and traditional pasta made of white flour & egg  white(vegetarian pasta or spinach or wheat pasta is OK).  Multi-grain bread is OK - like multi-grain flat bread or sandwich thins. Avoid alcohol in excess. Exercise is also important.    Eat all the vegetables you want - avoid meat, especially red meat and dairy - especially cheese.  Cheese is the most concentrated form of trans-fats which is the worst thing to clog up our arteries. Veggie cheese is OK which can be found in the fresh produce section at [facility] or [employer] or [employer]

## 2014-06-15 ENCOUNTER — Other Ambulatory Visit: Payer: Self-pay | Admitting: *Deleted

## 2014-06-15 ENCOUNTER — Telehealth: Payer: Self-pay | Admitting: *Deleted

## 2014-06-15 LAB — BASIC METABOLIC PANEL WITH GFR
BUN: 16 mg/dL (ref 6–23)
CO2: 35 mEq/L — ABNORMAL HIGH (ref 19–32)
Calcium: 10 mg/dL (ref 8.4–10.5)
Chloride: 99 mEq/L (ref 96–112)
Creat: 1.04 mg/dL (ref 0.50–1.10)
GFR, EST AFRICAN AMERICAN: 59 mL/min — AB
GFR, Est Non African American: 51 mL/min — ABNORMAL LOW
GLUCOSE: 111 mg/dL — AB (ref 70–99)
POTASSIUM: 3.7 meq/L (ref 3.5–5.3)
Sodium: 141 mEq/L (ref 135–145)

## 2014-06-15 LAB — INSULIN, FASTING: INSULIN FASTING, SERUM: 14 u[IU]/mL (ref 3–28)

## 2014-06-15 LAB — HEPATIC FUNCTION PANEL
ALK PHOS: 64 U/L (ref 39–117)
ALT: 15 U/L (ref 0–35)
AST: 21 U/L (ref 0–37)
Albumin: 4.5 g/dL (ref 3.5–5.2)
Bilirubin, Direct: 0.1 mg/dL (ref 0.0–0.3)
Indirect Bilirubin: 0.3 mg/dL (ref 0.2–1.2)
TOTAL PROTEIN: 7 g/dL (ref 6.0–8.3)
Total Bilirubin: 0.4 mg/dL (ref 0.2–1.2)

## 2014-06-15 LAB — HEMOGLOBIN A1C
Hgb A1c MFr Bld: 6.7 % — ABNORMAL HIGH (ref <5.7)
MEAN PLASMA GLUCOSE: 146 mg/dL — AB (ref <117)

## 2014-06-15 LAB — LIPID PANEL
Cholesterol: 170 mg/dL (ref 0–200)
HDL: 56 mg/dL (ref >39)
LDL Cholesterol: 88 mg/dL (ref 0–99)
TRIGLYCERIDES: 130 mg/dL (ref <150)
Total CHOL/HDL Ratio: 3 Ratio
VLDL: 26 mg/dL (ref 0–40)

## 2014-06-15 LAB — MAGNESIUM: Magnesium: 1.9 mg/dL (ref 1.5–2.5)

## 2014-06-15 LAB — VITAMIN D 25 HYDROXY (VIT D DEFICIENCY, FRACTURES): Vit D, 25-Hydroxy: 47 ng/mL (ref 30–89)

## 2014-06-15 LAB — TSH: TSH: 0.455 u[IU]/mL (ref 0.350–4.500)

## 2014-06-15 MED ORDER — TRAZODONE HCL 100 MG PO TABS: 100.0000 mg | ORAL_TABLET | Freq: Every day | ORAL | Status: DC

## 2014-06-15 NOTE — Telephone Encounter (Signed)
Spoke with patient regarding Trazodone dose.  Patient has 100 mg tabs and Dr Oneta RackMcKeown advised she try 1 1/2 tabs at bedtime for sleep.  Patient aware.

## 2014-06-26 DIAGNOSIS — M48061 Spinal stenosis, lumbar region without neurogenic claudication: Secondary | ICD-10-CM | POA: Diagnosis not present

## 2014-07-10 ENCOUNTER — Other Ambulatory Visit: Payer: Self-pay | Admitting: *Deleted

## 2014-07-10 MED ORDER — VERAPAMIL HCL 240 MG (CO) PO TB24: 240.0000 mg | ORAL_TABLET | Freq: Every day | ORAL | Status: DC

## 2014-07-10 MED ORDER — MEXILETINE HCL 150 MG PO CAPS: 150.0000 mg | ORAL_CAPSULE | Freq: Three times a day (TID) | ORAL | Status: DC

## 2014-07-11 ENCOUNTER — Ambulatory Visit: Payer: Self-pay | Admitting: Internal Medicine

## 2014-07-11 ENCOUNTER — Telehealth: Payer: Self-pay | Admitting: *Deleted

## 2014-07-11 MED ORDER — ALPRAZOLAM 1 MG PO TABS: ORAL_TABLET | ORAL | Status: DC

## 2014-07-11 NOTE — Telephone Encounter (Signed)
Patient called and states Trazadone not helping her to sleep.  Patient  states she had an old RX for Xanax and took 1 tablet.  She slept very well and is requesting an RX for Xanax.. OK to call RX for Alprazolam per Dr Oneta Rack.

## 2014-08-16 ENCOUNTER — Other Ambulatory Visit: Payer: Self-pay | Admitting: Physician Assistant

## 2014-08-24 ENCOUNTER — Emergency Department (HOSPITAL_COMMUNITY)
Admission: EM | Admit: 2014-08-24 | Discharge: 2014-08-24 | Disposition: A | Discharge status: Home / Self Care | Payer: Medicare Other | Attending: Emergency Medicine | Admitting: Emergency Medicine

## 2014-08-24 ENCOUNTER — Emergency Department (HOSPITAL_COMMUNITY): Payer: Medicare Other

## 2014-08-24 DIAGNOSIS — Z79899 Other long term (current) drug therapy: Secondary | ICD-10-CM | POA: Diagnosis not present

## 2014-08-24 DIAGNOSIS — F419 Anxiety disorder, unspecified: Secondary | ICD-10-CM | POA: Diagnosis not present

## 2014-08-24 DIAGNOSIS — Y9289 Other specified places as the place of occurrence of the external cause: Secondary | ICD-10-CM | POA: Insufficient documentation

## 2014-08-24 DIAGNOSIS — Z23 Encounter for immunization: Secondary | ICD-10-CM | POA: Insufficient documentation

## 2014-08-24 DIAGNOSIS — S81812A Laceration without foreign body, left lower leg, initial encounter: Secondary | ICD-10-CM | POA: Diagnosis not present

## 2014-08-24 DIAGNOSIS — Y9301 Activity, walking, marching and hiking: Secondary | ICD-10-CM | POA: Diagnosis not present

## 2014-08-24 DIAGNOSIS — I10 Essential (primary) hypertension: Secondary | ICD-10-CM | POA: Diagnosis not present

## 2014-08-24 DIAGNOSIS — Z7982 Long term (current) use of aspirin: Secondary | ICD-10-CM | POA: Insufficient documentation

## 2014-08-24 DIAGNOSIS — IMO0002 Reserved for concepts with insufficient information to code with codable children: Secondary | ICD-10-CM

## 2014-08-24 DIAGNOSIS — F329 Major depressive disorder, single episode, unspecified: Secondary | ICD-10-CM | POA: Insufficient documentation

## 2014-08-24 DIAGNOSIS — Z862 Personal history of diseases of the blood and blood-forming organs and certain disorders involving the immune mechanism: Secondary | ICD-10-CM | POA: Diagnosis not present

## 2014-08-24 DIAGNOSIS — Z9889 Other specified postprocedural states: Secondary | ICD-10-CM | POA: Diagnosis not present

## 2014-08-24 DIAGNOSIS — Z8719 Personal history of other diseases of the digestive system: Secondary | ICD-10-CM | POA: Diagnosis not present

## 2014-08-24 DIAGNOSIS — E079 Disorder of thyroid, unspecified: Secondary | ICD-10-CM | POA: Insufficient documentation

## 2014-08-24 DIAGNOSIS — X58XXXA Exposure to other specified factors, initial encounter: Secondary | ICD-10-CM | POA: Insufficient documentation

## 2014-08-24 DIAGNOSIS — E1129 Type 2 diabetes mellitus with other diabetic kidney complication: Secondary | ICD-10-CM | POA: Insufficient documentation

## 2014-08-24 DIAGNOSIS — E785 Hyperlipidemia, unspecified: Secondary | ICD-10-CM | POA: Diagnosis not present

## 2014-08-24 MED ORDER — LIDOCAINE-EPINEPHRINE (PF) 2 %-1:200000 IJ SOLN
20.0000 mL | Freq: Once | INTRAMUSCULAR | Status: AC
  Administered 2014-08-24: 20 mL
  Filled 2014-08-24: qty 10

## 2014-08-24 MED ORDER — DOXYCYCLINE HYCLATE 100 MG PO CAPS: 100.0000 mg | ORAL_CAPSULE | Freq: Two times a day (BID) | ORAL | Status: DC

## 2014-08-24 MED ORDER — TETANUS-DIPHTH-ACELL PERTUSSIS 5-2.5-18.5 LF-MCG/0.5 IM SUSP
0.5000 mL | Freq: Once | INTRAMUSCULAR | Status: AC
  Administered 2014-08-24: 0.5 mL via INTRAMUSCULAR
  Filled 2014-08-24: qty 0.5

## 2014-08-24 NOTE — ED Notes (Signed)
Pollina, MD at bedside 

## 2014-08-24 NOTE — ED Notes (Signed)
Pt transported to radiology via stretcher with tech.  

## 2014-08-24 NOTE — ED Provider Notes (Signed)
Patient presented to the ER with laceration to lower leg.  Face to face Exam: HEENT - PERRLA Lungs - CTAB Heart - RRR, no M/R/G Abd - S/NT/ND Neuro - alert, oriented x3 musculoskeletal - large flap laceration anterior left lower leg  Plan: Irrigation, sutures, antibiotic coverage.   Gilda Creasehristopher J. Nyanna Heideman, MD 08/24/14 [phone]

## 2014-08-24 NOTE — ED Notes (Signed)
Pt states she was climbing up a wood step that was rotten and broke cutting her left lower extremity. Pt arrived with intact dressing.

## 2014-08-24 NOTE — Discharge Instructions (Signed)
Keep wound dry and do not remove dressing for 24 hours if possible. After that, wash gently morning and night (every 12 hours) with soap and water. Use a topical antibiotic ointment and cover with a bandaid or gauze.    Please take all of your antibiotics until finished!   You may develop abdominal discomfort or diarrhea from the antibiotic.  You may help offset this with probiotics which you can buy or get in yogurt. Do not eat  or take the probiotics until 2 hours after your antibiotic.   Do NOT use rubbing alcohol or hydrogen peroxide, do not soak the area   Present to your primary care doctor or the urgent care of your choice, or the ED for suture removal in 7-10 days.   Every attempt was made to remove foreign body (contaminants) from the wound.  However, there is always a chance that some may remain in the wound. This can  increase your risk of infection.   If you see signs of infection (warmth, redness, tenderness, pus, sharp increase in pain, fever, red streaking in the skin) immediately return to the emergency department.   After the wound heals fully, apply sunscreen for 6-12 months to minimize scarring.

## 2014-08-24 NOTE — ED Provider Notes (Signed)
CSN: [id number]     Arrival date & time 08/24/14  1119 History   First MD Initiated Contact with Patient 08/24/14 1127     Chief Complaint  Patient presents with  . Extremity Laceration     (Consider location/radiation/quality/duration/timing/severity/associated sxs/prior Treatment) HPI [name] is a 80 y.o. female with PMH of HTN, Hyperlipidemia, cerebral aneurysm repair presenting with laceration to left lower anterior leg around 10:30am. She was walking on wooden steps and the step gave way and cut her leaving a skin flap to left dorsal leg. Bleeding well controlled. Patient drove here. Patient not on anticoagulants other than daily aspirin. Patient does not remember the last time she had her tetanus updated. Patient reports borderline hyperglycemia but not on medications.   Past Medical History  Diagnosis Date  . Hypertension   . Hyperlipidemia   . Anxiety   . Depression   . Thyroid disease   . Anemia   . Type II or unspecified type diabetes mellitus with renal manifestations, not stated as uncontrolled     stage II  . History of toe surgery left  . GERD (gastroesophageal reflux disease)    Past Surgical History  Procedure Laterality Date  . Knee surgery    . Abdominal hysterectomy    . Tonsillectomy    . Appendectomy    . Cholecystectomy    . Cerebral aneurysm repair  09/2007    coil ablation  . Joint replacement      right TKR X 2, Left TKR X1   Family History  Problem Relation Age of Onset  . Cancer Mother     breast cancer  . Hypertension Mother   . Mental illness Mother     alz  . Cancer Father     colon cancer  . Hypertension Father   . Cancer Maternal Aunt     breast cancer with mets   History  Substance Use Topics  . Smoking status: Never Smoker   . Smokeless tobacco: Never Used  . Alcohol Use: No   OB History   Grav Para Term Preterm Abortions TAB SAB Ect Mult Living                 Review of Systems  Constitutional: Negative for  fever and chills.  Gastrointestinal: Negative for nausea and vomiting.  Skin: Positive for wound.      Allergies  Augmentin; Codeine; Hydrocodone; Hytrin; Keflex; Oxycodone; Procardia; and Vasotec  Home Medications   Prior to Admission medications   Medication Sig Start Date End Date Taking? Authorizing Provider  aspirin 81 MG tablet Take 81 mg by mouth daily.   Yes Historical Provider, MD  atenolol (TENORMIN) 100 MG tablet Take 1 tablet by mouth  daily 08/16/14  Yes Lucky CowboyWilliam McKeown, MD  atorvastatin (LIPITOR) 80 MG tablet Take 80 mg by mouth daily.   Yes Historical Provider, MD  citalopram (CELEXA) 40 MG tablet Take 40 mg by mouth daily.   Yes Historical Provider, MD  furosemide (LASIX) 40 MG tablet Take 1 tablet (40 mg total) by mouth daily. 09/20/13  Yes Quentin MullingAmanda Collier, PA-C  ibuprofen (ADVIL,MOTRIN) 100 MG tablet Take 200 mg by mouth daily as needed for pain or fever (hip pain).   Yes Historical Provider, MD  levothyroxine (SYNTHROID, LEVOTHROID) 100 MCG tablet Take 1 tablet (100 mcg total) by mouth daily before breakfast. 09/20/13  Yes Quentin MullingAmanda Collier, PA-C  losartan (COZAAR) 50 MG tablet Take 50 mg by mouth daily.   Yes Historical  Provider, MD  mexiletine (MEXITIL) 150 MG capsule Take 150 mg by mouth daily.   Yes Historical Provider, MD  trazodone (DESYREL) 300 MG tablet Take 300 mg by mouth at bedtime.   Yes Historical Provider, MD  verapamil (COVERA HS) 240 MG (CO) 24 hr tablet Take 1 tablet (240 mg total) by mouth daily. 07/10/14  Yes Lucky CowboyWilliam McKeown, MD  doxycycline (VIBRAMYCIN) 100 MG capsule Take 1 capsule (100 mg total) by mouth 2 (two) times daily. 08/24/14   Benetta SparVictoria L Valori Hollenkamp, PA-C   BP 158/81  Pulse 67  Temp(Src) 98.5 F (36.9 C) (Oral)  Resp 16  SpO2 95% Physical Exam  Nursing note and vitals reviewed. Constitutional: She appears well-developed and well-nourished. No distress.  HENT:  Head: Normocephalic and atraumatic.  Eyes: Conjunctivae are normal. Right eye  exhibits no discharge. Left eye exhibits no discharge.  Pulmonary/Chest: Effort normal. No respiratory distress.  Neurological: She is alert. Coordination normal.  Skin: She is not diaphoretic.  10cm laceration to left anterior lower leg with superficial skin flap. Bleeding intact. Patient moves all toes. Strength, sensation and pedal pulses intact and equal bilaterally    ED Course  Procedures (including critical care time) Labs Review Labs Reviewed - No data to display  Imaging Review Dg Tibia/fibula Left  08/24/2014   CLINICAL DATA:  Status post fall, laceration to the anterior aspect of the leg  EXAM: LEFT TIBIA AND FIBULA - 2 VIEW  COMPARISON:  None.  FINDINGS: There is no evidence of fracture or other focal bone lesions. Left total knee arthroplasty. There is peripheral vascular atherosclerotic disease. Soft tissues are unremarkable.  IMPRESSION: No acute osseous injury of the left tibia or fibula.   Electronically Signed   By: Elige KoHetal  Patel   On: 08/24/2014 13:18     EKG Interpretation None     LACERATION REPAIR Performed by: Louann SjogrenVictoria L Jakaree Pickard Authorized by: Louann SjogrenVictoria L Willo Yoon Consent: Verbal consent obtained. Risks and benefits: risks, benefits and alternatives were discussed Consent given by: patient Patient identity confirmed: provided demographic data Prepped and Draped in normal sterile fashion Wound explored  Laceration Location: left anterior lower leg.  Laceration Length: 10cmcm  No Foreign Bodies seen or palpated  Anesthesia: local infiltration  Local anesthetic: lidocaine 2% with epinephrine  Anesthetic total: 10 ml  Irrigation method: syringe Amount of cleaning: standard  Skin closure: 3-0 proline  Number of sutures: 7  Technique: 5 simple interrupted and 2 vertical mattress  Patient tolerance: Patient tolerated the procedure well with no immediate complications.  Meds given in ED:  Medications  Tdap (BOOSTRIX) injection 0.5 mL (0.5 mLs  Intramuscular Given 08/24/14 1257)  lidocaine-EPINEPHrine (XYLOCAINE W/EPI) 2 %-1:200000 (PF) injection 20 mL (20 mLs Infiltration Given 08/24/14 1300)    Discharge Medication List as of 08/24/2014  2:46 PM    START taking these medications   Details  doxycycline (VIBRAMYCIN) 100 MG capsule Take 1 capsule (100 mg total) by mouth 2 (two) times daily., Starting 08/24/2014, Until Discontinued, Print           MDM   Final diagnoses:  Laceration  Laceration of left leg, initial encounter   Tdap booster given. Laceration occurred < 8 hours prior to repair which was well tolerated. VSS. Neurovascularly intact. Xray without signs of foreign body. Laceration anesthetized, irrigated and repaired without immediate complications. Bacitracin and sterile dressing applied. Pt has borderline DM to influence normal wound healing and patient with skin flap. Will give prophylactic abx. Discussed suture home care  w pt and answered questions. Pt to f-u for wound check and suture removal in 7-10 days. Pt is hemodynamically stable w no complaints prior to dc.    Discussed return precautions with patient. Discussed all results and patient verbalizes understanding and agrees with plan.  This is a shared patient. This patient was discussed with the physician who saw and evaluated the patient and agrees with the plan.     Louann Sjogren, PA-C 08/24/14 1533  Louann Sjogren, [place] 08/24/14 1534

## 2014-08-25 NOTE — ED Provider Notes (Signed)
Medical screening examination/treatment/procedure(s) were conducted as a shared visit with non-physician practitioner(s) and myself.  I personally evaluated the patient during the encounter.  Please see separate associated note for evaluation and plan.    EKG Interpretation None       Gilda Creasehristopher J. Pollina, MD 08/25/14 1505

## 2014-09-05 ENCOUNTER — Ambulatory Visit (INDEPENDENT_AMBULATORY_CARE_PROVIDER_SITE_OTHER): Payer: Medicare Other | Admitting: Internal Medicine

## 2014-09-05 ENCOUNTER — Encounter: Payer: Self-pay | Admitting: Internal Medicine

## 2014-09-05 VITALS — BP 130/82 | HR 64 | Temp 98.1°F | Resp 16 | Ht 62.5 in | Wt 157.8 lb

## 2014-09-05 DIAGNOSIS — Z23 Encounter for immunization: Secondary | ICD-10-CM

## 2014-09-05 DIAGNOSIS — S81812D Laceration without foreign body, left lower leg, subsequent encounter: Secondary | ICD-10-CM

## 2014-09-05 MED ORDER — DOXYCYCLINE HYCLATE 100 MG PO CAPS: 100.0000 mg | ORAL_CAPSULE | Freq: Two times a day (BID) | ORAL | Status: DC

## 2014-09-05 NOTE — Patient Instructions (Signed)
  Clean Daily with Soap & Water   and   Rinse with Hydrogen Peroxide -   Pat Dry and apply Antibiotic Ointment   and   cover with a Non- Stick Gauze

## 2014-09-05 NOTE — Progress Notes (Signed)
   Subjective:    Patient ID: [name], female    DOB: 03-16-1933, 80 y.o.   MRN: [id number]  HPI  On Oct 15 patient had repair of a  10 cm skin flap laceration to the left ant shin at the ER and presents today for evaluation & suture removal. Meds - All - PMH - reviewed  Review of Systems Non contributory.   Objective:   Physical Exam  BP 130/82  Pulse 64  Temp(Src) 98.1 F (36.7 C) (Temporal)  Resp 16  Ht 5' 2.5" (1.588 m)  Wt 157 lb 12.8 oz (71.578 kg)  BMI 28.38 kg/m2  Focused exam finds a 10 cm vertical curvilinear  wound of the left anterior shin appearing to have an near approximated but incompetent healing ridge aligned  by widely spaced vertical  mattress sutures with proline. No sign of cellulitis or purulence.  Assessment & Plan:   1. Laceration of leg excluding thigh, left, subsequent encounter  - healing ridge not fully matured for suture removal  2. Need for prophylactic vaccination and inoculation against influenza  - Flu vaccine HIGH DOSE PF (Fluzone Tri High dose)  Rx - Doxycycline 100 mg #60 - 1 cap bid pc   ROV 1 week to reassess

## 2014-09-11 ENCOUNTER — Encounter: Payer: Self-pay | Admitting: Internal Medicine

## 2014-09-11 ENCOUNTER — Ambulatory Visit (INDEPENDENT_AMBULATORY_CARE_PROVIDER_SITE_OTHER): Payer: Medicare Other | Admitting: Internal Medicine

## 2014-09-11 VITALS — BP 124/66 | HR 68 | Temp 97.5°F | Resp 16 | Ht 62.5 in | Wt 157.0 lb

## 2014-09-11 DIAGNOSIS — S81812D Laceration without foreign body, left lower leg, subsequent encounter: Secondary | ICD-10-CM

## 2014-09-11 NOTE — Progress Notes (Signed)
   Subjective:    Patient ID: [name], female    DOB: 1933-06-23, 80 y.o.   MRN: [id number]  HPI Pt returns for 1 week f/u for suture removal of [facility]eration [facility]. No fever chills , wound drainage related.  Meds/All/PMH - reviewed  Review of Systems Neg x 10    Objective:   Physical Exam   BP 124/66 mmHg  Pulse 68  Temp 97.5 F   Resp 16  Ht 5' 2.5"   Wt 157 lb   BMI 28.24    Focused exam on Left shin - healing ridge now appears competent & # 6 proline vertical mattress sutures removed. No signs of infection. There is an area of denuded skin necrosis approx. 3 mm x 50 mm along the proximal flap, bur healing ridge appears competent.    Wound was rinsed with H2O2, painted with Betadine and Antibiotic ointment applied. 2.5" x 4" non stick pad applied and secured with a 2" ace wrap.  Assessment & Plan:   1) Laceration Left Leg, healing

## 2014-09-12 ENCOUNTER — Ambulatory Visit: Payer: Self-pay | Admitting: Internal Medicine

## 2014-09-21 ENCOUNTER — Ambulatory Visit (INDEPENDENT_AMBULATORY_CARE_PROVIDER_SITE_OTHER): Payer: Medicare Other | Admitting: Physician Assistant

## 2014-09-21 ENCOUNTER — Encounter: Payer: Self-pay | Admitting: Physician Assistant

## 2014-09-21 VITALS — BP 142/78 | HR 64 | Temp 98.6°F | Resp 16 | Ht 62.5 in | Wt 161.0 lb

## 2014-09-21 DIAGNOSIS — E1129 Type 2 diabetes mellitus with other diabetic kidney complication: Secondary | ICD-10-CM | POA: Diagnosis not present

## 2014-09-21 DIAGNOSIS — E782 Mixed hyperlipidemia: Secondary | ICD-10-CM

## 2014-09-21 DIAGNOSIS — E559 Vitamin D deficiency, unspecified: Secondary | ICD-10-CM

## 2014-09-21 DIAGNOSIS — E039 Hypothyroidism, unspecified: Secondary | ICD-10-CM | POA: Diagnosis not present

## 2014-09-21 DIAGNOSIS — Z79899 Other long term (current) drug therapy: Secondary | ICD-10-CM | POA: Diagnosis not present

## 2014-09-21 DIAGNOSIS — E1122 Type 2 diabetes mellitus with diabetic chronic kidney disease: Secondary | ICD-10-CM | POA: Diagnosis not present

## 2014-09-21 DIAGNOSIS — I10 Essential (primary) hypertension: Secondary | ICD-10-CM

## 2014-09-21 DIAGNOSIS — N183 Chronic kidney disease, stage 3 (moderate): Secondary | ICD-10-CM

## 2014-09-21 LAB — CBC WITH DIFFERENTIAL/PLATELET
BASOS PCT: 1 % (ref 0–1)
Basophils Absolute: 0.1 10*3/uL (ref 0.0–0.1)
Eosinophils Absolute: 0.3 10*3/uL (ref 0.0–0.7)
Eosinophils Relative: 3 % (ref 0–5)
HCT: 33.8 % — ABNORMAL LOW (ref 36.0–46.0)
Hemoglobin: 11.1 g/dL — ABNORMAL LOW (ref 12.0–15.0)
LYMPHS ABS: 2.5 10*3/uL (ref 0.7–4.0)
LYMPHS PCT: 29 % (ref 12–46)
MCH: 30.5 pg (ref 26.0–34.0)
MCHC: 32.8 g/dL (ref 30.0–36.0)
MCV: 92.9 fL (ref 78.0–100.0)
MONO ABS: 0.6 10*3/uL (ref 0.1–1.0)
MONOS PCT: 7 % (ref 3–12)
NEUTROS ABS: 5.2 10*3/uL (ref 1.7–7.7)
Neutrophils Relative %: 60 % (ref 43–77)
Platelets: 334 10*3/uL (ref 150–400)
RBC: 3.64 MIL/uL — ABNORMAL LOW (ref 3.87–5.11)
RDW: 13.8 % (ref 11.5–15.5)
WBC: 8.6 10*3/uL (ref 4.0–10.5)

## 2014-09-21 LAB — LIPID PANEL
Cholesterol: 121 mg/dL (ref 0–200)
HDL: 58 mg/dL (ref >39)
LDL CALC: 47 mg/dL (ref 0–99)
Total CHOL/HDL Ratio: 2.1 Ratio
Triglycerides: 81 mg/dL (ref <150)
VLDL: 16 mg/dL (ref 0–40)

## 2014-09-21 LAB — BASIC METABOLIC PANEL WITH GFR
BUN: 19 mg/dL (ref 6–23)
CHLORIDE: 103 meq/L (ref 96–112)
CO2: 32 mEq/L (ref 19–32)
Calcium: 9.3 mg/dL (ref 8.4–10.5)
Creat: 1.04 mg/dL (ref 0.50–1.10)
GFR, Est African American: 59 mL/min — ABNORMAL LOW
GFR, Est Non African American: 51 mL/min — ABNORMAL LOW
GLUCOSE: 89 mg/dL (ref 70–99)
POTASSIUM: 4.1 meq/L (ref 3.5–5.3)
Sodium: 141 mEq/L (ref 135–145)

## 2014-09-21 LAB — HEPATIC FUNCTION PANEL
ALK PHOS: 65 U/L (ref 39–117)
ALT: 16 U/L (ref 0–35)
AST: 24 U/L (ref 0–37)
Albumin: 3.8 g/dL (ref 3.5–5.2)
BILIRUBIN INDIRECT: 0.4 mg/dL (ref 0.2–1.2)
Bilirubin, Direct: 0.1 mg/dL (ref 0.0–0.3)
TOTAL PROTEIN: 6.2 g/dL (ref 6.0–8.3)
Total Bilirubin: 0.5 mg/dL (ref 0.2–1.2)

## 2014-09-21 LAB — MAGNESIUM: MAGNESIUM: 2.2 mg/dL (ref 1.5–2.5)

## 2014-09-21 NOTE — Patient Instructions (Addendum)
BP is elevated, if your BP is higher than 150/90 this is TOO high. Will increase cozaar to 100mg  daily or 1 whole pill, if you BP gets below 130/70 then go back to 1/2 of the cozaar daily.   Citalopram take only ONE a day, can take 1/2 a pill a day too if you prefer. Never stop abruptly.      Bad carbs also include fruit juice, alcohol, and sweet tea. These are empty calories that do not signal to your brain that you are full.   Please remember the good carbs are still carbs which convert into sugar. So please measure them out no more than 1/2-1 cup of rice, oatmeal, pasta, and beans  Veggies are however free foods! Pile them on.   Not all fruit is created equal. Please see the list below, the fruit at the bottom is higher in sugars than the fruit at the top. Please avoid all dried fruits.      DASH Eating Plan DASH stands for "Dietary Approaches to Stop Hypertension." The DASH eating plan is a healthy eating plan that has been shown to reduce high blood pressure (hypertension). Additional health benefits may include reducing the risk of type 2 diabetes mellitus, heart disease, and stroke. The DASH eating plan may also help with weight loss. WHAT DO I NEED TO KNOW ABOUT THE DASH EATING PLAN? For the DASH eating plan, you will follow these general guidelines: 5. Choose foods with a percent daily value for sodium of less than 5% (as listed on the food label). 6. Use salt-free seasonings or herbs instead of table salt or sea salt. 7. Check with your health care provider or pharmacist before using salt substitutes. 8. Eat lower-sodium products, often labeled as "lower sodium" or "no salt added." 9. Eat fresh foods. 10. Eat more vegetables, fruits, and low-fat dairy products. 11. Choose whole grains. Look for the word "whole" as the first word in the ingredient list. 12. Choose fish and skinless chicken or [place]key more often than red meat. Limit fish, poultry, and meat to 6 oz (170 g) each  day. 13. Limit sweets, desserts, sugars, and sugary drinks. 14. Choose heart-healthy fats. 15. Limit cheese to 1 oz (28 g) per day. 16. Eat more home-cooked food and less restaurant, buffet, and fast food. 17. Limit fried foods. 18. Cook foods using methods other than frying. 19. Limit canned vegetables. If you do use them, rinse them well to decrease the sodium. 20. When eating at a restaurant, ask that your food be prepared with less salt, or no salt if possible. WHAT FOODS CAN I EAT? Seek help from a dietitian for individual calorie needs. Grains Whole grain or whole wheat bread. Brown rice. Whole grain or whole wheat pasta. Quinoa, bulgur, and whole grain cereals. Low-sodium cereals. Corn or whole wheat flour tortillas. Whole grain cornbread. Whole grain crackers. Low-sodium crackers. Vegetables Fresh or frozen vegetables (raw, steamed, roasted, or grilled). Low-sodium or reduced-sodium tomato and vegetable juices. Low-sodium or reduced-sodium tomato sauce and paste. Low-sodium or reduced-sodium canned vegetables.  Fruits All fresh, canned (in natural juice), or frozen fruits. Meat and Other Protein Products Ground beef (85% or leaner), grass-fed beef, or beef trimmed of fat. Skinless chicken or [place]key. Ground chicken or [place]key. Pork trimmed of fat. All fish and seafood. Eggs. Dried beans, peas, or lentils. Unsalted nuts and seeds. Unsalted canned beans. Dairy Low-fat dairy products, such as skim or 1% milk, 2% or reduced-fat cheeses, low-fat ricotta or cottage cheese,  or plain low-fat yogurt. Low-sodium or reduced-sodium cheeses. Fats and Oils Tub margarines without trans fats. Light or reduced-fat mayonnaise and salad dressings (reduced sodium). Avocado. Safflower, olive, or canola oils. Natural peanut or almond butter. Other Unsalted popcorn and pretzels. The items listed above may not be a complete list of recommended foods or beverages. Contact your dietitian for more options. WHAT  FOODS ARE NOT RECOMMENDED? Grains White bread. White pasta. White rice. Refined cornbread. Bagels and croissants. Crackers that contain trans fat. Vegetables Creamed or fried vegetables. Vegetables in a cheese sauce. Regular canned vegetables. Regular canned tomato sauce and paste. Regular tomato and vegetable juices. Fruits Dried fruits. Canned fruit in light or heavy syrup. Fruit juice. Meat and Other Protein Products Fatty cuts of meat. Ribs, chicken wings, bacon, sausage, bologna, salami, chitterlings, fatback, hot dogs, bratwurst, and packaged luncheon meats. Salted nuts and seeds. Canned beans with salt. Dairy Whole or 2% milk, cream, half-and-half, and cream cheese. Whole-fat or sweetened yogurt. Full-fat cheeses or blue cheese. Nondairy creamers and whipped toppings. Processed cheese, cheese spreads, or cheese curds. Condiments Onion and garlic salt, seasoned salt, table salt, and sea salt. Canned and packaged gravies. Worcestershire sauce. Tartar sauce. Barbecue sauce. Teriyaki sauce. Soy sauce, including reduced sodium. Steak sauce. Fish sauce. Oyster sauce. Cocktail sauce. Horseradish. Ketchup and mustard. Meat flavorings and tenderizers. Bouillon cubes. Hot sauce. Tabasco sauce. Marinades. Taco seasonings. Relishes. Fats and Oils Butter, stick margarine, lard, shortening, ghee, and bacon fat. Coconut, palm kernel, or palm oils. Regular salad dressings. Other Pickles and olives. Salted popcorn and pretzels. The items listed above may not be a complete list of foods and beverages to avoid. Contact your dietitian for more information. WHERE CAN I FIND MORE INFORMATION? National Heart, Lung, and Blood Institute: CablePromo.itwww.nhlbi.nih.gov/health/health-topics/topics/dash/ Document Released: 10/16/2011 Document Revised: 03/13/2014 Document Reviewed: 08/31/2013 [facility] Patient Information 2015 [place] [place]. This information is not intended to replace advice given to you by your health care  provider. Make sure you discuss any questions you have with your health care provider.

## 2014-09-21 NOTE — Progress Notes (Signed)
Assessment and Plan:  Hypertension:  increase cozaar to 100mg  daily, monitor blood pressure at home. Continue DASH diet.  Reminder to go to the ER if any CP, SOB, nausea, dizziness, severe HA, changes vision/speech, left arm numbness and tingling and jaw pain. Cholesterol: Continue diet and exercise. Check cholesterol.  Diabetes-Continue diet and exercise. Check A1C Vitamin D Def- check level and continue medications.  Hypothyroidism-check TSH level, continue medications the same, reminded to take on an empty stomach 30-5860mins before food.    Continue diet and meds as discussed. Further disposition pending results of labs. Discussed med's effects and SE's.   HPI 80 y.o. female  presents for 3 month follow up with hypertension, hyperlipidemia, diabetes and vitamin D. Her blood pressure has been uncontrolled at home running 160's, she does check BP at home, today their BP is BP: (!) 142/78 mmHg She does not workout. She denies chest pain, shortness of breath, dizziness.  She is on cholesterol medication and denies myalgias. Her cholesterol is at goal. 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 Last A1C  was: 06/14/2014: Hemoglobin-A1c 6.7* Patient is on Vitamin D supplement. 06/14/2014: Vit D, 25-Hydroxy 47 She is on thyroid medication. Her medication was not changed last visit.  Lab Results  Component Value Date   TSH 0.455 06/14/2014  .    Current Medications:  Current Outpatient Prescriptions on File Prior to Visit  Medication Sig Dispense Refill  . ALPRAZolam (XANAX) 1 MG tablet     . aspirin 81 MG tablet Take 81 mg by mouth daily.    [name] Kitchen. atenolol (TENORMIN) 100 MG tablet Take 1  tablet by mouth  daily 90 tablet 1  . atorvastatin (LIPITOR) 80 MG tablet Take 80 mg by mouth daily.    . citalopram (CELEXA) 40 MG tablet Take 40 mg by mouth daily.    [name] Kitchen. doxycycline (VIBRAMYCIN) 100 MG capsule Take 1 capsule (100 mg total) by mouth 2 (two) times daily. With food 60 capsule 0  . furosemide (LASIX) 40 MG tablet Take 1 tablet (40 mg total) by mouth daily. 90 tablet 4  . ibuprofen (ADVIL,MOTRIN) 100 MG tablet Take 200 mg by mouth daily as needed for pain or fever (hip pain).    [name] Kitchen. levothyroxine (SYNTHROID, LEVOTHROID) 100 MCG tablet Take 1 tablet (100 mcg total) by mouth daily before breakfast. 90 tablet 4  . losartan (COZAAR) 50 MG tablet Take 50 mg by mouth daily.    [name] Kitchen. mexiletine (MEXITIL) 150 MG capsule Take 150 mg by mouth daily.    . trazodone (DESYREL) 300 MG tablet Take 300 mg by mouth at bedtime.    . verapamil (COVERA HS) 240 MG (CO) 24 hr tablet Take 1 tablet (240 mg total) by mouth daily. 90 tablet 3   No current facility-administered medications on file prior to visit.   Medical History:  Past Medical History  Diagnosis Date  . Hypertension   . Hyperlipidemia   . Anxiety   . Depression   . Thyroid disease   . Anemia   . Type II or unspecified type diabetes mellitus with renal manifestations, not stated as uncontrolled     stage II  . History of  toe surgery left  . GERD (gastroesophageal reflux disease)    Allergies:  Allergies  Allergen Reactions  . Augmentin [Amoxicillin-Pot Clavulanate]     vaginitis  . Codeine     rash  . Hydrocodone     rash  . Hytrin [Terazosin]     incontinence   . Keflex [Cephalexin]     nausea  . Oxycodone     rash  . Procardia [Nifedipine]     edema  . Vasotec [Enalapril]     cough     Review of Systems: [X]  = complains of  [ ]  = denies  General: Fatigue [ ]  Fever [ ]  Chills [ ]  Weakness [ ]   Insomnia [ ]  Eyes: Redness [ ]  Blurred vision [ ]  Diplopia [ ]   ENT: Congestion [ ]  Sinus Pain [ ]  Post Nasal Drip [ ]   Sore Throat [ ]  Earache [ ]   Cardiac: Chest pain/pressure [ ]  SOB [ ]  Orthopnea [ ]   Palpitations [ ]   Paroxysmal nocturnal dyspnea[ ]  Claudication [ ]  Edema [ ]   Pulmonary: Cough [ ]  Wheezing[ ]   SOB [ ]   Snoring [ ]   GI: Nausea [ ]  Vomiting[ ]  Dysphagia[ ]  Heartburn[ ]  Abdominal pain [ ]  Constipation [ ] ; Diarrhea [ ] ; BRBPR [ ]  Melena[ ]  GU: Hematuria[ ]  Dysuria [ ]  Nocturia[ ]  Urgency [ ]   Hesitancy [ ]  Discharge [ ]  Neuro: Headaches[ ]  Vertigo[ ]  Paresthesias[ ]  Spasm [ ]  Speech changes [ ]  Incoordination [ ]   Ortho: Arthritis [ ]  Joint pain [ ]  Muscle pain [ ]  Joint swelling [ ]  Back Pain [ ]  Skin:  Rash [ ]   Pruritis [ ]  Change in skin lesion [ ]   Psych: Depression[ ]  Anxiety[ ]  Confusion [ ]  Memory loss [ ]   Heme/Lypmh: Bleeding [ ]  Bruising [ ]  Enlarged lymph nodes [ ]   Endocrine: Visual blurring [ ]  Paresthesia [ ]  Polyuria [ ]  Polydypsea [ ]    Heat/cold intolerance [ ]  Hypoglycemia [ ]   Family history- Review and unchanged Social history- Review and unchanged Physical Exam: BP 142/78 mmHg  Pulse 64  Temp(Src) 98.6 F (37 C)  Resp 16  Ht 5' 2.5" (1.588 m)  Wt 161 lb (73.029 kg)  BMI 28.96 kg/m2 Wt Readings from Last 3 Encounters:  09/21/14 161 lb (73.029 kg)  09/11/14 157 lb (71.215 kg)  09/05/14 157 lb 12.8 oz (71.578 kg)   General Appearance: Well nourished, in no apparent distress. Eyes: PERRLA, EOMs, conjunctiva no swelling or erythema Sinuses: No Frontal/maxillary tenderness ENT/Mouth: Ext aud canals clear, TMs without erythema, bulging. No erythema, swelling, or exudate on post pharynx.  Tonsils not swollen or erythematous. Hearing normal.  Neck: Supple, thyroid normal.  Respiratory: Respiratory effort normal, BS equal bilaterally without rales, rhonchi, wheezing or stridor.  Cardio: RRR with no MRGs. Brisk peripheral pulses with 1+ edema.  Abdomen: Soft, + BS.  Non tender, no guarding, rebound, hernias, masses. Lymphatics: Non tender without lymphadenopathy.   Musculoskeletal: Full ROM, 5/5 strength, gait antalgic Skin: Healing laceration/ecchymosis on left anterior shin. Warm, dry without rashes, lesions.  Neuro: Cranial nerves intact. No cerebellar symptoms. Sensation intact.  Psych: Awake and oriented X 3, normal affect, Insight and Judgment appropriate.    Quentin Mullingollier, Hughes Wyndham, PA-C 1:34 PM [facility]

## 2014-09-22 LAB — HEMOGLOBIN A1C
Hgb A1c MFr Bld: 6.8 % — ABNORMAL HIGH (ref <5.7)
MEAN PLASMA GLUCOSE: 148 mg/dL — AB (ref <117)

## 2014-09-22 LAB — TSH: TSH: 1.136 u[IU]/mL (ref 0.350–4.500)

## 2014-10-03 ENCOUNTER — Other Ambulatory Visit: Payer: Self-pay

## 2014-10-03 MED ORDER — CITALOPRAM HYDROBROMIDE 40 MG PO TABS: 40.0000 mg | ORAL_TABLET | Freq: Every day | ORAL | Status: DC

## 2014-10-20 ENCOUNTER — Other Ambulatory Visit: Payer: Self-pay | Admitting: Physician Assistant

## 2014-10-23 DIAGNOSIS — M542 Cervicalgia: Secondary | ICD-10-CM | POA: Diagnosis not present

## 2014-10-30 DIAGNOSIS — M7541 Impingement syndrome of right shoulder: Secondary | ICD-10-CM | POA: Diagnosis not present

## 2014-11-16 DIAGNOSIS — M75101 Unspecified rotator cuff tear or rupture of right shoulder, not specified as traumatic: Secondary | ICD-10-CM | POA: Diagnosis not present

## 2014-11-16 DIAGNOSIS — M5412 Radiculopathy, cervical region: Secondary | ICD-10-CM | POA: Diagnosis not present

## 2014-11-16 DIAGNOSIS — M7541 Impingement syndrome of right shoulder: Secondary | ICD-10-CM | POA: Diagnosis not present

## 2014-11-17 ENCOUNTER — Other Ambulatory Visit: Payer: Self-pay | Admitting: Internal Medicine

## 2014-11-21 DIAGNOSIS — M25511 Pain in right shoulder: Secondary | ICD-10-CM | POA: Diagnosis not present

## 2014-11-28 DIAGNOSIS — M75101 Unspecified rotator cuff tear or rupture of right shoulder, not specified as traumatic: Secondary | ICD-10-CM | POA: Diagnosis not present

## 2014-12-04 ENCOUNTER — Encounter: Payer: Self-pay | Admitting: Internal Medicine

## 2014-12-06 DIAGNOSIS — M67911 Unspecified disorder of synovium and tendon, right shoulder: Secondary | ICD-10-CM | POA: Diagnosis not present

## 2014-12-27 DIAGNOSIS — M67911 Unspecified disorder of synovium and tendon, right shoulder: Secondary | ICD-10-CM | POA: Diagnosis not present

## 2015-01-02 ENCOUNTER — Telehealth: Payer: Self-pay

## 2015-01-02 ENCOUNTER — Other Ambulatory Visit: Payer: Self-pay

## 2015-01-02 ENCOUNTER — Encounter: Payer: Self-pay | Admitting: Internal Medicine

## 2015-01-02 DIAGNOSIS — R011 Cardiac murmur, unspecified: Secondary | ICD-10-CM

## 2015-01-02 NOTE — Telephone Encounter (Signed)
Patient called and is requesting referral to cardiologist for heart murmur. Patient needs to schedule shoulder surgery but is unable to schedule surgery until cardio has evaluated heart murmur. Referral was send to [facility] and they will contact her to schedule appointment. Patient aware referral was sent. Patient advised to call our office if she has not been contacted by the end of the week to schedule appointment.

## 2015-01-17 ENCOUNTER — Ambulatory Visit (INDEPENDENT_AMBULATORY_CARE_PROVIDER_SITE_OTHER): Payer: Medicare Other | Admitting: Cardiology

## 2015-01-17 ENCOUNTER — Encounter: Payer: Self-pay | Admitting: Cardiology

## 2015-01-17 VITALS — BP 136/72 | HR 59 | Ht 62.5 in | Wt 157.0 lb

## 2015-01-17 DIAGNOSIS — I10 Essential (primary) hypertension: Secondary | ICD-10-CM

## 2015-01-17 DIAGNOSIS — Z01818 Encounter for other preprocedural examination: Secondary | ICD-10-CM

## 2015-01-17 DIAGNOSIS — R011 Cardiac murmur, unspecified: Secondary | ICD-10-CM | POA: Diagnosis not present

## 2015-01-17 NOTE — Progress Notes (Signed)
Patient ID: KIELEY AKTER, female   DOB: 09-Jul-1933, 81 y.o.   MRN: [id number]      Cardiology Office Note  Date:  01/17/2015   ID:  [name], DOB [birth year], MRN [id number]  PCP:  Nadean Corwin, MD  Cardiologist:  Lars Masson, MD   Chief complaint: Preoperative evaluation for systolic murmur.    History of Present Illness: [name] is a 81 y.o. female with prior medical history of hypertension and hyperlipidemia who is being referred to [place] for preop evaluation for systolic murmur. The patient has been scheduled for right shoulder surgery that was canceled because of finding of systolic murmur. The patient is fairly active and able to do all activities of daily living without any limitations with regards to shortness of breath or chest pain. She also denies palpitation or syncope. She doesn't have any cardiac history. She is compliant with her medicines for hypertension that is well controlled. She doesn't have a family history of premature coronary artery disease, sudden cardiac death or heart failure. She denies any orthopnea, paroxysmal nocturnal dyspnea or lower extremity edema.    Past Medical History  Diagnosis Date  . Hypertension   . Hyperlipidemia   . Anxiety   . Depression   . Thyroid disease   . Anemia   . Type II or unspecified type diabetes mellitus with renal manifestations, not stated as uncontrolled     stage II  . History of toe surgery left  . GERD (gastroesophageal reflux disease)     Past Surgical History  Procedure Laterality Date  . Knee surgery    . Abdominal hysterectomy    . Tonsillectomy    . Appendectomy    . Cholecystectomy    . Cerebral aneurysm repair  09/2007    coil ablation  . Joint replacement      right TKR X 2, Left TKR X1     Current Outpatient Prescriptions  Medication Sig Dispense Refill  . aspirin 81 MG tablet Take 81 mg by mouth daily.    [name] Kitchen atenolol (TENORMIN) 100 MG tablet Take 1 tablet by mouth  daily 90  tablet 1  . atorvastatin (LIPITOR) 80 MG tablet Take 1 tablet by mouth  daily for cholesterol 120 tablet 99  . citalopram (CELEXA) 40 MG tablet Take 1 tablet (40 mg total) by mouth daily. 180 tablet 3  . furosemide (LASIX) 40 MG tablet Take 1 tablet (40 mg total) by mouth daily. 90 tablet 4  . HYDROcodone-acetaminophen (NORCO/VICODIN) 5-325 MG per tablet Take 1 tablet by mouth 2 (two) times daily between meals as needed.  0  . ibuprofen (ADVIL,MOTRIN) 100 MG tablet Take 200 mg by mouth daily as needed for pain or fever (hip pain).    [name] Kitchen losartan (COZAAR) 100 MG tablet Take 1 tablet by mouth daily.  0  . mexiletine (MEXITIL) 150 MG capsule Take 150 mg by mouth daily.    [name] Kitchen SYNTHROID 100 MCG tablet take 1 tablet by mouth once daily 90 tablet 4  . traZODone (DESYREL) 100 MG tablet Take 2 tablets by mouth every evening.  9  . verapamil (CALAN-SR) 120 MG CR tablet Take 1 tablet by mouth daily.     No current facility-administered medications for this visit.    Allergies:   Augmentin; Codeine; Hydrocodone; Hytrin; Keflex; Oxycodone; Procardia; and Vasotec    Social History:  The patient  reports that she has never smoked. She has never used smokeless tobacco. She reports that  she does not drink alcohol or use illicit drugs.   Family History:  The patient's family history includes Cancer in her father, maternal aunt, and mother; Hypertension in her father and mother; Mental illness in her mother.    ROS:  Please see the history of present illness.     All other systems are reviewed and negative.    PHYSICAL EXAM: VS:  BP 136/72 mmHg  Pulse 59  Ht 5' 2.5" (1.588 m)  Wt 157 lb (71.215 kg)  BMI 28.24 kg/m2 , BMI Body mass index is 28.24 kg/(m^2). GEN: Well nourished, well developed, in no acute distress HEENT: normal Neck: no JVD, carotid bruits, or masses Cardiac: RRR; 3/6 systolic murmurs, rubs, or gallops,no edema  Respiratory:  clear to auscultation bilaterally, normal work of  breathing GI: soft, nontender, nondistended, + BS MS: no deformity or atrophy Skin: warm and dry, no rash Neuro:  Strength and sensation are intact Psych: euthymic mood, full affect   EKG:  EKG is ordered today. The ekg ordered today demonstrates sinus rhythm, nonspecific ST and T-wave abnormalities.   Recent Labs: 09/21/2014: ALT 16; BUN 19; Creatinine 1.04; Hemoglobin 11.1*; Magnesium 2.2; Platelets 334; Potassium 4.1; Sodium 141; TSH 1.136    Lipid Panel    Component Value Date/Time   CHOL 121 09/21/2014 1353   TRIG 81 09/21/2014 1353   HDL 58 09/21/2014 1353   CHOLHDL 2.1 09/21/2014 1353   VLDL 16 09/21/2014 1353   LDLCALC 47 09/21/2014 1353      Wt Readings from Last 3 Encounters:  01/17/15 157 lb (71.215 kg)  09/21/14 161 lb (73.029 kg)  09/11/14 157 lb (71.215 kg)      ASSESSMENT AND PLAN:  A very pleasant 81 year old, younger appearing female  1. Preoperative evaluation - the patient has no signs suggestive of angina or congestive heart failure. She certainly able to achieve at least 4 METS. Her only contraindication to surgery at this point possibly be severe valvular abnormality and her physical exam and clinical presentation is not suggestive of it. We will however order an echocardiogram to evaluate for her murmur.  We will call the patient with the results and fax a clearance form to orthopedic surgeon.  2. Systolic murmur - we will order an echocardiogram.  3. Hypertension - well controlled with losartan and atenolol.  4. Hyperlipidemia - managed by primary care physician.  Current medicines are reviewed at length with the patient today.  The patient does not have concerns regarding medicines.  The following changes have been made:  no change  Labs/ tests ordered today include: echocardiogram.    Disposition:   FU with Charlesia Canaday H  in 1 year.   Signed, Lars MassonNELSON, Sachit Gilman H, MD  01/17/2015 3:23 PM    [facility] [address], [place], [place]  [id number] Phone: [phone]; Fax: [phone]

## 2015-01-17 NOTE — Patient Instructions (Signed)
Your physician recommends that you continue on your current medications as directed. Please refer to the Current Medication list given to you today.    Your physician has requested that you have an echocardiogram. Echocardiography is a painless test that uses sound waves to create images of your heart. It provides your doctor with information about the size and shape of your heart and how well your heart's chambers and valves are working. This procedure takes approximately one hour. There are no restrictions for this procedure.  DR Delton SeeNELSON WOULD LIKE FOR YOU TO HAVE THIS DONE AS SOON AS POSSIBLE TO EXPEDITE YOUR SURGICAL CLEARANCE     Your physician recommends that you schedule a follow-up appointment in: AS NEEDED WITH DR Delton SeeNELSON

## 2015-01-18 ENCOUNTER — Ambulatory Visit (HOSPITAL_COMMUNITY)
Admission: RE | Admit: 2015-01-18 | Discharge: 2015-01-18 | Disposition: A | Discharge status: Home / Self Care | Payer: Medicare Other | Source: Ambulatory Visit | Attending: Cardiology | Admitting: Cardiology

## 2015-01-18 DIAGNOSIS — I10 Essential (primary) hypertension: Secondary | ICD-10-CM | POA: Insufficient documentation

## 2015-01-18 DIAGNOSIS — R011 Cardiac murmur, unspecified: Secondary | ICD-10-CM | POA: Diagnosis not present

## 2015-01-18 DIAGNOSIS — Z01818 Encounter for other preprocedural examination: Secondary | ICD-10-CM

## 2015-01-18 NOTE — Progress Notes (Signed)
2D Echocardiogram Complete.  01/18/2015   Camil Hausmann, RDCS  

## 2015-01-22 ENCOUNTER — Other Ambulatory Visit: Payer: Self-pay | Admitting: Physician Assistant

## 2015-01-22 ENCOUNTER — Other Ambulatory Visit: Payer: Self-pay | Admitting: Orthopedic Surgery

## 2015-01-23 ENCOUNTER — Encounter (HOSPITAL_BASED_OUTPATIENT_CLINIC_OR_DEPARTMENT_OTHER): Payer: Self-pay | Admitting: *Deleted

## 2015-01-23 NOTE — Progress Notes (Signed)
To come in for labs-just saw cardiology for clearance-echo good-has had multiple surgeries, has done well-lots of family support

## 2015-01-24 ENCOUNTER — Encounter (HOSPITAL_BASED_OUTPATIENT_CLINIC_OR_DEPARTMENT_OTHER)
Admission: RE | Admit: 2015-01-24 | Discharge: 2015-01-24 | Disposition: A | Discharge status: Home / Self Care | Payer: Medicare Other | Source: Ambulatory Visit | Attending: Orthopedic Surgery | Admitting: Orthopedic Surgery

## 2015-01-24 DIAGNOSIS — I10 Essential (primary) hypertension: Secondary | ICD-10-CM | POA: Diagnosis not present

## 2015-01-24 DIAGNOSIS — Z7982 Long term (current) use of aspirin: Secondary | ICD-10-CM | POA: Diagnosis not present

## 2015-01-24 DIAGNOSIS — F419 Anxiety disorder, unspecified: Secondary | ICD-10-CM | POA: Diagnosis not present

## 2015-01-24 DIAGNOSIS — M75101 Unspecified rotator cuff tear or rupture of right shoulder, not specified as traumatic: Secondary | ICD-10-CM | POA: Diagnosis not present

## 2015-01-24 DIAGNOSIS — K219 Gastro-esophageal reflux disease without esophagitis: Secondary | ICD-10-CM | POA: Diagnosis not present

## 2015-01-24 DIAGNOSIS — Z90710 Acquired absence of both cervix and uterus: Secondary | ICD-10-CM | POA: Diagnosis not present

## 2015-01-24 DIAGNOSIS — Z96652 Presence of left artificial knee joint: Secondary | ICD-10-CM | POA: Diagnosis not present

## 2015-01-24 DIAGNOSIS — E785 Hyperlipidemia, unspecified: Secondary | ICD-10-CM | POA: Diagnosis not present

## 2015-01-24 DIAGNOSIS — F329 Major depressive disorder, single episode, unspecified: Secondary | ICD-10-CM | POA: Diagnosis not present

## 2015-01-24 DIAGNOSIS — M19011 Primary osteoarthritis, right shoulder: Secondary | ICD-10-CM | POA: Diagnosis not present

## 2015-01-24 DIAGNOSIS — E119 Type 2 diabetes mellitus without complications: Secondary | ICD-10-CM | POA: Diagnosis not present

## 2015-01-24 DIAGNOSIS — Z888 Allergy status to other drugs, medicaments and biological substances status: Secondary | ICD-10-CM | POA: Diagnosis not present

## 2015-01-24 DIAGNOSIS — Z881 Allergy status to other antibiotic agents status: Secondary | ICD-10-CM | POA: Diagnosis not present

## 2015-01-24 DIAGNOSIS — Z886 Allergy status to analgesic agent status: Secondary | ICD-10-CM | POA: Diagnosis not present

## 2015-01-24 LAB — BASIC METABOLIC PANEL
Anion gap: 9 (ref 5–15)
BUN: 15 mg/dL (ref 6–23)
CHLORIDE: 98 mmol/L (ref 96–112)
CO2: 33 mmol/L — ABNORMAL HIGH (ref 19–32)
Calcium: 9.7 mg/dL (ref 8.4–10.5)
Creatinine, Ser: 1.15 mg/dL — ABNORMAL HIGH (ref 0.50–1.10)
GFR calc non Af Amer: 44 mL/min — ABNORMAL LOW (ref >90)
GFR, EST AFRICAN AMERICAN: 51 mL/min — AB (ref >90)
GLUCOSE: 117 mg/dL — AB (ref 70–99)
POTASSIUM: 3.7 mmol/L (ref 3.5–5.1)
Sodium: 140 mmol/L (ref 135–145)

## 2015-01-26 ENCOUNTER — Other Ambulatory Visit: Payer: Self-pay | Admitting: Physician Assistant

## 2015-01-29 ENCOUNTER — Ambulatory Visit (HOSPITAL_BASED_OUTPATIENT_CLINIC_OR_DEPARTMENT_OTHER): Payer: Medicare Other | Admitting: Anesthesiology

## 2015-01-29 ENCOUNTER — Ambulatory Visit (HOSPITAL_BASED_OUTPATIENT_CLINIC_OR_DEPARTMENT_OTHER)
Admission: RE | Admit: 2015-01-29 | Discharge: 2015-01-29 | Disposition: A | Discharge status: Home / Self Care | Payer: Medicare Other | Source: Ambulatory Visit | Attending: Orthopedic Surgery | Admitting: Orthopedic Surgery

## 2015-01-29 ENCOUNTER — Encounter (HOSPITAL_BASED_OUTPATIENT_CLINIC_OR_DEPARTMENT_OTHER): Payer: Self-pay

## 2015-01-29 ENCOUNTER — Encounter (HOSPITAL_BASED_OUTPATIENT_CLINIC_OR_DEPARTMENT_OTHER)
Admission: RE | Disposition: A | Discharge status: Home / Self Care | Payer: Self-pay | Source: Ambulatory Visit | Attending: Orthopedic Surgery

## 2015-01-29 DIAGNOSIS — M7541 Impingement syndrome of right shoulder: Secondary | ICD-10-CM | POA: Diagnosis not present

## 2015-01-29 DIAGNOSIS — M75101 Unspecified rotator cuff tear or rupture of right shoulder, not specified as traumatic: Secondary | ICD-10-CM | POA: Diagnosis not present

## 2015-01-29 DIAGNOSIS — G8918 Other acute postprocedural pain: Secondary | ICD-10-CM | POA: Diagnosis not present

## 2015-01-29 DIAGNOSIS — Z888 Allergy status to other drugs, medicaments and biological substances status: Secondary | ICD-10-CM | POA: Insufficient documentation

## 2015-01-29 DIAGNOSIS — M19011 Primary osteoarthritis, right shoulder: Secondary | ICD-10-CM | POA: Insufficient documentation

## 2015-01-29 DIAGNOSIS — F329 Major depressive disorder, single episode, unspecified: Secondary | ICD-10-CM | POA: Insufficient documentation

## 2015-01-29 DIAGNOSIS — I10 Essential (primary) hypertension: Secondary | ICD-10-CM | POA: Diagnosis not present

## 2015-01-29 DIAGNOSIS — Z96652 Presence of left artificial knee joint: Secondary | ICD-10-CM | POA: Insufficient documentation

## 2015-01-29 DIAGNOSIS — E785 Hyperlipidemia, unspecified: Secondary | ICD-10-CM | POA: Diagnosis not present

## 2015-01-29 DIAGNOSIS — Z886 Allergy status to analgesic agent status: Secondary | ICD-10-CM | POA: Insufficient documentation

## 2015-01-29 DIAGNOSIS — E119 Type 2 diabetes mellitus without complications: Secondary | ICD-10-CM | POA: Insufficient documentation

## 2015-01-29 DIAGNOSIS — M75121 Complete rotator cuff tear or rupture of right shoulder, not specified as traumatic: Secondary | ICD-10-CM | POA: Diagnosis not present

## 2015-01-29 DIAGNOSIS — K219 Gastro-esophageal reflux disease without esophagitis: Secondary | ICD-10-CM | POA: Insufficient documentation

## 2015-01-29 DIAGNOSIS — Z90710 Acquired absence of both cervix and uterus: Secondary | ICD-10-CM | POA: Insufficient documentation

## 2015-01-29 DIAGNOSIS — F419 Anxiety disorder, unspecified: Secondary | ICD-10-CM | POA: Insufficient documentation

## 2015-01-29 DIAGNOSIS — Z7982 Long term (current) use of aspirin: Secondary | ICD-10-CM | POA: Insufficient documentation

## 2015-01-29 DIAGNOSIS — M25511 Pain in right shoulder: Secondary | ICD-10-CM | POA: Diagnosis not present

## 2015-01-29 DIAGNOSIS — Z881 Allergy status to other antibiotic agents status: Secondary | ICD-10-CM | POA: Insufficient documentation

## 2015-01-29 HISTORY — PX: SHOULDER ARTHROSCOPY WITH ROTATOR CUFF REPAIR AND SUBACROMIAL DECOMPRESSION: SHX5686

## 2015-01-29 HISTORY — DX: Presence of spectacles and contact lenses: Z97.3

## 2015-01-29 LAB — POCT HEMOGLOBIN-HEMACUE: Hemoglobin: 12.2 g/dL (ref 12.0–15.0)

## 2015-01-29 SURGERY — SHOULDER ARTHROSCOPY WITH ROTATOR CUFF REPAIR AND SUBACROMIAL DECOMPRESSION
Anesthesia: Monitor Anesthesia Care | Site: Shoulder | Laterality: Right

## 2015-01-29 MED ORDER — ONDANSETRON HCL 4 MG/2ML IJ SOLN: 4.0000 mg | Freq: Once | INTRAMUSCULAR | Status: DC | PRN

## 2015-01-29 MED ORDER — CEFAZOLIN SODIUM-DEXTROSE 2-3 GM-% IV SOLR
INTRAVENOUS | Status: AC
  Filled 2015-01-29: qty 50

## 2015-01-29 MED ORDER — LACTATED RINGERS IV SOLN
INTRAVENOUS | Status: DC
  Administered 2015-01-29 (×2): via INTRAVENOUS

## 2015-01-29 MED ORDER — EPHEDRINE SULFATE 50 MG/ML IJ SOLN
INTRAMUSCULAR | Status: DC | PRN
  Administered 2015-01-29 (×3): 10 mg via INTRAVENOUS

## 2015-01-29 MED ORDER — FENTANYL CITRATE 0.05 MG/ML IJ SOLN
INTRAMUSCULAR | Status: AC
  Filled 2015-01-29: qty 2

## 2015-01-29 MED ORDER — SUCCINYLCHOLINE CHLORIDE 20 MG/ML IJ SOLN
INTRAMUSCULAR | Status: DC | PRN
  Administered 2015-01-29: 100 mg via INTRAVENOUS

## 2015-01-29 MED ORDER — MIDAZOLAM HCL 2 MG/2ML IJ SOLN: 1.0000 mg | INTRAMUSCULAR | Status: DC | PRN

## 2015-01-29 MED ORDER — BUPIVACAINE-EPINEPHRINE (PF) 0.5% -1:200000 IJ SOLN
INTRAMUSCULAR | Status: DC | PRN
  Administered 2015-01-29: 22 mL via PERINEURAL

## 2015-01-29 MED ORDER — FENTANYL CITRATE 0.05 MG/ML IJ SOLN
50.0000 ug | INTRAMUSCULAR | Status: DC | PRN
  Administered 2015-01-29: 100 ug via INTRAVENOUS

## 2015-01-29 MED ORDER — PROPOFOL 10 MG/ML IV BOLUS
INTRAVENOUS | Status: DC | PRN
  Administered 2015-01-29: 50 mg via INTRAVENOUS
  Administered 2015-01-29: 100 mg via INTRAVENOUS

## 2015-01-29 MED ORDER — POVIDONE-IODINE 7.5 % EX SOLN: Freq: Once | CUTANEOUS | Status: DC

## 2015-01-29 MED ORDER — PROPOFOL 10 MG/ML IV BOLUS
INTRAVENOUS | Status: AC
  Filled 2015-01-29: qty 20

## 2015-01-29 MED ORDER — FENTANYL CITRATE 0.05 MG/ML IJ SOLN
INTRAMUSCULAR | Status: DC | PRN
  Administered 2015-01-29 (×2): 25 ug via INTRAVENOUS

## 2015-01-29 MED ORDER — DEXAMETHASONE SODIUM PHOSPHATE 4 MG/ML IJ SOLN
INTRAMUSCULAR | Status: DC | PRN
  Administered 2015-01-29: 10 mg via INTRAVENOUS

## 2015-01-29 MED ORDER — FENTANYL CITRATE 0.05 MG/ML IJ SOLN: 25.0000 ug | INTRAMUSCULAR | Status: DC | PRN

## 2015-01-29 MED ORDER — MIDAZOLAM HCL 2 MG/2ML IJ SOLN
INTRAMUSCULAR | Status: AC
  Filled 2015-01-29: qty 2

## 2015-01-29 MED ORDER — HYDROCODONE-ACETAMINOPHEN 5-325 MG PO TABS: 1.0000 | ORAL_TABLET | ORAL | Status: DC | PRN

## 2015-01-29 MED ORDER — DOCUSATE SODIUM 100 MG PO CAPS: 100.0000 mg | ORAL_CAPSULE | Freq: Three times a day (TID) | ORAL | Status: DC | PRN

## 2015-01-29 MED ORDER — LIDOCAINE HCL (CARDIAC) 10 MG/ML IV SOLN
INTRAVENOUS | Status: DC | PRN
  Administered 2015-01-29: 60 mg via INTRAVENOUS

## 2015-01-29 MED ORDER — CEFAZOLIN SODIUM-DEXTROSE 2-3 GM-% IV SOLR
INTRAVENOUS | Status: DC | PRN
  Administered 2015-01-29: 2 g via INTRAVENOUS

## 2015-01-29 MED ORDER — GLYCOPYRROLATE 0.2 MG/ML IJ SOLN
INTRAMUSCULAR | Status: DC | PRN
  Administered 2015-01-29: 0.2 mg via INTRAVENOUS

## 2015-01-29 MED ORDER — FENTANYL CITRATE 0.05 MG/ML IJ SOLN
INTRAMUSCULAR | Status: AC
  Filled 2015-01-29: qty 6

## 2015-01-29 MED ORDER — CLINDAMYCIN PHOSPHATE 900 MG/50ML IV SOLN: 900.0000 mg | INTRAVENOUS | Status: DC

## 2015-01-29 SURGICAL SUPPLY — 82 items
ANCHOR PEEK 4.75X19.1 SWLK C (Anchor) ×6 IMPLANT
BENZOIN TINCTURE PRP APPL 2/3 (GAUZE/BANDAGES/DRESSINGS) IMPLANT
BLADE CLIPPER SURG (BLADE) IMPLANT
BLADE SURG 15 STRL LF DISP TIS (BLADE) IMPLANT
BLADE SURG 15 STRL SS (BLADE)
BUR OVAL 4.0 (BURR) ×3 IMPLANT
CANNULA 5.75X71 LONG (CANNULA) ×3 IMPLANT
CANNULA TWIST IN 8.25X7CM (CANNULA) ×3 IMPLANT
CHLORAPREP W/TINT 26ML (MISCELLANEOUS) ×3 IMPLANT
CLOSURE WOUND 1/2 X4 (GAUZE/BANDAGES/DRESSINGS)
DECANTER SPIKE VIAL GLASS SM (MISCELLANEOUS) IMPLANT
DRAPE INCISE IOBAN 66X45 STRL (DRAPES) ×3 IMPLANT
DRAPE STERI 35X30 U-POUCH (DRAPES) ×3 IMPLANT
DRAPE SURG 17X23 STRL (DRAPES) ×3 IMPLANT
DRAPE U 20/CS (DRAPES) ×3 IMPLANT
DRAPE U-SHAPE 47X51 STRL (DRAPES) ×3 IMPLANT
DRAPE U-SHAPE 76X120 STRL (DRAPES) ×6 IMPLANT
DRSG PAD ABDOMINAL 8X10 ST (GAUZE/BANDAGES/DRESSINGS) ×3 IMPLANT
ELECT REM PT RETURN 9FT ADLT (ELECTROSURGICAL) ×3
ELECTRODE REM PT RTRN 9FT ADLT (ELECTROSURGICAL) ×1 IMPLANT
GAUZE SPONGE 4X4 12PLY STRL (GAUZE/BANDAGES/DRESSINGS) ×3 IMPLANT
GAUZE SPONGE 4X4 16PLY XRAY LF (GAUZE/BANDAGES/DRESSINGS) IMPLANT
GAUZE XEROFORM 1X8 LF (GAUZE/BANDAGES/DRESSINGS) ×3 IMPLANT
GLOVE BIO SURGEON STRL SZ7 (GLOVE) ×3 IMPLANT
GLOVE BIO SURGEON STRL SZ7.5 (GLOVE) ×3 IMPLANT
GLOVE BIOGEL PI IND STRL 7.0 (GLOVE) ×2 IMPLANT
GLOVE BIOGEL PI IND STRL 8 (GLOVE) ×1 IMPLANT
GLOVE BIOGEL PI INDICATOR 7.0 (GLOVE) ×4
GLOVE BIOGEL PI INDICATOR 8 (GLOVE) ×2
GOWN STRL REUS W/ TWL LRG LVL3 (GOWN DISPOSABLE) ×2 IMPLANT
GOWN STRL REUS W/ TWL XL LVL3 (GOWN DISPOSABLE) ×1 IMPLANT
GOWN STRL REUS W/TWL LRG LVL3 (GOWN DISPOSABLE) ×4
GOWN STRL REUS W/TWL XL LVL3 (GOWN DISPOSABLE) ×2
LASSO CRESCENT QUICKPASS (SUTURE) IMPLANT
LIQUID BAND (GAUZE/BANDAGES/DRESSINGS) IMPLANT
MANIFOLD NEPTUNE II (INSTRUMENTS) ×3 IMPLANT
NDL SUT 6 .5 CRC .975X.05 MAYO (NEEDLE) IMPLANT
NEEDLE 1/2 CIR CATGUT .05X1.09 (NEEDLE) IMPLANT
NEEDLE MAYO TAPER (NEEDLE)
NEEDLE SCORPION MULTI FIRE (NEEDLE) ×3 IMPLANT
NS IRRIG 1000ML POUR BTL (IV SOLUTION) IMPLANT
PACK ARTHROSCOPY DSU (CUSTOM PROCEDURE TRAY) ×3 IMPLANT
PACK BASIN DAY SURGERY FS (CUSTOM PROCEDURE TRAY) ×3 IMPLANT
PENCIL BUTTON HOLSTER BLD 10FT (ELECTRODE) IMPLANT
RESECTOR FULL RADIUS 4.2MM (BLADE) ×3 IMPLANT
SHEET MEDIUM DRAPE 40X70 STRL (DRAPES) IMPLANT
SLEEVE SCD COMPRESS KNEE MED (MISCELLANEOUS) ×3 IMPLANT
SLING ARM IMMOBILIZER MED (SOFTGOODS) ×3 IMPLANT
SLING ARM LRG ADULT FOAM STRAP (SOFTGOODS) IMPLANT
SLING ARM MED ADULT FOAM STRAP (SOFTGOODS) IMPLANT
SLING ARM XL FOAM STRAP (SOFTGOODS) IMPLANT
SPONGE LAP 4X18 X RAY DECT (DISPOSABLE) IMPLANT
STRIP CLOSURE SKIN 1/2X4 (GAUZE/BANDAGES/DRESSINGS) IMPLANT
SUCTION FRAZIER TIP 10 FR DISP (SUCTIONS) IMPLANT
SUPPORT WRAP ARM LG (MISCELLANEOUS) ×3 IMPLANT
SUT BONE WAX W31G (SUTURE) IMPLANT
SUT ETHIBOND 2 OS 4 DA (SUTURE) IMPLANT
SUT ETHILON 3 0 PS 1 (SUTURE) ×3 IMPLANT
SUT ETHILON 4 0 PS 2 18 (SUTURE) IMPLANT
SUT FIBERWIRE #2 38 T-5 BLUE (SUTURE)
SUT FIBERWIRE 2-0 18 17.9 3/8 (SUTURE)
SUT MNCRL AB 3-0 PS2 18 (SUTURE) IMPLANT
SUT MNCRL AB 4-0 PS2 18 (SUTURE) IMPLANT
SUT PDS AB 0 CT 36 (SUTURE) IMPLANT
SUT PROLENE 3 0 PS 2 (SUTURE) IMPLANT
SUT TIGER TAPE 7 IN WHITE (SUTURE) ×3 IMPLANT
SUT VIC AB 0 CT1 27 (SUTURE)
SUT VIC AB 0 CT1 27XBRD ANBCTR (SUTURE) IMPLANT
SUT VIC AB 2-0 SH 27 (SUTURE)
SUT VIC AB 2-0 SH 27XBRD (SUTURE) IMPLANT
SUTURE FIBERWR #2 38 T-5 BLUE (SUTURE) IMPLANT
SUTURE FIBERWR 2-0 18 17.9 3/8 (SUTURE) IMPLANT
SYR BULB 3OZ (MISCELLANEOUS) IMPLANT
TAPE FIBER 2MM 7IN #2 BLUE (SUTURE) ×3 IMPLANT
TOWEL OR 17X24 6PK STRL BLUE (TOWEL DISPOSABLE) ×3 IMPLANT
TOWEL OR NON WOVEN STRL DISP B (DISPOSABLE) ×3 IMPLANT
TUBE CONNECTING 20'X1/4 (TUBING) ×1
TUBE CONNECTING 20X1/4 (TUBING) ×2 IMPLANT
TUBING ARTHROSCOPY IRRIG 16FT (MISCELLANEOUS) ×3 IMPLANT
WAND STAR VAC 90 (SURGICAL WAND) ×3 IMPLANT
WATER STERILE IRR 1000ML POUR (IV SOLUTION) ×3 IMPLANT
YANKAUER SUCT BULB TIP NO VENT (SUCTIONS) IMPLANT

## 2015-01-29 NOTE — Progress Notes (Signed)
Assisted Dr. Crews with right, ultrasound guided, supraclavicular block. Side rails up, monitors on throughout procedure. See vital signs in flow sheet. Tolerated Procedure well. 

## 2015-01-29 NOTE — Anesthesia Preprocedure Evaluation (Signed)
Anesthesia Evaluation  Patient identified by MRN, date of birth, ID band Patient awake    Reviewed: Allergy & Precautions, NPO status , Patient's Chart, lab work & pertinent test results  Airway Mallampati: I  TM Distance: >3 FB Neck ROM: Full    Dental  (+) Teeth Intact, Dental Advisory Given   Pulmonary  breath sounds clear to auscultation        Cardiovascular hypertension, Pt. on medications Rhythm:Regular Rate:Normal     Neuro/Psych    GI/Hepatic   Endo/Other  diabetes, Well Controlled, Type 2  Renal/GU      Musculoskeletal   Abdominal   Peds  Hematology   Anesthesia Other Findings   Reproductive/Obstetrics                             Anesthesia Physical Anesthesia Plan  ASA: III  Anesthesia Plan: MAC   Post-op Pain Management:    Induction: Intravenous  Airway Management Planned: Oral ETT  Additional Equipment:   Intra-op Plan:   Post-operative Plan: Extubation in OR  Informed Consent: I have reviewed the patients History and Physical, chart, labs and discussed the procedure including the risks, benefits and alternatives for the proposed anesthesia with the patient or authorized representative who has indicated his/her understanding and acceptance.   Dental advisory given  Plan Discussed with: CRNA, Anesthesiologist and Surgeon  Anesthesia Plan Comments:         Anesthesia Quick Evaluation

## 2015-01-29 NOTE — Anesthesia Procedure Notes (Addendum)
Anesthesia Regional Block:  Interscalene brachial plexus block  Pre-Anesthetic Checklist: ,, timeout performed, Correct Patient, Correct Site, Correct Laterality, Correct Procedure, Correct Position, site marked, Risks and benefits discussed,  Surgical consent,  Pre-op evaluation,  At surgeon's request and post-op pain management  Laterality: Right and Upper  Prep: chloraprep       Needles:  Injection technique: Single-shot  Needle Type: Echogenic Needle     Needle Length: 5cm 5 cm Needle Gauge: 21 and 21 G    Additional Needles:  Procedures: ultrasound guided (picture in chart) Interscalene brachial plexus block Narrative:  Start time: 01/29/2015 11:14 AM End time: 01/29/2015 11:19 AM Injection made incrementally with aspirations every 5 mL.  Performed by: Personally  Anesthesiologist: CREWS, DAVID   Procedure Name: Intubation Date/Time: 01/29/2015 11:48 AM Performed by: Gar GibbonKEETON, Preslyn Warr S Pre-anesthesia Checklist: Patient identified, Emergency Drugs available, Suction available and Patient being monitored Patient Re-evaluated:Patient Re-evaluated prior to inductionOxygen Delivery Method: Circle System Utilized Preoxygenation: Pre-oxygenation with 100% oxygen Intubation Type: IV induction Ventilation: Mask ventilation without difficulty Laryngoscope Size: Mac and 3 Grade View: Grade III Tube type: Oral Tube size: 7.0 mm Number of attempts: 1 Airway Equipment and Method: Stylet and Oral airway Placement Confirmation: ETT inserted through vocal cords under direct vision,  positive ETCO2 and breath sounds checked- equal and bilateral Secured at: 21 cm Tube secured with: Tape Dental Injury: Teeth and Oropharynx as per pre-operative assessment

## 2015-01-29 NOTE — Discharge Instructions (Signed)
Discharge Instructions after Arthroscopic Shoulder Repair ° ° °A sling has been provided for you. Remain in your sling at all times. This includes sleeping in your sling.  °Use ice on the shoulder intermittently over the first 48 hours after surgery.  °Pain medicine has been prescribed for you.  °Use your medicine liberally over the first 48 hours, and then you can begin to taper your use. You may take Extra Strength Tylenol or Tylenol only in place of the pain pills. DO NOT take ANY nonsteroidal anti-inflammatory pain medications: Advil, Motrin, Ibuprofen, Aleve, Naproxen, or Narprosyn.  °You may remove your dressing after two days. If the incision sites are still moist, place a Band-Aid over the moist site(s). Change Band-Aids daily until dry.  °You may shower 5 days after surgery. The incisions CANNOT get wet prior to 5 days. Simply allow the water to wash over the site and then pat dry. Do not rub the incisions. Make sure your axilla (armpit) is completely dry after showering.  °Take one aspirin a day for 2 weeks after surgery, unless you have an aspirin sensitivity/ allergy or asthma. ° ° °Please call [phone] during normal business hours or [phone] after hours for any problems. Including the following: ° °- excessive redness of the incisions °- drainage for more than 4 days °- fever of more than 101.5 F ° °*Please note that pain medications will not be refilled after hours or on weekends. ° ° ° °Post Anesthesia Home Care Instructions ° °Activity: °Get plenty of rest for the remainder of the day. A responsible adult should stay with you for 24 hours following the procedure.  °For the next 24 hours, DO NOT: °-Drive a car °-Operate machinery °-Drink alcoholic beverages °-Take any medication unless instructed by your physician °-Make any legal decisions or sign important papers. ° °Meals: °Start with liquid foods such as gelatin or soup. Progress to regular foods as tolerated. Avoid greasy, spicy, heavy  foods. If nausea and/or vomiting occur, drink only clear liquids until the nausea and/or vomiting subsides. Call your physician if vomiting continues. ° °Special Instructions/Symptoms: °Your throat may feel dry or sore from the anesthesia or the breathing tube placed in your throat during surgery. If this causes discomfort, gargle with warm salt water. The discomfort should disappear within 24 hours. ° ° °Regional Anesthesia Blocks ° °1. Numbness or the inability to move the "blocked" extremity may last from 3-48 hours after placement. The length of time depends on the medication injected and your individual response to the medication. If the numbness is not going away after 48 hours, call your surgeon. ° °2. The extremity that is blocked will need to be protected until the numbness is gone and the  Strength has returned. Because you cannot feel it, you will need to take extra care to avoid injury. Because it may be weak, you may have difficulty moving it or using it. You may not know what position it is in without looking at it while the block is in effect. ° °3. For blocks in the legs and feet, returning to weight bearing and walking needs to be done carefully. You will need to wait until the numbness is entirely gone and the strength has returned. You should be able to move your leg and foot normally before you try and bear weight or walk. You will need someone to be with you when you first try to ensure you do not fall and possibly risk injury. ° °4. Bruising and tenderness   at the needle site are common side effects and will resolve in a few days. ° °5. Persistent numbness or new problems with movement should be communicated to the surgeon or the [facility] ([phone])/ [facility] ([phone]). °

## 2015-01-29 NOTE — Transfer of Care (Signed)
Immediate Anesthesia Transfer of Care Note  Patient: [name]  Procedure(s) Performed: Procedure(s) with comments: RIGHT SHOULDER ARTHROSCOPY WITH DEBRIDEMENT ROTATOR CUFF TEAR, SUBACROMIAL DECOMPRESSION, DISTAL CLAVICAL RESECTION (Right) - Right shoulder arthroscopy debridement rotatoro cuff tear, subacromial decompression, distal clavical resection  Patient Location: PACU  Anesthesia Type:GA combined with regional for post-op pain  Level of Consciousness: awake, sedated and patient cooperative  Airway & Oxygen Therapy: Patient Spontanous Breathing and Patient connected to face mask oxygen  Post-op Assessment: Report given to RN and Post -op Vital signs reviewed and stable  Post vital signs: Reviewed and stable  Last Vitals:  Filed Vitals:   01/29/15 1125  BP:   Pulse: 73  Temp:   Resp: 20    Complications: No apparent anesthesia complications

## 2015-01-29 NOTE — Addendum Note (Signed)
Addendum  created 01/29/15 1356 by Sheldon Silvanavid Monque Haggar, MD   Modules edited: Notes Section   Notes Section:  File: [id number]

## 2015-01-29 NOTE — H&P (Signed)
[name] is an 81 y.o. female.   Chief Complaint: R shoulder pain HPI: R shoulder pain x several mo, failed conservative treatment with multiple injections, activity modification and medications.  Past Medical History  Diagnosis Date  . Hypertension   . Hyperlipidemia   . Anxiety   . Depression   . Thyroid disease   . Anemia   . Type II or unspecified type diabetes mellitus with renal manifestations, not stated as uncontrolled     stage II  . History of toe surgery left  . GERD (gastroesophageal reflux disease)   . Wears glasses     Past Surgical History  Procedure Laterality Date  . Knee surgery    . Abdominal hysterectomy    . Tonsillectomy    . Appendectomy    . Cerebral aneurysm repair  09/2007    coil ablation  . Joint replacement  2012    left total knee  . Total knee revision  2012    left  . Total knee arthroplasty  2005    right  . Total knee revision  2007    right  . Hammer toe surgery  2008    osteotomy=left foot  . Ganglion cyst excision  2008    lt ring finger  . Cholecystectomy  2012    lap choli  . Colonoscopy      Family History  Problem Relation Age of Onset  . Cancer Mother     breast cancer  . Hypertension Mother   . Mental illness Mother     alz  . Cancer Father     colon cancer  . Hypertension Father   . Cancer Maternal Aunt     breast cancer with mets   Social History:  reports that she has never smoked. She has never used smokeless tobacco. She reports that she drinks alcohol. She reports that she does not use illicit drugs.  Allergies:  Allergies  Allergen Reactions  . Augmentin [Amoxicillin-Pot Clavulanate]     vaginitis  . Codeine     rash  . Hytrin [Terazosin]     incontinence   . Keflex [Cephalexin]     nausea  . Oxycodone     rash  . Procardia [Nifedipine]     edema  . Vasotec [Enalapril]     cough    Medications Prior to Admission  Medication Sig Dispense Refill  . aspirin 81 MG tablet Take 81 mg by  mouth daily.    [name] Kitchen atenolol (TENORMIN) 100 MG tablet Take 1 tablet by mouth  daily 90 tablet 1  . atorvastatin (LIPITOR) 80 MG tablet Take 1 tablet by mouth  daily for cholesterol 120 tablet 99  . Biotin 10 MG CAPS Take by mouth.    . cholecalciferol (VITAMIN D) 1000 UNITS tablet Take 1,000 Units by mouth daily.    . furosemide (LASIX) 40 MG tablet Take 1 tablet by mouth  daily 90 tablet 1  . HYDROcodone-acetaminophen (NORCO/VICODIN) 5-325 MG per tablet Take 1 tablet by mouth 2 (two) times daily between meals as needed.  0  . losartan (COZAAR) 100 MG tablet Take 1 tablet by mouth daily.  0  . mexiletine (MEXITIL) 150 MG capsule Take 150 mg by mouth daily.    . Multiple Vitamins-Minerals (MULTIVITAMIN WITH MINERALS) tablet Take 1 tablet by mouth daily.    [name] Kitchen omeprazole (PRILOSEC) 20 MG capsule Take 20 mg by mouth daily.    [name] Kitchen SYNTHROID 100 MCG tablet take 1 tablet  by mouth once daily 90 tablet 4  . traZODone (DESYREL) 100 MG tablet Take 2 tablets by mouth every evening.  9  . verapamil (CALAN-SR) 120 MG CR tablet Take 1 tablet by mouth at bedtime.     [name] Kitchen. losartan (COZAAR) 100 MG tablet take 1 tablet by mouth once daily for blood pressure 30 tablet 0    Results for orders placed or performed during the hospital encounter of 01/29/15 (from the past 48 hour(s))  Hemoglobin-hemacue, POC     Status: None   Collection Time: 01/29/15 10:48 AM  Result Value Ref Range   Hemoglobin 12.2 12.0 - 15.0 g/dL   No results found.  Review of Systems  All other systems reviewed and are negative.   Blood pressure 175/76, pulse 73, temperature 98.3 F (36.8 C), temperature source Oral, resp. rate 20, height 5' 2.5" (1.588 m), weight 69.854 kg (154 lb), SpO2 100 %. Physical Exam  Constitutional: She is oriented to person, place, and time. She appears well-developed and well-nourished.  HENT:  Head: Atraumatic.  Eyes: EOM are normal.  Cardiovascular: Intact distal pulses.   Respiratory: Effort normal.   Musculoskeletal:  R shoulder pain with ROM, TTP over AC and supra insertion.  Neurological: She is alert and oriented to person, place, and time.  Skin: Skin is warm and dry.  Psychiatric: She has a normal mood and affect.     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

## 2015-01-29 NOTE — Anesthesia Postprocedure Evaluation (Addendum)
  Anesthesia Post-op Note  Patient: [name]  Procedure(s) Performed: Procedure(s) with comments: RIGHT SHOULDER ARTHROSCOPY WITH DEBRIDEMENT ROTATOR CUFF TEAR, SUBACROMIAL DECOMPRESSION, DISTAL CLAVICAL RESECTION (Right) - Right shoulder arthroscopy debridement rotatoro cuff tear, subacromial decompression, distal clavical resection  Patient Location: PACU  Anesthesia Type: Gen with regional for post op pain   Level of Consciousness: awake, alert  and oriented  Airway and Oxygen Therapy: Patient Spontanous Breathing  Post-op Pain: none  Post-op Assessment: Post-op Vital signs reviewed  Post-op Vital Signs: Reviewed  Last Vitals:  Filed Vitals:   01/29/15 1345  BP: 168/85  Pulse: 79  Temp:   Resp: 14    Complications: No apparent anesthesia complications

## 2015-01-29 NOTE — Op Note (Signed)
Procedure(s):   Mat Carnelaine B Read female 81 y.o. 01/29/2015  Procedure(s) and Anesthesia Type: #1 right shoulder arthroscopic rotator cuff repair #2 right shoulder arthroscopic biceps tenotomy and debridement #3 right shoulder arthroscopic subacromial decompression #4 right shoulder arthroscopic distal clavicle excision  Surgeon(s) and Role:    * Jones BroomJustin Hosie Sharman, MD - Primary     Surgeon: Mable ParisHANDLER,Kamica Florance WILLIAM   Assistants: Damita Lackanielle Lalibert PA-C (Danielle was present and scrubbed throughout the procedure and was essential in positioning, assisting with the camera and instrumentation,, and closure)  Anesthesia: General endotracheal anesthesia with preoperative interscalene block given by the attending anesthesiologist    Procedure Detail  Estimated Blood Loss: Min         Drains: none  Blood Given: none         Specimens: none        Complications:  * No complications entered in OR log *         Disposition: PACU - hemodynamically stable.         Condition: stable    Procedure:   INDICATIONS FOR SURGERY: The patient is 81 y.o. female who has a long history of right shoulder pain which has failed extensive conservative management with multiple injections, exercises, medications. Pain is interfering with sleep and quality of life this point and she was ultimately indicated for surgical treatment to try and decrease pain and restore function.  OPERATIVE FINDINGS: Examination under anesthesia: No stiffness or instability  DESCRIPTION OF PROCEDURE: The patient was identified in preoperative  holding area where I personally marked the operative site after  verifying site, side, and procedure with the patient. An interscalene block was given by the attending anesthesiologist the holding area.  The patient was taken back to the operating room where general anesthesia was induced without complication and was placed in the beach-chair position with the back  elevated about  60 degrees and all extremities and head and neck carefully padded and  positioned.   The right upper extremity was then prepped and  draped in a standard sterile fashion. The appropriate time-out  procedure was carried out. The patient did receive IV antibiotics  within 30 minutes of incision.   A small posterior portal incision was made and the arthroscope was introduced into the joint. An anterior portal was then established above the subscapularis using needle localization. Small cannula was placed anteriorly. Diagnostic arthroscopy was then carried out with findings as described above.  She was noted to have extensive tearing of the proximal long head biceps tendon involving more than 50% of the thickness of the tendon. Therefore a large biter was used to release the origin off of the superior labrum and then it was debrided with shaver and ArthriCare. The humeral joint surfaces were intact. She was noted to have a moderate amount of synovitis in the joint which was gently debrided with the ArthriCare. The upper border the subscapularis was torn and retracted behind the glenoid. The lower subscapularis was intact. This was gently debrided. Supraspinatus was torn but not retracted. The undersurface was extensively debrided with a shaver.  The arthroscope was then introduced into the subacromial space a standard lateral portal was established with needle localization. The shaver was used through the lateral portal to perform extensive bursectomy. Coracoacromial ligament was examined and found to be significantly frayed indicating chronic impingement.  The bursal surface of the rotator cuff confirmed a small full-thickness tear anteriorly with no retraction. This was debrided with a shaver. Tissue quality was actually  very good. Bone quality. Excellent. Given the small size of the tear I did not feel that simple debridement alone would be sufficient to alleviate her symptoms and that given her tendon  quality and bone quality she would likely do well with a rotator cuff repair. Therefore the bur was used to debride down to bleeding bone on the tuberosity and the repair was carried out with a 4.75 swivel lock anchor in the medial row preloaded with a fiber tape. These were passed in a mattress configuration and then brought over to an additional 4.75 swivel lock anchor and the lateral row bringing the tendon down nicely over the prepared tuberosity. There is no undue tension on the repair.  The coracoacromial ligament was taken down off the anterior acromion with the ArthroCare exposing a moderate hooked anterior acromial spur. A high-speed bur was then used through the lateral portal to take down the anterior acromial spur from lateral to medial in a standard acromioplasty.  The acromioplasty was also viewed from the lateral portal and the bur was used as necessary to ensure that the acromion was completely flat from posterior to anterior.  The distal clavicle was exposed arthroscopically and the bur was used to take off the undersurface for approximately 8 mm from the lateral portal. The bur was then moved to an anterior portal position to complete the distal clavicle excision resecting about 8 mm of the distal clavicle and a smooth even fashion. This was viewed from anterior and lateral portals and felt to be complete.  The arthroscopic equipment was removed from the joint and the portals were closed with 3-0 nylon in an interrupted fashion. Sterile dressings were then applied including Xeroform 4 x 4's ABDs and tape. The patient was then allowed to awaken from general anesthesia, placed in a sling, transferred to the stretcher and taken to the recovery room in stable condition.   POSTOPERATIVE PLAN: The patient will be discharged home today and will followup in one week for suture removal and wound check.  She will follow the standard cuff protocol.

## 2015-01-30 ENCOUNTER — Encounter (HOSPITAL_BASED_OUTPATIENT_CLINIC_OR_DEPARTMENT_OTHER): Payer: Self-pay | Admitting: Orthopedic Surgery

## 2015-02-05 DIAGNOSIS — M19011 Primary osteoarthritis, right shoulder: Secondary | ICD-10-CM | POA: Diagnosis not present

## 2015-02-06 ENCOUNTER — Telehealth: Payer: Self-pay | Admitting: *Deleted

## 2015-02-06 MED ORDER — ALPRAZOLAM 0.5 MG PO TABS: ORAL_TABLET | ORAL | Status: DC

## 2015-02-06 NOTE — Telephone Encounter (Signed)
Patient had shoulder surgery last week.  She is not having pain but states she feels jittery inside.  OK to send in RX for Alorazolam per Dr Oneta RackMcKeown.

## 2015-02-07 ENCOUNTER — Ambulatory Visit (INDEPENDENT_AMBULATORY_CARE_PROVIDER_SITE_OTHER): Payer: Medicare Other | Admitting: Internal Medicine

## 2015-02-07 ENCOUNTER — Encounter: Payer: Self-pay | Admitting: Internal Medicine

## 2015-02-07 VITALS — BP 168/88 | HR 68 | Temp 97.9°F | Resp 16 | Ht 62.5 in | Wt 154.0 lb

## 2015-02-07 DIAGNOSIS — M25611 Stiffness of right shoulder, not elsewhere classified: Secondary | ICD-10-CM | POA: Diagnosis not present

## 2015-02-07 DIAGNOSIS — R0989 Other specified symptoms and signs involving the circulatory and respiratory systems: Secondary | ICD-10-CM

## 2015-02-07 DIAGNOSIS — I10 Essential (primary) hypertension: Secondary | ICD-10-CM

## 2015-02-07 DIAGNOSIS — M25511 Pain in right shoulder: Secondary | ICD-10-CM | POA: Diagnosis not present

## 2015-02-07 DIAGNOSIS — M75121 Complete rotator cuff tear or rupture of right shoulder, not specified as traumatic: Secondary | ICD-10-CM | POA: Diagnosis not present

## 2015-02-07 MED ORDER — CLONIDINE HCL 0.1 MG PO TABS: 0.1000 mg | ORAL_TABLET | Freq: Two times a day (BID) | ORAL | Status: DC

## 2015-02-07 NOTE — Progress Notes (Signed)
Subjective:    Patient ID: [name], female    DOB: Mar 16, 1933, 81 y.o.   MRN: [id number]  Hypertension Associated symptoms include headaches. Pertinent negatives include no chest pain or shortness of breath.   Patient presents to the office for evaluation of elevated blood pressure.  She woke up this morning and had some blurry vision and some grogginess.  She reports that she went to her PT for her shoulder surgery and they told her that she had very elevated blood pressures.  She reports that her blood pressure was 183/114, and 183/104.  She reports that she is really tired.  She did start taking xanax yesterday and slept very well.  She has been taking all her medications at home and she has not missed any doses of medication.  She does reports a mild headache around her eyes.  She denies chest pain and shortness of breath.  No urinary troubles.     Review of Systems  Constitutional: Positive for fatigue. Negative for fever and chills.  Eyes: Positive for visual disturbance. Negative for photophobia.  Respiratory: Negative for chest tightness and shortness of breath.   Cardiovascular: Negative for chest pain and leg swelling.  Gastrointestinal: Negative for nausea and vomiting.  Genitourinary: Negative for dysuria, urgency, frequency, decreased urine volume and difficulty urinating.  Neurological: Positive for headaches. Negative for dizziness, light-headedness and numbness.       Objective:   Physical Exam  Constitutional: She is oriented to person, place, and time. She appears well-developed and well-nourished. No distress.  HENT:  Head: Normocephalic and atraumatic.  Mouth/Throat: Oropharynx is clear and moist. No oropharyngeal exudate.  Eyes: Conjunctivae and EOM are normal. Pupils are equal, round, and reactive to light. No scleral icterus.  Neck: Normal range of motion. Neck supple. No JVD present. No thyromegaly present.  Cardiovascular: Normal rate, regular rhythm and  intact distal pulses.  Exam reveals no gallop and no friction rub.   Murmur heard. Pulmonary/Chest: Effort normal and breath sounds normal. No respiratory distress. She has no wheezes. She has no rales. She exhibits no tenderness.  Musculoskeletal: Normal range of motion.  Right arm in a sling from prior ortho surgery  Lymphadenopathy:    She has no cervical adenopathy.  Neurological: She is alert and oriented to person, place, and time. She has normal strength. No cranial nerve deficit or sensory deficit. Coordination and gait normal.  Skin: Skin is warm and dry. She is not diaphoretic.  Psychiatric: She has a normal mood and affect. Her behavior is normal. Judgment and thought content normal.  Nursing note and vitals reviewed.   Filed Vitals:   02/07/15 1349  BP: 168/88  Pulse: 68  Temp: 97.9 F (36.6 C)  Resp: 16          Assessment & Plan:    1. Labile hypertension Elevated BP today.  No evidence of chest pain, SOB, decreased urine output.  Patient did have minor headache and visual changes earlier which have resolved.  BP may be reacting to pain from PT.  But may also be having labile HTN too.  Will try clonidine 0.1 mg BID.  Patient to go to the ER for weakness, chest pain, or SOB.  Postural Hypotension precautions discussed.  Patient to monitor BP at home to see how she does with medication.  Will see back in 1 week or sooner if problems.  Do not feel that labs or imaging required at this time.

## 2015-02-07 NOTE — Patient Instructions (Signed)
Clonidine tablets What is this medicine? CLONIDINE (KLOE ni deen) is used to treat high blood pressure. This medicine may be used for other purposes; ask your health care provider or pharmacist if you have questions. COMMON BRAND NAME(S): Catapres What should I tell my health care provider before I take this medicine? They need to know if you have any of these conditions: -kidney disease -an unusual or allergic reaction to clonidine, other medicines, foods, dyes, or preservatives -pregnant or trying to get pregnant -breast-feeding How should I use this medicine? Take this medicine by mouth with a glass of water. Follow the directions on the prescription label. Take your doses at regular intervals. Do not take your medicine more often than directed. Do not suddenly stop taking this medicine. You must gradually reduce the dose or you may get a dangerous increase in blood pressure. Ask your doctor or health care professional for advice. Talk to your pediatrician regarding the use of this medicine in children. Special care may be needed. Overdosage: If you think you have taken too much of this medicine contact a poison control center or emergency room at once. NOTE: This medicine is only for you. Do not share this medicine with others. What if I miss a dose? If you miss a dose, take it as soon as you can. If it is almost time for your next dose, take only that dose. Do not take double or extra doses. What may interact with this medicine? Do not take this medicine with any of the following medications: -MAOIs like Carbex, Eldepryl, Marplan, Nardil, and Parnate This medicine may also interact with the following medications: -barbiturate medicines for inducing sleep or treating seizures like phenobarbital -certain medicines for blood pressure, heart disease, irregular heart beat -certain medicines for depression, anxiety, or psychotic disturbances -prescription pain medicines This list may not  describe all possible interactions. Give your health care provider a list of all the medicines, herbs, non-prescription drugs, or dietary supplements you use. Also tell them if you smoke, drink alcohol, or use illegal drugs. Some items may interact with your medicine. What should I watch for while using this medicine? Visit your doctor or health care professional for regular checks on your progress. Check your heart rate and blood pressure regularly while you are taking this medicine. Ask your doctor or health care professional what your heart rate should be and when you should contact him or her. You may get drowsy or dizzy. Do not drive, use machinery, or do anything that needs mental alertness until you know how this medicine affects you. To avoid dizzy or fainting spells, do not stand or sit up quickly, especially if you are an older person. Alcohol can make you more drowsy and dizzy. Avoid alcoholic drinks. Your mouth may get dry. Chewing sugarless gum or sucking hard candy, and drinking plenty of water will help. Do not treat yourself for coughs, colds, or pain while you are taking this medicine without asking your doctor or health care professional for advice. Some ingredients may increase your blood pressure. If you are going to have surgery tell your doctor or health care professional that you are taking this medicine. What side effects may I notice from receiving this medicine? Side effects that you should report to your doctor or health care professional as soon as possible: -allergic reactions like skin rash, itching or hives, swelling of the face, lips, or tongue -anxiety, nervousness -chest pain -depression -fast, irregular heartbeat -swelling of feet or legs -unusually   weak or tired Side effects that usually do not require medical attention (report to your doctor or health care professional if they continue or are bothersome): -change in sex drive or  performance -constipation -headache This list may not describe all possible side effects. Call your doctor for medical advice about side effects. You may report side effects to FDA at 1-800-FDA-1088. Where should I keep my medicine? Keep out of the reach of children. Store at room temperature between 15 and 30 degrees C (59 and 86 degrees F). Protect from light. Keep container tightly closed. Throw away any unused medicine after the expiration date. NOTE: This sheet is a summary. It may not cover all possible information. If you have questions about this medicine, talk to your doctor, pharmacist, or health care provider.  2015, [employer] Standard. (2011-04-23 13:01:28)  

## 2015-02-15 ENCOUNTER — Ambulatory Visit (INDEPENDENT_AMBULATORY_CARE_PROVIDER_SITE_OTHER): Payer: Medicare Other | Admitting: Internal Medicine

## 2015-02-15 ENCOUNTER — Encounter: Payer: Self-pay | Admitting: Internal Medicine

## 2015-02-15 VITALS — BP 110/62 | HR 70 | Temp 98.4°F | Resp 16 | Ht 62.5 in

## 2015-02-15 DIAGNOSIS — Z0001 Encounter for general adult medical examination with abnormal findings: Secondary | ICD-10-CM

## 2015-02-15 DIAGNOSIS — K2100 Gastro-esophageal reflux disease with esophagitis, without bleeding: Secondary | ICD-10-CM

## 2015-02-15 DIAGNOSIS — R6889 Other general symptoms and signs: Secondary | ICD-10-CM | POA: Diagnosis not present

## 2015-02-15 DIAGNOSIS — M1990 Unspecified osteoarthritis, unspecified site: Secondary | ICD-10-CM

## 2015-02-15 DIAGNOSIS — N183 Chronic kidney disease, stage 3 unspecified: Secondary | ICD-10-CM

## 2015-02-15 DIAGNOSIS — E1122 Type 2 diabetes mellitus with diabetic chronic kidney disease: Secondary | ICD-10-CM

## 2015-02-15 DIAGNOSIS — M75121 Complete rotator cuff tear or rupture of right shoulder, not specified as traumatic: Secondary | ICD-10-CM | POA: Diagnosis not present

## 2015-02-15 DIAGNOSIS — Z0000 Encounter for general adult medical examination without abnormal findings: Secondary | ICD-10-CM

## 2015-02-15 DIAGNOSIS — Z79899 Other long term (current) drug therapy: Secondary | ICD-10-CM

## 2015-02-15 DIAGNOSIS — M25511 Pain in right shoulder: Secondary | ICD-10-CM | POA: Diagnosis not present

## 2015-02-15 DIAGNOSIS — E782 Mixed hyperlipidemia: Secondary | ICD-10-CM

## 2015-02-15 DIAGNOSIS — I10 Essential (primary) hypertension: Secondary | ICD-10-CM | POA: Diagnosis not present

## 2015-02-15 DIAGNOSIS — M25611 Stiffness of right shoulder, not elsewhere classified: Secondary | ICD-10-CM | POA: Diagnosis not present

## 2015-02-15 DIAGNOSIS — Z9181 History of falling: Secondary | ICD-10-CM

## 2015-02-15 DIAGNOSIS — E559 Vitamin D deficiency, unspecified: Secondary | ICD-10-CM

## 2015-02-15 DIAGNOSIS — F329 Major depressive disorder, single episode, unspecified: Secondary | ICD-10-CM

## 2015-02-15 DIAGNOSIS — R011 Cardiac murmur, unspecified: Secondary | ICD-10-CM

## 2015-02-15 DIAGNOSIS — F32A Depression, unspecified: Secondary | ICD-10-CM

## 2015-02-15 DIAGNOSIS — E039 Hypothyroidism, unspecified: Secondary | ICD-10-CM

## 2015-02-15 DIAGNOSIS — F3341 Major depressive disorder, recurrent, in partial remission: Secondary | ICD-10-CM | POA: Insufficient documentation

## 2015-02-15 NOTE — Progress Notes (Signed)
Patient ID: [name], female   DOB: 08-06-1933, 81 y.o.   MRN: [id number]    [facility] VISIT AND CPE  Assessment:     1. Depression, controlled -cont meds -go to ER for suicidal ideations or intentions of harming others  2. Essential hypertension -cont meds -dash diet -exercise -go to ER for chest pain, SOB, or focal weakness  3. GERD -foods to avoid discussed  4. Hypothyroidism, unspecified hypothyroidism type -cont meds -TSH checks q63mo  5. Osteoarthritis, unspecified osteoarthritis type, unspecified site -cont meds  6. CKD stage 3 due to type 2 diabetes mellitus -cont meds -diet and exercise -regularly check A1C and insulin levels  7. Mixed hyperlipidemia -cont meds -regularly check lipid panel  8. Vitamin D deficiency -cont meds -monitor levels  9. Medication management -CBC, Mg, Bmet, and LFTs  10. Murmur -cont to monitor -refer to cardiology for any changes    Plan:   During the course of the visit the patient was educated and counseled about appropriate screening and preventive services including:    Pneumococcal vaccine   Influenza vaccine  Td vaccine  Screening electrocardiogram  Bone densitometry screening  Colorectal cancer screening  Diabetes screening  Glaucoma screening  Nutrition counseling   Advanced directives: requested  Screening recommendations, referrals: Vaccinations:  Immunization History  Administered Date(s) Administered  . Influenza, High Dose Seasonal PF 09/05/2014  . Influenza-Unspecified 09/10/2013  . Pneumococcal-Unspecified 11/26/2012  . Td 11/26/2012  . Tdap 08/24/2014   Shingles vaccine up to date.     Nutrition assessed and recommended  Colonoscopy not indicated Recommended yearly ophthalmology/optometry visit for glaucoma screening and checkup Recommended yearly dental visit for hygiene and checkup Advanced directives - not indicated  Conditions/risks identified: BMI:  Discussed weight loss, diet, and increase physical activity.  Increase physical activity: AHA recommends 150 minutes of physical activity a week.  Medications reviewed Diabetes is at goal, ACE/ARB therapy: Yes. Urinary Incontinence is not an issue: discussed non pharmacology and pharmacology options.  Fall risk: low- discussed PT, home fall assessment, medications.   Subjective:    [name] is a 81 y.o.  female who presents for Medicare Annual Wellness Visit and complete physical.  Date of last medicare wellness visit is unknown.  She has had elevated blood pressure. Her blood pressure has been controlled at home, & today their BP is BP: 110/62 mmHg She does workout. She denies chest pain, shortness of breath, dizziness.  She is on cholesterol medication and denies myalgias. Her cholesterol is at goal. The cholesterol last visit was:  Lab Results  Component Value Date   CHOL 121 09/21/2014   HDL 58 09/21/2014   LDLCALC 47 09/21/2014   TRIG 81 09/21/2014   CHOLHDL 2.1 09/21/2014    She has had diabetes for sometime. She has been working on diet and exercise for diabetes, and denies foot ulcerations, hyperglycemia, hypoglycemia , increased appetite, nausea, paresthesia of the feet, polydipsia, polyuria, visual disturbances, vomiting and weight loss. Last A1C in the office was:  Lab Results  Component Value Date   HGBA1C 6.8* 09/21/2014    Patient is on Vitamin D supplement.   Lab Results  Component Value Date   VD25OH 47 06/14/2014     Patient returns to the office today for a recheck of BP.  Last week it was elevated in the 190/100.  She was given clonidine and is back this week.  Her BP has come back down.  She reports that she is  doing well other than some anxiety and fatigue.  She reports that it has been very difficult not being able to do things for herself.  She reports that she needs help driving to places.  She does report that her appetite has decreased and she is also  more fatigued.  She reports that her depression might be getting worse.     Names of Other Physician/Practitioners you currently use: 1. [facility] here for primary care 2. [facility], eye doctor, last visit 2015 3. Dr. Olegario Shearer, dentist, last visit 2015  Patient Care Team: [name], MD as PCP - General (Internal Medicine)  Medication Review: Current Outpatient Prescriptions on File Prior to Visit  Medication Sig Dispense Refill  . ALPRAZolam (XANAX) 0.5 MG tablet Take 1/2 to 1 tablet tid prn for anxiety. 30 tablet 0  . aspirin 81 MG tablet Take 81 mg by mouth daily.    [name] Kitchen atenolol (TENORMIN) 100 MG tablet Take 1 tablet by mouth  daily 90 tablet 1  . atorvastatin (LIPITOR) 80 MG tablet Take 1 tablet by mouth  daily for cholesterol 120 tablet 99  . Biotin 10 MG CAPS Take by mouth.    . cholecalciferol (VITAMIN D) 1000 UNITS tablet Take 1,000 Units by mouth daily.    . cloNIDine (CATAPRES) 0.1 MG tablet Take 1 tablet (0.1 mg total) by mouth 2 (two) times daily. 60 tablet 0  . docusate sodium (COLACE) 100 MG capsule Take 1 capsule (100 mg total) by mouth 3 (three) times daily as needed. 20 capsule 0  . furosemide (LASIX) 40 MG tablet Take 1 tablet by mouth  daily 90 tablet 1  . losartan (COZAAR) 100 MG tablet Take 1 tablet by mouth daily.  0  . mexiletine (MEXITIL) 150 MG capsule Take 150 mg by mouth daily.    . Multiple Vitamins-Minerals (MULTIVITAMIN WITH MINERALS) tablet Take 1 tablet by mouth daily.    [name] Kitchen omeprazole (PRILOSEC) 20 MG capsule Take 20 mg by mouth daily.    [name] Kitchen SYNTHROID 100 MCG tablet take 1 tablet by mouth once daily 90 tablet 4  . verapamil (CALAN-SR) 120 MG CR tablet Take 1 tablet by mouth at bedtime.      No current facility-administered medications on file prior to visit.    Current Problems (verified) Patient Active Problem List   Diagnosis Date Noted  . Murmur 01/17/2015  . Vitamin D deficiency 06/14/2014   . Medication management 06/14/2014  . CKD stage 3 due to type 2 diabetes mellitus 06/14/2014  . Hypothyroidism 12/02/2013  . GERD 12/02/2013  . Essential hypertension 12/02/2013  . DJD 12/02/2013  . Mixed hyperlipidemia 12/02/2013    Screening Tests Health Maintenance  Topic Date Due  . ZOSTAVAX  11/21/1994  . PNA vac Low Risk Adult (2 of 2 - PCV13) 11/26/2013  . OPHTHALMOLOGY EXAM  10/26/2014  . URINE MICROALBUMIN  12/02/2014  . FOOT EXAM  03/09/2015  . HEMOGLOBIN A1C  03/22/2015  . INFLUENZA VACCINE  06/11/2015  . COLONOSCOPY  04/12/2020  . TETANUS/TDAP  08/24/2024  . DEXA SCAN  Completed    Immunization History  Administered Date(s) Administered  . Influenza, High Dose Seasonal PF 09/05/2014  . Influenza-Unspecified 09/10/2013  . Pneumococcal-Unspecified 11/26/2012  . Td 11/26/2012  . Tdap 08/24/2014    Preventative care: Last colonoscopy: 2011  History reviewed: allergies, current medications, past family history, past medical history, past social history, past surgical history and problem list  Risk Factors: Tobacco  History  Substance Use Topics  . Smoking status: Never Smoker   . Smokeless tobacco: Never Used  . Alcohol Use: Yes     Comment: rare   She does not smoke.  Patient is not a former smoker. Are there smokers in your home (other than you)?  No Alcohol Current alcohol use: social drinker  Caffeine Current caffeine use: coffee 2 /day and tea 1 /day  Exercise Current exercise: walking  Nutrition/Diet Current diet: in general, a "healthy" diet    Cardiac risk factors: advanced age (older than 8455 for men, 6365 for women), diabetes mellitus, dyslipidemia, hypertension and sedentary lifestyle.  Depression Screen (Note: if answer to either of the following is "Yes", a more complete depression screening is indicated)   Q1: Over the past two weeks, have you felt down, depressed or hopeless? No  Q2: Over the past two weeks, have you felt little  interest or pleasure in doing things? No  Have you lost interest or pleasure in daily life? No  Do you often feel hopeless? No  Do you cry easily over simple problems? No  Activities of Daily Living In your present state of health, do you have any difficulty performing the following activities?:  Driving? No Managing money?  No Feeding yourself? No Getting from bed to chair? No Climbing a flight of stairs? No Preparing food and eating?: No Bathing or showering? No Getting dressed: No Getting to the toilet? No Using the toilet:No Moving around from place to place: No In the past year have you fallen or had a near fall?:No   Are you sexually active?  No  Do you have more than one partner?  No  Vision Difficulties: No  Hearing Difficulties: No Do you often ask people to speak up or repeat themselves? No Do you experience ringing or noises in your ears? No Do you have difficulty understanding soft or whispered voices? Sometimes.  Cognition  Do you feel that you have a problem with memory?No  Do you often misplace items? No  Do you feel safe at home?  Yes  Advanced directives Does patient have a Health Care Power of Attorney? Yes Does patient have a Living Will? Yes  Past Medical History  Diagnosis Date  . Hypertension   . Hyperlipidemia   . Anxiety   . Depression   . Thyroid disease   . Anemia   . Type II or unspecified type diabetes mellitus with renal manifestations, not stated as uncontrolled     stage II  . History of toe surgery left  . GERD (gastroesophageal reflux disease)   . Wears glasses    Past Surgical History  Procedure Laterality Date  . Knee surgery    . Abdominal hysterectomy    . Tonsillectomy    . Appendectomy    . Cerebral aneurysm repair  09/2007    coil ablation  . Joint replacement  2012    left total knee  . Total knee revision  2012    left  . Total knee arthroplasty  2005    right  . Total knee revision  2007    right  .  Hammer toe surgery  2008    osteotomy=left foot  . Ganglion cyst excision  2008    lt ring finger  . Cholecystectomy  2012    lap choli  . Colonoscopy    . Shoulder arthroscopy with rotator cuff repair and subacromial decompression Right 01/29/2015    Procedure: RIGHT SHOULDER ARTHROSCOPY  WITH DEBRIDEMENT ROTATOR CUFF TEAR, SUBACROMIAL DECOMPRESSION, DISTAL CLAVICAL RESECTION;  Surgeon: Jones Broom, MD;  Location: [facility];  Service: Orthopedics;  Laterality: Right;  Right shoulder arthroscopy debridement rotatoro cuff tear, subacromial decompression, distal clavical resection   Review of Systems  Constitutional: Negative for fever, chills and malaise/fatigue.  HENT: Negative for congestion, ear discharge, ear pain, sore throat and tinnitus.   Eyes: Negative.   Respiratory: Negative for cough, sputum production, shortness of breath and wheezing.   Cardiovascular: Negative for chest pain, palpitations and leg swelling.  Gastrointestinal: Negative for heartburn, nausea, vomiting, abdominal pain, diarrhea, constipation, blood in stool and melena.  Genitourinary: Negative for dysuria, urgency, frequency and hematuria.  Musculoskeletal: Negative.   Neurological: Negative for weakness and headaches.  Psychiatric/Behavioral: Negative for suicidal ideas, memory loss and substance abuse. The patient is nervous/anxious. The patient does not have insomnia.     Objective:     BP 110/62 mmHg  Pulse 70  Temp(Src) 98.4 F (36.9 C) (Temporal)  Resp 16  Ht 5' 2.5" (1.588 m)  General Appearance: Well nourished, alert, WD/WN, female and in no apparent distress. Eyes: PERRLA, EOMs, conjunctiva no swelling or erythema, normal fundi and vessels. Sinuses: No frontal/maxillary tenderness ENT/Mouth: EACs patent / TMs  nl. Nares clear without erythema, swelling, mucoid exudates. Oral hygiene is good. No erythema, swelling, or exudate. Tongue normal, non-obstructing. Tonsils not swollen  or erythematous. Hearing normal.  Neck: Supple, thyroid normal. No bruits, nodes or JVD. Respiratory: Respiratory effort normal.  BS equal and clear bilateral without rales, rhonci, wheezing or stridor. Cardio: Heart sounds are normal with regular rate and rhythm and there is a murmur  Heard best in the second LICS,  No rubs or gallops. Peripheral pulses are normal and equal bilaterally without edema. No aortic or femoral bruits. Chest: symmetric with normal excursions and percussion. Abdomen: Flat, soft  with nl bowel sounds. Nontender, no guarding, rebound, hernias, masses, or organomegaly.  Lymphatics: Non tender without lymphadenopathy.  Musculoskeletal: Full ROM all peripheral extremities, joint stability, 5/5 strength, and normal gait.  Right arm in sling Skin: Warm and dry without rashes, lesions, cyanosis, clubbing or  ecchymosis.  Neuro: Cranial nerves intact, reflexes equal bilaterally. Normal muscle tone, no cerebellar symptoms. Sensation intact.  Pysch: Alert and oriented X 3, normal affect, Insight and Judgment appropriate.   Cognitive Testing  Alert? Yes  Normal Appearance?Yes  Oriented to person? Yes  Place? Yes   Time? Yes  Recall of three objects?  Yes  Can perform simple calculations? Yes  Displays appropriate judgment? Yes  Can read the correct time from a watch/clock?Yes  Medicare Attestation I have personally reviewed: The patient's medical and social history Their use of alcohol, tobacco or illicit drugs Their current medications and supplements The patient's functional ability including ADLs,fall risks, home safety risks, cognitive, and hearing and visual impairment Diet and physical activities Evidence for depression or mood disorders  The patient's weight, height, BMI, and visual acuity have been recorded in the chart.  I have made referrals, counseling, and provided education to the patient based on review of the above and I have provided the patient with a  written personalized care plan for preventive services.  Over 40 minutes of exam, counseling, chart review was performed.   [name], PA-C   02/15/2015

## 2015-02-21 DIAGNOSIS — M25611 Stiffness of right shoulder, not elsewhere classified: Secondary | ICD-10-CM | POA: Diagnosis not present

## 2015-02-21 DIAGNOSIS — M75121 Complete rotator cuff tear or rupture of right shoulder, not specified as traumatic: Secondary | ICD-10-CM | POA: Diagnosis not present

## 2015-02-21 DIAGNOSIS — M25511 Pain in right shoulder: Secondary | ICD-10-CM | POA: Diagnosis not present

## 2015-02-28 DIAGNOSIS — M25611 Stiffness of right shoulder, not elsewhere classified: Secondary | ICD-10-CM | POA: Diagnosis not present

## 2015-02-28 DIAGNOSIS — M25511 Pain in right shoulder: Secondary | ICD-10-CM | POA: Diagnosis not present

## 2015-02-28 DIAGNOSIS — M75121 Complete rotator cuff tear or rupture of right shoulder, not specified as traumatic: Secondary | ICD-10-CM | POA: Diagnosis not present

## 2015-02-28 DIAGNOSIS — Z9889 Other specified postprocedural states: Secondary | ICD-10-CM | POA: Diagnosis not present

## 2015-03-13 DIAGNOSIS — M75121 Complete rotator cuff tear or rupture of right shoulder, not specified as traumatic: Secondary | ICD-10-CM | POA: Diagnosis not present

## 2015-03-13 DIAGNOSIS — M25611 Stiffness of right shoulder, not elsewhere classified: Secondary | ICD-10-CM | POA: Diagnosis not present

## 2015-03-13 DIAGNOSIS — M25511 Pain in right shoulder: Secondary | ICD-10-CM | POA: Diagnosis not present

## 2015-03-15 ENCOUNTER — Other Ambulatory Visit: Payer: Self-pay | Admitting: Internal Medicine

## 2015-03-15 ENCOUNTER — Other Ambulatory Visit: Payer: Self-pay

## 2015-03-15 DIAGNOSIS — Z1231 Encounter for screening mammogram for malignant neoplasm of breast: Secondary | ICD-10-CM

## 2015-03-20 ENCOUNTER — Ambulatory Visit (INDEPENDENT_AMBULATORY_CARE_PROVIDER_SITE_OTHER): Payer: Medicare Other | Admitting: Internal Medicine

## 2015-03-20 ENCOUNTER — Encounter: Payer: Self-pay | Admitting: Internal Medicine

## 2015-03-20 VITALS — BP 136/84 | HR 64 | Temp 97.3°F | Resp 16 | Ht 62.5 in | Wt 153.6 lb

## 2015-03-20 DIAGNOSIS — E1122 Type 2 diabetes mellitus with diabetic chronic kidney disease: Secondary | ICD-10-CM

## 2015-03-20 DIAGNOSIS — E782 Mixed hyperlipidemia: Secondary | ICD-10-CM

## 2015-03-20 DIAGNOSIS — I10 Essential (primary) hypertension: Secondary | ICD-10-CM

## 2015-03-20 DIAGNOSIS — F329 Major depressive disorder, single episode, unspecified: Secondary | ICD-10-CM | POA: Diagnosis not present

## 2015-03-20 DIAGNOSIS — E559 Vitamin D deficiency, unspecified: Secondary | ICD-10-CM | POA: Diagnosis not present

## 2015-03-20 DIAGNOSIS — E039 Hypothyroidism, unspecified: Secondary | ICD-10-CM | POA: Diagnosis not present

## 2015-03-20 DIAGNOSIS — K2100 Gastro-esophageal reflux disease with esophagitis, without bleeding: Secondary | ICD-10-CM

## 2015-03-20 DIAGNOSIS — Z79899 Other long term (current) drug therapy: Secondary | ICD-10-CM | POA: Diagnosis not present

## 2015-03-20 DIAGNOSIS — N183 Chronic kidney disease, stage 3 unspecified: Secondary | ICD-10-CM

## 2015-03-20 DIAGNOSIS — Z9181 History of falling: Secondary | ICD-10-CM

## 2015-03-20 DIAGNOSIS — F32A Depression, unspecified: Secondary | ICD-10-CM

## 2015-03-20 DIAGNOSIS — M1990 Unspecified osteoarthritis, unspecified site: Secondary | ICD-10-CM

## 2015-03-20 DIAGNOSIS — Z1212 Encounter for screening for malignant neoplasm of rectum: Secondary | ICD-10-CM

## 2015-03-20 DIAGNOSIS — Z1331 Encounter for screening for depression: Secondary | ICD-10-CM

## 2015-03-20 DIAGNOSIS — E1129 Type 2 diabetes mellitus with other diabetic kidney complication: Secondary | ICD-10-CM | POA: Diagnosis not present

## 2015-03-20 LAB — BASIC METABOLIC PANEL WITH GFR
BUN: 19 mg/dL (ref 6–23)
CO2: 29 meq/L (ref 19–32)
Calcium: 9.4 mg/dL (ref 8.4–10.5)
Chloride: 102 mEq/L (ref 96–112)
Creat: 1.22 mg/dL — ABNORMAL HIGH (ref 0.50–1.10)
GFR, EST NON AFRICAN AMERICAN: 42 mL/min — AB
GFR, Est African American: 48 mL/min — ABNORMAL LOW
Glucose, Bld: 147 mg/dL — ABNORMAL HIGH (ref 70–99)
Potassium: 3.9 mEq/L (ref 3.5–5.3)
Sodium: 144 mEq/L (ref 135–145)

## 2015-03-20 LAB — TSH: TSH: 0.431 u[IU]/mL (ref 0.350–4.500)

## 2015-03-20 LAB — LIPID PANEL
CHOL/HDL RATIO: 2.7 ratio
Cholesterol: 144 mg/dL (ref 0–200)
HDL: 54 mg/dL (ref >=46)
LDL Cholesterol: 63 mg/dL (ref 0–99)
Triglycerides: 136 mg/dL (ref <150)
VLDL: 27 mg/dL (ref 0–40)

## 2015-03-20 LAB — CBC WITH DIFFERENTIAL/PLATELET
BASOS ABS: 0.1 10*3/uL (ref 0.0–0.1)
BASOS PCT: 1 % (ref 0–1)
Eosinophils Absolute: 0.3 10*3/uL (ref 0.0–0.7)
Eosinophils Relative: 4 % (ref 0–5)
HCT: 35.8 % — ABNORMAL LOW (ref 36.0–46.0)
Hemoglobin: 11.6 g/dL — ABNORMAL LOW (ref 12.0–15.0)
Lymphocytes Relative: 29 % (ref 12–46)
Lymphs Abs: 2.2 10*3/uL (ref 0.7–4.0)
MCH: 30.3 pg (ref 26.0–34.0)
MCHC: 32.4 g/dL (ref 30.0–36.0)
MCV: 93.5 fL (ref 78.0–100.0)
MONO ABS: 0.5 10*3/uL (ref 0.1–1.0)
MPV: 9.8 fL (ref 8.6–12.4)
Monocytes Relative: 7 % (ref 3–12)
NEUTROS PCT: 59 % (ref 43–77)
Neutro Abs: 4.5 10*3/uL (ref 1.7–7.7)
Platelets: 317 10*3/uL (ref 150–400)
RBC: 3.83 MIL/uL — ABNORMAL LOW (ref 3.87–5.11)
RDW: 13.5 % (ref 11.5–15.5)
WBC: 7.6 10*3/uL (ref 4.0–10.5)

## 2015-03-20 LAB — HEPATIC FUNCTION PANEL
ALK PHOS: 64 U/L (ref 39–117)
ALT: 17 U/L (ref 0–35)
AST: 21 U/L (ref 0–37)
Albumin: 4 g/dL (ref 3.5–5.2)
BILIRUBIN DIRECT: 0.1 mg/dL (ref 0.0–0.3)
BILIRUBIN TOTAL: 0.5 mg/dL (ref 0.2–1.2)
Indirect Bilirubin: 0.4 mg/dL (ref 0.2–1.2)
Total Protein: 6.5 g/dL (ref 6.0–8.3)

## 2015-03-20 LAB — MAGNESIUM: Magnesium: 1.8 mg/dL (ref 1.5–2.5)

## 2015-03-20 LAB — HEMOGLOBIN A1C
Hgb A1c MFr Bld: 6.9 % — ABNORMAL HIGH (ref <5.7)
MEAN PLASMA GLUCOSE: 151 mg/dL — AB (ref <117)

## 2015-03-20 NOTE — Patient Instructions (Signed)
+++++++++++++++++++  Recommend Low dose or baby Aspirin 81 mg daily   To reduce risk of Colon Cancer 20 %, Skin Cancer 26 % , Melanoma 46% and   Pancreatic cancer 60%  ++++++++++++++++++++ Vitamin D goal is between 70-100.   Please make sure that you are taking your Vitamin D as directed.   It is very important as a natural antiinflammatory   helping hair, skin, and nails, as well as reducing stroke and heart attack risk.   It helps your bones and helps with mood.  It also decreases numerous cancer risks so please take it as directed.   Low Vit D is associated with a 200-300% higher risk for CANCER   and 200-300% higher risk for HEART   ATTACK  &  STROKE.    ................................................  It is also associated with higher death rate at younger ages,   autoimmune diseases like Rheumatoid arthritis, Lupus, Multiple Sclerosis.     Also many other serious conditions, like depression, Alzheimer's  Dementia, infertility, muscle aches, fatigue, fibromyalgia - just to name a few.  +++++++++++++++ Recommend the book "The END of DIETING" by Dr Joel Fuhrman   & the book "The END of DIABETES " by Dr Joel Fuhrman  At [employer] - get book & Audio CD's     Being diabetic has a  300% increased risk for heart attack, stroke, cancer, and alzheimer- type vascular dementia. It is very important that you work harder with diet by avoiding all foods that are white. Avoid white rice (brown & wild rice is OK), white potatoes (sweetpotatoes in moderation is OK), White bread or wheat bread or anything made out of white flour like bagels, donuts, rolls, buns, biscuits, cakes, pastries, cookies, pizza crust, and pasta (made from white flour & egg whites) - vegetarian pasta or spinach or wheat pasta is OK. Multigrain breads like Arnold's or [employer], or multigrain sandwich thins or flatbreads.  Diet, exercise and weight loss can reverse and cure diabetes in the early stages.   Diet, exercise and weight loss is very important in the control and prevention of complications of diabetes which affects every system in your body, ie. Brain - dementia/stroke, eyes - glaucoma/blindness, heart - heart attack/heart failure, kidneys - dialysis, stomach - gastric paralysis, intestines - malabsorption, nerves - severe painful neuritis, circulation - gangrene & loss of a leg(s), and finally cancer and Alzheimers.    I recommend avoid fried & greasy foods,  sweets/candy, white rice (brown or wild rice or Quinoa is OK), white potatoes (sweet potatoes are OK) - anything made from white flour - bagels, doughnuts, rolls, buns, biscuits,white and wheat breads, pizza crust and traditional pasta made of white flour & egg white(vegetarian pasta or spinach or wheat pasta is OK).  Multi-grain bread is OK - like multi-grain flat bread or sandwich thins. Avoid alcohol in excess. Exercise is also important.    Eat all the vegetables you want - avoid meat, especially red meat and dairy - especially cheese.  Cheese is the most concentrated form of trans-fats which is the worst thing to clog up our arteries. Veggie cheese is OK which can be found in the fresh produce section at [employer] or [employer] or [employer]  ++++++++++++++++++++++++++++++++++ Preventive Care for Adults  A healthy lifestyle and preventive care can promote health and wellness. Preventive health guidelines for women include the following key practices.  A routine yearly physical is a good way to check with your health care provider about   your health and preventive screening. It is a chance to share any concerns and updates on your health and to receive a thorough exam.  Visit your dentist for a routine exam and preventive care every 6 months. Brush your teeth twice a day and floss once a day. Good oral hygiene prevents tooth decay and gum disease.  The frequency of eye exams is based on your age, health, family medical history,  use of contact lenses, and other factors. Follow your health care provider's recommendations for frequency of eye exams.  Eat a healthy diet. Foods like vegetables, fruits, whole grains, low-fat dairy products, and lean protein foods contain the nutrients you need without too many calories. Decrease your intake of foods high in solid fats, added sugars, and salt. Eat the right amount of calories for you.Get information about a proper diet from your health care provider, if necessary.  Regular physical exercise is one of the most important things you can do for your health. Most adults should get at least 150 minutes of moderate-intensity exercise (any activity that increases your heart rate and causes you to sweat) each week. In addition, most adults need muscle-strengthening exercises on 2 or more days a week.  Maintain a healthy weight. The body mass index (BMI) is a screening tool to identify possible weight problems. It provides an estimate of body fat based on height and weight. Your health care provider can find your BMI and can help you achieve or maintain a healthy weight.For adults 20 years and older:  A BMI below 18.5 is considered underweight.  A BMI of 18.5 to 24.9 is normal.  A BMI of 25 to 29.9 is considered overweight.  A BMI of 30 and above is considered obese.  Maintain normal blood lipids and cholesterol levels by exercising and minimizing your intake of saturated fat. Eat a balanced diet with plenty of fruit and vegetables. If your lipid or cholesterol levels are high, you are over 50, or you are at high risk for heart disease, you may need your cholesterol levels checked more frequently.Ongoing high lipid and cholesterol levels should be treated with medicines if diet and exercise are not working.  If you smoke, find out from your health care provider how to quit. If you do not use tobacco, do not start.  Lung cancer screening is recommended for adults aged 55-80 years who  are at high risk for developing lung cancer because of a history of smoking. A yearly low-dose CT scan of the lungs is recommended for people who have at least a 30-pack-year history of smoking and are a current smoker or have quit within the past 15 years. A pack year of smoking is smoking an average of 1 pack of cigarettes a day for 1 year (for example: 1 pack a day for 30 years or 2 packs a day for 15 years). Yearly screening should continue until the smoker has stopped smoking for at least 15 years. Yearly screening should be stopped for people who develop a health problem that would prevent them from having lung cancer treatment.  Avoid use of street drugs. Do not share needles with anyone. Ask for help if you need support or instructions about stopping the use of drugs.  High blood pressure causes heart disease and increases the risk of stroke.  Ongoing high blood pressure should be treated with medicines if weight loss and exercise do not work.  If you are 55-79 years old, ask your health care provider if   you should take aspirin to prevent strokes.  Diabetes screening involves taking a blood sample to check your fasting blood sugar level. This should be done once every 3 years, after age 45, if you are within normal weight and without risk factors for diabetes. Testing should be considered at a younger age or be carried out more frequently if you are overweight and have at least 1 risk factor for diabetes.  Breast cancer screening is essential preventive care for women. You should practice "breast self-awareness." This means understanding the normal appearance and feel of your breasts and may include breast self-examination. Any changes detected, no matter how small, should be reported to a health care provider. Women in their 20s and 30s should have a clinical breast exam (CBE) by a health care provider as part of a regular health exam every 1 to 3 years. After age 40, women should have a CBE every  year. Starting at age 40, women should consider having a mammogram (breast X-ray test) every year. Women who have a family history of breast cancer should talk to their health care provider about genetic screening. Women at a high risk of breast cancer should talk to their health care providers about having an MRI and a mammogram every year.  Breast cancer gene (BRCA)-related cancer risk assessment is recommended for women who have family members with BRCA-related cancers. BRCA-related cancers include breast, ovarian, tubal, and peritoneal cancers. Having family members with these cancers may be associated with an increased risk for harmful changes (mutations) in the breast cancer genes BRCA1 and BRCA2. Results of the assessment will determine the need for genetic counseling and BRCA1 and BRCA2 testing.  Routine pelvic exams to screen for cancer are no longer recommended for nonpregnant women who are considered low risk for cancer of the pelvic organs (ovaries, uterus, and vagina) and who do not have symptoms. Ask your health care provider if a screening pelvic exam is right for you.  If you have had past treatment for cervical cancer or a condition that could lead to cancer, you need Pap tests and screening for cancer for at least 20 years after your treatment. If Pap tests have been discontinued, your risk factors (such as having a new sexual partner) need to be reassessed to determine if screening should be resumed. Some women have medical problems that increase the chance of getting cervical cancer. In these cases, your health care provider may recommend more frequent screening and Pap tests.    Colorectal cancer can be detected and often prevented. Most routine colorectal cancer screening begins at the age of 50 years and continues through age 75 years. However, your health care provider may recommend screening at an earlier age if you have risk factors for colon cancer. On a yearly basis, your health  care provider may provide home test kits to check for hidden blood in the stool. Use of a small camera at the end of a tube, to directly examine the colon (sigmoidoscopy or colonoscopy), can detect the earliest forms of colorectal cancer. Talk to your health care provider about this at age 50, when routine screening begins. Direct exam of the colon should be repeated every 5-10 years through age 75 years, unless early forms of pre-cancerous polyps or small growths are found.  Osteoporosis is a disease in which the bones lose minerals and strength with aging. This can result in serious bone fractures or breaks. The risk of osteoporosis can be identified using a bone density scan.   Women ages 65 years and over and women at risk for fractures or osteoporosis should discuss screening with their health care providers. Ask your health care provider whether you should take a calcium supplement or vitamin D to reduce the rate of osteoporosis.  Menopause can be associated with physical symptoms and risks. Hormone replacement therapy is available to decrease symptoms and risks. You should talk to your health care provider about whether hormone replacement therapy is right for you.  Use sunscreen. Apply sunscreen liberally and repeatedly throughout the day. You should seek shade when your shadow is shorter than you. Protect yourself by wearing long sleeves, pants, a wide-brimmed hat, and sunglasses year round, whenever you are outdoors.  Once a month, do a whole body skin exam, using a mirror to look at the skin on your back. Tell your health care provider of new moles, moles that have irregular borders, moles that are larger than a pencil eraser, or moles that have changed in shape or color.  Stay current with required vaccines (immunizations).  Influenza vaccine. All adults should be immunized every year.  Tetanus, diphtheria, and acellular pertussis (Td, Tdap) vaccine. Pregnant women should receive 1 dose of  Tdap vaccine during each pregnancy. The dose should be obtained regardless of the length of time since the last dose. Immunization is preferred during the 27th-36th week of gestation. An adult who has not previously received Tdap or who does not know her vaccine status should receive 1 dose of Tdap. This initial dose should be followed by tetanus and diphtheria toxoids (Td) booster doses every 10 years. Adults with an unknown or incomplete history of completing a 3-dose immunization series with Td-containing vaccines should begin or complete a primary immunization series including a Tdap dose. Adults should receive a Td booster every 10 years.    Zoster vaccine. One dose is recommended for adults aged 60 years or older unless certain conditions are present.    Pneumococcal 13-valent conjugate (PCV13) vaccine. When indicated, a person who is uncertain of her immunization history and has no record of immunization should receive the PCV13 vaccine. An adult aged 19 years or older who has certain medical conditions and has not been previously immunized should receive 1 dose of PCV13 vaccine. This PCV13 should be followed with a dose of pneumococcal polysaccharide (PPSV23) vaccine. The PPSV23 vaccine dose should be obtained at least 8 weeks after the dose of PCV13 vaccine. An adult aged 19 years or older who has certain medical conditions and previously received 1 or more doses of PPSV23 vaccine should receive 1 dose of PCV13. The PCV13 vaccine dose should be obtained 1 or more years after the last PPSV23 vaccine dose.    Pneumococcal polysaccharide (PPSV23) vaccine. When PCV13 is also indicated, PCV13 should be obtained first. All adults aged 65 years and older should be immunized. An adult younger than age 65 years who has certain medical conditions should be immunized. Any person who resides in a nursing home or long-term care facility should be immunized. An adult smoker should be immunized. People with an  immunocompromised condition and certain other conditions should receive both PCV13 and PPSV23 vaccines. People with human immunodeficiency virus (HIV) infection should be immunized as soon as possible after diagnosis. Immunization during chemotherapy or radiation therapy should be avoided. Routine use of PPSV23 vaccine is not recommended for American Indians, [place] Natives, or people younger than 65 years unless there are medical conditions that require PPSV23 vaccine. When indicated, people who have unknown   immunization and have no record of immunization should receive PPSV23 vaccine. One-time revaccination 5 years after the first dose of PPSV23 is recommended for people aged 19-64 years who have chronic kidney failure, nephrotic syndrome, asplenia, or immunocompromised conditions. People who received 1-2 doses of PPSV23 before age 65 years should receive another dose of PPSV23 vaccine at age 65 years or later if at least 5 years have passed since the previous dose. Doses of PPSV23 are not needed for people immunized with PPSV23 at or after age 65 years.   Preventive Services / Frequency  Ages 65 years and over  Blood pressure check.  Lipid and cholesterol check.  Lung cancer screening. / Every year if you are aged 55-80 years and have a 30-pack-year history of smoking and currently smoke or have quit within the past 15 years. Yearly screening is stopped once you have quit smoking for at least 15 years or develop a health problem that would prevent you from having lung cancer treatment.  Clinical breast exam.** / Every year after age 40 years.  BRCA-related cancer risk assessment.** / For women who have family members with a BRCA-related cancer (breast, ovarian, tubal, or peritoneal cancers).  Mammogram.** / Every year beginning at age 40 years and continuing for as long as you are in good health. Consult with your health care provider.  Pap test.** / Every 3 years starting at age 30 years  through age 65 or 70 years with 3 consecutive normal Pap tests. Testing can be stopped between 65 and 70 years with 3 consecutive normal Pap tests and no abnormal Pap or HPV tests in the past 10 years.  Fecal occult blood test (FOBT) of stool. / Every year beginning at age 50 years and continuing until age 75 years. You may not need to do this test if you get a colonoscopy every 10 years.  Flexible sigmoidoscopy or colonoscopy.** / Every 5 years for a flexible sigmoidoscopy or every 10 years for a colonoscopy beginning at age 50 years and continuing until age 75 years.  Hepatitis C blood test.** / For all people born from 1945 through 1965 and any individual with known risks for hepatitis C.  Osteoporosis screening.** / A one-time screening for women ages 65 years and over and women at risk for fractures or osteoporosis.  Skin self-exam. / Monthly.  Influenza vaccine. / Every year.  Tetanus, diphtheria, and acellular pertussis (Tdap/Td) vaccine.** / 1 dose of Td every 10 years.  Zoster vaccine.** / 1 dose for adults aged 60 years or older.  Pneumococcal 13-valent conjugate (PCV13) vaccine.** / Consult your health care provider.  Pneumococcal polysaccharide (PPSV23) vaccine.** / 1 dose for all adults aged 65 years and older. Screening for abdominal aortic aneurysm (AAA)  by ultrasound is recommended for people who have history of high blood pressure or who are current or former smokers. 

## 2015-03-20 NOTE — Progress Notes (Signed)
Patient ID: [name], female   DOB: Jun 02, 1933, 81 y.o.   MRN: [id number]  Annual Comprehensive Examination  This very nice 81 y.o.female presents for complete physical.  Patient has been followed for HTN, Prediabetes, Hyperlipidemia, GERD, DJD, Hypothyroidism and Vitamin D Deficiency. Patient also has end stage DJD having undergone bilat. TKR and had recent Rt shoulder surg.     HTN predates since [id number]. Patient has CKD3 with GFR 49 ml/min. She also had recent cardiac evaluation with a negative cardiac echo before her shoulder surgery. Patient's BP has been controlled at home and patient denies any cardiac symptoms as chest pain, palpitations, shortness of breath, dizziness or ankle swelling. Today's BP: 136/84 mmHg   Patient's hyperlipidemia is controlled with diet and medications. Patient denies myalgias or other medication SE's. Last lipids were at goal - Total Chol 121; HDL 58; LDL 47; Trig 81 on 09/21/2014.   Patient has prediabetes predating since Aug 2011 with A1c 6.4% and patient denies reactive hypoglycemic symptoms, visual blurring, diabetic polys, or paresthesias. Last A1c was 6.8% on 09/21/2014.      Finally, patient has history of Vitamin D Deficiency of 24 in 2009and last Vitamin D was  47 on 06/14/2014.      Medication Sig  . ALPRAZolam 0.5 MG tablet Take 1/2 to 1 ta tid prn for anxiety.  [name] Kitchen. aspirin 81 MG tablet Take 81  daily.  [name] Kitchen. atenolol  100 MG tablet Take 1 tab  daily  . atorvastatin  80 MG tablet Take 1 tab  daily for cholesterol  . Biotin 10 MG CAPS Take by mouth.  [name] Kitchen. VITAMIN D 1000 UNITS  Take 1,000 Units  daily.  . cloNIDine 0.1 MG tablet Take 1 tab 2  times daily.  [name] Kitchen. COLACE 100 MG capsule Take 1 cap 3  times daily as needed.  . furosemide  40 MG tablet Take 1 tab  daily  . losartan100 MG tablet take 1 tab once daily for blood pressure  . mexiletine 150 MG capsule Take 150 mg daily.  . Multiple Vitamins-Minerals Take 1 tablet  daily.  [name] Kitchen. omeprazole20 MG capsule Take 20  mg  daily.  [name] Kitchen. SYNTHROID 100 MCG tablet take 1 tab once daily  . verapamil  120 MG CR Take 1 tab at bedtime.    Allergies  Allergen Reactions  . Augmentin [Amoxicillin-Pot Clavulanate]     vaginitis  . Codeine     rash  . Hytrin [Terazosin]     incontinence   . Keflex [Cephalexin]     nausea  . Oxycodone     rash  . Procardia [Nifedipine]     edema  . Vasotec [Enalapril]     cough   Past Medical History  Diagnosis Date  . Hypertension   . Hyperlipidemia   . Anxiety   . Depression   . Thyroid disease   . Anemia   . Type II or unspecified type diabetes mellitus with renal manifestations, not stated as uncontrolled     stage II  . History of toe surgery left  . GERD (gastroesophageal reflux disease)   . Wears glasses    Health Maintenance  Topic Date Due  . ZOSTAVAX  11/21/1994  . PNA vac Low Risk Adult (2 of 2 - PCV13) 11/26/2013  . OPHTHALMOLOGY EXAM  10/26/2014  . URINE MICROALBUMIN  12/02/2014  . FOOT EXAM  03/09/2015  . HEMOGLOBIN A1C  03/22/2015  . INFLUENZA VACCINE  06/11/2015  . COLONOSCOPY  04/12/2020  .  TETANUS/TDAP  08/24/2024  . DEXA SCAN  Completed   Immunization History  Administered Date(s) Administered  . Influenza, High Dose Seasonal PF 09/05/2014  . Influenza-Unspecified 09/10/2013  . Pneumococcal-Unspecified 11/26/2012  . Td 11/26/2012  . Tdap 08/24/2014   Past Surgical History  Procedure Laterality Date  . Knee surgery    . Abdominal hysterectomy    . Tonsillectomy    . Appendectomy    . Cerebral aneurysm repair  09/2007    coil ablation  . Joint replacement  2012    left total knee  . Total knee revision  2012    left  . Total knee arthroplasty  2005    right  . Total knee revision  2007    right  . Hammer toe surgery  2008    osteotomy=left foot  . Ganglion cyst excision  2008    lt ring finger  . Cholecystectomy  2012    lap choli  . Colonoscopy    . Shoulder arthroscopy with rotator cuff repair and subacromial  decompression Right 01/29/2015    Procedure: RIGHT SHOULDER ARTHROSCOPY WITH DEBRIDEMENT ROTATOR CUFF TEAR, SUBACROMIAL DECOMPRESSION, DISTAL CLAVICAL RESECTION;  Surgeon: Jones BroomJustin Chandler, MD;  Location: [facility];  Service: Orthopedics;  Laterality: Right;  Right shoulder arthroscopy debridement rotatoro cuff tear, subacromial decompression, distal clavical resection   Family History  Problem Relation Age of Onset  . Cancer Mother     breast cancer  . Hypertension Mother   . Mental illness Mother     alz  . Cancer Father     colon cancer  . Hypertension Father   . Cancer Maternal Aunt     breast cancer with mets   History  Substance Use Topics  . Smoking status: Never Smoker   . Smokeless tobacco: Never Used  . Alcohol Use: Yes     Comment: rare    ROS Constitutional: Denies fever, chills, weight loss/gain, headaches, insomnia,  night sweats, and change in appetite. Does c/o fatigue. Eyes: Denies redness, blurred vision, diplopia, discharge, itchy, watery eyes.  ENT: Denies discharge, congestion, post nasal drip, epistaxis, sore throat, earache, hearing loss, dental pain, Tinnitus, Vertigo, Sinus pain, snoring.  Cardio: Denies chest pain, palpitations, irregular heartbeat, syncope, dyspnea, diaphoresis, orthopnea, PND, claudication, edema Respiratory: denies cough, dyspnea, DOE, pleurisy, hoarseness, laryngitis, wheezing.  Gastrointestinal: Denies dysphagia, heartburn, reflux, water brash, pain, cramps, nausea, vomiting, bloating, diarrhea, constipation, hematemesis, melena, hematochezia, jaundice, hemorrhoids Genitourinary: Denies dysuria, frequency, urgency, nocturia, hesitancy, discharge, hematuria, flank pain Breast: Breast lumps, nipple discharge, bleeding.  Musculoskeletal: Denies arthralgia, myalgia, stiffness, Jt. Swelling, pain, limp, and strain/sprain. Denies falls. Skin: Denies puritis, rash, hives, warts, acne, eczema, changing in skin lesion Neuro: No  weakness, tremor, incoordination, spasms, paresthesia, pain Psychiatric: Denies confusion, memory loss, sensory loss. Denies Depression. Endocrine: Denies change in weight, skin, hair change, nocturia, and paresthesia, diabetic polys, visual blurring, hyper / hypo glycemic episodes.  Heme/Lymph: No excessive bleeding, bruising, enlarged lymph nodes.  Physical Exam  BP 136/84 Pulse 64  Temp 97.3 F   Resp 16  Ht 5' 2.5" (  Wt 153 lb 9.6 oz     BMI 27.63  General Appearance: Well nourished and in no apparent distress. Eyes: PERRLA, EOMs, conjunctiva no swelling or erythema, normal fundi and vessels. Sinuses: No frontal/maxillary tenderness ENT/Mouth: EACs patent / TMs  nl. Nares clear without erythema, swelling, mucoid exudates. Oral hygiene is good. No erythema, swelling, or exudate. Tongue normal, non-obstructing. Tonsils not swollen or  erythematous. Hearing normal.  Neck: Supple, thyroid normal. No bruits, nodes or JVD. Respiratory: Respiratory effort normal.  BS equal and clear bilateral without rales, rhonci, wheezing or stridor. Cardio: Heart sounds are normal with regular rate and rhythm with a gr 2/4 sys ejection type Murmer at the LSB. Peripheral pulses are normal and equal bilaterally without edema. No aortic or femoral bruits. Chest: symmetric with normal excursions and percussion. Breasts: Symmetric, without lumps, nipple discharge, retractions, or fibrocystic changes.  Abdomen: Flat, soft, with bowl sounds. Nontender, no guarding, rebound, hernias, masses, or organomegaly.  Lymphatics: Non tender without lymphadenopathy.   Musculoskeletal:Decreased ROM of the Rt shoulder, Normal gait. Generalized decrease in muscle power, tone and bulk consistent with age and deconditioning.  Skin: Warm and dry without rashes, lesions, cyanosis, clubbing or  ecchymosis.  Neuro: Cranial nerves intact, reflexes equal bilaterally. Normal muscle tone, no cerebellar symptoms. Sensation intact.   Pysch: Awake and oriented X 3, normal affect, Insight and Judgment appropriate.   Assessment and Plan  1. Essential hypertension  - [place], RETROPERITNL ABD,   2. Mixed hyperlipidemia  - Lipid panel  3. CKD stage 3 due to type 2 diabetes mellitus  - Microalbumin / creatinine urine ratio - Hemoglobin A1c - Insulin, random  4. Vitamin D deficiency  - Vit D  25 hydroxy   5. Hypothyroidism  - TSH  6. GERD   7. Depression, controlled   8. Osteoarthritis   9. Screening for rectal cancer  - POC Hemoccult Bld/Stl   10. Depression screen   11. Medication management  - Urine Microscopic - CBC with Differential/Platelet - BASIC METABOLIC PANEL WITH GFR - Hepatic function panel - Magnesium   Continue prudent diet as discussed, weight control, BP monitoring, regular exercise, and medications. Discussed med's effects and SE's. Screening labs and tests as requested with regular follow-up as recommended.  Over 40 minutes of exam, counseling, chart review was performed.

## 2015-03-21 DIAGNOSIS — M25511 Pain in right shoulder: Secondary | ICD-10-CM | POA: Diagnosis not present

## 2015-03-21 DIAGNOSIS — M75121 Complete rotator cuff tear or rupture of right shoulder, not specified as traumatic: Secondary | ICD-10-CM | POA: Diagnosis not present

## 2015-03-21 DIAGNOSIS — M25611 Stiffness of right shoulder, not elsewhere classified: Secondary | ICD-10-CM | POA: Diagnosis not present

## 2015-03-21 LAB — URINALYSIS, MICROSCOPIC ONLY
BACTERIA UA: NONE SEEN
Casts: NONE SEEN
Crystals: NONE SEEN
Squamous Epithelial / LPF: NONE SEEN

## 2015-03-21 LAB — MICROALBUMIN / CREATININE URINE RATIO
CREATININE, URINE: 152.3 mg/dL
MICROALB/CREAT RATIO: 15.1 mg/g (ref 0.0–30.0)
Microalb, Ur: 2.3 mg/dL — ABNORMAL HIGH (ref <2.0)

## 2015-03-21 LAB — INSULIN, RANDOM: INSULIN: 5.8 u[IU]/mL (ref 2.0–19.6)

## 2015-03-21 LAB — VITAMIN D 25 HYDROXY (VIT D DEFICIENCY, FRACTURES): VIT D 25 HYDROXY: 53 ng/mL (ref 30–100)

## 2015-03-26 DIAGNOSIS — M25611 Stiffness of right shoulder, not elsewhere classified: Secondary | ICD-10-CM | POA: Diagnosis not present

## 2015-03-26 DIAGNOSIS — M25511 Pain in right shoulder: Secondary | ICD-10-CM | POA: Diagnosis not present

## 2015-03-26 DIAGNOSIS — M75121 Complete rotator cuff tear or rupture of right shoulder, not specified as traumatic: Secondary | ICD-10-CM | POA: Diagnosis not present

## 2015-04-04 DIAGNOSIS — M25611 Stiffness of right shoulder, not elsewhere classified: Secondary | ICD-10-CM | POA: Diagnosis not present

## 2015-04-04 DIAGNOSIS — M75121 Complete rotator cuff tear or rupture of right shoulder, not specified as traumatic: Secondary | ICD-10-CM | POA: Diagnosis not present

## 2015-04-04 DIAGNOSIS — M25511 Pain in right shoulder: Secondary | ICD-10-CM | POA: Diagnosis not present

## 2015-04-05 ENCOUNTER — Other Ambulatory Visit: Payer: Self-pay | Admitting: Internal Medicine

## 2015-04-10 DIAGNOSIS — M25611 Stiffness of right shoulder, not elsewhere classified: Secondary | ICD-10-CM | POA: Diagnosis not present

## 2015-04-10 DIAGNOSIS — M75121 Complete rotator cuff tear or rupture of right shoulder, not specified as traumatic: Secondary | ICD-10-CM | POA: Diagnosis not present

## 2015-04-10 DIAGNOSIS — M25511 Pain in right shoulder: Secondary | ICD-10-CM | POA: Diagnosis not present

## 2015-04-17 DIAGNOSIS — M25511 Pain in right shoulder: Secondary | ICD-10-CM | POA: Diagnosis not present

## 2015-04-17 DIAGNOSIS — M25611 Stiffness of right shoulder, not elsewhere classified: Secondary | ICD-10-CM | POA: Diagnosis not present

## 2015-04-17 DIAGNOSIS — M75121 Complete rotator cuff tear or rupture of right shoulder, not specified as traumatic: Secondary | ICD-10-CM | POA: Diagnosis not present

## 2015-04-18 DIAGNOSIS — Z9889 Other specified postprocedural states: Secondary | ICD-10-CM | POA: Diagnosis not present

## 2015-04-20 ENCOUNTER — Ambulatory Visit
Admission: RE | Admit: 2015-04-20 | Discharge: 2015-04-20 | Disposition: A | Discharge status: Home / Self Care | Payer: Medicare Other | Source: Ambulatory Visit

## 2015-04-20 DIAGNOSIS — Z1231 Encounter for screening mammogram for malignant neoplasm of breast: Secondary | ICD-10-CM

## 2015-04-24 ENCOUNTER — Other Ambulatory Visit: Payer: Self-pay | Admitting: *Deleted

## 2015-04-24 DIAGNOSIS — M25511 Pain in right shoulder: Secondary | ICD-10-CM | POA: Diagnosis not present

## 2015-04-24 DIAGNOSIS — Z1212 Encounter for screening for malignant neoplasm of rectum: Secondary | ICD-10-CM

## 2015-04-24 DIAGNOSIS — M25611 Stiffness of right shoulder, not elsewhere classified: Secondary | ICD-10-CM | POA: Diagnosis not present

## 2015-04-24 DIAGNOSIS — M75121 Complete rotator cuff tear or rupture of right shoulder, not specified as traumatic: Secondary | ICD-10-CM | POA: Diagnosis not present

## 2015-04-24 LAB — POC HEMOCCULT BLD/STL (HOME/3-CARD/SCREEN)
FECAL OCCULT BLD: NEGATIVE
FECAL OCCULT BLD: NEGATIVE
Fecal Occult Blood, POC: NEGATIVE

## 2015-04-25 DIAGNOSIS — Z961 Presence of intraocular lens: Secondary | ICD-10-CM | POA: Diagnosis not present

## 2015-05-08 DIAGNOSIS — M75121 Complete rotator cuff tear or rupture of right shoulder, not specified as traumatic: Secondary | ICD-10-CM | POA: Diagnosis not present

## 2015-05-08 DIAGNOSIS — M25611 Stiffness of right shoulder, not elsewhere classified: Secondary | ICD-10-CM | POA: Diagnosis not present

## 2015-05-08 DIAGNOSIS — M25511 Pain in right shoulder: Secondary | ICD-10-CM | POA: Diagnosis not present

## 2015-05-30 DIAGNOSIS — G5601 Carpal tunnel syndrome, right upper limb: Secondary | ICD-10-CM | POA: Diagnosis not present

## 2015-05-30 DIAGNOSIS — Z09 Encounter for follow-up examination after completed treatment for conditions other than malignant neoplasm: Secondary | ICD-10-CM | POA: Diagnosis not present

## 2015-06-13 DIAGNOSIS — G5601 Carpal tunnel syndrome, right upper limb: Secondary | ICD-10-CM | POA: Diagnosis not present

## 2015-06-15 DIAGNOSIS — G5601 Carpal tunnel syndrome, right upper limb: Secondary | ICD-10-CM | POA: Diagnosis not present

## 2015-06-21 ENCOUNTER — Ambulatory Visit (INDEPENDENT_AMBULATORY_CARE_PROVIDER_SITE_OTHER): Payer: Medicare Other | Admitting: Internal Medicine

## 2015-06-21 ENCOUNTER — Encounter: Payer: Self-pay | Admitting: Internal Medicine

## 2015-06-21 VITALS — BP 136/70 | HR 60 | Temp 98.4°F | Resp 16 | Ht 62.5 in | Wt 160.0 lb

## 2015-06-21 DIAGNOSIS — R7309 Other abnormal glucose: Secondary | ICD-10-CM | POA: Diagnosis not present

## 2015-06-21 DIAGNOSIS — E039 Hypothyroidism, unspecified: Secondary | ICD-10-CM | POA: Diagnosis not present

## 2015-06-21 DIAGNOSIS — E559 Vitamin D deficiency, unspecified: Secondary | ICD-10-CM | POA: Diagnosis not present

## 2015-06-21 DIAGNOSIS — Z79899 Other long term (current) drug therapy: Secondary | ICD-10-CM

## 2015-06-21 DIAGNOSIS — I10 Essential (primary) hypertension: Secondary | ICD-10-CM | POA: Diagnosis not present

## 2015-06-21 DIAGNOSIS — E782 Mixed hyperlipidemia: Secondary | ICD-10-CM

## 2015-06-21 DIAGNOSIS — R7303 Prediabetes: Secondary | ICD-10-CM

## 2015-06-21 NOTE — Patient Instructions (Addendum)
Please start taking melatonin at night time. You can take 5 mg and if this does not work you can take up to 10 mg about 10 minutes before bedtime.

## 2015-06-21 NOTE — Progress Notes (Signed)
Patient ID: [name], female   DOB: August 09, 1933, 81 y.o.   MRN: [id number]  Assessment and Plan:  Hypertension:  -Continue medication,  -monitor blood pressure at home.  -Continue DASH diet.   -Reminder to go to the ER if any CP, SOB, nausea, dizziness, severe HA, changes vision/speech, left arm numbness and tingling, and jaw pain.  Cholesterol: -Continue diet and exercise.  -Check cholesterol.   Pre-diabetes: -Continue diet and exercise.  -Check A1C  Vitamin D Def: -check level -continue medications.   Carpal Tunnel -patient to have surgery next week -will have injection in left hand following surgery  Continue diet and meds as discussed. Further disposition pending results of labs.  HPI 81 y.o. female  presents for 3 month follow up with hypertension, hyperlipidemia, prediabetes and vitamin D.   Her blood pressure has been controlled at home, today their BP is BP: 136/70 mmHg.   She does workout. She denies chest pain, shortness of breath, dizziness.  She reports that she has been mowing her yard and has been doing some walking.     She is on cholesterol medication and denies myalgias. Her cholesterol is at goal. The cholesterol last visit was:   Lab Results  Component Value Date   CHOL 144 03/20/2015   HDL 54 03/20/2015   LDLCALC 63 03/20/2015   TRIG 136 03/20/2015   CHOLHDL 2.7 03/20/2015     She has been working on diet and exercise for prediabetes, and denies foot ulcerations, hyperglycemia, hypoglycemia , increased appetite, nausea, paresthesia of the feet, polydipsia, polyuria, visual disturbances, vomiting and weight loss. Last A1C in the office was:  Lab Results  Component Value Date   HGBA1C 6.9* 03/20/2015    Patient is on Vitamin D supplement.  Lab Results  Component Value Date   VD25OH 67 03/20/2015     She reports that she has been doing well but is having some surgery on her right carpal tunnel.  She is thinking about having the left hand done.    Current Medications:  Current Outpatient Prescriptions on File Prior to Visit  Medication Sig Dispense Refill  . aspirin 81 MG tablet Take 81 mg by mouth daily.    [name] Kitchen atenolol (TENORMIN) 100 MG tablet Take 1 tablet by mouth  daily 120 tablet 1  . atorvastatin (LIPITOR) 80 MG tablet Take 1 tablet by mouth  daily for cholesterol 120 tablet 99  . Biotin 10 MG CAPS Take by mouth.    . cholecalciferol (VITAMIN D) 1000 UNITS tablet Take 1,000 Units by mouth daily.    . citalopram (CELEXA) 40 MG tablet   0  . cloNIDine (CATAPRES) 0.1 MG tablet Take 1 tablet (0.1 mg total) by mouth 2 (two) times daily. 60 tablet 0  . docusate sodium (COLACE) 100 MG capsule Take 1 capsule (100 mg total) by mouth 3 (three) times daily as needed. 20 capsule 0  . furosemide (LASIX) 40 MG tablet Take 1 tablet by mouth  daily 120 tablet 1  . losartan (COZAAR) 100 MG tablet take 1 tablet by mouth once daily for blood pressure 30 tablet 3  . mexiletine (MEXITIL) 150 MG capsule Take 150 mg by mouth daily.    . Multiple Vitamins-Minerals (MULTIVITAMIN WITH MINERALS) tablet Take 1 tablet by mouth daily.    . naproxen (NAPROSYN) 500 MG tablet   0  . omeprazole (PRILOSEC) 20 MG capsule Take 20 mg by mouth daily.    [name] Kitchen SYNTHROID 100 MCG tablet take 1  tablet by mouth once daily 90 tablet 4   No current facility-administered medications on file prior to visit.    Medical History:  Past Medical History  Diagnosis Date  . Hypertension   . Hyperlipidemia   . Anxiety   . Depression   . Thyroid disease   . Anemia   . Type II or unspecified type diabetes mellitus with renal manifestations, not stated as uncontrolled     stage II  . History of toe surgery left  . GERD (gastroesophageal reflux disease)   . Wears glasses     Allergies:  Allergies  Allergen Reactions  . Augmentin [Amoxicillin-Pot Clavulanate]     vaginitis  . Codeine     rash  . Hytrin [Terazosin]     incontinence   . Keflex [Cephalexin]     nausea   . Oxycodone     rash  . Procardia [Nifedipine]     edema  . Vasotec [Enalapril]     cough     Review of Systems:  Review of Systems  Constitutional: Negative for fever, chills and malaise/fatigue.  HENT: Negative for congestion, ear pain and sore throat.   Respiratory: Negative for cough, shortness of breath and wheezing.   Cardiovascular: Negative for chest pain, palpitations, claudication and leg swelling.  Gastrointestinal: Negative for heartburn, diarrhea and constipation.  Genitourinary: Negative.   Skin: Negative.   Neurological: Negative for dizziness, sensory change, loss of consciousness and headaches.  Psychiatric/Behavioral: Negative for depression. The patient is not nervous/anxious and does not have insomnia.     Family history- Review and unchanged  Social history- Review and unchanged  Physical Exam: BP 136/70 mmHg  Pulse 60  Temp(Src) 98.4 F (36.9 C) (Temporal)  Resp 16  Ht 5' 2.5" (1.588 m)  Wt 160 lb (72.576 kg)  BMI 28.78 kg/m2 Wt Readings from Last 3 Encounters:  06/21/15 160 lb (72.576 kg)  03/20/15 153 lb 9.6 oz (69.673 kg)  02/07/15 154 lb (69.854 kg)    General Appearance: Well nourished well developed, in no apparent distress. Eyes: PERRLA, EOMs, conjunctiva no swelling or erythema ENT/Mouth: Ear canals normal without obstruction, swelling, erythma, discharge.  TMs normal bilaterally.  Oropharynx moist, clear, without exudate, or postoropharyngeal swelling. Neck: Supple, thyroid normal,no cervical adenopathy  Respiratory: Respiratory effort normal, Breath sounds clear A&P without rhonchi, wheeze, or rale.  No retractions, no accessory usage. Cardio: RRR with no RGs.  2/6 murmur. Brisk peripheral pulses without edema.  Abdomen: Soft, + BS,  Non tender, no guarding, rebound, hernias, masses. Musculoskeletal: Full ROM, 5/5 strength, Normal gait Skin: Warm, dry without rashes, lesions, ecchymosis.  Neuro: Awake and oriented X 3, Cranial nerves  intact. Normal muscle tone, no cerebellar symptoms. Psych: Normal affect, Insight and Judgment appropriate.    [name], PA-C 3:34 PM [facility]

## 2015-06-22 ENCOUNTER — Other Ambulatory Visit: Payer: Self-pay | Admitting: Internal Medicine

## 2015-06-22 LAB — CBC WITH DIFFERENTIAL/PLATELET
Basophils Absolute: 0.1 10*3/uL (ref 0.0–0.1)
Basophils Relative: 1 % (ref 0–1)
EOS PCT: 4 % (ref 0–5)
Eosinophils Absolute: 0.3 10*3/uL (ref 0.0–0.7)
HCT: 34.5 % — ABNORMAL LOW (ref 36.0–46.0)
HEMOGLOBIN: 11 g/dL — AB (ref 12.0–15.0)
LYMPHS ABS: 2.3 10*3/uL (ref 0.7–4.0)
Lymphocytes Relative: 29 % (ref 12–46)
MCH: 29.6 pg (ref 26.0–34.0)
MCHC: 31.9 g/dL (ref 30.0–36.0)
MCV: 92.7 fL (ref 78.0–100.0)
MPV: 9.8 fL (ref 8.6–12.4)
Monocytes Absolute: 0.7 10*3/uL (ref 0.1–1.0)
Monocytes Relative: 9 % (ref 3–12)
NEUTROS PCT: 57 % (ref 43–77)
Neutro Abs: 4.6 10*3/uL (ref 1.7–7.7)
PLATELETS: 276 10*3/uL (ref 150–400)
RBC: 3.72 MIL/uL — ABNORMAL LOW (ref 3.87–5.11)
RDW: 13.3 % (ref 11.5–15.5)
WBC: 8.1 10*3/uL (ref 4.0–10.5)

## 2015-06-22 LAB — BASIC METABOLIC PANEL WITH GFR
BUN: 22 mg/dL (ref 7–25)
CALCIUM: 9.1 mg/dL (ref 8.6–10.4)
CHLORIDE: 103 mmol/L (ref 98–110)
CO2: 32 mmol/L — AB (ref 20–31)
CREATININE: 1.35 mg/dL — AB (ref 0.60–0.88)
GFR, EST AFRICAN AMERICAN: 43 mL/min — AB (ref >=60)
GFR, Est Non African American: 37 mL/min — ABNORMAL LOW (ref >=60)
Glucose, Bld: 104 mg/dL — ABNORMAL HIGH (ref 65–99)
POTASSIUM: 4.5 mmol/L (ref 3.5–5.3)
Sodium: 142 mmol/L (ref 135–146)

## 2015-06-22 LAB — LIPID PANEL
CHOL/HDL RATIO: 2.8 ratio (ref <=5.0)
Cholesterol: 143 mg/dL (ref 125–200)
HDL: 51 mg/dL (ref >=46)
LDL Cholesterol: 48 mg/dL (ref <130)
TRIGLYCERIDES: 221 mg/dL — AB (ref <150)
VLDL: 44 mg/dL — AB (ref <30)

## 2015-06-22 LAB — HEPATIC FUNCTION PANEL
ALT: 12 U/L (ref 6–29)
AST: 19 U/L (ref 10–35)
Albumin: 4.2 g/dL (ref 3.6–5.1)
Alkaline Phosphatase: 74 U/L (ref 33–130)
Bilirubin, Direct: 0.1 mg/dL (ref <=0.2)
Indirect Bilirubin: 0.2 mg/dL (ref 0.2–1.2)
Total Bilirubin: 0.3 mg/dL (ref 0.2–1.2)
Total Protein: 6.3 g/dL (ref 6.1–8.1)

## 2015-06-22 LAB — HEMOGLOBIN A1C
Hgb A1c MFr Bld: 7.1 % — ABNORMAL HIGH (ref <5.7)
MEAN PLASMA GLUCOSE: 157 mg/dL — AB (ref <117)

## 2015-06-22 LAB — MAGNESIUM: Magnesium: 2.2 mg/dL (ref 1.5–2.5)

## 2015-06-22 LAB — INSULIN, RANDOM: INSULIN: 12 u[IU]/mL (ref 2.0–19.6)

## 2015-06-22 LAB — TSH: TSH: 0.46 u[IU]/mL (ref 0.350–4.500)

## 2015-06-22 LAB — VITAMIN D 25 HYDROXY (VIT D DEFICIENCY, FRACTURES): Vit D, 25-Hydroxy: 48 ng/mL (ref 30–100)

## 2015-06-22 MED ORDER — METFORMIN HCL ER 500 MG PO TB24: 500.0000 mg | ORAL_TABLET | Freq: Every day | ORAL | Status: DC

## 2015-06-26 DIAGNOSIS — G5601 Carpal tunnel syndrome, right upper limb: Secondary | ICD-10-CM | POA: Diagnosis not present

## 2015-07-06 ENCOUNTER — Telehealth: Payer: Self-pay | Admitting: Internal Medicine

## 2015-07-06 NOTE — Telephone Encounter (Signed)
Patient called office with mid abdominal pain and diarrhea x 1 day.  Patient recently started metformin on 06/22/15.  This started at the same time she started new medication.  Will stop metformin and work hard on diet and exercise.

## 2015-07-09 DIAGNOSIS — Z9889 Other specified postprocedural states: Secondary | ICD-10-CM | POA: Diagnosis not present

## 2015-07-12 ENCOUNTER — Other Ambulatory Visit: Payer: Self-pay | Admitting: Internal Medicine

## 2015-07-12 ENCOUNTER — Other Ambulatory Visit: Payer: Self-pay | Admitting: *Deleted

## 2015-07-12 MED ORDER — MEXILETINE HCL 150 MG PO CAPS: 150.0000 mg | ORAL_CAPSULE | Freq: Three times a day (TID) | ORAL | Status: DC

## 2015-07-13 DIAGNOSIS — Z9889 Other specified postprocedural states: Secondary | ICD-10-CM | POA: Diagnosis not present

## 2015-07-27 ENCOUNTER — Other Ambulatory Visit: Payer: Self-pay | Admitting: Internal Medicine

## 2015-07-27 DIAGNOSIS — Z9889 Other specified postprocedural states: Secondary | ICD-10-CM | POA: Diagnosis not present

## 2015-08-08 ENCOUNTER — Encounter: Payer: Self-pay | Admitting: Physician Assistant

## 2015-08-08 ENCOUNTER — Ambulatory Visit (INDEPENDENT_AMBULATORY_CARE_PROVIDER_SITE_OTHER): Payer: Medicare Other | Admitting: Physician Assistant

## 2015-08-08 VITALS — BP 130/70 | HR 64 | Temp 97.2°F | Resp 14 | Ht 62.5 in | Wt 157.0 lb

## 2015-08-08 DIAGNOSIS — T63461A Toxic effect of venom of wasps, accidental (unintentional), initial encounter: Secondary | ICD-10-CM | POA: Diagnosis not present

## 2015-08-08 DIAGNOSIS — L03114 Cellulitis of left upper limb: Secondary | ICD-10-CM

## 2015-08-08 MED ORDER — ONDANSETRON HCL 4 MG PO TABS: 4.0000 mg | ORAL_TABLET | Freq: Every day | ORAL | Status: DC | PRN

## 2015-08-08 MED ORDER — PREDNISONE 20 MG PO TABS: ORAL_TABLET | ORAL | Status: DC

## 2015-08-08 MED ORDER — SULFAMETHOXAZOLE-TRIMETHOPRIM 800-160 MG PO TABS: 1.0000 | ORAL_TABLET | Freq: Two times a day (BID) | ORAL | Status: DC

## 2015-08-08 MED ORDER — DEXAMETHASONE SODIUM PHOSPHATE 100 MG/10ML IJ SOLN
10.0000 mg | Freq: Once | INTRAMUSCULAR | Status: AC
  Administered 2015-08-08: 10 mg via INTRAMUSCULAR

## 2015-08-08 NOTE — Patient Instructions (Addendum)
Bee, Wasp, or Hornet Sting Your caregiver has diagnosed you as having an insect sting. An insect sting appears as a red lump in the skin that sometimes has a tiny hole in the center, or it may have a stinger in the center of the wound. The most common stings are from wasps, hornets and bees. Individuals have different reactions to insect stings.  A normal reaction may cause pain, swelling, and redness around the sting site.  A localized allergic reaction may cause swelling and redness that extends beyond the sting site.  A large local reaction may continue to develop over the next 12 to 36 hours.  On occasion, the reactions can be severe (anaphylactic reaction). An anaphylactic reaction may cause wheezing; difficulty breathing; chest pain; fainting; raised, itchy, red patches on the skin; a sick feeling to your stomach (nausea); vomiting; cramping; or diarrhea. If you have had an anaphylactic reaction to an insect sting in the past, you are more likely to have one again. HOME CARE INSTRUCTIONS   With bee stings, a small sac of poison is left in the wound. Brushing across this with something such as a credit card, or anything similar, will help remove this and decrease the amount of the reaction. This same procedure will not help a wasp sting as they do not leave behind a stinger and poison sac.  Apply a cold compress for 10 to 20 minutes every hour for 1 to 2 days, depending on severity, to reduce swelling and itching.  To lessen pain, a paste made of water and baking soda may be rubbed on the bite or sting and left on for 5 minutes.  To relieve itching and swelling, you may use take medication or apply medicated creams or lotions as directed.  Only take over-the-counter or prescription medicines for pain, discomfort, or fever as directed by your caregiver.  Wash the sting site daily with soap and water. Apply antibiotic ointment on the sting site as directed.  If you suffered a severe  reaction:  If you did not require hospitalization, an adult will need to stay with you for 24 hours in case the symptoms return.  You may need to wear a medical bracelet or necklace stating the allergy.  You and your family need to learn when and how to use an anaphylaxis kit or epinephrine injection.  If you have had a severe reaction before, always carry your anaphylaxis kit with you. SEEK MEDICAL CARE IF:   None of the above helps within 2 to 3 days.  The area becomes red, warm, tender, and swollen beyond the area of the bite or sting.  You have an oral temperature above 102 F (38.9 C). SEEK IMMEDIATE MEDICAL CARE IF:  You have symptoms of an allergic reaction which are:  Wheezing.  Difficulty breathing.  Chest pain.  Lightheadedness or fainting.  Itchy, raised, red patches on the skin.  Nausea, vomiting, cramping or diarrhea. ANY OF THESE SYMPTOMS MAY REPRESENT A SERIOUS PROBLEM THAT IS AN EMERGENCY. Do not wait to see if the symptoms will go away. Get medical help right away. Call your local emergency services (911 in [place] DO NOT drive yourself to the hospital. MAKE SURE YOU:   Understand these instructions.  Will watch your condition.  Will get help right away if you are not doing well or get worse. Document Released: 10/27/2005 Document Revised: 01/19/2012 Document Reviewed: 04/13/2010 Exit[employer] Patient Information 2015 [employer]. This information is not intended to replace advice given   to you by your health care provider. Make sure you discuss any questions you have with your health care provider. ° °Cellulitis °Cellulitis is an infection of the skin and the tissue beneath it. The infected area is usually red and tender. Cellulitis occurs most often in the arms and lower legs.  °CAUSES  °Cellulitis is caused by bacteria that enter the skin through cracks or cuts in the skin. The most common types of bacteria that cause cellulitis are staphylococci and  streptococci. °SIGNS AND SYMPTOMS  °· Redness and warmth. °· Swelling. °· Tenderness or pain. °· Fever. °DIAGNOSIS  °Your health care provider can usually determine what is wrong based on a physical exam. Blood tests may also be done. °TREATMENT  °Treatment usually involves taking an antibiotic medicine. °HOME CARE INSTRUCTIONS  °· Take your antibiotic medicine as directed by your health care provider. Finish the antibiotic even if you start to feel better. °· Keep the infected arm or leg elevated to reduce swelling. °· Apply a warm cloth to the affected area up to 4 times per day to relieve pain. °· Take medicines only as directed by your health care provider. °· Keep all follow-up visits as directed by your health care provider. °SEEK MEDICAL CARE IF:  °· You notice red streaks coming from the infected area. °· Your red area gets larger or turns dark in color. °· Your bone or joint underneath the infected area becomes painful after the skin has healed. °· Your infection returns in the same area or another area. °· You notice a swollen bump in the infected area. °· You develop new symptoms. °· You have a fever. °SEEK IMMEDIATE MEDICAL CARE IF:  °· You feel very sleepy. °· You develop vomiting or diarrhea. °· You have a general ill feeling (malaise) with muscle aches and pains. °MAKE SURE YOU:  °· Understand these instructions. °· Will watch your condition. °· Will get help right away if you are not doing well or get worse. °Document Released: 08/06/2005 Document Revised: 03/13/2014 Document Reviewed: 01/12/2012 °[employer]® Patient Information ©2015 [employer]. This information is not intended to replace advice given to you by your health care provider. Make sure you discuss any questions you have with your health care provider. ° °

## 2015-08-08 NOTE — Progress Notes (Signed)
Subjective:    Patient ID: [name], female    DOB: Apr 08, 1933, 81 y.o.   MRN: [id number]  HPI 81 y.o. WF with history of HTN, thyroid, DM 2 with CKD stage 3, chol presents with bee sting left arm. She was stung Monday by a wasp on her left arm, stung twice on thumb and mid forearm, had delayed swelling after a few hours, started to itch, swelling, with warmth. She states it has gotten progressively worse with redness moving up her left arm to her elbow. She has tried vinegar/baking soda, hydrocortisone, benadryl that has not helped. Has nausea and vomiting last night, no fever, chills, trouble swallowing, SOB.  She is off the metformin due to diarrhea and nausea.   Blood pressure 130/70, pulse 64, temperature 97.2 F (36.2 C), resp. rate 14, height 5' 2.5" (1.588 m), weight 157 lb (71.215 kg), SpO2 92 %.  Current Outpatient Prescriptions on File Prior to Visit  Medication Sig Dispense Refill  . aspirin 81 MG tablet Take 81 mg by mouth daily.    [name] Kitchen atenolol (TENORMIN) 100 MG tablet Take 1 tablet by mouth  daily 120 tablet 1  . atorvastatin (LIPITOR) 80 MG tablet Take 1 tablet by mouth  daily for cholesterol 120 tablet 99  . Biotin 10 MG CAPS Take by mouth.    . cholecalciferol (VITAMIN D) 1000 UNITS tablet Take 1,000 Units by mouth daily.    . citalopram (CELEXA) 40 MG tablet   0  . cloNIDine (CATAPRES) 0.1 MG tablet Take 1 tablet (0.1 mg total) by mouth 2 (two) times daily. 60 tablet 0  . docusate sodium (COLACE) 100 MG capsule Take 1 capsule (100 mg total) by mouth 3 (three) times daily as needed. 20 capsule 0  . furosemide (LASIX) 40 MG tablet Take 1 tablet by mouth  daily 120 tablet 1  . losartan (COZAAR) 100 MG tablet take 1 tablet by mouth once daily 30 tablet 3  . Magnesium 400 MG CAPS Take by mouth daily.    . metFORMIN (GLUCOPHAGE XR) 500 MG 24 hr tablet Take 1 tablet (500 mg total) by mouth daily with breakfast. 30 tablet 11  . mexiletine (MEXITIL) 150 MG capsule Take 1  capsule (150 mg total) by mouth 3 (three) times daily. 270 capsule 1  . Multiple Vitamins-Minerals (MULTIVITAMIN WITH MINERALS) tablet Take 1 tablet by mouth daily.    . naproxen (NAPROSYN) 500 MG tablet   0  . omeprazole (PRILOSEC) 20 MG capsule Take 20 mg by mouth daily.    [name] Kitchen SYNTHROID 100 MCG tablet take 1 tablet by mouth once daily 90 tablet 4  . traZODone (DESYREL) 100 MG tablet take 3 tablets by mouth at bedtime 270 tablet 1   No current facility-administered medications on file prior to visit.   Past Medical History  Diagnosis Date  . Hypertension   . Hyperlipidemia   . Anxiety   . Depression   . Thyroid disease   . Anemia   . Type II or unspecified type diabetes mellitus with renal manifestations, not stated as uncontrolled     stage II  . History of toe surgery left  . GERD (gastroesophageal reflux disease)   . Wears glasses    Review of Systems  Constitutional: Negative.  Negative for fever and chills.  HENT: Negative.   Respiratory: Negative.   Cardiovascular: Negative.   Gastrointestinal: Positive for nausea and vomiting. Negative for abdominal pain, diarrhea, constipation, blood in stool, abdominal distention, anal  bleeding and rectal pain.  Genitourinary: Negative.   Musculoskeletal: Negative.  Negative for arthralgias.  Skin: Positive for color change, rash and wound. Negative for pallor.  Neurological: Negative.  Negative for dizziness.       Objective:   Physical Exam  Constitutional: She appears well-developed and well-nourished. No distress.  Cardiovascular: Normal rate and regular rhythm.   Pulmonary/Chest: Effort normal and breath sounds normal. No respiratory distress.  Abdominal: Soft. Bowel sounds are normal. There is no tenderness. There is no rebound and no guarding.  Skin: Skin is warm. Rash noted. There is erythema.  Left arm with swelling from hand to elbow, with warmth, erythema with well demarcated border along forearm, no streaking, full ROM  of hand, good distal neurovascular exam.       Assessment & Plan:  1. Wasp sting, accidental or unintentional, initial encounter with cellulitis - dexamethasone (DECADRON) injection 10 mg; Inject 1 mL (10 mg total) into the muscle once. - predniSONE (DELTASONE) 20 MG tablet; 2 tablets daily for 3 days, 1 tablet daily for 4 days.  Dispense: 10 tablet; Refill: 0 - sulfamethoxazole-trimethoprim (BACTRIM DS,SEPTRA DS) 800-160 MG tablet; Take 1 tablet by mouth 2 (two) times daily.  Dispense: 10 tablet; Refill: 0

## 2015-08-31 DIAGNOSIS — M4806 Spinal stenosis, lumbar region: Secondary | ICD-10-CM | POA: Diagnosis not present

## 2015-08-31 DIAGNOSIS — M533 Sacrococcygeal disorders, not elsewhere classified: Secondary | ICD-10-CM | POA: Diagnosis not present

## 2015-09-04 DIAGNOSIS — M533 Sacrococcygeal disorders, not elsewhere classified: Secondary | ICD-10-CM | POA: Diagnosis not present

## 2015-09-24 DIAGNOSIS — M5416 Radiculopathy, lumbar region: Secondary | ICD-10-CM | POA: Diagnosis not present

## 2015-10-02 ENCOUNTER — Ambulatory Visit (INDEPENDENT_AMBULATORY_CARE_PROVIDER_SITE_OTHER): Payer: Medicare Other | Admitting: Internal Medicine

## 2015-10-02 ENCOUNTER — Encounter: Payer: Self-pay | Admitting: Internal Medicine

## 2015-10-02 VITALS — BP 122/66 | HR 60 | Temp 97.5°F | Resp 16 | Ht 62.5 in | Wt 157.2 lb

## 2015-10-02 DIAGNOSIS — K2100 Gastro-esophageal reflux disease with esophagitis, without bleeding: Secondary | ICD-10-CM

## 2015-10-02 DIAGNOSIS — N183 Chronic kidney disease, stage 3 (moderate): Secondary | ICD-10-CM

## 2015-10-02 DIAGNOSIS — Z79899 Other long term (current) drug therapy: Secondary | ICD-10-CM | POA: Diagnosis not present

## 2015-10-02 DIAGNOSIS — Z0000 Encounter for general adult medical examination without abnormal findings: Secondary | ICD-10-CM | POA: Insufficient documentation

## 2015-10-02 DIAGNOSIS — E782 Mixed hyperlipidemia: Secondary | ICD-10-CM | POA: Diagnosis not present

## 2015-10-02 DIAGNOSIS — Z23 Encounter for immunization: Secondary | ICD-10-CM

## 2015-10-02 DIAGNOSIS — E1129 Type 2 diabetes mellitus with other diabetic kidney complication: Secondary | ICD-10-CM | POA: Diagnosis not present

## 2015-10-02 DIAGNOSIS — E039 Hypothyroidism, unspecified: Secondary | ICD-10-CM

## 2015-10-02 DIAGNOSIS — E559 Vitamin D deficiency, unspecified: Secondary | ICD-10-CM | POA: Diagnosis not present

## 2015-10-02 DIAGNOSIS — I10 Essential (primary) hypertension: Secondary | ICD-10-CM | POA: Diagnosis not present

## 2015-10-02 DIAGNOSIS — E1122 Type 2 diabetes mellitus with diabetic chronic kidney disease: Secondary | ICD-10-CM

## 2015-10-02 DIAGNOSIS — Z6828 Body mass index (BMI) 28.0-28.9, adult: Secondary | ICD-10-CM

## 2015-10-02 DIAGNOSIS — Z6825 Body mass index (BMI) 25.0-25.9, adult: Secondary | ICD-10-CM | POA: Insufficient documentation

## 2015-10-02 LAB — CBC WITH DIFFERENTIAL/PLATELET
BASOS ABS: 0.1 10*3/uL (ref 0.0–0.1)
BASOS PCT: 1 % (ref 0–1)
EOS ABS: 0.4 10*3/uL (ref 0.0–0.7)
EOS PCT: 5 % (ref 0–5)
HCT: 33.3 % — ABNORMAL LOW (ref 36.0–46.0)
Hemoglobin: 11.1 g/dL — ABNORMAL LOW (ref 12.0–15.0)
LYMPHS ABS: 2.6 10*3/uL (ref 0.7–4.0)
Lymphocytes Relative: 34 % (ref 12–46)
MCH: 30.4 pg (ref 26.0–34.0)
MCHC: 33.3 g/dL (ref 30.0–36.0)
MCV: 91.2 fL (ref 78.0–100.0)
MPV: 9.2 fL (ref 8.6–12.4)
Monocytes Absolute: 0.8 10*3/uL (ref 0.1–1.0)
Monocytes Relative: 10 % (ref 3–12)
NEUTROS PCT: 50 % (ref 43–77)
Neutro Abs: 3.8 10*3/uL (ref 1.7–7.7)
PLATELETS: 294 10*3/uL (ref 150–400)
RBC: 3.65 MIL/uL — AB (ref 3.87–5.11)
RDW: 13.8 % (ref 11.5–15.5)
WBC: 7.6 10*3/uL (ref 4.0–10.5)

## 2015-10-02 LAB — LIPID PANEL
CHOL/HDL RATIO: 2.8 ratio (ref <=5.0)
Cholesterol: 127 mg/dL (ref 125–200)
HDL: 46 mg/dL (ref >=46)
LDL Cholesterol: 51 mg/dL (ref <130)
TRIGLYCERIDES: 152 mg/dL — AB (ref <150)
VLDL: 30 mg/dL (ref <30)

## 2015-10-02 LAB — BASIC METABOLIC PANEL WITH GFR
BUN: 13 mg/dL (ref 7–25)
CO2: 34 mmol/L — ABNORMAL HIGH (ref 20–31)
CREATININE: 1.18 mg/dL — AB (ref 0.60–0.88)
Calcium: 9.4 mg/dL (ref 8.6–10.4)
Chloride: 97 mmol/L — ABNORMAL LOW (ref 98–110)
GFR, EST AFRICAN AMERICAN: 50 mL/min — AB (ref >=60)
GFR, Est Non African American: 44 mL/min — ABNORMAL LOW (ref >=60)
Glucose, Bld: 83 mg/dL (ref 65–99)
POTASSIUM: 3.7 mmol/L (ref 3.5–5.3)
SODIUM: 142 mmol/L (ref 135–146)

## 2015-10-02 LAB — MAGNESIUM: MAGNESIUM: 2.3 mg/dL (ref 1.5–2.5)

## 2015-10-02 LAB — HEPATIC FUNCTION PANEL
ALBUMIN: 4 g/dL (ref 3.6–5.1)
ALK PHOS: 59 U/L (ref 33–130)
ALT: 13 U/L (ref 6–29)
AST: 22 U/L (ref 10–35)
BILIRUBIN DIRECT: 0.1 mg/dL (ref <=0.2)
BILIRUBIN TOTAL: 0.5 mg/dL (ref 0.2–1.2)
Indirect Bilirubin: 0.4 mg/dL (ref 0.2–1.2)
Total Protein: 6.2 g/dL (ref 6.1–8.1)

## 2015-10-02 LAB — HEMOGLOBIN A1C
Hgb A1c MFr Bld: 7.2 % — ABNORMAL HIGH (ref <5.7)
Mean Plasma Glucose: 160 mg/dL — ABNORMAL HIGH (ref <117)

## 2015-10-02 NOTE — Patient Instructions (Signed)

## 2015-10-02 NOTE — Progress Notes (Signed)
Patient ID: [name], female   DOB: [birth year], 81 y.o.   MRN: [id number]   This very [facility] 81 [facility] month follow up with Hypertension, Hyperlipidemia, T2_DM wCKD3 and Vitamin D Deficiency. Also, patient has GERD which is controlled with diet & meds. Patient is scheduled tomorrow for an EDSI to the lumbar area by Dr Regino SchultzeWang.    Patient is treated for HTN since 1987 and was started at that time on Mexiletine for multi-focal PVC's and cardiac cath was negative then.  BP has been controlled and today's BP: 122/66 mmHg. Patient has had no complaints of any cardiac type chest pain, palpitations, dyspnea/orthopnea/PND, dizziness, claudication, or dependent edema.   Hyperlipidemia is controlled with diet & meds. Patient denies myalgias or other med SE's. Last Lipids were at goal with Cholesterol 143; HDL 51; LDL 48; and elevated  Triglycerides 221 on 06/21/2015.   Also, the patient has history of T2_DM w/ CKD3 since 2013 and has had no symptoms of reactive hypoglycemia, diabetic polys, paresthesias or visual blurring.  Last A1c was not at goal with A1c 7.1% on 06/21/2015.   Further, the patient also has history of Vitamin D Deficiency of 24 in 2009  and supplements vitamin D without any suspected side-effects. Last vitamin D was 48 on 06/21/2015.  Medication Sig  . aspirin 81 MG tablet Take 81 mg by mouth daily.  [name] Kitchen. atenolol  100 MG tablet Take 1 tablet by mouth  daily  . Atorvastatin 80 MG tablet Take 1 tablet by mouth  daily for cholesterol  . Biotin 10 MG CAPS Take by mouth.  [name] Kitchen. VITAMIN D1000 UNITS  Take 1,000 Units by mouth daily.  . citalopram  40 MG tablet   . cloNIDine  0.1 MG tablet Take 1 tablet (0.1 mg total) by mouth 2 (two) times daily.  [name] Kitchen. COLACE 100 MG capsule Take 1 capsule (100 mg total) by mouth 3 (three) times daily as needed.  . furosemide  40 MG tablet Take 1 tablet by mouth  daily  . losartan  100 MG tablet take 1 tablet by mouth once daily  . Magnesium 400 MG CAPS  Take by mouth daily.  [name] Kitchen. mexiletine 150 MG capsule Take 1 capsule (150 mg total) by mouth 3 (three) times daily.  . Multiple Vitamins-Minerals  Take 1 tablet by mouth daily.  . naproxen  500 MG tablet   . omeprazole 20 MG capsule Take 20 mg by mouth daily.  . ondansetron  4 MG tablet Take 1 tablet (4 mg total) by mouth daily as needed for nausea or vomiting.  [name] Kitchen. SYNTHROID 100 MCG tab take 1 tablet by mouth once daily  . traZODone 100 MG tablet take 3 tablets by mouth at bedtime   Allergies  Allergen Reactions  . Augmentin [Amoxicillin-Pot Clavulanate] vaginitis  . Codeine rash  . Hytrin [Terazosin] incontinence  . Oxycodone rash  . Procardia [Nifedipine] edema  . Vasotec [Enalapril] cough   PMHx:   Past Medical History  Diagnosis Date  . Hypertension   . Hyperlipidemia   . Anxiety   . Depression   . Thyroid disease   . Anemia   . Type II or unspecified type diabetes mellitus with renal manifestations, not stated as uncontrolled     stage II  . History of toe surgery left  . GERD (gastroesophageal reflux disease)   . Wears glasses    Immunization History  Administered Date(s) Administered  . Influenza, High Dose Seasonal PF  09/05/2014, 10/02/2015  . Influenza-Unspecified 09/10/2013  . Pneumococcal-Unspecified 11/26/2012  . Td 11/26/2012  . Tdap 08/24/2014   Past Surgical History  Procedure Laterality Date  . Knee surgery    . Abdominal hysterectomy    . Tonsillectomy    . Appendectomy    . Cerebral aneurysm repair  09/2007    coil ablation  . Joint replacement  2012    left total knee  . Total knee revision  2012    left  . Total knee arthroplasty  2005    right  . Total knee revision  2007    right  . Hammer toe surgery  2008    osteotomy=left foot  . Ganglion cyst excision  2008    lt ring finger  . Cholecystectomy  2012    lap choli  . Colonoscopy    . Shoulder arthroscopy with rotator cuff repair and subacromial decompression Right 01/29/2015     Procedure: RIGHT SHOULDER ARTHROSCOPY WITH DEBRIDEMENT ROTATOR CUFF TEAR, SUBACROMIAL DECOMPRESSION, DISTAL CLAVICAL RESECTION;  Surgeon: Jones Broom, MD;  Location: [facility];  Service: Orthopedics;  Laterality: Right;  Right shoulder arthroscopy debridement rotatoro cuff tear, subacromial decompression, distal clavical resection   FHx:    Reviewed / unchanged  SHx:    Reviewed / unchanged  Systems Review:  Constitutional: Denies fever, chills, wt changes, headaches, insomnia, fatigue, night sweats, change in appetite. Eyes: Denies redness, blurred vision, diplopia, discharge, itchy, watery eyes.  ENT: Denies discharge, congestion, post nasal drip, epistaxis, sore throat, earache, hearing loss, dental pain, tinnitus, vertigo, sinus pain, snoring.  CV: Denies chest pain, palpitations, irregular heartbeat, syncope, dyspnea, diaphoresis, orthopnea, PND, claudication or edema. Respiratory: denies cough, dyspnea, DOE, pleurisy, hoarseness, laryngitis, wheezing.  Gastrointestinal: Denies dysphagia, odynophagia, heartburn, reflux, water brash, abdominal pain or cramps, nausea, vomiting, bloating, diarrhea, constipation, hematemesis, melena, hematochezia  or hemorrhoids. Genitourinary: Denies dysuria, frequency, urgency, nocturia, hesitancy, discharge, hematuria or flank pain. Musculoskeletal: Denies arthralgias, myalgias, stiffness, jt. swelling, pain, limping or strain/sprain.  Skin: Denies pruritus, rash, hives, warts, acne, eczema or change in skin lesion(s). Neuro: No weakness, tremor, incoordination, spasms, paresthesia or pain. Psychiatric: Denies confusion, memory loss or sensory loss. Endo: Denies change in weight, skin or hair change.  Heme/Lymph: No excessive bleeding, bruising or enlarged lymph nodes.  Physical Exam  BP 122/66 mmHg  Pulse 60  Temp(Src) 97.5 F (36.4 C)  Resp 16  Ht 5' 2.5" (1.588 m)  Wt 157 lb 3.2 oz (71.305 kg)  BMI 28.28 kg/m2  Appears well  nourished and in no distress. Eyes: PERRLA, EOMs, conjunctiva no swelling or erythema. Sinuses: No frontal/maxillary tenderness ENT/Mouth: EAC's clear, TM's nl w/o erythema, bulging. Nares clear w/o erythema, swelling, exudates. Oropharynx clear without erythema or exudates. Oral hygiene is good. Tongue normal, non obstructing. Hearing intact.  Neck: Supple. Thyroid nl. Car 2+/2+ without bruits, nodes or JVD. Chest: Respirations nl with BS clear & equal w/o rales, rhonchi, wheezing or stridor.  Cor: Heart sounds normal w/ regular rate and rhythm without sig. murmurs, gallops, clicks, or rubs. Peripheral pulses normal and equal  without edema.  Abdomen: Soft & bowel sounds normal. Non-tender w/o guarding, rebound, hernias, masses, or organomegaly.  Lymphatics: Unremarkable.  Musculoskeletal: Full ROM all peripheral extremities, joint stability, 5/5 strength, and normal gait.  Skin: Warm, dry without exposed rashes, lesions or ecchymosis apparent.  Neuro: Cranial nerves intact, reflexes equal bilaterally. Sensory-motor testing grossly intact. Tendon reflexes grossly intact.  Pysch: Alert & oriented x 3.  Insight and judgement nl & appropriate. No ideations.  Assessment and Plan:  1. Essential hypertension  - TSH  2. Mixed hyperlipidemia  - Lipid panel  3. CKD stage 3 due to type 2 diabetes mellitus (HCC)  - Hemoglobin A1c - Insulin, random  4. Vitamin D deficiency  - VITAMIN D 25 Hydroxy   5. Hypothyroidism   6. GERD   7. BMI 28.0-28.9,adult   8. Need for prophylactic vaccination and inoculation against influenza  - Flu vaccine HIGH DOSE PF (Fluzone High dose)  9. Medication management  - CBC with Differential/Platelet - BASIC METABOLIC PANEL WITH GFR - Hepatic function panel - Magnesium   Recommended regular exercise, BP monitoring, weight control, and discussed med and SE's. Recommended labs to assess and monitor clinical status. Further disposition pending  results of labs. Over 30 minutes of exam, counseling, chart review was performed

## 2015-10-03 DIAGNOSIS — M5416 Radiculopathy, lumbar region: Secondary | ICD-10-CM | POA: Diagnosis not present

## 2015-10-03 LAB — VITAMIN D 25 HYDROXY (VIT D DEFICIENCY, FRACTURES): VIT D 25 HYDROXY: 47 ng/mL (ref 30–100)

## 2015-10-03 LAB — TSH: TSH: 0.451 u[IU]/mL (ref 0.350–4.500)

## 2015-10-03 LAB — INSULIN, RANDOM: Insulin: 7.8 u[IU]/mL (ref 2.0–19.6)

## 2015-10-04 ENCOUNTER — Other Ambulatory Visit: Payer: Self-pay | Admitting: Internal Medicine

## 2015-10-04 MED ORDER — METFORMIN HCL ER 500 MG PO TB24: ORAL_TABLET | ORAL | Status: DC

## 2015-10-09 ENCOUNTER — Other Ambulatory Visit: Payer: Self-pay | Admitting: *Deleted

## 2015-10-09 MED ORDER — GLUCOSE BLOOD VI STRP: ORAL_STRIP | Status: DC

## 2015-10-15 ENCOUNTER — Telehealth: Payer: Self-pay | Admitting: *Deleted

## 2015-10-15 ENCOUNTER — Other Ambulatory Visit: Payer: Self-pay | Admitting: Internal Medicine

## 2015-10-15 NOTE — Telephone Encounter (Signed)
Patient called and states she has been having diarrhea since starting Metformin 3 times a day.  Per Dr Oneta RackMcKeown, stop the med fro several days and restart at 1 tab a day with her largest meal.  Also, it is OK for her to take Immodium up to 8 tabs daily if needed, per Dr Oneta RackMcKeown.

## 2015-10-16 ENCOUNTER — Other Ambulatory Visit: Payer: Self-pay | Admitting: Internal Medicine

## 2015-10-16 DIAGNOSIS — M4806 Spinal stenosis, lumbar region: Secondary | ICD-10-CM | POA: Diagnosis not present

## 2015-11-27 DIAGNOSIS — M4806 Spinal stenosis, lumbar region: Secondary | ICD-10-CM | POA: Diagnosis not present

## 2015-11-29 ENCOUNTER — Other Ambulatory Visit: Payer: Self-pay | Admitting: Internal Medicine

## 2015-12-12 DIAGNOSIS — M4806 Spinal stenosis, lumbar region: Secondary | ICD-10-CM | POA: Diagnosis not present

## 2016-01-02 ENCOUNTER — Ambulatory Visit (INDEPENDENT_AMBULATORY_CARE_PROVIDER_SITE_OTHER): Payer: Medicare Other | Admitting: Physician Assistant

## 2016-01-02 ENCOUNTER — Other Ambulatory Visit: Payer: Self-pay

## 2016-01-02 ENCOUNTER — Encounter: Payer: Self-pay | Admitting: Physician Assistant

## 2016-01-02 VITALS — BP 130/80 | HR 62 | Temp 97.9°F | Resp 16 | Ht 62.5 in | Wt 153.4 lb

## 2016-01-02 DIAGNOSIS — E782 Mixed hyperlipidemia: Secondary | ICD-10-CM

## 2016-01-02 DIAGNOSIS — I10 Essential (primary) hypertension: Secondary | ICD-10-CM

## 2016-01-02 DIAGNOSIS — E039 Hypothyroidism, unspecified: Secondary | ICD-10-CM | POA: Diagnosis not present

## 2016-01-02 DIAGNOSIS — N183 Chronic kidney disease, stage 3 (moderate): Secondary | ICD-10-CM | POA: Diagnosis not present

## 2016-01-02 DIAGNOSIS — E1129 Type 2 diabetes mellitus with other diabetic kidney complication: Secondary | ICD-10-CM | POA: Diagnosis not present

## 2016-01-02 DIAGNOSIS — E559 Vitamin D deficiency, unspecified: Secondary | ICD-10-CM | POA: Diagnosis not present

## 2016-01-02 DIAGNOSIS — E1122 Type 2 diabetes mellitus with diabetic chronic kidney disease: Secondary | ICD-10-CM

## 2016-01-02 DIAGNOSIS — Z79899 Other long term (current) drug therapy: Secondary | ICD-10-CM

## 2016-01-02 LAB — CBC WITH DIFFERENTIAL/PLATELET
BASOS PCT: 1 % (ref 0–1)
Basophils Absolute: 0.1 10*3/uL (ref 0.0–0.1)
Eosinophils Absolute: 0.4 10*3/uL (ref 0.0–0.7)
Eosinophils Relative: 4 % (ref 0–5)
HEMATOCRIT: 35.1 % — AB (ref 36.0–46.0)
HEMOGLOBIN: 11.4 g/dL — AB (ref 12.0–15.0)
LYMPHS ABS: 2.4 10*3/uL (ref 0.7–4.0)
Lymphocytes Relative: 24 % (ref 12–46)
MCH: 30.6 pg (ref 26.0–34.0)
MCHC: 32.5 g/dL (ref 30.0–36.0)
MCV: 94.1 fL (ref 78.0–100.0)
MONO ABS: 0.7 10*3/uL (ref 0.1–1.0)
MONOS PCT: 7 % (ref 3–12)
MPV: 9.9 fL (ref 8.6–12.4)
NEUTROS ABS: 6.4 10*3/uL (ref 1.7–7.7)
NEUTROS PCT: 64 % (ref 43–77)
Platelets: 280 10*3/uL (ref 150–400)
RBC: 3.73 MIL/uL — ABNORMAL LOW (ref 3.87–5.11)
RDW: 13.1 % (ref 11.5–15.5)
WBC: 10 10*3/uL (ref 4.0–10.5)

## 2016-01-02 NOTE — Patient Instructions (Addendum)
Tumeric with black pepper extract is a great natural antiinflammatory that helps with arthritis and aches and pain. Can get from [employer] or any health food store. Need to take at least  twice a day with food.   Ask about PT     Bad carbs also include fruit juice, alcohol, and sweet tea. These are empty calories that do not signal to your brain that you are full.   Please remember the good carbs are still carbs which convert into sugar. So please measure them out no more than 1/2-1 cup of rice, oatmeal, pasta, and beans  Veggies are however free foods! Pile them on.   Not all fruit is created equal. Please see the list below, the fruit at the bottom is higher in sugars than the fruit at the top. Please avoid all dried fruits.

## 2016-01-02 NOTE — Progress Notes (Signed)
Patient ID: [name], female   DOB: May 02, 1933, 82 y.o.   MRN: [id number]  Assessment and Plan:  Hypertension:  -Continue medication,  -monitor blood pressure at home.  -Continue DASH diet.   -Reminder to go to the ER if any CP, SOB, nausea, dizziness, severe HA, changes vision/speech, left arm numbness and tingling, and jaw pain.  Cholesterol: -Continue diet and exercise.  -Check cholesterol.   Diabetes with CKD -Continue diet and exercise.  -Check A1C  Vitamin D Def: -check level -continue medications.  Hypothyroidism -check TSH level, continue medications the same, reminded to take on an empty stomach 30-47mins before food.   Continue diet and meds as discussed. Further disposition pending results of labs.  HPI 82 y.o. female  presents for 3 month follow up with hypertension, hyperlipidemia, prediabetes and vitamin D.   Her blood pressure has been controlled at home, today their BP is BP: 130/80 mmHg.   She does workout. She denies chest pain, shortness of breath, dizziness.  She has been doing some walking but limited by right buttocks pain/spinal stenosis, has had several injections, was put on gabapentin that she states makes her tired, following Dr. Regino Schultze. She is on Mexiletine for PVC's, recent Echo 01/2015, follows with Dr. Delton See.   She is on cholesterol medication and denies myalgias. Her cholesterol is at goal. The cholesterol last visit was:   Lab Results  Component Value Date   CHOL 127 10/02/2015   HDL 46 10/02/2015   LDLCALC 51 10/02/2015   TRIG 152* 10/02/2015   CHOLHDL 2.8 10/02/2015    She has been working on diet and exercise for diabetes with CKD, she is bASA, she is on ARB, she is off metformin due to stomach cramps, and denies foot ulcerations, hyperglycemia, hypoglycemia , increased appetite, nausea, paresthesia of the feet, polydipsia, polyuria, visual disturbances, vomiting and weight loss. Last A1C in the office was:  Lab Results  Component Value  Date   HGBA1C 7.2* 10/02/2015   Patient is on Vitamin D supplement.  Lab Results  Component Value Date   VD25OH 47 10/02/2015     She is on thyroid medication. Her medication was not changed last visit.   Lab Results  Component Value Date   TSH 0.451 10/02/2015   BMI is Body mass index is 27.59 kg/(m^2)., she is working on diet and exercise. Wt Readings from Last 3 Encounters:  01/02/16 153 lb 6.4 oz (69.582 kg)  10/02/15 157 lb 3.2 oz (71.305 kg)  08/08/15 157 lb (71.215 kg)   Current Medications:  Current Outpatient Prescriptions on File Prior to Visit  Medication Sig Dispense Refill  . aspirin 81 MG tablet Take 81 mg by mouth daily.    [name] Kitchen atenolol (TENORMIN) 100 MG tablet Take 1 tablet by mouth  daily 120 tablet 1  . atorvastatin (LIPITOR) 80 MG tablet take 1 tablet by mouth once daily for cholesterol 120 tablet 1  . Biotin 10 MG CAPS Take by mouth.    . cholecalciferol (VITAMIN D) 1000 UNITS tablet Take 1,000 Units by mouth daily.    . citalopram (CELEXA) 40 MG tablet take 1 tablet by mouth once daily 180 tablet 1  . cloNIDine (CATAPRES) 0.1 MG tablet Take 1 tablet (0.1 mg total) by mouth 2 (two) times daily. 60 tablet 0  . docusate sodium (COLACE) 100 MG capsule Take 1 capsule (100 mg total) by mouth 3 (three) times daily as needed. 20 capsule 0  . furosemide (LASIX) 40 MG tablet  Take 1 tablet by mouth  daily 120 tablet 1  . glucose blood test strip Check blood sugar 1 time daily. 100 each 12  . losartan (COZAAR) 100 MG tablet take 1 tablet by mouth once daily 30 tablet 3  . Magnesium 400 MG CAPS Take by mouth daily.    [name] Kitchen mexiletine (MEXITIL) 150 MG capsule Take 1 capsule (150 mg total) by mouth 3 (three) times daily. 270 capsule 1  . Multiple Vitamins-Minerals (MULTIVITAMIN WITH MINERALS) tablet Take 1 tablet by mouth daily.    . naproxen (NAPROSYN) 500 MG tablet   0  . omeprazole (PRILOSEC) 20 MG capsule Take 20 mg by mouth daily.    [name] Kitchen SYNTHROID 100 MCG tablet take 1  tablet by mouth once daily 90 tablet 4  . traMADol (ULTRAM) 50 MG tablet Take by mouth 2 (two) times daily.    . traZODone (DESYREL) 100 MG tablet take 3 tablets by mouth at bedtime 270 tablet 1  . verapamil (VERELAN PM) 120 MG 24 hr capsule take 1 capsule by mouth twice a day with food for blood pressure 180 capsule 1   No current facility-administered medications on file prior to visit.    Medical History:  Past Medical History  Diagnosis Date  . Hypertension   . Hyperlipidemia   . Anxiety   . Depression   . Thyroid disease   . Anemia   . Type II or unspecified type diabetes mellitus with renal manifestations, not stated as uncontrolled     stage II  . History of toe surgery left  . GERD (gastroesophageal reflux disease)   . Wears glasses     Allergies:  Allergies  Allergen Reactions  . Augmentin [Amoxicillin-Pot Clavulanate]     vaginitis  . Codeine     rash  . Hytrin [Terazosin]     incontinence   . Oxycodone     rash  . Procardia [Nifedipine]     edema  . Vasotec [Enalapril]     cough     Review of Systems:  Review of Systems  Constitutional: Negative for fever, chills and malaise/fatigue.  HENT: Negative for congestion, ear pain and sore throat.   Respiratory: Negative for cough, shortness of breath and wheezing.   Cardiovascular: Negative for chest pain, palpitations, claudication and leg swelling.  Gastrointestinal: Negative for heartburn, diarrhea and constipation.  Genitourinary: Negative.   Skin: Negative.   Neurological: Negative for dizziness, sensory change, loss of consciousness and headaches.  Psychiatric/Behavioral: Negative for depression. The patient is not nervous/anxious and does not have insomnia.     Family history- Review and unchanged  Social history- Review and unchanged  Physical Exam: BP 130/80 mmHg  Pulse 62  Temp(Src) 97.9 F (36.6 C) (Temporal)  Resp 16  Ht 5' 2.5" (1.588 m)  Wt 153 lb 6.4 oz (69.582 kg)  BMI 27.59  kg/m2  SpO2 95% Wt Readings from Last 3 Encounters:  01/02/16 153 lb 6.4 oz (69.582 kg)  10/02/15 157 lb 3.2 oz (71.305 kg)  08/08/15 157 lb (71.215 kg)    General Appearance: Well nourished well developed, in no apparent distress. Eyes: PERRLA, EOMs, conjunctiva no swelling or erythema ENT/Mouth: Ear canals normal without obstruction, swelling, erythma, discharge.  TMs normal bilaterally.  Oropharynx moist, clear, without exudate, or postoropharyngeal swelling. Neck: Supple, thyroid normal,no cervical adenopathy  Respiratory: Respiratory effort normal, Breath sounds clear A&P without rhonchi, wheeze, or rale.  No retractions, no accessory usage. Cardio: RRR with no RGs.  2/6  murmur. Brisk peripheral pulses without edema.  Abdomen: Soft, + BS,  Non tender, no guarding, rebound, hernias, masses. Musculoskeletal: Full ROM, 5/5 strength, Normal gait Skin: Warm, dry without rashes, lesions, ecchymosis.  Neuro: Awake and oriented X 3, Cranial nerves intact. Normal muscle tone, no cerebellar symptoms. Psych: Normal affect, Insight and Judgment appropriate.    Quentin Mulling, PA-C 2:37 PM [facility]

## 2016-01-03 LAB — BASIC METABOLIC PANEL WITH GFR
BUN: 20 mg/dL (ref 7–25)
CALCIUM: 8.8 mg/dL (ref 8.6–10.4)
CHLORIDE: 98 mmol/L (ref 98–110)
CO2: 30 mmol/L (ref 20–31)
CREATININE: 1.28 mg/dL — AB (ref 0.60–0.88)
GFR, Est African American: 45 mL/min — ABNORMAL LOW (ref >=60)
GFR, Est Non African American: 39 mL/min — ABNORMAL LOW (ref >=60)
GLUCOSE: 120 mg/dL — AB (ref 65–99)
Potassium: 3.7 mmol/L (ref 3.5–5.3)
SODIUM: 143 mmol/L (ref 135–146)

## 2016-01-03 LAB — HEPATIC FUNCTION PANEL
ALBUMIN: 3.9 g/dL (ref 3.6–5.1)
ALK PHOS: 61 U/L (ref 33–130)
ALT: 13 U/L (ref 6–29)
AST: 23 U/L (ref 10–35)
Bilirubin, Direct: <0.1 mg/dL (ref <=0.2)
Total Bilirubin: 0.3 mg/dL (ref 0.2–1.2)
Total Protein: 6.3 g/dL (ref 6.1–8.1)

## 2016-01-03 LAB — LIPID PANEL
Cholesterol: 127 mg/dL (ref 125–200)
HDL: 52 mg/dL (ref >=46)
LDL CALC: 43 mg/dL (ref <130)
TRIGLYCERIDES: 158 mg/dL — AB (ref <150)
Total CHOL/HDL Ratio: 2.4 Ratio (ref <=5.0)
VLDL: 32 mg/dL — AB (ref <30)

## 2016-01-03 LAB — HEMOGLOBIN A1C
HEMOGLOBIN A1C: 7 % — AB (ref <5.7)
Mean Plasma Glucose: 154 mg/dL — ABNORMAL HIGH (ref <117)

## 2016-01-03 LAB — TSH: TSH: 0.42 mIU/L

## 2016-01-03 LAB — MAGNESIUM: Magnesium: 2 mg/dL (ref 1.5–2.5)

## 2016-01-09 DIAGNOSIS — M4806 Spinal stenosis, lumbar region: Secondary | ICD-10-CM | POA: Diagnosis not present

## 2016-01-14 DIAGNOSIS — M5416 Radiculopathy, lumbar region: Secondary | ICD-10-CM | POA: Diagnosis not present

## 2016-01-15 ENCOUNTER — Other Ambulatory Visit: Payer: Self-pay | Admitting: Internal Medicine

## 2016-01-16 ENCOUNTER — Encounter: Payer: Self-pay | Admitting: Internal Medicine

## 2016-01-20 DIAGNOSIS — M545 Low back pain: Secondary | ICD-10-CM | POA: Diagnosis not present

## 2016-01-22 DIAGNOSIS — M4806 Spinal stenosis, lumbar region: Secondary | ICD-10-CM | POA: Diagnosis not present

## 2016-01-24 ENCOUNTER — Other Ambulatory Visit: Payer: Self-pay | Admitting: Orthopedic Surgery

## 2016-01-30 ENCOUNTER — Other Ambulatory Visit: Payer: Self-pay | Admitting: Internal Medicine

## 2016-02-01 ENCOUNTER — Ambulatory Visit (HOSPITAL_COMMUNITY)
Admission: RE | Admit: 2016-02-01 | Discharge: 2016-02-01 | Disposition: A | Discharge status: Home / Self Care | Payer: Medicare Other | Source: Ambulatory Visit | Attending: Orthopedic Surgery | Admitting: Orthopedic Surgery

## 2016-02-01 ENCOUNTER — Encounter (HOSPITAL_COMMUNITY): Payer: Self-pay

## 2016-02-01 ENCOUNTER — Encounter (HOSPITAL_COMMUNITY)
Admission: RE | Admit: 2016-02-01 | Discharge: 2016-02-01 | Disposition: A | Discharge status: Home / Self Care | Payer: Medicare Other | Source: Ambulatory Visit | Attending: Orthopedic Surgery | Admitting: Orthopedic Surgery

## 2016-02-01 DIAGNOSIS — Z01818 Encounter for other preprocedural examination: Secondary | ICD-10-CM | POA: Diagnosis not present

## 2016-02-01 DIAGNOSIS — I10 Essential (primary) hypertension: Secondary | ICD-10-CM | POA: Diagnosis not present

## 2016-02-01 DIAGNOSIS — Z79899 Other long term (current) drug therapy: Secondary | ICD-10-CM | POA: Insufficient documentation

## 2016-02-01 DIAGNOSIS — Z885 Allergy status to narcotic agent status: Secondary | ICD-10-CM | POA: Insufficient documentation

## 2016-02-01 DIAGNOSIS — Z7982 Long term (current) use of aspirin: Secondary | ICD-10-CM | POA: Insufficient documentation

## 2016-02-01 DIAGNOSIS — Z888 Allergy status to other drugs, medicaments and biological substances status: Secondary | ICD-10-CM | POA: Insufficient documentation

## 2016-02-01 DIAGNOSIS — M79604 Pain in right leg: Secondary | ICD-10-CM | POA: Diagnosis not present

## 2016-02-01 DIAGNOSIS — Z0183 Encounter for blood typing: Secondary | ICD-10-CM | POA: Diagnosis not present

## 2016-02-01 DIAGNOSIS — K219 Gastro-esophageal reflux disease without esophagitis: Secondary | ICD-10-CM | POA: Diagnosis not present

## 2016-02-01 DIAGNOSIS — N289 Disorder of kidney and ureter, unspecified: Secondary | ICD-10-CM | POA: Insufficient documentation

## 2016-02-01 DIAGNOSIS — E785 Hyperlipidemia, unspecified: Secondary | ICD-10-CM | POA: Insufficient documentation

## 2016-02-01 DIAGNOSIS — Z881 Allergy status to other antibiotic agents status: Secondary | ICD-10-CM | POA: Diagnosis not present

## 2016-02-01 DIAGNOSIS — Z01812 Encounter for preprocedural laboratory examination: Secondary | ICD-10-CM | POA: Diagnosis not present

## 2016-02-01 DIAGNOSIS — R001 Bradycardia, unspecified: Secondary | ICD-10-CM | POA: Insufficient documentation

## 2016-02-01 DIAGNOSIS — I517 Cardiomegaly: Secondary | ICD-10-CM | POA: Diagnosis not present

## 2016-02-01 DIAGNOSIS — E1129 Type 2 diabetes mellitus with other diabetic kidney complication: Secondary | ICD-10-CM | POA: Diagnosis not present

## 2016-02-01 HISTORY — DX: Insomnia, unspecified: G47.00

## 2016-02-01 HISTORY — DX: Personal history of other medical treatment: Z92.89

## 2016-02-01 HISTORY — DX: Unspecified osteoarthritis, unspecified site: M19.90

## 2016-02-01 HISTORY — DX: Constipation, unspecified: K59.00

## 2016-02-01 LAB — CBC WITH DIFFERENTIAL/PLATELET
BASOS PCT: 1 %
Basophils Absolute: 0.1 10*3/uL (ref 0.0–0.1)
Eosinophils Absolute: 0.2 10*3/uL (ref 0.0–0.7)
Eosinophils Relative: 2 %
HEMATOCRIT: 37.2 % (ref 36.0–46.0)
HEMOGLOBIN: 12.1 g/dL (ref 12.0–15.0)
LYMPHS ABS: 2.5 10*3/uL (ref 0.7–4.0)
Lymphocytes Relative: 28 %
MCH: 30.9 pg (ref 26.0–34.0)
MCHC: 32.5 g/dL (ref 30.0–36.0)
MCV: 95.1 fL (ref 78.0–100.0)
MONO ABS: 0.6 10*3/uL (ref 0.1–1.0)
MONOS PCT: 7 %
NEUTROS ABS: 5.6 10*3/uL (ref 1.7–7.7)
NEUTROS PCT: 62 %
Platelets: 261 10*3/uL (ref 150–400)
RBC: 3.91 MIL/uL (ref 3.87–5.11)
RDW: 13.4 % (ref 11.5–15.5)
WBC: 8.9 10*3/uL (ref 4.0–10.5)

## 2016-02-01 LAB — COMPREHENSIVE METABOLIC PANEL
ALK PHOS: 68 U/L (ref 38–126)
ALT: 27 U/L (ref 14–54)
ANION GAP: 11 (ref 5–15)
AST: 36 U/L (ref 15–41)
Albumin: 4.2 g/dL (ref 3.5–5.0)
BILIRUBIN TOTAL: 0.5 mg/dL (ref 0.3–1.2)
BUN: 15 mg/dL (ref 6–20)
CALCIUM: 9.5 mg/dL (ref 8.9–10.3)
CO2: 31 mmol/L (ref 22–32)
Chloride: 99 mmol/L — ABNORMAL LOW (ref 101–111)
Creatinine, Ser: 1.24 mg/dL — ABNORMAL HIGH (ref 0.44–1.00)
GFR, EST AFRICAN AMERICAN: 46 mL/min — AB (ref >60)
GFR, EST NON AFRICAN AMERICAN: 40 mL/min — AB (ref >60)
Glucose, Bld: 126 mg/dL — ABNORMAL HIGH (ref 65–99)
POTASSIUM: 3.7 mmol/L (ref 3.5–5.1)
Sodium: 141 mmol/L (ref 135–145)
TOTAL PROTEIN: 6.7 g/dL (ref 6.5–8.1)

## 2016-02-01 LAB — URINALYSIS, ROUTINE W REFLEX MICROSCOPIC
Bilirubin Urine: NEGATIVE
Glucose, UA: NEGATIVE mg/dL
HGB URINE DIPSTICK: NEGATIVE
Ketones, ur: NEGATIVE mg/dL
Nitrite: NEGATIVE
PH: 6 (ref 5.0–8.0)
Protein, ur: NEGATIVE mg/dL
SPECIFIC GRAVITY, URINE: 1.019 (ref 1.005–1.030)

## 2016-02-01 LAB — TYPE AND SCREEN
ABO/RH(D): O POS
ANTIBODY SCREEN: NEGATIVE

## 2016-02-01 LAB — PROTIME-INR
INR: 1.06 (ref 0.00–1.49)
PROTHROMBIN TIME: 14 s (ref 11.6–15.2)

## 2016-02-01 LAB — URINE MICROSCOPIC-ADD ON

## 2016-02-01 LAB — GLUCOSE, CAPILLARY: Glucose-Capillary: 162 mg/dL — ABNORMAL HIGH (ref 65–99)

## 2016-02-01 LAB — SURGICAL PCR SCREEN
MRSA, PCR: NEGATIVE
STAPHYLOCOCCUS AUREUS: NEGATIVE

## 2016-02-01 LAB — APTT: aPTT: 31 seconds (ref 24–37)

## 2016-02-01 NOTE — Pre-Procedure Instructions (Signed)
    [name]  [facility]      [id number] [place] - [address] [place] [id number] Phone: [phone] Fax: [phone]    Your procedure is scheduled on Wed, Mar 29 @ 8:30 AM  [facility] at [facility]:30 AM  Call this number if you have problems the morning of surgery:  [phone]   Remember:  Do not eat food or drink liquids after midnight.  Take these medicines the morning of surgery with A SIP OF WATER Atenolol(Tenormin),Celexa(Citalopram),Clonidine(Catapres),Pain Pill(if needed),Omeprazole(Prilosec),and Synthroid(Levothyroxine)               Stop taking your Aspirin and Naprosyn along with any Vitamins or Herbal Medications. No Goody's,BC's,Advil,Motrin,Ibuprofen,or Fish Oil.   Do not wear jewelry, make-up or nail polish.  Do not wear lotions, powders, or perfumes.  You may wear deodorant.  Do not shave 48 hours prior to surgery.    Do not bring valuables to the hospital.  [facility] is not responsible for any belongings or valuables.  Contacts, dentures or bridgework may not be worn into surgery.  Leave your suitcase in the car.  After surgery it may be brought to your room.  For patients admitted to the hospital, discharge time will be determined by your treatment team.  Patients discharged the day of surgery will not be allowed to drive home.      Please read over the following fact sheets that you were given. Pain Booklet, Coughing and Deep Breathing, Blood Transfusion Information, MRSA Information and Surgical Site Infection Prevention

## 2016-02-01 NOTE — Pre-Procedure Instructions (Signed)
    Mat Carnelaine B Klingbeil  02/01/2016      [facility] Ad[facility] Wednesday, March 29 at 6:30 AM                  Your surgery or procedure is scheduled for 8:30 AM   Call this number if you have problems the morning of surgery: [phone]                For any other questions, please call [phone], Monday - Friday 8 AM - 4 PM.   Remember:  Do not eat food or drink liquids after midnight.  Take these medicines the morning of surgery with A SIP OF WATER Atenolol(Tenormin),Celexa(Citalopram),Clonidine(Catapres),Pain Pill(if needed),Omeprazole(Prilosec),and Synthroid(Levothyroxine)               Stop taking your Aspirin and Naprosyn along with any Vitamins or Herbal Medications. No Goody's,BC's,Advil,Motrin,Ibuprofen,or Fish Oil.   Do not wear jewelry, make-up or nail polish.  Do not wear lotions, powders, or perfumes.  You may wear deodorant.  Do not shave 48 hours prior to surgery.    Do not bring valuables to the hospital.  [facility] is not responsible for any belongings or valuables.  Contacts, dentures or bridgework may not be worn into surgery.  Leave your suitcase in the car.  After surgery it may be brought to your room.  For patients admitted to the hospital, discharge time will be determined by your treatment team.  Patients discharged the day of surgery will not be allowed to drive home.      Please read over the following fact sheets that you were given. Pain Booklet, Coughing and Deep Breathing, Blood Transfusion Information, MRSA Information and Surgical Site Infection Prevention

## 2016-02-01 NOTE — Pre-Procedure Instructions (Signed)
    [name]  02/01/2016      [facility] Admitting Wednesday, March 29 at 6:30 AM  Call this number if you have problems the morning of surgery:  [phone]   Remember:  Do not eat food or drink liquids after midnight.  Take these medicines the morning of surgery with A SIP OF WATER Atenolol(Tenormin),Celexa(Citalopram),Clonidine(Catapres),Pain Pill(if needed),Omeprazole(Prilosec),and Synthroid(Levothyroxine)               Stop taking your Aspirin and Naprosyn along with any Vitamins or Herbal Medications. No Goody's,BC's,Advil,Motrin,Ibuprofen,or Fish Oil.   Do not wear jewelry, make-up or nail polish.  Do not wear lotions, powders, or perfumes.  You may wear deodorant.  Do not shave 48 hours prior to surgery.    Do not bring valuables to the hospital.  [facility] is not responsible for any belongings or valuables.  Contacts, dentures or bridgework may not be worn into surgery.  Leave your suitcase in the car.  After surgery it may be brought to your room.  For patients admitted to the hospital, discharge time will be determined by your treatment team.  Patients discharged the day of surgery will not be allowed to drive home.      Please read over the following fact sheets that you were given. Pain Booklet, Coughing and Deep Breathing, Blood Transfusion Information, MRSA Information and Surgical Site Infection Prevention

## 2016-02-02 LAB — HEMOGLOBIN A1C
Hgb A1c MFr Bld: 6.7 % — ABNORMAL HIGH (ref 4.8–5.6)
Mean Plasma Glucose: 146 mg/dL

## 2016-02-04 NOTE — Progress Notes (Signed)
Anesthesia Chart Review: Patient is a 82 year old female scheduled for right sided L4-5 fusion on 02/06/16 by Dr. Yevette Edwardsumonski.  History includes non-smoker, HTN, HLD, anemia, anxiety, depression, DM2, thyroid disease (on Synthroid), arthritis, hysterectomy, tonsillectomy, appendectomy, right TKA '05 s/p revision for infection '06, cerebral aneurysm repair '08, cholecystectomy '09, right shoulder arthroscopy '16. PCP is Dr. Oneta RackMcKeown. 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 Tobias AlexanderKatarina Nelson in 01/2015 for pre-operative evaluation prior to her shoulder surgery due to finding of murmur on physical exam. Echo showed no significant valvular abnormalities.    Meds include ASA (on hold), atenolol, Lipitor, Celexa, clonidine, Lasix, gabapentin, Norco, losartan, magnesium, mexiletine, Synthroid, trazodone, verapamil.   PAT Vitals: BP 156/51, HR 58, RR 20 , T 36.7C, O2 sat 95%.  02/01/16 EKG X 2 reviewed. 13:55:36 tracing confirmed in Muse by cardiologist Dr. Mariah MillingGollan) as SB at 57 bpm.    01/18/15 Echo: Impressions: - Normal LV size with mild focal basal septal hypertrophy, EF 55-60%. Grade 1 diastolic dysfunction. Normal RV size and systolic function. No significant valvular abnormalities.  02/01/16 CXR: IMPRESSION: 1. Left base subsegmental atelectasis and or scarring. No acute cardiopulmonary disease. 2. Cardiomegaly.  Preoperative labs noted. Cr 1.24. Glucose 126. A1c 6.7. CBC, PT, PTT WNL.   If no acute changes then I anticipate that she can proceed as planned.  Velna Ochsllison Levar Fayson, PA-C [facility]/Anesthesiology Phone [phone] 02/04/2016 11:16 AM

## 2016-02-05 MED ORDER — SODIUM CHLORIDE 0.9 % IV SOLN
1250.0000 mg | INTRAVENOUS | Status: AC
  Administered 2016-02-06: 1250 mg via INTRAVENOUS
  Filled 2016-02-05: qty 1250

## 2016-02-05 MED ORDER — CEFAZOLIN SODIUM-DEXTROSE 2-4 GM/100ML-% IV SOLN
2.0000 g | INTRAVENOUS | Status: AC
  Administered 2016-02-06: 2 g via INTRAVENOUS
  Filled 2016-02-05 (×2): qty 100

## 2016-02-05 NOTE — H&P (Signed)
PREOPERATIVE H&P  Chief Complaint: R leg pain  HPI: [name] is a 82 y.o. female who presents with ongoing pain in the right leg x 2 years  MRI reveals severe spinal stenosis at L4/5 in addition to instability  Patient has failed multiple forms of conservative care and continues to have pain (see office notes for additional details regarding the patient's full course of treatment)  Past Medical History  Diagnosis Date  . Hypertension   . Hyperlipidemia   . Anxiety   . Depression   . Thyroid disease   . Anemia   . History of toe surgery left  . GERD (gastroesophageal reflux disease)   . Wears glasses   . Type II or unspecified type diabetes mellitus with renal manifestations, not stated as uncontrolled     stage II, does not take medication  . Constipation   . Arthritis   . History of blood transfusion   . Insomnia    Past Surgical History  Procedure Laterality Date  . Abdominal hysterectomy    . Tonsillectomy    . Appendectomy    . Cerebral aneurysm repair  09/2007    coil ablation  . Joint replacement Left 2012    left total knee  . Total knee arthroplasty Right 2005    right  . Total knee revision Right 2007    right  . Hammer toe surgery  2008    osteotomy=left foot  . Ganglion cyst excision Left 2008    lt ring finger  . Cholecystectomy  2012    lap choli  . Colonoscopy    . Shoulder arthroscopy with rotator cuff repair and subacromial decompression Right 01/29/2015    Procedure: RIGHT SHOULDER ARTHROSCOPY WITH DEBRIDEMENT ROTATOR CUFF TEAR, SUBACROMIAL DECOMPRESSION, DISTAL CLAVICAL RESECTION;  Surgeon: Jones BroomJustin Chandler, MD;  Location: [facility];  Service: Orthopedics;  Laterality: Right;  Right shoulder arthroscopy debridement rotatoro cuff tear, subacromial decompression, distal clavical resection   Social History   Social History  . Marital Status: Widowed    Spouse Name: N/A  . Number of Children: N/A  . Years of Education:  N/A   Social History Main Topics  . Smoking status: Never Smoker   . Smokeless tobacco: Never Used  . Alcohol Use: Yes     Comment: rare  . Drug Use: No  . Sexual Activity: No   Other Topics Concern  . Not on file   Social History Narrative   Family History  Problem Relation Age of Onset  . Cancer Mother     breast cancer  . Hypertension Mother   . Mental illness Mother     alz  . Cancer Father     colon cancer  . Hypertension Father   . Cancer Maternal Aunt     breast cancer with mets   Allergies  Allergen Reactions  . Codeine Rash    rash  . Vasotec [Enalapril] Other (See Comments)    cough  . Augmentin [Amoxicillin-Pot Clavulanate] Other (See Comments)    vaginitis  . Hytrin [Terazosin] Other (See Comments)    incontinence   . Oxycodone Rash    rash  . Procardia [Nifedipine] Other (See Comments)    edema   Prior to Admission medications   Medication Sig Start Date End Date Taking? Authorizing Provider  aspirin 81 MG tablet Take 81 mg by mouth daily.   Yes Historical Provider, MD  atenolol (TENORMIN) 100 MG tablet take 1 tablet  by mouth once daily 01/15/16  Yes Courtney Forcucci, PA-C  atorvastatin (LIPITOR) 80 MG tablet take 1 tablet by mouth once daily for cholesterol 11/29/15  Yes [name], MD  Biotin 10 MG CAPS Take 1 tablet by mouth daily.    Yes Historical Provider, MD  cholecalciferol (VITAMIN D) 1000 UNITS tablet Take 1,000 Units by mouth daily.   Yes Historical Provider, MD  citalopram (CELEXA) 40 MG tablet take 1 tablet by mouth once daily 10/16/15  Yes [name], MD  cloNIDine (CATAPRES) 0.1 MG tablet Take 1 tablet (0.1 mg total) by mouth 2 (two) times daily. 02/07/15 02/07/16 Yes Courtney Forcucci, PA-C  docusate sodium (COLACE) 100 MG capsule Take 1 capsule (100 mg total) by mouth 3 (three) times daily as needed. Patient taking differently: Take 100 mg by mouth 3 (three) times daily as needed for mild constipation.  01/29/15  Yes Jiles Harold, PA-C  furosemide (LASIX) 40 MG tablet Take 1 tablet by mouth  daily 04/05/15  Yes [name], MD  gabapentin (NEURONTIN) 300 MG capsule take 1 capsule by mouth once daily at bedtime 12/12/15  Yes Historical Provider, MD  HYDROcodone-acetaminophen (NORCO/VICODIN) 5-325 MG tablet Take 1 tablet by mouth every 6 (six) hours as needed for moderate pain.   Yes Historical Provider, MD  losartan (COZAAR) 100 MG tablet take 1 tablet by mouth once daily 11/29/15  Yes [name], MD  Magnesium 400 MG CAPS Take by mouth daily.   Yes Historical Provider, MD  mexiletine (MEXITIL) 150 MG capsule Take 1 capsule (150 mg total) by mouth 3 (three) times daily. Patient taking differently: Take 150 mg by mouth daily.  07/12/15  Yes [name], MD  Multiple Vitamins-Minerals (MULTIVITAMIN WITH MINERALS) tablet Take 1 tablet by mouth daily.   Yes Historical Provider, MD  omeprazole (PRILOSEC) 20 MG capsule Take 20 mg by mouth daily.   Yes Historical Provider, MD  SYNTHROID 100 MCG tablet take 1 tablet by mouth once daily 01/30/16  Yes [name], MD  traZODone (DESYREL) 100 MG tablet take 3 tablets by mouth at bedtime Patient taking differently: take 2 tablets by mouth at bedtime 01/15/16  Yes Courtney Forcucci, PA-C  verapamil (VERELAN PM) 120 MG 24 hr capsule take 1 capsule by mouth twice a day with food for blood pressure 11/29/15  Yes [name], MD  glucose blood test strip Check blood sugar 1 time daily. 10/09/15   [name], MD  naproxen (NAPROSYN) 500 MG tablet  08/06/15   Historical Provider, MD  traMADol (ULTRAM) 50 MG tablet Take by mouth 2 (two) times daily.    Historical Provider, MD     All other systems have been reviewed and were otherwise negative with the exception of those mentioned in the HPI and as above.  Physical Exam: There were no vitals filed for this visit.  General: Alert, no acute distress Cardiovascular: No pedal edema Respiratory: No cyanosis, no use  of accessory musculature Skin: No lesions in the area of chief complaint Neurologic: Sensation intact distally Psychiatric: Patient is competent for consent with normal mood and affect Lymphatic: No axillary or cervical lymphadenopathy  MUSCULOSKELETAL: + SLR on the right  Assessment/Pl an: Right leg pain Plan for Procedure(s): RIGHT SIDED LUMBAR 4-5 TRANSFORAMINAL LUMBAR INTERBODY FUSION WITH INSTRUMENTATION AND ALLOGRAFT   Emilee Hero, MD 02/05/2016 12:22 PM

## 2016-02-06 ENCOUNTER — Inpatient Hospital Stay (HOSPITAL_COMMUNITY): Payer: Medicare Other | Admitting: Vascular Surgery

## 2016-02-06 ENCOUNTER — Inpatient Hospital Stay (HOSPITAL_COMMUNITY): Payer: Medicare Other

## 2016-02-06 ENCOUNTER — Encounter (HOSPITAL_COMMUNITY)
Admission: RE | Disposition: A | Discharge status: Home Health | Payer: Self-pay | Source: Ambulatory Visit | Attending: Orthopedic Surgery

## 2016-02-06 ENCOUNTER — Inpatient Hospital Stay (HOSPITAL_COMMUNITY)
Admission: RE | Admit: 2016-02-06 | Discharge: 2016-02-07 | DRG: 460 | Disposition: A | Discharge status: Home Health | Payer: Medicare Other | Source: Ambulatory Visit | Attending: Orthopedic Surgery | Admitting: Orthopedic Surgery

## 2016-02-06 ENCOUNTER — Inpatient Hospital Stay (HOSPITAL_COMMUNITY): Payer: Medicare Other | Admitting: Anesthesiology

## 2016-02-06 ENCOUNTER — Encounter (HOSPITAL_COMMUNITY): Payer: Self-pay | Admitting: *Deleted

## 2016-02-06 DIAGNOSIS — K5900 Constipation, unspecified: Secondary | ICD-10-CM | POA: Diagnosis present

## 2016-02-06 DIAGNOSIS — K219 Gastro-esophageal reflux disease without esophagitis: Secondary | ICD-10-CM | POA: Diagnosis present

## 2016-02-06 DIAGNOSIS — M4806 Spinal stenosis, lumbar region: Principal | ICD-10-CM | POA: Diagnosis present

## 2016-02-06 DIAGNOSIS — F419 Anxiety disorder, unspecified: Secondary | ICD-10-CM | POA: Diagnosis present

## 2016-02-06 DIAGNOSIS — N182 Chronic kidney disease, stage 2 (mild): Secondary | ICD-10-CM | POA: Diagnosis present

## 2016-02-06 DIAGNOSIS — M5416 Radiculopathy, lumbar region: Secondary | ICD-10-CM | POA: Diagnosis present

## 2016-02-06 DIAGNOSIS — E785 Hyperlipidemia, unspecified: Secondary | ICD-10-CM | POA: Diagnosis present

## 2016-02-06 DIAGNOSIS — M79604 Pain in right leg: Secondary | ICD-10-CM | POA: Diagnosis not present

## 2016-02-06 DIAGNOSIS — Z7982 Long term (current) use of aspirin: Secondary | ICD-10-CM

## 2016-02-06 DIAGNOSIS — M1990 Unspecified osteoarthritis, unspecified site: Secondary | ICD-10-CM | POA: Diagnosis not present

## 2016-02-06 DIAGNOSIS — E079 Disorder of thyroid, unspecified: Secondary | ICD-10-CM | POA: Diagnosis present

## 2016-02-06 DIAGNOSIS — E1129 Type 2 diabetes mellitus with other diabetic kidney complication: Secondary | ICD-10-CM | POA: Diagnosis not present

## 2016-02-06 DIAGNOSIS — Z96653 Presence of artificial knee joint, bilateral: Secondary | ICD-10-CM | POA: Diagnosis present

## 2016-02-06 DIAGNOSIS — M4316 Spondylolisthesis, lumbar region: Secondary | ICD-10-CM | POA: Diagnosis present

## 2016-02-06 DIAGNOSIS — I10 Essential (primary) hypertension: Secondary | ICD-10-CM | POA: Diagnosis present

## 2016-02-06 DIAGNOSIS — M5410 Radiculopathy, site unspecified: Secondary | ICD-10-CM | POA: Diagnosis present

## 2016-02-06 DIAGNOSIS — F329 Major depressive disorder, single episode, unspecified: Secondary | ICD-10-CM | POA: Diagnosis present

## 2016-02-06 DIAGNOSIS — Z419 Encounter for procedure for purposes other than remedying health state, unspecified: Secondary | ICD-10-CM

## 2016-02-06 DIAGNOSIS — Z79899 Other long term (current) drug therapy: Secondary | ICD-10-CM | POA: Diagnosis not present

## 2016-02-06 HISTORY — DX: Radiculopathy, site unspecified: M54.10

## 2016-02-06 HISTORY — PX: BACK SURGERY: SHX140

## 2016-02-06 LAB — GLUCOSE, CAPILLARY
GLUCOSE-CAPILLARY: 153 mg/dL — AB (ref 65–99)
GLUCOSE-CAPILLARY: 114 mg/dL — AB (ref 65–99)
Glucose-Capillary: 127 mg/dL — ABNORMAL HIGH (ref 65–99)
Glucose-Capillary: 128 mg/dL — ABNORMAL HIGH (ref 65–99)

## 2016-02-06 SURGERY — POSTERIOR LUMBAR FUSION 1 LEVEL
Anesthesia: General | Laterality: Right

## 2016-02-06 MED ORDER — METHYLENE BLUE 0.5 % INJ SOLN
INTRAVENOUS | Status: AC
  Filled 2016-02-06: qty 10

## 2016-02-06 MED ORDER — CITALOPRAM HYDROBROMIDE 40 MG PO TABS
40.0000 mg | ORAL_TABLET | Freq: Every day | ORAL | Status: DC
  Filled 2016-02-06 (×2): qty 1

## 2016-02-06 MED ORDER — VANCOMYCIN HCL IN DEXTROSE 750-5 MG/150ML-% IV SOLN
750.0000 mg | INTRAVENOUS | Status: DC
  Filled 2016-02-06: qty 150

## 2016-02-06 MED ORDER — MIDAZOLAM HCL 2 MG/2ML IJ SOLN
INTRAMUSCULAR | Status: AC
  Filled 2016-02-06: qty 2

## 2016-02-06 MED ORDER — LEVOTHYROXINE SODIUM 100 MCG PO TABS
100.0000 ug | ORAL_TABLET | Freq: Every day | ORAL | Status: DC
  Administered 2016-02-07: 100 ug via ORAL
  Filled 2016-02-06: qty 1

## 2016-02-06 MED ORDER — SUGAMMADEX SODIUM 500 MG/5ML IV SOLN
INTRAVENOUS | Status: DC | PRN
  Administered 2016-02-06: 270 mg via INTRAVENOUS

## 2016-02-06 MED ORDER — HYDROMORPHONE HCL 1 MG/ML IJ SOLN
0.2500 mg | INTRAMUSCULAR | Status: DC | PRN
  Administered 2016-02-06: 0.5 mg via INTRAVENOUS
  Administered 2016-02-06 (×2): 0.25 mg via INTRAVENOUS

## 2016-02-06 MED ORDER — PROPOFOL 10 MG/ML IV BOLUS
INTRAVENOUS | Status: AC
  Filled 2016-02-06: qty 40

## 2016-02-06 MED ORDER — PROPOFOL 500 MG/50ML IV EMUL
INTRAVENOUS | Status: DC | PRN
  Administered 2016-02-06: 25 ug/kg/min via INTRAVENOUS

## 2016-02-06 MED ORDER — POVIDONE-IODINE 7.5 % EX SOLN
Freq: Once | CUTANEOUS | Status: DC
  Filled 2016-02-06: qty 118

## 2016-02-06 MED ORDER — MAGNESIUM OXIDE 400 (241.3 MG) MG PO TABS
400.0000 mg | ORAL_TABLET | Freq: Every day | ORAL | Status: DC
  Filled 2016-02-06 (×2): qty 1

## 2016-02-06 MED ORDER — MENTHOL 3 MG MT LOZG: 1.0000 | LOZENGE | OROMUCOSAL | Status: DC | PRN

## 2016-02-06 MED ORDER — ACETAMINOPHEN 325 MG PO TABS: 650.0000 mg | ORAL_TABLET | ORAL | Status: DC | PRN

## 2016-02-06 MED ORDER — BUPIVACAINE-EPINEPHRINE 0.25% -1:200000 IJ SOLN
INTRAMUSCULAR | Status: DC | PRN
  Administered 2016-02-06: 5 mL
  Administered 2016-02-06: 20 mL

## 2016-02-06 MED ORDER — FUROSEMIDE 40 MG PO TABS: 40.0000 mg | ORAL_TABLET | Freq: Every day | ORAL | Status: DC

## 2016-02-06 MED ORDER — METHYLENE BLUE 0.5 % INJ SOLN
INTRAVENOUS | Status: DC | PRN
  Administered 2016-02-06: 1 mL via SUBMUCOSAL

## 2016-02-06 MED ORDER — ZOLPIDEM TARTRATE 5 MG PO TABS
5.0000 mg | ORAL_TABLET | Freq: Every evening | ORAL | Status: DC | PRN
Start: 2016-02-06 — End: 2016-02-07

## 2016-02-06 MED ORDER — PROPOFOL 1000 MG/100ML IV EMUL
INTRAVENOUS | Status: AC
  Filled 2016-02-06: qty 100

## 2016-02-06 MED ORDER — NEOSTIGMINE METHYLSULFATE 10 MG/10ML IV SOLN
INTRAVENOUS | Status: AC
  Filled 2016-02-06: qty 1

## 2016-02-06 MED ORDER — 0.9 % SODIUM CHLORIDE (POUR BTL) OPTIME
TOPICAL | Status: DC | PRN
  Administered 2016-02-06: 1000 mL

## 2016-02-06 MED ORDER — TRAZODONE HCL 100 MG PO TABS
200.0000 mg | ORAL_TABLET | Freq: Every day | ORAL | Status: DC
  Administered 2016-02-06: 200 mg via ORAL
  Filled 2016-02-06: qty 2

## 2016-02-06 MED ORDER — SODIUM CHLORIDE 0.9% FLUSH: 3.0000 mL | INTRAVENOUS | Status: DC | PRN

## 2016-02-06 MED ORDER — MEXILETINE HCL 150 MG PO CAPS
150.0000 mg | ORAL_CAPSULE | Freq: Every day | ORAL | Status: DC
  Filled 2016-02-06 (×3): qty 1

## 2016-02-06 MED ORDER — GABAPENTIN 300 MG PO CAPS
300.0000 mg | ORAL_CAPSULE | Freq: Three times a day (TID) | ORAL | Status: DC
  Administered 2016-02-06: 300 mg via ORAL
  Filled 2016-02-06: qty 1

## 2016-02-06 MED ORDER — ADULT MULTIVITAMIN W/MINERALS CH
1.0000 | ORAL_TABLET | Freq: Every day | ORAL | Status: DC
  Filled 2016-02-06: qty 1

## 2016-02-06 MED ORDER — PHENYLEPHRINE HCL 10 MG/ML IJ SOLN
10.0000 mg | INTRAMUSCULAR | Status: DC | PRN
  Administered 2016-02-06: 40 ug/min via INTRAVENOUS

## 2016-02-06 MED ORDER — FENTANYL CITRATE (PF) 100 MCG/2ML IJ SOLN
INTRAMUSCULAR | Status: DC | PRN
  Administered 2016-02-06: 50 ug via INTRAVENOUS
  Administered 2016-02-06: 100 ug via INTRAVENOUS
  Administered 2016-02-06 (×2): 50 ug via INTRAVENOUS

## 2016-02-06 MED ORDER — ROCURONIUM BROMIDE 100 MG/10ML IV SOLN
INTRAVENOUS | Status: DC | PRN
  Administered 2016-02-06: 30 mg via INTRAVENOUS
  Administered 2016-02-06: 20 mg via INTRAVENOUS

## 2016-02-06 MED ORDER — LIDOCAINE HCL (CARDIAC) 20 MG/ML IV SOLN
INTRAVENOUS | Status: DC | PRN
  Administered 2016-02-06: 60 mg via INTRAVENOUS

## 2016-02-06 MED ORDER — LOSARTAN POTASSIUM 50 MG PO TABS: 100.0000 mg | ORAL_TABLET | Freq: Every day | ORAL | Status: DC

## 2016-02-06 MED ORDER — ATORVASTATIN CALCIUM 20 MG PO TABS
80.0000 mg | ORAL_TABLET | Freq: Every day | ORAL | Status: DC
  Filled 2016-02-06: qty 4

## 2016-02-06 MED ORDER — PANTOPRAZOLE SODIUM 40 MG PO TBEC: 40.0000 mg | DELAYED_RELEASE_TABLET | Freq: Every day | ORAL | Status: DC

## 2016-02-06 MED ORDER — GLYCOPYRROLATE 0.2 MG/ML IJ SOLN
INTRAMUSCULAR | Status: AC
  Filled 2016-02-06: qty 3

## 2016-02-06 MED ORDER — VERAPAMIL HCL ER 120 MG PO CP24: 120.0000 mg | ORAL_CAPSULE | Freq: Two times a day (BID) | ORAL | Status: DC

## 2016-02-06 MED ORDER — LIDOCAINE HCL (CARDIAC) 20 MG/ML IV SOLN
INTRAVENOUS | Status: AC
  Filled 2016-02-06: qty 5

## 2016-02-06 MED ORDER — MIDAZOLAM HCL 5 MG/5ML IJ SOLN
INTRAMUSCULAR | Status: DC | PRN
  Administered 2016-02-06 (×2): 0.5 mg via INTRAVENOUS

## 2016-02-06 MED ORDER — VITAMIN D 1000 UNITS PO TABS: 1000.0000 [IU] | ORAL_TABLET | Freq: Every day | ORAL | Status: DC

## 2016-02-06 MED ORDER — HYDROMORPHONE HCL 1 MG/ML IJ SOLN
0.5000 mg | INTRAMUSCULAR | Status: DC | PRN
  Administered 2016-02-06: 1 mg via INTRAVENOUS
  Filled 2016-02-06: qty 1

## 2016-02-06 MED ORDER — BISACODYL 5 MG PO TBEC: 5.0000 mg | DELAYED_RELEASE_TABLET | Freq: Every day | ORAL | Status: DC | PRN

## 2016-02-06 MED ORDER — HYDROMORPHONE HCL 1 MG/ML IJ SOLN
INTRAMUSCULAR | Status: AC
  Filled 2016-02-06: qty 1

## 2016-02-06 MED ORDER — BUPIVACAINE-EPINEPHRINE (PF) 0.25% -1:200000 IJ SOLN
INTRAMUSCULAR | Status: AC
  Filled 2016-02-06: qty 30

## 2016-02-06 MED ORDER — ACETAMINOPHEN 650 MG RE SUPP
650.0000 mg | RECTAL | Status: DC | PRN
Start: 2016-02-06 — End: 2016-02-07

## 2016-02-06 MED ORDER — SENNOSIDES-DOCUSATE SODIUM 8.6-50 MG PO TABS
1.0000 | ORAL_TABLET | Freq: Every evening | ORAL | Status: DC | PRN
  Administered 2016-02-06: 1 via ORAL
  Filled 2016-02-06: qty 1

## 2016-02-06 MED ORDER — THROMBIN 20000 UNITS EX KIT
PACK | CUTANEOUS | Status: DC | PRN
  Administered 2016-02-06: 20000 [IU] via TOPICAL

## 2016-02-06 MED ORDER — DIPHENHYDRAMINE HCL 50 MG/ML IJ SOLN
12.5000 mg | Freq: Four times a day (QID) | INTRAMUSCULAR | Status: DC | PRN
  Filled 2016-02-06: qty 0.25

## 2016-02-06 MED ORDER — ARTIFICIAL TEARS OP OINT
TOPICAL_OINTMENT | OPHTHALMIC | Status: DC | PRN
  Administered 2016-02-06: 1 via OPHTHALMIC

## 2016-02-06 MED ORDER — CLONIDINE HCL 0.1 MG PO TABS: 0.1000 mg | ORAL_TABLET | Freq: Two times a day (BID) | ORAL | Status: DC

## 2016-02-06 MED ORDER — ONDANSETRON HCL 4 MG/2ML IJ SOLN
INTRAMUSCULAR | Status: DC | PRN
  Administered 2016-02-06: 4 mg via INTRAVENOUS

## 2016-02-06 MED ORDER — ARTIFICIAL TEARS OP OINT
TOPICAL_OINTMENT | OPHTHALMIC | Status: AC
  Filled 2016-02-06: qty 3.5

## 2016-02-06 MED ORDER — ALUM & MAG HYDROXIDE-SIMETH 200-200-20 MG/5ML PO SUSP: 30.0000 mL | Freq: Four times a day (QID) | ORAL | Status: DC | PRN

## 2016-02-06 MED ORDER — BIOTIN 10 MG PO CAPS: 1.0000 | ORAL_CAPSULE | Freq: Every day | ORAL | Status: DC

## 2016-02-06 MED ORDER — FENTANYL CITRATE (PF) 250 MCG/5ML IJ SOLN
INTRAMUSCULAR | Status: AC
  Filled 2016-02-06: qty 5

## 2016-02-06 MED ORDER — DIPHENHYDRAMINE HCL 25 MG PO CAPS: 25.0000 mg | ORAL_CAPSULE | ORAL | Status: DC | PRN

## 2016-02-06 MED ORDER — LACTATED RINGERS IV SOLN
INTRAVENOUS | Status: DC | PRN
  Administered 2016-02-06 (×2): via INTRAVENOUS

## 2016-02-06 MED ORDER — ROCURONIUM BROMIDE 50 MG/5ML IV SOLN
INTRAVENOUS | Status: AC
  Filled 2016-02-06: qty 1

## 2016-02-06 MED ORDER — THROMBIN 20000 UNITS EX SOLR
CUTANEOUS | Status: AC
  Filled 2016-02-06: qty 20000

## 2016-02-06 MED ORDER — DOCUSATE SODIUM 100 MG PO CAPS: 100.0000 mg | ORAL_CAPSULE | Freq: Three times a day (TID) | ORAL | Status: DC | PRN

## 2016-02-06 MED ORDER — PHENOL 1.4 % MT LIQD: 1.0000 | OROMUCOSAL | Status: DC | PRN

## 2016-02-06 MED ORDER — SUGAMMADEX SODIUM 500 MG/5ML IV SOLN
INTRAVENOUS | Status: AC
  Filled 2016-02-06: qty 5

## 2016-02-06 MED ORDER — HEMOSTATIC AGENTS (NO CHARGE) OPTIME
TOPICAL | Status: DC | PRN
  Administered 2016-02-06: 1 via TOPICAL

## 2016-02-06 MED ORDER — SODIUM CHLORIDE 0.9% FLUSH: 3.0000 mL | Freq: Two times a day (BID) | INTRAVENOUS | Status: DC

## 2016-02-06 MED ORDER — SODIUM CHLORIDE 0.9 % IV SOLN
250.0000 mL | INTRAVENOUS | Status: DC
Start: 2016-02-06 — End: 2016-02-07

## 2016-02-06 MED ORDER — SODIUM CHLORIDE 0.9 % IV SOLN
INTRAVENOUS | Status: DC
Start: 2016-02-06 — End: 2016-02-07

## 2016-02-06 MED ORDER — PROPOFOL 10 MG/ML IV BOLUS
INTRAVENOUS | Status: DC | PRN
  Administered 2016-02-06: 90 mg via INTRAVENOUS
  Administered 2016-02-06: 20 mg via INTRAVENOUS

## 2016-02-06 MED ORDER — OXYCODONE-ACETAMINOPHEN 5-325 MG PO TABS
1.0000 | ORAL_TABLET | ORAL | Status: DC | PRN
  Administered 2016-02-06 – 2016-02-07 (×4): 2 via ORAL
  Filled 2016-02-06 (×4): qty 2

## 2016-02-06 MED ORDER — FLEET ENEMA 7-19 GM/118ML RE ENEM: 1.0000 | ENEMA | Freq: Once | RECTAL | Status: DC | PRN

## 2016-02-06 MED ORDER — ONDANSETRON HCL 4 MG/2ML IJ SOLN
INTRAMUSCULAR | Status: AC
  Filled 2016-02-06: qty 2

## 2016-02-06 MED ORDER — DOCUSATE SODIUM 100 MG PO CAPS
100.0000 mg | ORAL_CAPSULE | Freq: Two times a day (BID) | ORAL | Status: DC
  Administered 2016-02-06: 100 mg via ORAL
  Filled 2016-02-06: qty 1

## 2016-02-06 MED ORDER — VERAPAMIL HCL ER 120 MG PO TBCR
120.0000 mg | EXTENDED_RELEASE_TABLET | Freq: Two times a day (BID) | ORAL | Status: DC
  Filled 2016-02-06 (×3): qty 1

## 2016-02-06 MED ORDER — MAGNESIUM 400 MG PO CAPS: ORAL_CAPSULE | Freq: Every day | ORAL | Status: DC

## 2016-02-06 MED ORDER — DIAZEPAM 5 MG PO TABS
ORAL_TABLET | ORAL | Status: AC
  Filled 2016-02-06: qty 1

## 2016-02-06 MED ORDER — DIAZEPAM 5 MG PO TABS
5.0000 mg | ORAL_TABLET | Freq: Four times a day (QID) | ORAL | Status: DC | PRN
  Administered 2016-02-06 (×2): 5 mg via ORAL
  Filled 2016-02-06: qty 1

## 2016-02-06 MED ORDER — ATENOLOL 100 MG PO TABS
100.0000 mg | ORAL_TABLET | Freq: Every day | ORAL | Status: DC
  Filled 2016-02-06 (×2): qty 1

## 2016-02-06 MED ORDER — HYDROCODONE-ACETAMINOPHEN 5-325 MG PO TABS: 1.0000 | ORAL_TABLET | ORAL | Status: DC | PRN

## 2016-02-06 MED ORDER — ONDANSETRON HCL 4 MG/2ML IJ SOLN: 4.0000 mg | INTRAMUSCULAR | Status: DC | PRN

## 2016-02-06 SURGICAL SUPPLY — 88 items
BENZOIN TINCTURE PRP APPL 2/3 (GAUZE/BANDAGES/DRESSINGS) ×3 IMPLANT
BLADE SURG ROTATE 9660 (MISCELLANEOUS) IMPLANT
BUR PRESCISION 1.7 ELITE (BURR) ×3 IMPLANT
BUR ROUND PRECISION 4.0 (BURR) ×2 IMPLANT
BUR ROUND PRECISION 4.0MM (BURR) ×1
BUR SABER RD CUTTING 3.0 (BURR) IMPLANT
BUR SABER RD CUTTING 3.0MM (BURR)
CAGE BULLET CONCORDE 9X9X27 (Cage) ×2 IMPLANT
CAGE BULLET CONCORDE 9X9X27MM (Cage) ×1 IMPLANT
CARTRIDGE OIL MAESTRO DRILL (MISCELLANEOUS) ×1 IMPLANT
CLOSURE STERI-STRIP 1/2X4 (GAUZE/BANDAGES/DRESSINGS) ×1
CLOSURE WOUND 1/2 X4 (GAUZE/BANDAGES/DRESSINGS) ×2
CLSR STERI-STRIP ANTIMIC 1/2X4 (GAUZE/BANDAGES/DRESSINGS) ×2 IMPLANT
CONT SPEC STER OR (MISCELLANEOUS) ×3 IMPLANT
COVER MAYO STAND STRL (DRAPES) ×6 IMPLANT
COVER SURGICAL LIGHT HANDLE (MISCELLANEOUS) ×3 IMPLANT
DIFFUSER DRILL AIR PNEUMATIC (MISCELLANEOUS) ×3 IMPLANT
DRAIN CHANNEL 15F RND FF W/TCR (WOUND CARE) ×3 IMPLANT
DRAPE C-ARM 42X72 X-RAY (DRAPES) ×3 IMPLANT
DRAPE C-ARMOR (DRAPES) IMPLANT
DRAPE POUCH INSTRU U-SHP 10X18 (DRAPES) ×3 IMPLANT
DRAPE SURG 17X23 STRL (DRAPES) ×12 IMPLANT
DURAPREP 26ML APPLICATOR (WOUND CARE) ×3 IMPLANT
ELECT BLADE 4.0 EZ CLEAN MEGAD (MISCELLANEOUS) ×3
ELECT CAUTERY BLADE 6.4 (BLADE) ×3 IMPLANT
ELECT REM PT RETURN 9FT ADLT (ELECTROSURGICAL) ×3
ELECTRODE BLDE 4.0 EZ CLN MEGD (MISCELLANEOUS) ×1 IMPLANT
ELECTRODE REM PT RTRN 9FT ADLT (ELECTROSURGICAL) ×1 IMPLANT
EVACUATOR SILICONE 100CC (DRAIN) ×3 IMPLANT
FEE INTRAOP MONITOR IMPULS NCS (MISCELLANEOUS) ×1 IMPLANT
GAUZE SPONGE 4X4 12PLY STRL (GAUZE/BANDAGES/DRESSINGS) ×3 IMPLANT
GAUZE SPONGE 4X4 16PLY XRAY LF (GAUZE/BANDAGES/DRESSINGS) ×12 IMPLANT
GLOVE BIO SURGEON STRL SZ7 (GLOVE) ×3 IMPLANT
GLOVE BIO SURGEON STRL SZ8 (GLOVE) ×3 IMPLANT
GLOVE BIOGEL PI IND STRL 7.0 (GLOVE) ×1 IMPLANT
GLOVE BIOGEL PI IND STRL 8 (GLOVE) ×1 IMPLANT
GLOVE BIOGEL PI INDICATOR 7.0 (GLOVE) ×2
GLOVE BIOGEL PI INDICATOR 8 (GLOVE) ×2
GOWN STRL REUS W/ TWL LRG LVL3 (GOWN DISPOSABLE) ×2 IMPLANT
GOWN STRL REUS W/ TWL XL LVL3 (GOWN DISPOSABLE) ×1 IMPLANT
GOWN STRL REUS W/TWL LRG LVL3 (GOWN DISPOSABLE) ×4
GOWN STRL REUS W/TWL XL LVL3 (GOWN DISPOSABLE) ×2
INTRAOP MONITOR FEE IMPULS NCS (MISCELLANEOUS) ×1
INTRAOP MONITOR FEE IMPULSE (MISCELLANEOUS) ×2
IV CATH 14GX2 1/4 (CATHETERS) ×3 IMPLANT
KIT BASIN OR (CUSTOM PROCEDURE TRAY) ×3 IMPLANT
KIT POSITION SURG JACKSON T1 (MISCELLANEOUS) IMPLANT
KIT ROOM TURNOVER OR (KITS) ×3 IMPLANT
MARKER SKIN DUAL TIP RULER LAB (MISCELLANEOUS) ×3 IMPLANT
NDL SAFETY ECLIPSE 18X1.5 (NEEDLE) ×1 IMPLANT
NEEDLE 22X1 1/2 (OR ONLY) (NEEDLE) ×3 IMPLANT
NEEDLE HYPO 18GX1.5 SHARP (NEEDLE) ×2
NEEDLE HYPO 25GX1X1/2 BEV (NEEDLE) ×3 IMPLANT
NEEDLE SPNL 18GX3.5 QUINCKE PK (NEEDLE) ×6 IMPLANT
NS IRRIG 1000ML POUR BTL (IV SOLUTION) ×3 IMPLANT
OIL CARTRIDGE MAESTRO DRILL (MISCELLANEOUS) ×3
PACK LAMINECTOMY ORTHO (CUSTOM PROCEDURE TRAY) ×3 IMPLANT
PACK UNIVERSAL I (CUSTOM PROCEDURE TRAY) ×3 IMPLANT
PAD ARMBOARD 7.5X6 YLW CONV (MISCELLANEOUS) ×6 IMPLANT
PATTIES SURGICAL .5 X1 (DISPOSABLE) ×3 IMPLANT
PATTIES SURGICAL .5X1.5 (GAUZE/BANDAGES/DRESSINGS) ×3 IMPLANT
PROBE PEDCLE PROBE MAGSTM DISP (MISCELLANEOUS) ×3 IMPLANT
PUTTY BONE DBX 5CC MIX (Putty) ×6 IMPLANT
ROD PRE BENT EXPEDIUM 35MM (Rod) ×6 IMPLANT
SCREW SET SINGLE INNER (Screw) ×12 IMPLANT
SCREW VIPER CORT FIX 5.00X35 (Screw) ×12 IMPLANT
SPONGE GAUZE 4X4 12PLY STER LF (GAUZE/BANDAGES/DRESSINGS) ×3 IMPLANT
SPONGE INTESTINAL PEANUT (DISPOSABLE) ×3 IMPLANT
SPONGE SURGIFOAM ABS GEL 100 (HEMOSTASIS) ×3 IMPLANT
STRIP CLOSURE SKIN 1/2X4 (GAUZE/BANDAGES/DRESSINGS) ×4 IMPLANT
SURGIFLO W/THROMBIN 8M KIT (HEMOSTASIS) IMPLANT
SUT ETHILON 2 0 FS 18 (SUTURE) ×3 IMPLANT
SUT MNCRL AB 4-0 PS2 18 (SUTURE) ×6 IMPLANT
SUT VIC AB 0 CT1 18XCR BRD 8 (SUTURE) ×1 IMPLANT
SUT VIC AB 0 CT1 8-18 (SUTURE) ×2
SUT VIC AB 1 CT1 18XCR BRD 8 (SUTURE) ×2 IMPLANT
SUT VIC AB 1 CT1 8-18 (SUTURE) ×4
SUT VIC AB 2-0 CT2 18 VCP726D (SUTURE) ×3 IMPLANT
SYR 20CC LL (SYRINGE) ×3 IMPLANT
SYR BULB IRRIGATION 50ML (SYRINGE) ×3 IMPLANT
SYR CONTROL 10ML LL (SYRINGE) ×6 IMPLANT
SYR TB 1ML LUER SLIP (SYRINGE) ×3 IMPLANT
TAPE CLOTH SURG 4X10 WHT LF (GAUZE/BANDAGES/DRESSINGS) ×3 IMPLANT
TOWEL OR 17X24 6PK STRL BLUE (TOWEL DISPOSABLE) ×3 IMPLANT
TOWEL OR 17X26 10 PK STRL BLUE (TOWEL DISPOSABLE) ×3 IMPLANT
TRAY FOLEY CATH 16FRSI W/METER (SET/KITS/TRAYS/PACK) ×3 IMPLANT
WATER STERILE IRR 1000ML POUR (IV SOLUTION) ×3 IMPLANT
YANKAUER SUCT BULB TIP NO VENT (SUCTIONS) ×3 IMPLANT

## 2016-02-06 NOTE — Transfer of Care (Signed)
Immediate Anesthesia Transfer of Care Note  Patient: [name]  Procedure(s) Performed: Procedure(s) with comments: RIGHT SIDED LUMBAR 4-5 TRANSFORAMINAL LUMBAR INTERBODY FUSION WITH INSTRUMENTATION AND ALLOGRAFT (Right) - RIGHT SIDED LUMBAR 4-5 TRANSFORAMINAL LUMBAR INTERBODY FUSION WITH INSTRUMENTATION AND ALLOGRAFT  Patient Location: PACU  Anesthesia Type:General  Level of Consciousness: awake, alert , oriented and patient cooperative  Airway & Oxygen Therapy: Patient Spontanous Breathing and Patient connected to face mask oxygen  Post-op Assessment: Report given to RN, Post -op Vital signs reviewed and stable and Patient moving all extremities  Post vital signs: Reviewed and stable  Last Vitals:  Filed Vitals:   02/06/16 0647  BP: 124/68  Pulse: 71  Temp: 36.6 C  Resp: 20    Complications: No apparent anesthesia complications

## 2016-02-06 NOTE — Anesthesia Procedure Notes (Signed)
Procedure Name: Intubation Date/Time: 02/06/2016 8:32 AM Performed by: Marni GriffonJAMES, Shaunte Tuft B Pre-anesthesia Checklist: Patient identified, Emergency Drugs available, Suction available and Patient being monitored Patient Re-evaluated:Patient Re-evaluated prior to inductionOxygen Delivery Method: Circle System Utilized Preoxygenation: Pre-oxygenation with 100% oxygen Intubation Type: IV induction Ventilation: Mask ventilation without difficulty Laryngoscope Size: Mac and 3 Grade View: Grade II Tube type: Oral Tube size: 7.5 mm Number of attempts: 1 Airway Equipment and Method: Stylet Placement Confirmation: ETT inserted through vocal cords under direct vision,  positive ETCO2 and breath sounds checked- equal and bilateral Secured at: 21 (cm at teeth) cm Tube secured with: Tape Dental Injury: Teeth and Oropharynx as per pre-operative assessment

## 2016-02-06 NOTE — Anesthesia Postprocedure Evaluation (Signed)
Anesthesia Post Note  Patient: [name]  Procedure(s) Performed: Procedure(s) (LRB): RIGHT SIDED LUMBAR 4-5 TRANSFORAMINAL LUMBAR INTERBODY FUSION WITH INSTRUMENTATION AND ALLOGRAFT (Right)  Patient location during evaluation: PACU Anesthesia Type: General Level of consciousness: awake and alert Pain management: pain level controlled Vital Signs Assessment: post-procedure vital signs reviewed and stable Respiratory status: spontaneous breathing, nonlabored ventilation, respiratory function stable and patient connected to nasal cannula oxygen Cardiovascular status: blood pressure returned to baseline and stable Postop Assessment: no signs of nausea or vomiting Anesthetic complications: no    Last Vitals:  Filed Vitals:   02/06/16 1230 02/06/16 1245  BP: 136/55 136/65  Pulse: 69 70  Temp: 36.7 C   Resp: 13 16    Last Pain:  Filed Vitals:   02/06/16 1321  PainSc: 6                  Blakeley Margraf S

## 2016-02-06 NOTE — Progress Notes (Signed)
ANTIBIOTIC CONSULT NOTE - INITIAL  Pharmacy Consult:  Vancomycin Indication: surgical prophylaxis  Allergies  Allergen Reactions  . Codeine Rash    rash  . Vasotec [Enalapril] Other (See Comments)    cough  . Augmentin [Amoxicillin-Pot Clavulanate] Other (See Comments)    vaginitis  . Hytrin [Terazosin] Other (See Comments)    incontinence   . Oxycodone Rash    rash  . Procardia [Nifedipine] Other (See Comments)    edema    Patient Measurements: Height: 5\' 5"  (165.1 cm) Weight: 151 lb (68.493 kg) IBW/kg (Calculated) : 57  Vital Signs: Temp: 98.7 F (37.1 C) (03/29 1600) Temp Source: Oral (03/29 0647) BP: 161/79 mmHg (03/29 1600) Pulse Rate: 74 (03/29 1600) Intake/Output from this shift: Total I/O In: 1600 [I.V.:1600] Out: 430 [Urine:230; Blood:200]  Labs: No results for input(s): WBC, HGB, PLT, LABCREA, CREATININE in the last 72 hours. Estimated Creatinine Clearance: 34.6 mL/min (by C-G formula based on Cr of 1.24). No results for input(s): VANCOTROUGH, VANCOPEAK, VANCORANDOM, GENTTROUGH, GENTPEAK, GENTRANDOM, TOBRATROUGH, TOBRAPEAK, TOBRARND, AMIKACINPEAK, AMIKACINTROU, AMIKACIN in the last 72 hours.   Microbiology: Recent Results (from the past 720 hour(s))  Surgical pcr screen     Status: None   Collection Time: 02/01/16  1:43 PM  Result Value Ref Range Status   MRSA, PCR NEGATIVE NEGATIVE Final   Staphylococcus aureus NEGATIVE NEGATIVE Final    Comment:        The Xpert SA Assay (FDA approved for NASAL specimens in patients over 80 years of age), is one component of a comprehensive surveillance program.  Test performance has been validated by [facility] for patients greater than or equal to 80 year old. It is not intended to diagnose infection nor to guide or monitor treatment.     Medical History: Past Medical History  Diagnosis Date  . Hypertension   . Hyperlipidemia   . Anxiety   . Depression   . Thyroid disease   . Anemia   . History  of toe surgery left  . GERD (gastroesophageal reflux disease)   . Wears glasses   . Type II or unspecified type diabetes mellitus with renal manifestations, not stated as uncontrolled     stage II, does not take medication  . Constipation   . Arthritis   . History of blood transfusion   . Insomnia       Assessment: 82 YOF s/p lumbar surgery to continue vancomycin post-op for surgical prophylaxis.  Patient has a drain.  Pre-op labs reviewed and notice patient received vancomycin 1250mg  IV x 1 and Ancef 2gm IV x 1 around 0900 today.   Goal of Therapy:  Vancomycin trough level 10-15 mcg/ml   Plan:  - Vanc 750mg  IV Q24H, start tomorrow - Monitor renal fxn, clinical progress, vanc trough if indicated - BMET in AM    Raseel Jans D. Laney Potashang, PharmD, BCPS Pager:  [phone] 02/06/2016, 4:49 PM

## 2016-02-06 NOTE — Anesthesia Preprocedure Evaluation (Addendum)
Anesthesia Evaluation  Patient identified by MRN, date of birth, ID band Patient awake    Reviewed: Allergy & Precautions, NPO status , Patient's Chart, lab work & pertinent test results  Airway Mallampati: II  TM Distance: >3 FB Neck ROM: Full    Dental no notable dental hx. (+) Teeth Intact, Caps, Dental Advisory Given   Pulmonary neg pulmonary ROS,    Pulmonary exam normal breath sounds clear to auscultation       Cardiovascular hypertension, Pt. on medications and Pt. on home beta blockers Normal cardiovascular exam Rhythm:Regular Rate:Normal     Neuro/Psych negative neurological ROS  negative psych ROS   GI/Hepatic Neg liver ROS, GERD  Medicated,  Endo/Other  negative endocrine ROSdiabetes  Renal/GU negative Renal ROS  negative genitourinary   Musculoskeletal negative musculoskeletal ROS (+)   Abdominal   Peds negative pediatric ROS (+)  Hematology negative hematology ROS (+)   Anesthesia Other Findings   Reproductive/Obstetrics negative OB ROS                            Anesthesia Physical Anesthesia Plan  ASA: II  Anesthesia Plan: General   Post-op Pain Management:    Induction: Intravenous  Airway Management Planned: Oral ETT  Additional Equipment:   Intra-op Plan:   Post-operative Plan: Extubation in OR  Informed Consent: I have reviewed the patients History and Physical, chart, labs and discussed the procedure including the risks, benefits and alternatives for the proposed anesthesia with the patient or authorized representative who has indicated his/her understanding and acceptance.   Dental advisory given  Plan Discussed with: CRNA and Surgeon  Anesthesia Plan Comments:         Anesthesia Quick Evaluation

## 2016-02-06 NOTE — Progress Notes (Signed)
Report given to robin roberts rn as caregiver 

## 2016-02-07 LAB — GLUCOSE, CAPILLARY: GLUCOSE-CAPILLARY: 141 mg/dL — AB (ref 65–99)

## 2016-02-07 LAB — BASIC METABOLIC PANEL
ANION GAP: 8 (ref 5–15)
BUN: 10 mg/dL (ref 6–20)
CO2: 29 mmol/L (ref 22–32)
CREATININE: 1.13 mg/dL — AB (ref 0.44–1.00)
Calcium: 8.5 mg/dL — ABNORMAL LOW (ref 8.9–10.3)
Chloride: 104 mmol/L (ref 101–111)
GFR calc non Af Amer: 44 mL/min — ABNORMAL LOW (ref >60)
GFR, EST AFRICAN AMERICAN: 51 mL/min — AB (ref >60)
Glucose, Bld: 132 mg/dL — ABNORMAL HIGH (ref 65–99)
POTASSIUM: 3.1 mmol/L — AB (ref 3.5–5.1)
SODIUM: 141 mmol/L (ref 135–145)

## 2016-02-07 MED FILL — Heparin Sodium (Porcine) Inj 1000 Unit/ML: INTRAMUSCULAR | Qty: 30 | Status: AC

## 2016-02-07 MED FILL — Sodium Chloride IV Soln 0.9%: INTRAVENOUS | Qty: 1000 | Status: AC

## 2016-02-07 MED FILL — Thrombin For Soln 20000 Unit: CUTANEOUS | Qty: 1 | Status: AC

## 2016-02-07 NOTE — Evaluation (Signed)
Occupational Therapy Evaluation Patient Details Name: [name] MRN: [id number] DOB: 1933/04/26 Today's Date: 02/07/2016    History of Present Illness Pt is an 82 y.o. female s/p RIGHT SIDED LUMBAR 4-5 TRANSFORAMINAL LUMBAR INTERBODY FUSION. PMHx: HTN, Hyperlipidemia, Depression, Anxiety, GERD, DM 2, Arithritis, Cerebral aneurysm repair, Bil TKA.      Clinical Impression   Pt reports she was independent with ADLs PTA. Currently pt is overall min assist for ADLs and functional mobility. Began safety, back, and ADL education with pt and daughter. Pt planning to d/c home with 24/7 supervision from family. Recommending HHOT for follow up to maximize independence and safety with ADLs and functional mobility upon return home. Pt would benefit from continued skilled OT to address established goals.    Follow Up Recommendations  Home health OT;Supervision/Assistance - 24 hour    Equipment Recommendations  3 in 1 bedside comode    Recommendations for Other Services PT consult     Precautions / Restrictions Precautions Precautions: Back;Fall Precaution Booklet Issued: Yes (comment) Precaution Comments: Educated pt and daughter on precautions. Required Braces or Orthoses: Spinal Brace Spinal Brace: Thoracolumbosacral orthotic Restrictions Weight Bearing Restrictions: No      Mobility Bed Mobility Overal bed mobility: Needs Assistance Bed Mobility: Rolling;Sidelying to Sit Rolling: Min guard Sidelying to sit: Min assist (light min assist provided at trunk for balance)       General bed mobility comments: HOB flat with heavy use of bed rail. VCs throughout for hand placement and technique.  Transfers Overall transfer level: Needs assistance Equipment used: 1 person hand held assist Transfers: Sit to/from Stand Sit to Stand: Min assist         General transfer comment: Min assist to boost up and for balance in standing.    Balance Overall balance assessment: Needs  assistance Sitting-balance support: Feet supported;No upper extremity supported Sitting balance-Leahy Scale: Fair     Standing balance support: During functional activity;No upper extremity supported Standing balance-Leahy Scale: Poor                              ADL Overall ADL's : Needs assistance/impaired Eating/Feeding: Set up;Sitting   Grooming: Min guard;Standing Grooming Details (indicate cue type and reason): Educated on use of 2 cups for oral care. Upper Body Bathing: Supervision/ safety;Sitting   Lower Body Bathing: Minimal assistance;Sit to/from stand   Upper Body Dressing : Supervision/safety;Sitting   Lower Body Dressing: Minimal assistance;Sit to/from stand Lower Body Dressing Details (indicate cue type and reason): Pt able to cross foot over opposite knee. Educated on compensatory strategies for LB ADLs. Daughter to assist as needed. Toilet Transfer: Minimal assistance;Ambulation;BSC;Cueing for safety (hand held assist) Toilet Transfer Details (indicate cue type and reason): Cues to back up to toilet with legs touching Toileting- Clothing Manipulation and Hygiene: Min guard;Sit to/from stand       Functional mobility during ADLs: Minimal assistance (hand held assist) General ADL Comments: Educated pt and daughter on maintaining back precautions during functional activities, log roll technique for bed mobility, sitting up for only 45 min-1 hour at a time, donning brace.      Vision     Perception     Praxis      Pertinent Vitals/Pain Pain Assessment: Faces Faces Pain Scale: Hurts little more Pain Location: back Pain Descriptors / Indicators: Sore Pain Intervention(s): Limited activity within patient's tolerance;Monitored during session;Repositioned;Premedicated before session     Hand Dominance  Extremity/Trunk Assessment Upper Extremity Assessment Upper Extremity Assessment: Overall WFL for tasks assessed   Lower Extremity  Assessment Lower Extremity Assessment: Defer to PT evaluation   Cervical / Trunk Assessment Cervical / Trunk Assessment: Kyphotic   Communication Communication Communication: No difficulties   Cognition Arousal/Alertness: Awake/alert Behavior During Therapy: WFL for tasks assessed/performed Overall Cognitive Status: Within Functional Limits for tasks assessed       Memory: Decreased short-term memory             General Comments       Exercises       Shoulder Instructions      Home Living Family/patient expects to be discharged to:: Private residence Living Arrangements: Alone Available Help at Discharge: Family;Available 24 hours/day (daughter will stay 24/7 as long as needed) Type of Home: House Home Access: Stairs to enter Entergy CorporationEntrance Stairs-Number of Steps: 2 Entrance Stairs-Rails: None Home Layout: One level     Bathroom Shower/Tub: Producer, television/film/videoWalk-in shower   Bathroom Toilet: Standard Bathroom Accessibility: Yes How Accessible: Accessible via walker Home Equipment: Walker - 2 wheels;Cane - quad;Shower seat - built in;Grab bars - tub/shower;Adaptive equipment Adaptive Equipment: Reacher        Prior Functioning/Environment Level of Independence: Independent;Independent with assistive device(s)        Comments: Quad cane for mobility    OT Diagnosis: Generalized weakness;Acute pain   OT Problem List: Decreased strength;Decreased activity tolerance;Impaired balance (sitting and/or standing);Decreased cognition;Decreased safety awareness;Decreased knowledge of use of DME or AE;Decreased knowledge of precautions;Pain   OT Treatment/Interventions: Self-care/ADL training;Energy conservation;DME and/or AE instruction;Therapeutic activities;Patient/family education;Balance training    OT Goals(Current goals can be found in the care plan section) Acute Rehab OT Goals Patient Stated Goal: get back to being independent OT Goal Formulation: With patient/family Time For  Goal Achievement: 02/21/16 Potential to Achieve Goals: Good ADL Goals Pt Will Perform Lower Body Bathing: with supervision;sit to/from stand Pt Will Perform Lower Body Dressing: with supervision;sit to/from stand Pt Will Transfer to Toilet: with supervision;ambulating;bedside commode (BSC over toilet) Pt Will Perform Toileting - Clothing Manipulation and hygiene: with supervision;sit to/from stand Pt Will Perform Tub/Shower Transfer: Shower transfer;with supervision;ambulating;grab bars;rolling walker;shower seat Additional ADL Goal #1: Pt/caregiver will independently don/doff brace as precursor for ADLs and functional mobility. Additional ADL Goal #2: Pt will independently verbalize 3/3 back precautions and maintain throughout ADL activity.  OT Frequency: Min 2X/week   Barriers to D/C:            Co-evaluation              End of Session Equipment Utilized During Treatment: Gait belt;Back brace Nurse Communication: Mobility status;Other (comment) (needs 3 in 1, HHOT)  Activity Tolerance: Patient tolerated treatment well Patient left: in chair;with call bell/phone within reach;with family/visitor present   Time: [id number] OT Time Calculation (min): 26 min Charges:  OT General Charges $OT Visit: 1 Procedure OT Evaluation $OT Eval Moderate Complexity: 1 Procedure OT Treatments $Self Care/Home Management : 8-22 mins G-Codes:     Gaye AlkenBailey A Girolamo Lortie M.S., OTR/L Pager: [phone]  02/07/2016, 9:07 AM

## 2016-02-07 NOTE — Evaluation (Signed)
Physical Therapy Evaluation Patient Details Name: [name] MRN: [id number] DOB: [birth year] Today's Date: 02/07/2016   History of Present Illness  Pt is an 82 y.o. female s/p RIGHT SIDED LUMBAR 4-5 TRANSFORAMINAL LUMBAR INTERBODY FUSION. PMHx: HTN, Hyperlipidemia, Depression, Anxiety, GERD, DM 2, Arithritis, Cerebral aneurysm repair, Bil TKA.     Clinical Impression  Pt admitted with above diagnosis. Pt currently with functional limitations due to the deficits listed below (see PT Problem List). At the time of PT eval pt was able to perform transfers and ambulation with min guard assistance for safety. Pt and daughter were instructed in stair training and pt was able to negotiate a total of 4 stairs during session. Pt will benefit from skilled PT to increase their independence and safety with mobility to allow discharge to the venue listed below.       Follow Up Recommendations Home health PT;Supervision for mobility/OOB    Equipment Recommendations  Rolling walker with 5" wheels;3in1 (PT)    Recommendations for Other Services       Precautions / Restrictions Precautions Precautions: Back;Fall Precaution Booklet Issued: Yes (comment) Precaution Comments: Educated pt and daughter on precautions. Required Braces or Orthoses: Spinal Brace Spinal Brace: Thoracolumbosacral orthotic Restrictions Weight Bearing Restrictions: No      Mobility  Bed Mobility Overal bed mobility: Needs Assistance Bed Mobility: Rolling;Sidelying to Sit Rolling: Min guard Sidelying to sit: Min assist (light min assist provided at trunk for balance)       General bed mobility comments: Pt sitting up in recliner upon PT arrival.   Transfers Overall transfer level: Needs assistance Equipment used: 1 person hand held assist Transfers: Sit to/from Stand Sit to Stand: Min guard         General transfer comment: Close guard for safety as pt powers up to full standing position. VC's provided for hand  placement on seated surface for safety.   Ambulation/Gait Ambulation/Gait assistance: Min guard Ambulation Distance (Feet): 150 Feet Assistive device: Rolling walker (2 wheeled) Gait Pattern/deviations: Step-through pattern;Decreased stride length;Trunk flexed Gait velocity: Decreased Gait velocity interpretation: Below normal speed for age/gender General Gait Details: Pt was able to ambulate slowly but steadily in the hall with RW for support.   Stairs Stairs: Yes Stairs assistance: +2 physical assistance Stair Management: No rails;Step to pattern;Forwards Number of Stairs: 1 (x4) General stair comments: Pt with HHA bilaterally for support as she powered-up to next step. Practiced x4 times for increased comfort in performing.   Wheelchair Mobility    Modified Rankin (Stroke Patients Only)       Balance Overall balance assessment: Needs assistance Sitting-balance support: Feet supported;No upper extremity supported Sitting balance-Leahy Scale: Fair     Standing balance support: During functional activity;No upper extremity supported Standing balance-Leahy Scale: Poor Standing balance comment: Requires UE support to maintain dynamic standing balance.                              Pertinent Vitals/Pain Pain Assessment: Faces Faces Pain Scale: Hurts a little bit Pain Location: Back Pain Descriptors / Indicators: Operative site guarding;Aching Pain Intervention(s): Limited activity within patient's tolerance;Monitored during session;Repositioned    Home Living Family/patient expects to be discharged to:: Private residence Living Arrangements: Alone Available Help at Discharge: Family;Available 24 hours/day (Daughter will stay as long as needed) Type of Home: House Home Access: Stairs to enter Entrance Stairs-Rails: None Entrance Stairs-Number of Steps: 2 Home Layout: One level Home Equipment:  Cane - quad;Shower seat - built in;Grab bars - tub/shower;Adaptive  equipment      Prior Function Level of Independence: Independent;Independent with assistive device(s)         Comments: Quad cane for mobility     Hand Dominance        Extremity/Trunk Assessment   Upper Extremity Assessment: Defer to OT evaluation           Lower Extremity Assessment: Generalized weakness      Cervical / Trunk Assessment: Kyphotic  Communication   Communication: No difficulties  Cognition Arousal/Alertness: Awake/alert Behavior During Therapy: WFL for tasks assessed/performed Overall Cognitive Status: Within Functional Limits for tasks assessed       Memory: Decreased short-term memory              General Comments      Exercises        Assessment/Plan    PT Assessment Patient needs continued PT services  PT Diagnosis Difficulty walking;Acute pain   PT Problem List Decreased strength;Decreased range of motion;Decreased activity tolerance;Decreased balance;Decreased mobility;Decreased knowledge of use of DME;Decreased safety awareness;Decreased knowledge of precautions;Pain  PT Treatment Interventions DME instruction;Gait training;Stair training;Functional mobility training;Therapeutic activities;Therapeutic exercise;Neuromuscular re-education;Patient/family education   PT Goals (Current goals can be found in the Care Plan section) Acute Rehab PT Goals Patient Stated Goal: get back to being independent PT Goal Formulation: With patient/family Time For Goal Achievement: 02/14/16 Potential to Achieve Goals: Good    Frequency Min 5X/week   Barriers to discharge        Co-evaluation               End of Session Equipment Utilized During Treatment: Back brace;Gait belt Activity Tolerance: Patient tolerated treatment well Patient left: in chair;with call bell/phone within reach;with family/visitor present Nurse Communication: Mobility status         Time: [id number] PT Time Calculation (min) (ACUTE ONLY): 21  min   Charges:   PT Evaluation $PT Eval Moderate Complexity: 1 Procedure     PT G CodesConni Slipper:        Getsemani Lindon 02/07/2016, 9:48 AM   Conni SlipperLaura Chrishon Martino, PT, DPT Acute Rehabilitation Services Pager: [phone]

## 2016-02-07 NOTE — Op Note (Signed)
NAMCaesar Chestnut:  Desanctis, Tauri                 ACCOUNT NO.:  [id number]  MEDICAL RECORD NO.:  [id number]  LOCATION:  3C06C                        FACILITY:  [facility]  PHYSICIAN:  Estill BambergMark Reginold Beale, MD      DATE OF BIRTH:  1932/12/25  DATE OF PROCEDURE:  02/06/2016                              OPERATIVE REPORT   PREOPERATIVE DIAGNOSES: 1. Severe spinal stenosis L4-5. 2. High grade 1 spondylolisthesis L4-5. 3. Right-sided lumbar radiculopathy.  POSTOPERATIVE DIAGNOSES: 1. Severe spinal stenosis L4-5. 2. High grade 1 spondylolisthesis L4-5. 3. Right-sided lumbar radiculopathy.  PROCEDURE: 1. L4-5 laminectomy with bilateral partial facetectomy and     foraminotomy, requiring substantially more bone and soft tissue,     soft tissue removal than that of which is required for the fusion     portion of the procedure. 2. Right-sided L4-5 transforaminal lumbar interbody fusion. 3. Left-sided L4-5 posterolateral fusion. 4. Insertion of interbody device x1 (9 x 27 mm Concorde cage). 5. Placement of posterior instrumentation L4, L5. 6. Use of local autograft. 7. Use of morselized allograft. 8. Intraoperative use of fluoroscopy.  SURGEON:  Estill BambergMark Tashiana Lamarca, MD.  ASSISTANJason Coop:  Kayla McKenzie, PA-C.  ANESTHESIA:  General endotracheal anesthesia.  COMPLICATIONS:  None.  DISPOSITION:  Stable.  ESTIMATED BLOOD LOSS:  200 mL.  INDICATIONS FOR SURGERY:  Briefly, Ms. Thomes LollingFulk is a very pleasant [age] year old female, who did present to me with severe and ongoing debilitating pain in her right leg.  At the time of my evaluation with her, she did report that her leg pain had been ongoing for approximately 3 years. She has had multiple injections in the back, which did temporarily alleviate her pain, but her pain did continue.  She also did report weakness in the right leg.  Given the patient's ongoing pain and weakness and dysfunction, we did discuss proceeding with the procedure noted above.  The patient was fully  aware of the risks and limitations of the procedure, and did elect to proceed.  Of particular note, the patient was fully aware that she did have substantial degenerative changes above the level of her indicated surgery, above the L4-5 level. She did understand that she may have subsequent issues develop at the levels above her surgery, which may require additional surgery.  She did elect to proceed.  OPERATIVE DETAILS:  On February 06, 2016, the patient was brought to surgery and general endotracheal anesthesia was administered.  The patient was placed prone on a well-padded flat Jackson bed with a Wilson frame.  Antibiotics were given and a time-out procedure was performed. The back was prepped and draped and a midline incision was made overlying the L4-5 intervertebral space.  The fascia was incised at the midline, and the paraspinal musculature was retracted.  The lamina of L4 and L5 and the L4-5 facet joints were identified.  Significant facet joint hypertrophy was identified and removed.  Using anatomic landmarks in addition to fluoroscopy, I did cannulate the L4 and L5 pedicles using a medial to lateral cortical trajectory technique.  On the left side, I did decorticate the posterior elements and posterolateral gutter using a high-speed bur, and subsequently, I did place 5 x 35  mm screws into the L4 and L5 pedicles.  A 35 mm rod was placed and distraction was applied across the rod and caps were placed and provisionally tightened.  On the right side, I did place bone wax and the cannulated pedicles.  I then proceeded with a thorough decompression.  I did remove the spinous process of L4 and I did perform a bilateral partial facetectomy with removal of abundant ligamentum flavum, in order to decompress the lateral recesses on the right and left sides.  The foramina were also confirmed to be adequately decompressed.  I was very pleased with the decompression.  I then proceeded with the  fusion portion of the procedure.  I did remove the entire right-sided L4-5 facet joint.  With an assistant holding medial retraction of the traversing right L5 nerve, I did use a 15-blade knife to perform an annulotomy at the posterolateral aspect of the intervertebral disk.  I then proceeded with a thorough and complete L4-5 intervertebral diskectomy.  The endplates were prepared.  I then placed a series of trials.  At this point, the intervertebral space was liberally packed with autograft, in addition to allograft in the form of the DBX mix.  Additional allograft and autograft were packed into the posterolateral gutter on the left side. I then packed a 9 x 27 mm Concorde intervertebral spacer with allograft and autograft, after which point, it was tamped into position in the usual fashion.  I was very pleased with the final AP and lateral fluoroscopic images.  I then flattened out the Wilson frame and the distraction was then discontinued on the contralateral left side.  I then placed 5 x 35 mm pedicle screws on the right at L4 and at L5. Again, a 35 mm rod was placed and caps were placed and a final locking procedure was performed on the right and left sides at L4 and at L5.  I was very pleased with the final AP and lateral fluoroscopic images.  Of note, the wound was copiously irrigated with approximately 2 L of normal saline prior to placing the bone graft.  I then explored the wound for bleeding.  There were minor areas of bleeding which were controlled using bipolar electrocautery in addition to thrombin and Gelfoam.  I then placed a #15 deep Blake drain, deep to the fascia, which was secured using a 2-0 nylon stitch.  The fascia was then closed using #1 Vicryl, and the skin was closed using 2-0 Vicryl followed by 3-0 Monocryl.  Benzoin and Steri-Strips were applied followed by sterile dressing.  All instrument counts were correct at the termination of the procedure.  Of note, I  did use neurologic monitoring throughout the surgery, and there was no sustained abnormal EMG activity noted throughout the surgery.  Of note, Jason Coop was my assistant throughout surgery, and did aid in retraction, suctioning, and closure from start to finish.     Estill Bamberg, MD     MD/MEDQ  D:  02/06/2016  T:  02/07/2016  Job:  [id number]  cc:   Cherene Altes, Dr.

## 2016-02-07 NOTE — Care Management Note (Signed)
Case Management Note  Patient Details  Name: Mat Carnelaine B Hulett MRN: [id number] Date of Birth: 1933-02-03  Subjective/Objective:    82 yr old female s/p L2-3 decompression fusion.  Action/Plan:  Case manager spoke with patient and her daughter at the bedside concerning home health and DME needs. Choice was offered. Referral was called to Tamala BariMary Manley, [facility]  Liaison. Patient will have family support at discharge.  Expected Discharge Date:    01/29/16              Expected Discharge Plan:  Home w Home Health Services  In-House Referral:  NA  Discharge planning Services  CM Consult  Post Acute Care Choice:  Durable Medical Equipment, Home Health Choice offered to:  Patient  DME Arranged:  3-N-1, Walker rolling DME Agency:  [facility]  [facility]  Status of Service:  Completed, signed off  Medicare Important Message Given:    Date Medicare IM Given:    Medicare IM give by:    Date Additional Medicare IM Given:    Additional Medicare Important Message give by:     If discussed at Long Length of Stay Meetings, dates discussed:    Additional Comments:  Durenda GuthrieBrady, Shanasia Ibrahim Naomi, RN 02/07/2016, 11:54 AM

## 2016-02-07 NOTE — Progress Notes (Signed)
    Patient doing well Patient denies R or L leg pain + back discomfort   Physical Exam: Filed Vitals:   02/07/16 0012 02/07/16 0428  BP: 103/50 106/50  Pulse: 81 77  Temp: 97.7 F (36.5 C) 98.9 F (37.2 C)  Resp: 18 16   Patient looks excellent with minimal pain Dressing in place NVI  Drain output: 120cc/7 hours  POD #1 s/p L4/5 decompression and fusion, doing well  - up with PT/OT, encourage ambulation - Percocet for pain, Valium for muscle spasms - likely d/c home later today after PT. May pull drain prior to d/c depending on output.

## 2016-02-07 NOTE — Progress Notes (Signed)
Pt doing well. Pt and daughter given D/C instructions with Rx's, verbal understanding was provided. Pt's incision is clean and dry with no sign of infection. Pt's JP drain and IV were removed prior to D/C. Pt D/C'd home via wheelchair @ 1400 per MD order. Pt is stable @ D/C and has no other needs at this time. Rema FendtAshley Rube Sanchez, RN

## 2016-02-08 DIAGNOSIS — Z96653 Presence of artificial knee joint, bilateral: Secondary | ICD-10-CM | POA: Diagnosis not present

## 2016-02-08 DIAGNOSIS — Z4789 Encounter for other orthopedic aftercare: Secondary | ICD-10-CM | POA: Diagnosis not present

## 2016-02-08 DIAGNOSIS — M4806 Spinal stenosis, lumbar region: Secondary | ICD-10-CM | POA: Diagnosis not present

## 2016-02-08 DIAGNOSIS — I10 Essential (primary) hypertension: Secondary | ICD-10-CM | POA: Diagnosis not present

## 2016-02-08 DIAGNOSIS — D649 Anemia, unspecified: Secondary | ICD-10-CM | POA: Diagnosis not present

## 2016-02-08 DIAGNOSIS — E785 Hyperlipidemia, unspecified: Secondary | ICD-10-CM | POA: Diagnosis not present

## 2016-02-08 DIAGNOSIS — Z9181 History of falling: Secondary | ICD-10-CM | POA: Diagnosis not present

## 2016-02-08 DIAGNOSIS — F419 Anxiety disorder, unspecified: Secondary | ICD-10-CM | POA: Diagnosis not present

## 2016-02-08 DIAGNOSIS — Z7982 Long term (current) use of aspirin: Secondary | ICD-10-CM | POA: Diagnosis not present

## 2016-02-08 DIAGNOSIS — E119 Type 2 diabetes mellitus without complications: Secondary | ICD-10-CM | POA: Diagnosis not present

## 2016-02-08 DIAGNOSIS — F329 Major depressive disorder, single episode, unspecified: Secondary | ICD-10-CM | POA: Diagnosis not present

## 2016-02-08 DIAGNOSIS — K219 Gastro-esophageal reflux disease without esophagitis: Secondary | ICD-10-CM | POA: Diagnosis not present

## 2016-02-08 DIAGNOSIS — M1991 Primary osteoarthritis, unspecified site: Secondary | ICD-10-CM | POA: Diagnosis not present

## 2016-02-10 DIAGNOSIS — M1991 Primary osteoarthritis, unspecified site: Secondary | ICD-10-CM | POA: Diagnosis not present

## 2016-02-10 DIAGNOSIS — M4806 Spinal stenosis, lumbar region: Secondary | ICD-10-CM | POA: Diagnosis not present

## 2016-02-10 DIAGNOSIS — Z4789 Encounter for other orthopedic aftercare: Secondary | ICD-10-CM | POA: Diagnosis not present

## 2016-02-10 DIAGNOSIS — I10 Essential (primary) hypertension: Secondary | ICD-10-CM | POA: Diagnosis not present

## 2016-02-10 DIAGNOSIS — F329 Major depressive disorder, single episode, unspecified: Secondary | ICD-10-CM | POA: Diagnosis not present

## 2016-02-10 DIAGNOSIS — E119 Type 2 diabetes mellitus without complications: Secondary | ICD-10-CM | POA: Diagnosis not present

## 2016-02-11 DIAGNOSIS — Z4789 Encounter for other orthopedic aftercare: Secondary | ICD-10-CM | POA: Diagnosis not present

## 2016-02-11 DIAGNOSIS — I10 Essential (primary) hypertension: Secondary | ICD-10-CM | POA: Diagnosis not present

## 2016-02-11 DIAGNOSIS — M4806 Spinal stenosis, lumbar region: Secondary | ICD-10-CM | POA: Diagnosis not present

## 2016-02-11 DIAGNOSIS — M1991 Primary osteoarthritis, unspecified site: Secondary | ICD-10-CM | POA: Diagnosis not present

## 2016-02-11 DIAGNOSIS — E119 Type 2 diabetes mellitus without complications: Secondary | ICD-10-CM | POA: Diagnosis not present

## 2016-02-11 DIAGNOSIS — F329 Major depressive disorder, single episode, unspecified: Secondary | ICD-10-CM | POA: Diagnosis not present

## 2016-02-12 DIAGNOSIS — I10 Essential (primary) hypertension: Secondary | ICD-10-CM | POA: Diagnosis not present

## 2016-02-12 DIAGNOSIS — M1991 Primary osteoarthritis, unspecified site: Secondary | ICD-10-CM | POA: Diagnosis not present

## 2016-02-12 DIAGNOSIS — Z4789 Encounter for other orthopedic aftercare: Secondary | ICD-10-CM | POA: Diagnosis not present

## 2016-02-12 DIAGNOSIS — M4806 Spinal stenosis, lumbar region: Secondary | ICD-10-CM | POA: Diagnosis not present

## 2016-02-12 DIAGNOSIS — F329 Major depressive disorder, single episode, unspecified: Secondary | ICD-10-CM | POA: Diagnosis not present

## 2016-02-12 DIAGNOSIS — E119 Type 2 diabetes mellitus without complications: Secondary | ICD-10-CM | POA: Diagnosis not present

## 2016-02-13 DIAGNOSIS — F329 Major depressive disorder, single episode, unspecified: Secondary | ICD-10-CM | POA: Diagnosis not present

## 2016-02-13 DIAGNOSIS — M1991 Primary osteoarthritis, unspecified site: Secondary | ICD-10-CM | POA: Diagnosis not present

## 2016-02-13 DIAGNOSIS — Z4789 Encounter for other orthopedic aftercare: Secondary | ICD-10-CM | POA: Diagnosis not present

## 2016-02-13 DIAGNOSIS — E119 Type 2 diabetes mellitus without complications: Secondary | ICD-10-CM | POA: Diagnosis not present

## 2016-02-13 DIAGNOSIS — M4806 Spinal stenosis, lumbar region: Secondary | ICD-10-CM | POA: Diagnosis not present

## 2016-02-13 DIAGNOSIS — I10 Essential (primary) hypertension: Secondary | ICD-10-CM | POA: Diagnosis not present

## 2016-02-14 ENCOUNTER — Encounter (HOSPITAL_COMMUNITY): Payer: Self-pay | Admitting: Emergency Medicine

## 2016-02-14 ENCOUNTER — Emergency Department (HOSPITAL_COMMUNITY)
Admission: EM | Admit: 2016-02-14 | Discharge: 2016-02-14 | Disposition: A | Discharge status: Home / Self Care | Payer: Medicare Other | Attending: Emergency Medicine | Admitting: Emergency Medicine

## 2016-02-14 DIAGNOSIS — F419 Anxiety disorder, unspecified: Secondary | ICD-10-CM | POA: Diagnosis not present

## 2016-02-14 DIAGNOSIS — N182 Chronic kidney disease, stage 2 (mild): Secondary | ICD-10-CM | POA: Diagnosis not present

## 2016-02-14 DIAGNOSIS — Z862 Personal history of diseases of the blood and blood-forming organs and certain disorders involving the immune mechanism: Secondary | ICD-10-CM | POA: Diagnosis not present

## 2016-02-14 DIAGNOSIS — G4700 Insomnia, unspecified: Secondary | ICD-10-CM | POA: Insufficient documentation

## 2016-02-14 DIAGNOSIS — I10 Essential (primary) hypertension: Secondary | ICD-10-CM | POA: Diagnosis not present

## 2016-02-14 DIAGNOSIS — R42 Dizziness and giddiness: Secondary | ICD-10-CM | POA: Insufficient documentation

## 2016-02-14 DIAGNOSIS — M1991 Primary osteoarthritis, unspecified site: Secondary | ICD-10-CM | POA: Diagnosis not present

## 2016-02-14 DIAGNOSIS — F329 Major depressive disorder, single episode, unspecified: Secondary | ICD-10-CM | POA: Insufficient documentation

## 2016-02-14 DIAGNOSIS — R030 Elevated blood-pressure reading, without diagnosis of hypertension: Secondary | ICD-10-CM | POA: Diagnosis not present

## 2016-02-14 DIAGNOSIS — E079 Disorder of thyroid, unspecified: Secondary | ICD-10-CM | POA: Insufficient documentation

## 2016-02-14 DIAGNOSIS — I129 Hypertensive chronic kidney disease with stage 1 through stage 4 chronic kidney disease, or unspecified chronic kidney disease: Secondary | ICD-10-CM | POA: Diagnosis not present

## 2016-02-14 DIAGNOSIS — Z4789 Encounter for other orthopedic aftercare: Secondary | ICD-10-CM | POA: Diagnosis not present

## 2016-02-14 DIAGNOSIS — E119 Type 2 diabetes mellitus without complications: Secondary | ICD-10-CM | POA: Diagnosis not present

## 2016-02-14 DIAGNOSIS — K5900 Constipation, unspecified: Secondary | ICD-10-CM | POA: Insufficient documentation

## 2016-02-14 DIAGNOSIS — E1129 Type 2 diabetes mellitus with other diabetic kidney complication: Secondary | ICD-10-CM | POA: Diagnosis not present

## 2016-02-14 DIAGNOSIS — Z7982 Long term (current) use of aspirin: Secondary | ICD-10-CM | POA: Insufficient documentation

## 2016-02-14 DIAGNOSIS — M1990 Unspecified osteoarthritis, unspecified site: Secondary | ICD-10-CM | POA: Insufficient documentation

## 2016-02-14 DIAGNOSIS — Z79899 Other long term (current) drug therapy: Secondary | ICD-10-CM | POA: Diagnosis not present

## 2016-02-14 DIAGNOSIS — E785 Hyperlipidemia, unspecified: Secondary | ICD-10-CM | POA: Diagnosis not present

## 2016-02-14 DIAGNOSIS — M4806 Spinal stenosis, lumbar region: Secondary | ICD-10-CM | POA: Diagnosis not present

## 2016-02-14 LAB — URINALYSIS, ROUTINE W REFLEX MICROSCOPIC
Bilirubin Urine: NEGATIVE
Glucose, UA: NEGATIVE mg/dL
Hgb urine dipstick: NEGATIVE
Ketones, ur: NEGATIVE mg/dL
Leukocytes, UA: NEGATIVE
Nitrite: NEGATIVE
Protein, ur: NEGATIVE mg/dL
Specific Gravity, Urine: 1.006 (ref 1.005–1.030)
pH: 7.5 (ref 5.0–8.0)

## 2016-02-14 LAB — CBC WITH DIFFERENTIAL/PLATELET
BASOS ABS: 0.1 10*3/uL (ref 0.0–0.1)
Basophils Relative: 1 %
EOS ABS: 0.3 10*3/uL (ref 0.0–0.7)
Eosinophils Relative: 4 %
HCT: 30.2 % — ABNORMAL LOW (ref 36.0–46.0)
Hemoglobin: 9.9 g/dL — ABNORMAL LOW (ref 12.0–15.0)
LYMPHS ABS: 1.8 10*3/uL (ref 0.7–4.0)
Lymphocytes Relative: 24 %
MCH: 30.8 pg (ref 26.0–34.0)
MCHC: 32.8 g/dL (ref 30.0–36.0)
MCV: 94.1 fL (ref 78.0–100.0)
MONO ABS: 0.8 10*3/uL (ref 0.1–1.0)
Monocytes Relative: 10 %
Neutro Abs: 4.7 10*3/uL (ref 1.7–7.7)
Neutrophils Relative %: 61 %
PLATELETS: 462 10*3/uL — AB (ref 150–400)
RBC: 3.21 MIL/uL — AB (ref 3.87–5.11)
RDW: 13.3 % (ref 11.5–15.5)
WBC: 7.7 10*3/uL (ref 4.0–10.5)

## 2016-02-14 LAB — COMPREHENSIVE METABOLIC PANEL
ALBUMIN: 3.4 g/dL — AB (ref 3.5–5.0)
ALT: 68 U/L — ABNORMAL HIGH (ref 14–54)
ANION GAP: 9 (ref 5–15)
AST: 54 U/L — AB (ref 15–41)
Alkaline Phosphatase: 237 U/L — ABNORMAL HIGH (ref 38–126)
BUN: 10 mg/dL (ref 6–20)
CHLORIDE: 102 mmol/L (ref 101–111)
CO2: 30 mmol/L (ref 22–32)
Calcium: 9.1 mg/dL (ref 8.9–10.3)
Creatinine, Ser: 0.83 mg/dL (ref 0.44–1.00)
GFR calc Af Amer: >60 mL/min (ref >60)
GFR calc non Af Amer: >60 mL/min (ref >60)
GLUCOSE: 110 mg/dL — AB (ref 65–99)
POTASSIUM: 3.7 mmol/L (ref 3.5–5.1)
Sodium: 141 mmol/L (ref 135–145)
Total Bilirubin: 0.4 mg/dL (ref 0.3–1.2)
Total Protein: 6.6 g/dL (ref 6.5–8.1)

## 2016-02-14 LAB — I-STAT TROPONIN, ED: TROPONIN I, POC: 0.01 ng/mL (ref 0.00–0.08)

## 2016-02-14 NOTE — Discharge Instructions (Signed)
You have been seen today for high blood pressure. Your lab tests showed no abnormalities. Follow up with PCP as soon as possible for reassessment and chronic management of this issue. Return to ED should symptoms worsen.

## 2016-02-14 NOTE — ED Provider Notes (Signed)
CSN: [id number]     Arrival date & time 02/14/16  1320 History   First MD Initiated Contact with Patient 02/14/16 1436     Chief Complaint  Patient presents with  . Hypertension     (Consider location/radiation/quality/duration/timing/severity/associated sxs/prior Treatment) HPI   [name] is a 82 y.o. female, with a history of Hypertension, anemia, and DM, presenting to the ED with A reported having blood pressure that occurred today. Patient also complains of some lightheadedness that began this morning. Patient is being cared for at a rehabilitation facility while she recovers from spinal fusion surgery that occurred 1 week ago. Patient states that she usually takes her blood pressure every other day, but has not taken her pressure in the last week. Patient takes atenolol and losartan, which she took as directed this morning. Patient denies recent illness, chest pain, shortness of breath, nausea/vomiting, fever/chills, or any other complaints.    Past Medical History  Diagnosis Date  . Hypertension   . Hyperlipidemia   . Anxiety   . Depression   . Thyroid disease   . Anemia   . History of toe surgery left  . GERD (gastroesophageal reflux disease)   . Wears glasses   . Type II or unspecified type diabetes mellitus with renal manifestations, not stated as uncontrolled     stage II, does not take medication  . Constipation   . Arthritis   . History of blood transfusion   . Insomnia    Past Surgical History  Procedure Laterality Date  . Abdominal hysterectomy    . Tonsillectomy    . Appendectomy    . Cerebral aneurysm repair  09/2007    coil ablation  . Joint replacement Left 2012    left total knee  . Total knee arthroplasty Right 2005    right  . Total knee revision Right 2007    right  . Hammer toe surgery  2008    osteotomy=left foot  . Ganglion cyst excision Left 2008    lt ring finger  . Cholecystectomy  2012    lap choli  . Colonoscopy    . Shoulder  arthroscopy with rotator cuff repair and subacromial decompression Right 01/29/2015    Procedure: RIGHT SHOULDER ARTHROSCOPY WITH DEBRIDEMENT ROTATOR CUFF TEAR, SUBACROMIAL DECOMPRESSION, DISTAL CLAVICAL RESECTION;  Surgeon: Jones BroomJustin Chandler, MD;  Location: [facility];  Service: Orthopedics;  Laterality: Right;  Right shoulder arthroscopy debridement rotatoro cuff tear, subacromial decompression, distal clavical resection   Family History  Problem Relation Age of Onset  . Cancer Mother     breast cancer  . Hypertension Mother   . Mental illness Mother     alz  . Cancer Father     colon cancer  . Hypertension Father   . Cancer Maternal Aunt     breast cancer with mets   Social History  Substance Use Topics  . Smoking status: Never Smoker   . Smokeless tobacco: Never Used  . Alcohol Use: Yes     Comment: rare   OB History    No data available     Review of Systems  Constitutional: Negative for fever and chills.  Eyes: Negative for visual disturbance.  Respiratory: Negative for shortness of breath.   Cardiovascular: Negative for chest pain.       Hypertension  Gastrointestinal: Negative for nausea and vomiting.  Neurological: Positive for light-headedness. Negative for headaches.  All other systems reviewed and are negative.  Allergies  Codeine; Vasotec; Augmentin; Hytrin; Oxycodone; and Procardia  Home Medications   Prior to Admission medications   Medication Sig Start Date End Date Taking? Authorizing Provider  aspirin 81 MG tablet Take 81 mg by mouth daily.   Yes Historical Provider, MD  atenolol (TENORMIN) 100 MG tablet take 1 tablet by mouth once daily 01/15/16  Yes Courtney Forcucci, PA-C  atorvastatin (LIPITOR) 80 MG tablet take 1 tablet by mouth once daily for cholesterol 11/29/15  Yes [name], MD  Biotin 10 MG CAPS Take 1 tablet by mouth daily.    Yes Historical Provider, MD  cholecalciferol (VITAMIN D) 1000 UNITS tablet Take 1,000 Units  by mouth daily.   Yes Historical Provider, MD  citalopram (CELEXA) 40 MG tablet take 1 tablet by mouth once daily 10/16/15  Yes [name], MD  diazepam (VALIUM) 5 MG tablet Take 5 mg by mouth every 6 (six) hours as needed for anxiety or muscle spasms.   Yes Historical Provider, MD  furosemide (LASIX) 40 MG tablet Take 1 tablet by mouth  daily 04/05/15  Yes [name], MD  gabapentin (NEURONTIN) 300 MG capsule take 1 capsule by mouth once daily at bedtime 12/12/15  Yes Historical Provider, MD  losartan (COZAAR) 100 MG tablet take 1 tablet by mouth once daily 11/29/15  Yes [name], MD  Magnesium 400 MG CAPS Take 1 capsule by mouth daily.    Yes Historical Provider, MD  mexiletine (MEXITIL) 150 MG capsule Take 1 capsule (150 mg total) by mouth 3 (three) times daily. Patient taking differently: Take 150 mg by mouth daily.  07/12/15  Yes [name], MD  Multiple Vitamins-Minerals (MULTIVITAMIN WITH MINERALS) tablet Take 1 tablet by mouth daily.   Yes Historical Provider, MD  oxyCODONE-acetaminophen (PERCOCET/ROXICET) 5-325 MG tablet Take 1 tablet by mouth every 6 (six) hours as needed for severe pain.   Yes Historical Provider, MD  SYNTHROID 100 MCG tablet take 1 tablet by mouth once daily 01/30/16  Yes [name], MD  verapamil (VERELAN PM) 120 MG 24 hr capsule take 1 capsule by mouth twice a day with food for blood pressure Patient taking differently: take 1 capsule by mouth daily 11/29/15  Yes [name], MD  cloNIDine (CATAPRES) 0.1 MG tablet Take 1 tablet (0.1 mg total) by mouth 2 (two) times daily. 02/07/15 02/07/16  Courtney Forcucci, PA-C  docusate sodium (COLACE) 100 MG capsule Take 1 capsule (100 mg total) by mouth 3 (three) times daily as needed. Patient taking differently: Take 100 mg by mouth 3 (three) times daily as needed for mild constipation.  01/29/15   Jiles Harold, PA-C  glucose blood test strip Check blood sugar 1 time daily. 10/09/15   [name], MD   traZODone (DESYREL) 100 MG tablet take 3 tablets by mouth at bedtime Patient not taking: Reported on 02/14/2016 01/15/16   Courtney Forcucci, PA-C   BP 128/79 mmHg  Pulse 67  Resp 14  SpO2 93% Physical Exam  Constitutional: She is oriented to person, place, and time. She appears well-developed and well-nourished. No distress.  HENT:  Head: Normocephalic and atraumatic.  Eyes: Conjunctivae and EOM are normal. Pupils are equal, round, and reactive to light.  Neck: Normal range of motion. Neck supple.  Cardiovascular: Normal rate, regular rhythm, normal heart sounds and intact distal pulses.   Pulmonary/Chest: Effort normal and breath sounds normal. No respiratory distress.  Abdominal: Soft. There is no tenderness. There is no guarding.  Musculoskeletal: She exhibits no edema or  tenderness.  Lymphadenopathy:    She has no cervical adenopathy.  Neurological: She is alert and oriented to person, place, and time. She has normal reflexes.  No sensory deficits. Strength 5/5 in all extremities. Gait testing deferred. Coordination intact. Cranial nerves III-XII grossly intact. No facial droop.   Skin: Skin is warm and dry. She is not diaphoretic.  Psychiatric: She has a normal mood and affect. Her behavior is normal.  Nursing note and vitals reviewed.   ED Course  Procedures (including critical care time) Labs Review Labs Reviewed  CBC WITH DIFFERENTIAL/PLATELET - Abnormal; Notable for the following:    RBC 3.21 (*)    Hemoglobin 9.9 (*)    HCT 30.2 (*)    Platelets 462 (*)    All other components within normal limits  COMPREHENSIVE METABOLIC PANEL - Abnormal; Notable for the following:    Glucose, Bld 110 (*)    Albumin 3.4 (*)    AST 54 (*)    ALT 68 (*)    Alkaline Phosphatase 237 (*)    All other components within normal limits  URINALYSIS, ROUTINE W REFLEX MICROSCOPIC (NOT AT [facility])  I-STAT TROPOININ, ED   HEMOGLOBIN  Date Value Ref Range Status  02/14/2016 9.9* 12.0 - 15.0  g/dL Final  16/08/9603 54.0 12.0 - 15.0 g/dL Final  98/09/9146 82.9* 12.0 - 15.0 g/dL Final  56/21/3086 57.8* 12.0 - 15.0 g/dL Final    Imaging Review No results found. I have personally reviewed and evaluated these images and lab results as part of my medical decision-making.   EKG Interpretation   Date/Time:  Thursday February 14 2016 13:45:16 EDT Ventricular Rate:  61 PR Interval:  174 QRS Duration: 92 QT Interval:  449 QTC Calculation: 452 R Axis:   44 Text Interpretation:  Sinus rhythm No significant change since last  tracing Confirmed by NGUYEN, EMILY ([id number]) on 02/14/2016 3:49:09 PM      MDM   Final diagnoses:  Essential hypertension    [name] presents with hypertension and some minor lightheadedness that began this morning.  Findings and plan of care discussed with Leta Baptist, MD. Dr. Cyndie Chime agreed to personally evaluate and examine this patient.  I suspect that it is possible that the patient's blood pressure has been higher than normal for the last week or so, however, today was first day that she measured her blood pressure. Labs obtained to assess things such as kidney function. Pt has no neuro or functional deficits. No signs of hypertensive emergency.  4:16 PM End of shift patient care handoff report given to Noelle Penner, PA-C. Plan: Assess patient's labs, and if normal, and patient feels normal, discharge back to rehab facility with instructions to follow up with PCP for possible medication adjustment. Orthostatics still outstanding. Hb is about 1 unit below previous lowest values. This may just require follow up with PCP.    Filed Vitals:   02/14/16 1324 02/14/16 1347 02/14/16 1620  BP:  184/94 128/79  Pulse:  64 67  Resp:  21 14  SpO2: 91% 94% 93%       Anselm Pancoast, PA-C 02/14/16 1628  Leta Baptist, MD 02/15/16 2234

## 2016-02-14 NOTE — ED Provider Notes (Signed)
Care assumed from [place] Springs[place]awn Joy, [place]C at end of shift. [name] is an 82 y.o. female who presents to the ED for evaluation of elevated BP. Pt apparently had spinal fusion therapy last week and was at home today when her home rehab nurse noted her BP to be elevated to 190/100. After one hour they re-checked her BP and it was still elevated. On EMS arrival pt's BP was close to 200/100. Per pt, her BP is closer to 130/80 at PCP. She does have a history of HTN and is on atenolol, lasix, losartan, and verapamil. Pt states she has felt well until this morning when she did feel a bit "off." She is unable to describe in detail what she means. She denies headache, blurry vision, n/v/d, urinary issues, chest pain, SOB. At this point workup is unremarkable. BP is slightly elevated though improved from earlier. Dispo is pending pt's symptoms. If she feels improved, ambulates asymptomatically and BP remains stable will d/c home with close PCP f/u.   Physical Exam  BP 128/79 mmHg  Pulse 67  Resp 14  SpO2 93%  Physical Exam  Constitutional: She is oriented to person, place, and time. No distress.  HENT:  Head: Atraumatic.  Right Ear: External ear normal.  Left Ear: External ear normal.  Nose: Nose normal.  Eyes: Conjunctivae are normal. No scleral icterus.  Neck: Normal range of motion. Neck supple.  Cardiovascular: Normal rate, regular rhythm and normal heart sounds.   Pulmonary/Chest: Effort normal and breath sounds normal. No respiratory distress. She has no wheezes. She exhibits no tenderness.  Abdominal: Soft. She exhibits no distension. There is no tenderness.  Neurological: She is alert and oriented to person, place, and time.  Skin: Skin is warm and dry. She is not diaphoretic.  Psychiatric: She has a normal mood and affect. Her behavior is normal.  Nursing note and vitals reviewed.    Orthostatic VS for the past 24 hrs:  BP- Lying Pulse- Lying BP- Sitting Pulse- Sitting BP- Standing at 0  minutes Pulse- Standing at 0 minutes  02/14/16 1638 167/74 mmHg 63 169/86 mmHg 69 167/84 mmHg 69   Filed Vitals:   02/14/16 1347 02/14/16 1620 02/14/16 1700 02/14/16 1730  BP: 184/94 128/79 160/67 157/83  Pulse: 64 67 67 67  Resp: 21 14 20 17   SpO2: 94% 93% 94% 94%      ED Course  Procedures   Results for orders placed or performed during the hospital encounter of 02/14/16  Urinalysis, Routine w reflex microscopic (not at [facility])  Result Value Ref Range   Color, Urine YELLOW YELLOW   APPearance CLEAR CLEAR   Specific Gravity, Urine 1.006 1.005 - 1.030   pH 7.5 5.0 - 8.0   Glucose, UA NEGATIVE NEGATIVE mg/dL   Hgb urine dipstick NEGATIVE NEGATIVE   Bilirubin Urine NEGATIVE NEGATIVE   Ketones, ur NEGATIVE NEGATIVE mg/dL   Protein, ur NEGATIVE NEGATIVE mg/dL   Nitrite NEGATIVE NEGATIVE   Leukocytes, UA NEGATIVE NEGATIVE  CBC with Differential  Result Value Ref Range   WBC 7.7 4.0 - 10.5 K/uL   RBC 3.21 (L) 3.87 - 5.11 MIL/uL   Hemoglobin 9.9 (L) 12.0 - 15.0 g/dL   HCT 16.130.2 (L) 09.636.0 - 04.546.0 %   MCV 94.1 78.0 - 100.0 fL   MCH 30.8 26.0 - 34.0 pg   MCHC 32.8 30.0 - 36.0 g/dL   RDW 40.913.3 81.111.5 - 91.415.5 %   Platelets 462 (H) 150 - 400 K/uL  Neutrophils Relative % 61 %   Lymphocytes Relative 24 %   Monocytes Relative 10 %   Eosinophils Relative 4 %   Basophils Relative 1 %   Neutro Abs 4.7 1.7 - 7.7 K/uL   Lymphs Abs 1.8 0.7 - 4.0 K/uL   Monocytes Absolute 0.8 0.1 - 1.0 K/uL   Eosinophils Absolute 0.3 0.0 - 0.7 K/uL   Basophils Absolute 0.1 0.0 - 0.1 K/uL   RBC Morphology POLYCHROMASIA PRESENT   Comprehensive metabolic panel  Result Value Ref Range   Sodium 141 135 - 145 mmol/L   Potassium 3.7 3.5 - 5.1 mmol/L   Chloride 102 101 - 111 mmol/L   CO2 30 22 - 32 mmol/L   Glucose, Bld 110 (H) 65 - 99 mg/dL   BUN 10 6 - 20 mg/dL   Creatinine, Ser 1.19 0.44 - 1.00 mg/dL   Calcium 9.1 8.9 - 14.7 mg/dL   Total Protein 6.6 6.5 - 8.1 g/dL   Albumin 3.4 (L) 3.5 - 5.0 g/dL   AST  54 (H) 15 - 41 U/L   ALT 68 (H) 14 - 54 U/L   Alkaline Phosphatase 237 (H) 38 - 126 U/L   Total Bilirubin 0.4 0.3 - 1.2 mg/dL   GFR calc non Af Amer >60 >60 mL/min   GFR calc Af Amer >60 >60 mL/min   Anion gap 9 5 - 15  I-Stat Troponin, ED (not at [facility])  Result Value Ref Range   Troponin i, poc 0.01 0.00 - 0.08 ng/mL   Comment 3              MDM Pt ambulated asymptomatically with no VS instability. Orthostatics negative. BP mildly elevated though improved. Kidney function normal and baseline. Labs and workup unrevealing. No indication to emergently lower BP, no indication for further workup. Instructed close f/u with PCP. ER return precautions given.      Carlene Coria, PA-C 02/14/16 1854  Alvira Monday, MD 02/18/16 [phone]

## 2016-02-14 NOTE — ED Notes (Signed)
Per EMS with c/o HTN. home health nurse assessed BP 190's even post HTN medications. Pt states she has been seeing floaters and fatigue. Denies LOC. A/O NO pain.

## 2016-02-15 DIAGNOSIS — E119 Type 2 diabetes mellitus without complications: Secondary | ICD-10-CM | POA: Diagnosis not present

## 2016-02-15 DIAGNOSIS — Z4789 Encounter for other orthopedic aftercare: Secondary | ICD-10-CM | POA: Diagnosis not present

## 2016-02-15 DIAGNOSIS — M4806 Spinal stenosis, lumbar region: Secondary | ICD-10-CM | POA: Diagnosis not present

## 2016-02-15 DIAGNOSIS — M1991 Primary osteoarthritis, unspecified site: Secondary | ICD-10-CM | POA: Diagnosis not present

## 2016-02-15 DIAGNOSIS — I10 Essential (primary) hypertension: Secondary | ICD-10-CM | POA: Diagnosis not present

## 2016-02-15 DIAGNOSIS — F329 Major depressive disorder, single episode, unspecified: Secondary | ICD-10-CM | POA: Diagnosis not present

## 2016-02-19 ENCOUNTER — Other Ambulatory Visit (HOSPITAL_COMMUNITY): Payer: Self-pay | Admitting: Orthopedic Surgery

## 2016-02-19 ENCOUNTER — Ambulatory Visit (HOSPITAL_COMMUNITY)
Admission: RE | Admit: 2016-02-19 | Discharge: 2016-02-19 | Disposition: A | Discharge status: Home / Self Care | Payer: Medicare Other | Source: Ambulatory Visit | Attending: Internal Medicine | Admitting: Internal Medicine

## 2016-02-19 DIAGNOSIS — Z9889 Other specified postprocedural states: Secondary | ICD-10-CM | POA: Diagnosis not present

## 2016-02-19 DIAGNOSIS — M4806 Spinal stenosis, lumbar region: Secondary | ICD-10-CM | POA: Diagnosis not present

## 2016-02-19 DIAGNOSIS — M79604 Pain in right leg: Secondary | ICD-10-CM

## 2016-02-19 DIAGNOSIS — M1991 Primary osteoarthritis, unspecified site: Secondary | ICD-10-CM | POA: Diagnosis not present

## 2016-02-19 DIAGNOSIS — M7989 Other specified soft tissue disorders: Secondary | ICD-10-CM | POA: Diagnosis not present

## 2016-02-19 DIAGNOSIS — I10 Essential (primary) hypertension: Secondary | ICD-10-CM | POA: Diagnosis not present

## 2016-02-19 DIAGNOSIS — E079 Disorder of thyroid, unspecified: Secondary | ICD-10-CM | POA: Insufficient documentation

## 2016-02-19 DIAGNOSIS — F329 Major depressive disorder, single episode, unspecified: Secondary | ICD-10-CM | POA: Insufficient documentation

## 2016-02-19 DIAGNOSIS — K219 Gastro-esophageal reflux disease without esophagitis: Secondary | ICD-10-CM | POA: Diagnosis not present

## 2016-02-19 DIAGNOSIS — E119 Type 2 diabetes mellitus without complications: Secondary | ICD-10-CM | POA: Diagnosis not present

## 2016-02-19 DIAGNOSIS — E785 Hyperlipidemia, unspecified: Secondary | ICD-10-CM | POA: Insufficient documentation

## 2016-02-19 DIAGNOSIS — F419 Anxiety disorder, unspecified: Secondary | ICD-10-CM | POA: Insufficient documentation

## 2016-02-19 DIAGNOSIS — M79601 Pain in right arm: Secondary | ICD-10-CM

## 2016-02-19 DIAGNOSIS — Z4789 Encounter for other orthopedic aftercare: Secondary | ICD-10-CM | POA: Diagnosis not present

## 2016-02-19 NOTE — Progress Notes (Signed)
*  PRELIMINARY RESULTS* Vascular Ultrasound Right lower extremity venous duplex has been completed.  Preliminary findings: No evidence of DVT or baker's cyst.   Called results to Fayrene FearingJames.   Farrel DemarkJill Eunice, RDMS, RVT  02/19/2016, 2:23 PM

## 2016-02-20 ENCOUNTER — Ambulatory Visit: Payer: Self-pay | Admitting: Internal Medicine

## 2016-02-20 ENCOUNTER — Ambulatory Visit (INDEPENDENT_AMBULATORY_CARE_PROVIDER_SITE_OTHER): Payer: Medicare Other | Admitting: Internal Medicine

## 2016-02-20 VITALS — BP 122/80 | HR 64 | Temp 97.2°F | Resp 16

## 2016-02-20 DIAGNOSIS — Z981 Arthrodesis status: Secondary | ICD-10-CM

## 2016-02-20 DIAGNOSIS — I10 Essential (primary) hypertension: Secondary | ICD-10-CM | POA: Diagnosis not present

## 2016-02-20 MED ORDER — GABAPENTIN 100 MG PO CAPS: ORAL_CAPSULE | ORAL | Status: DC

## 2016-02-20 MED ORDER — GABAPENTIN 300 MG PO CAPS: 300.0000 mg | ORAL_CAPSULE | Freq: Every day | ORAL | Status: DC

## 2016-02-20 NOTE — Discharge Summary (Signed)
Patient ID: [name] MRN: [id number] DOB/AGE: August 05, 1933 82 y.o.  Admit date: 02/06/2016 Discharge date: 02/07/2016  Admission Diagnoses:  Active Problems:   Radiculopathy   Discharge Diagnoses:  Same  Past Medical History  Diagnosis Date  . Hypertension   . Hyperlipidemia   . Anxiety   . Depression   . Thyroid disease   . Anemia   . History of toe surgery left  . GERD (gastroesophageal reflux disease)   . Wears glasses   . Type II or unspecified type diabetes mellitus with renal manifestations, not stated as uncontrolled     stage II, does not take medication  . Constipation   . Arthritis   . History of blood transfusion   . Insomnia     Surgeries: Procedure(s): RIGHT SIDED LUMBAR 4-5 TRANSFORAMINAL LUMBAR INTERBODY FUSION WITH INSTRUMENTATION AND ALLOGRAFT on 02/06/2016   Consultants:  None  Discharged Condition: Improved  Hospital Course: [name] is an 82 y.o. female who was admitted 02/06/2016 for operative treatment of spinal stenosis . Patient has severe unremitting pain that affects sleep, daily activities, and work/hobbies. After pre-op clearance the patient was taken to the operating room on 02/06/2016 and underwent  Procedure(s): RIGHT SIDED LUMBAR 4-5 TRANSFORAMINAL LUMBAR INTERBODY FUSION WITH INSTRUMENTATION AND ALLOGRAFT.    Patient was given perioperative antibiotics:  Anti-infectives    Start     Dose/Rate Route Frequency Ordered Stop   02/07/16 0900  vancomycin (VANCOCIN) IVPB 750 mg/150 ml premix  Status:  Discontinued     750 mg 150 mL/hr over 60 Minutes Intravenous Every 24 hours 02/06/16 1650 02/07/16 1739   02/06/16 0815  ceFAZolin (ANCEF) IVPB 2g/100 mL premix     2 g 200 mL/hr over 30 Minutes Intravenous To [facility] 02/05/16 1158 02/06/16 0850   02/06/16 0815  vancomycin (VANCOCIN) 1,250 mg in sodium chloride 0.9 % 250 mL IVPB     1,250 mg 166.7 mL/hr over 90 Minutes Intravenous To [facility] 02/05/16 1158  02/06/16 1022       Patient was given sequential compression devices, early ambulation to prevent DVT.  Patient benefited maximally from hospital stay and there were no complications.    Recent vital signs: BP 115/54 mmHg  Pulse 76  Temp(Src) 98.5 F (36.9 C) (Oral)  Resp 16  Ht  (1.651 m)  Wt 68.493 kg (151 lb)  BMI 25.13 kg/m2  SpO2 92%  Discharge Medications:     Medication List    STOP taking these medications        omeprazole 20 MG capsule  Commonly known as:  PRILOSEC      TAKE these medications        aspirin 81 MG tablet  Take 81 mg by mouth daily.     atenolol 100 MG tablet  Commonly known as:  TENORMIN  take 1 tablet by mouth once daily     atorvastatin 80 MG tablet  Commonly known as:  LIPITOR  take 1 tablet by mouth once daily for cholesterol     Biotin 10 MG Caps  Take 1 tablet by mouth daily.     cholecalciferol 1000 units tablet  Commonly known as:  VITAMIN D  Take 1,000 Units by mouth daily.     citalopram 40 MG tablet  Commonly known as:  CELEXA  take 1 tablet by mouth once daily     cloNIDine 0.1 MG tablet  Commonly known as:  CATAPRES  Take 1 tablet (0.1  mg total) by mouth 2 (two) times daily.     docusate sodium 100 MG capsule  Commonly known as:  COLACE  Take 1 capsule (100 mg total) by mouth 3 (three) times daily as needed.     furosemide 40 MG tablet  Commonly known as:  LASIX  Take 1 tablet by mouth  daily     gabapentin 300 MG capsule  Commonly known as:  NEURONTIN  take 1 capsule by mouth once daily at bedtime     glucose blood test strip  Check blood sugar 1 time daily.     losartan 100 MG tablet  Commonly known as:  COZAAR  take 1 tablet by mouth once daily     Magnesium 400 MG Caps  Take 1 capsule by mouth daily.     mexiletine 150 MG capsule  Commonly known as:  MEXITIL  Take 1 capsule (150 mg total) by mouth 3 (three) times daily.     multivitamin with minerals tablet  Take 1 tablet by mouth daily.      SYNTHROID 100 MCG tablet  Generic drug:  levothyroxine  take 1 tablet by mouth once daily     traZODone 100 MG tablet  Commonly known as:  DESYREL  take 3 tablets by mouth at bedtime     verapamil 120 MG 24 hr capsule  Commonly known as:  VERELAN PM  take 1 capsule by mouth twice a day with food for blood pressure        Diagnostic Studies: Dg Chest 2 View  02/01/2016  CLINICAL DATA:  Lumbar spine surgery. EXAM: CHEST  2 VIEW COMPARISON:  03/09/2013. FINDINGS: Mediastinum and hilar structures are normal. Lungs are clear. Heart size normal. No pleural effusion or pneumothorax. Left base subsegmental atelectasis and/or scarring. No acute bony abnormality. Surgical clips right upper quadrant. Thoracic spine scoliosis. IMPRESSION: 1. Left base subsegmental atelectasis and or scarring. No acute cardiopulmonary disease. 2. Cardiomegaly. Electronically Signed   By: Maisie Fus  Register   On: 02/01/2016 15:50   Dg Lumbar Spine 2-3 Views  02/06/2016  CLINICAL DATA:  L4-5 TLIF. EXAM: DG C-ARM 61-120 MIN; LUMBAR SPINE - 2-3 VIEW COMPARISON:  Intraoperative radiograph same date. Lumbar MRI 01/20/2016. FINDINGS: Spot PA and lateral fluoroscopic images of the lower lumbar spine demonstrate postsurgical changes at L4-5 with bilateral pedicle screws, interconnecting rods and interbody spacers. The hardware appears satisfactorily positioned. There is a stable anterolisthesis at the operative level and at L3-4. No complications identified. IMPRESSION: Intraoperative views during L4-5 TLIF. No demonstrated complication. Electronically Signed   By: Carey Bullocks [name]   On: 02/06/2016 11:59   Dg Lumbar Spine 1 View  02/06/2016  CLINICAL DATA:  82 year old female undergoing lumbar surgery. Initial encounter. EXAM: LUMBAR SPINE - 1 VIEW COMPARISON:  Lumbar MRIs 01/20/2016 and earlier. CT Abdomen and Pelvis 03/13/2008. FINDINGS: Intraoperative portable cross-table lateral view of the lumbar spine labeled #1 at  0853 hours. Mild motion artifact. Same numbering system as on the recent MRI corresponding to normal lumbar segmentation which appears to be valid based on the 2009 CT. Chronic multilevel spondylolisthesis re- demonstrated, including anterolisthesis at L3-L4 and L4-L5. Cephalad surgical needle directed at the superior L2 spinous process. Caudal surgical needle tip near the inferior L3 spinous process, directed at the mid L4 vertebral level. IMPRESSION: Intraoperative localization as above. Electronically Signed   By: Odessa Fleming [name]   On: 02/06/2016 10:14   Dg C-arm 61-120 Min  02/06/2016  CLINICAL DATA:  L4-5  TLIF. EXAM: DG C-ARM 61-120 MIN; LUMBAR SPINE - 2-3 VIEW COMPARISON:  Intraoperative radiograph same date. Lumbar MRI 01/20/2016. FINDINGS: Spot PA and lateral fluoroscopic images of the lower lumbar spine demonstrate postsurgical changes at L4-5 with bilateral pedicle screws, interconnecting rods and interbody spacers. The hardware appears satisfactorily positioned. There is a stable anterolisthesis at the operative level and at L3-4. No complications identified. IMPRESSION: Intraoperative views during L4-5 TLIF. No demonstrated complication. Electronically Signed   By: Carey BullocksWilliam  Veazey [name]   On: 02/06/2016 11:59    Disposition: 01-Home or Self Care   POD #1 s/p L4/5 decompression and fusion, doing well  -Written scripts for pain signed and in chart -D/C instructions sheet printed and in chart -D/C today  -F/U in office 2 weeks   Signed: Georga BoraMCKENZIE, Yoseph Haile J 02/20/2016, 9:55 AM

## 2016-02-20 NOTE — Progress Notes (Signed)
Subjective:    Patient ID: [name], female    DOB: 04-08-1933, 82 y.o.   MRN: [id number]  Hypertension Pertinent negatives include no chest pain, headaches, palpitations or shortness of breath.   Patient presents to the office for evaluation of high blood pressure.  Patient recently had L4-L5 lumbar fusion with instrumentation on 02/06/16.  She reports that she has had a lot of pain since that time especially on the right leg.  She has been taking percocet.  She did run out early as they told her she could take 1-2 tablets daily.  She did end up going to the ER for high blood pressure.  When she arrived at the ER she had normal BP.  Her daughter admits that this was when she was out of pain meds and that they also likely missed a dose of blood pressure medications as well.  They have not been keeping a good log of blood pressure at home. She is supposed to be taking gabapentin and is not currently taking it.     Review of Systems  Constitutional: Negative for fever, chills and fatigue.  Eyes: Negative for visual disturbance.  Respiratory: Negative for chest tightness, shortness of breath and wheezing.   Cardiovascular: Negative for chest pain, palpitations and leg swelling.  Genitourinary: Negative for decreased urine volume and difficulty urinating.  Musculoskeletal: Positive for myalgias and back pain.  Neurological: Negative for dizziness, weakness, light-headedness and headaches.       Objective:   Physical Exam  Constitutional: She is oriented to person, place, and time. She appears well-developed and well-nourished. No distress.  HENT:  Head: Normocephalic.  Mouth/Throat: Oropharynx is clear and moist. No oropharyngeal exudate.  Eyes: Conjunctivae are normal. No scleral icterus.  Neck: Normal range of motion. Neck supple. No JVD present. No thyromegaly present.  Cardiovascular: Normal rate, regular rhythm, normal heart sounds and intact distal pulses.  Exam reveals no gallop  and no friction rub.   No murmur heard. Pulmonary/Chest: Effort normal and breath sounds normal. No respiratory distress. She has no wheezes. She has no rales. She exhibits no tenderness.  Abdominal: Soft. Bowel sounds are normal. She exhibits no distension and no mass. There is no tenderness. There is no rebound and no guarding.  Back brace in place  Musculoskeletal:  Patient rises slowly from sitting to standing.  They walk without an antalgic gait with a walker.  Sensation to light touch is intact over all extremities.  Strength is symmetric and equal in all extremities.       Lymphadenopathy:    She has no cervical adenopathy.  Neurological: She is alert and oriented to person, place, and time.  Skin: Skin is warm and dry. She is not diaphoretic.  Psychiatric: She has a normal mood and affect. Her behavior is normal. Judgment and thought content normal.  Nursing note and vitals reviewed.   Filed Vitals:   02/20/16 1142  BP: 122/80  Pulse: 64  Temp: 97.2 F (36.2 C)  Resp: 16         Assessment & Plan:    1. Benign essential HTN -BP appears to be well controlled currently -elevation likely secondary from pain and missed dose of BP -keep BP log and patient to call in 1 week   2. Status post lumbar spinal fusion -cont percocet, patient encouraged to use if pain is bad as she is fearful that she will get addicted to medications -gabapentin 300 mg prior to bed -gabapentin  100 mg with breakfast and lunch

## 2016-02-21 DIAGNOSIS — Z4789 Encounter for other orthopedic aftercare: Secondary | ICD-10-CM | POA: Diagnosis not present

## 2016-02-21 DIAGNOSIS — F329 Major depressive disorder, single episode, unspecified: Secondary | ICD-10-CM | POA: Diagnosis not present

## 2016-02-21 DIAGNOSIS — E119 Type 2 diabetes mellitus without complications: Secondary | ICD-10-CM | POA: Diagnosis not present

## 2016-02-21 DIAGNOSIS — I10 Essential (primary) hypertension: Secondary | ICD-10-CM | POA: Diagnosis not present

## 2016-02-21 DIAGNOSIS — M1991 Primary osteoarthritis, unspecified site: Secondary | ICD-10-CM | POA: Diagnosis not present

## 2016-02-21 DIAGNOSIS — M4806 Spinal stenosis, lumbar region: Secondary | ICD-10-CM | POA: Diagnosis not present

## 2016-02-22 DIAGNOSIS — I10 Essential (primary) hypertension: Secondary | ICD-10-CM | POA: Diagnosis not present

## 2016-02-22 DIAGNOSIS — Z4789 Encounter for other orthopedic aftercare: Secondary | ICD-10-CM | POA: Diagnosis not present

## 2016-02-22 DIAGNOSIS — E119 Type 2 diabetes mellitus without complications: Secondary | ICD-10-CM | POA: Diagnosis not present

## 2016-02-22 DIAGNOSIS — M4806 Spinal stenosis, lumbar region: Secondary | ICD-10-CM | POA: Diagnosis not present

## 2016-02-22 DIAGNOSIS — M1991 Primary osteoarthritis, unspecified site: Secondary | ICD-10-CM | POA: Diagnosis not present

## 2016-02-22 DIAGNOSIS — F329 Major depressive disorder, single episode, unspecified: Secondary | ICD-10-CM | POA: Diagnosis not present

## 2016-02-25 DIAGNOSIS — E119 Type 2 diabetes mellitus without complications: Secondary | ICD-10-CM | POA: Diagnosis not present

## 2016-02-25 DIAGNOSIS — I10 Essential (primary) hypertension: Secondary | ICD-10-CM | POA: Diagnosis not present

## 2016-02-25 DIAGNOSIS — M4806 Spinal stenosis, lumbar region: Secondary | ICD-10-CM | POA: Diagnosis not present

## 2016-02-25 DIAGNOSIS — Z4789 Encounter for other orthopedic aftercare: Secondary | ICD-10-CM | POA: Diagnosis not present

## 2016-02-25 DIAGNOSIS — M1991 Primary osteoarthritis, unspecified site: Secondary | ICD-10-CM | POA: Diagnosis not present

## 2016-02-25 DIAGNOSIS — F329 Major depressive disorder, single episode, unspecified: Secondary | ICD-10-CM | POA: Diagnosis not present

## 2016-02-27 DIAGNOSIS — M4806 Spinal stenosis, lumbar region: Secondary | ICD-10-CM | POA: Diagnosis not present

## 2016-02-27 DIAGNOSIS — Z4789 Encounter for other orthopedic aftercare: Secondary | ICD-10-CM | POA: Diagnosis not present

## 2016-02-27 DIAGNOSIS — I10 Essential (primary) hypertension: Secondary | ICD-10-CM | POA: Diagnosis not present

## 2016-02-27 DIAGNOSIS — M1991 Primary osteoarthritis, unspecified site: Secondary | ICD-10-CM | POA: Diagnosis not present

## 2016-02-27 DIAGNOSIS — F329 Major depressive disorder, single episode, unspecified: Secondary | ICD-10-CM | POA: Diagnosis not present

## 2016-02-27 DIAGNOSIS — E119 Type 2 diabetes mellitus without complications: Secondary | ICD-10-CM | POA: Diagnosis not present

## 2016-02-28 ENCOUNTER — Ambulatory Visit (INDEPENDENT_AMBULATORY_CARE_PROVIDER_SITE_OTHER): Payer: Medicare Other | Admitting: Internal Medicine

## 2016-02-28 ENCOUNTER — Encounter: Payer: Self-pay | Admitting: Internal Medicine

## 2016-02-28 VITALS — BP 142/84 | HR 68 | Temp 97.0°F | Resp 16 | Ht 62.5 in | Wt 142.9 lb

## 2016-02-28 DIAGNOSIS — D509 Iron deficiency anemia, unspecified: Secondary | ICD-10-CM | POA: Diagnosis not present

## 2016-02-28 DIAGNOSIS — M1991 Primary osteoarthritis, unspecified site: Secondary | ICD-10-CM | POA: Diagnosis not present

## 2016-02-28 DIAGNOSIS — I10 Essential (primary) hypertension: Secondary | ICD-10-CM | POA: Diagnosis not present

## 2016-02-28 DIAGNOSIS — F329 Major depressive disorder, single episode, unspecified: Secondary | ICD-10-CM | POA: Diagnosis not present

## 2016-02-28 DIAGNOSIS — M4806 Spinal stenosis, lumbar region: Secondary | ICD-10-CM | POA: Diagnosis not present

## 2016-02-28 DIAGNOSIS — E119 Type 2 diabetes mellitus without complications: Secondary | ICD-10-CM | POA: Diagnosis not present

## 2016-02-28 DIAGNOSIS — R5383 Other fatigue: Secondary | ICD-10-CM

## 2016-02-28 DIAGNOSIS — Z4789 Encounter for other orthopedic aftercare: Secondary | ICD-10-CM | POA: Diagnosis not present

## 2016-02-28 DIAGNOSIS — Z79899 Other long term (current) drug therapy: Secondary | ICD-10-CM

## 2016-02-28 LAB — CBC WITH DIFFERENTIAL/PLATELET
BASOS PCT: 1 %
Basophils Absolute: 91 cells/uL (ref 0–200)
EOS ABS: 91 {cells}/uL (ref 15–500)
EOS PCT: 1 %
HCT: 36.2 % (ref 35.0–45.0)
Hemoglobin: 11.4 g/dL — ABNORMAL LOW (ref 11.7–15.5)
LYMPHS PCT: 18 %
Lymphs Abs: 1638 cells/uL (ref 850–3900)
MCH: 29.8 pg (ref 27.0–33.0)
MCHC: 31.5 g/dL — ABNORMAL LOW (ref 32.0–36.0)
MCV: 94.8 fL (ref 80.0–100.0)
MONOS PCT: 7 %
MPV: 9.6 fL (ref 7.5–12.5)
Monocytes Absolute: 637 cells/uL (ref 200–950)
NEUTROS ABS: 6643 {cells}/uL (ref 1500–7800)
Neutrophils Relative %: 73 %
PLATELETS: 431 10*3/uL — AB (ref 140–400)
RBC: 3.82 MIL/uL (ref 3.80–5.10)
RDW: 13.1 % (ref 11.0–15.0)
WBC: 9.1 10*3/uL (ref 3.8–10.8)

## 2016-02-28 LAB — BASIC METABOLIC PANEL WITH GFR
BUN: 14 mg/dL (ref 7–25)
CALCIUM: 9.8 mg/dL (ref 8.6–10.4)
CO2: 29 mmol/L (ref 20–31)
CREATININE: 1.07 mg/dL — AB (ref 0.60–0.88)
Chloride: 103 mmol/L (ref 98–110)
GFR, EST AFRICAN AMERICAN: 56 mL/min — AB (ref >=60)
GFR, EST NON AFRICAN AMERICAN: 49 mL/min — AB (ref >=60)
Glucose, Bld: 183 mg/dL — ABNORMAL HIGH (ref 65–99)
Potassium: 3.8 mmol/L (ref 3.5–5.3)
SODIUM: 144 mmol/L (ref 135–146)

## 2016-02-28 NOTE — Patient Instructions (Signed)
Change Gabapentin 100 mg to 2 x day at Supper & Bedtime  ------------------------------------------------------- - Hold the Gabapentin 300 mg for now

## 2016-02-28 NOTE — Progress Notes (Signed)
Subjective:    Patient ID: [name], female    DOB: 1933-07-27, 82 y.o.   MRN: [id number]  HPI  This nice 82 yo WWF is brought in by her out of town visiting daughter for re-check  Patient is about 3 weeks s/p lumbar fusion and apparently has had some elevated BP's by the visiting nurses. Patient also endorses increased sleepiness & fatigue and did have a sl drop in Hgb post surgery.  Patient did have Gabapentin restarted here for leg pains at 1st po visit 1 week ago. 10 point systems review negative otherwise.  Medication Sig  . aspirin 81 MG tablet Take 81 mg by mouth daily.  [name] Kitchen atenolol (TENORMIN) 100 MG tablet take 1 tablet by mouth once daily  . atorvastatin (LIPITOR) 80 MG tablet take 1 tablet by mouth once daily for cholesterol  . Biotin 10 MG CAPS Take 1 tablet by mouth daily.   . cholecalciferol (VITAMIN D) 1000 UNITS tablet Take 1,000 Units by mouth daily.  . citalopram (CELEXA) 40 MG tablet take 1 tablet by mouth once daily  . diazepam (VALIUM) 5 MG tablet Take 5 mg by mouth every 6 (six) hours as needed for anxiety or muscle spasms.  [name] Kitchen docusate sodium (COLACE) 100 MG capsule Take 1 capsule (100 mg total) by mouth 3 (three) times daily as needed. (Patient taking differently: Take 100 mg by mouth 3 (three) times daily as needed for mild constipation. )  . Furosemide 40 MG tablet Take 1 tablet by mouth  daily  . gabapentin  100 MG capsule Take 1 tablet with breakfast and lunch to help with leg pain.  [name] Kitchen gabapentin  300 MG caps Take 1 capsule (300 mg total) by mouth at bedtime.  [name] Kitchen glucose blood test strip Check blood sugar 1 time daily.  [name] Kitchen losartan (COZAAR) 100 MG tablet take 1 tablet by mouth once daily  . Magnesium 400 MG CAPS Take 1 capsule by mouth daily.   [name] Kitchen mexiletine (MEXITIL) 150 MG capsule Take 1 capsule (150 mg total) by mouth 3 (three) times daily. (Patient taking differently: Take 150 mg by mouth daily. )  . Multiple Vitamins-Minerals  Take 1 tablet by mouth daily.  [name] Kitchen  SYNTHROID 100 MCG tablet take 1 tablet by mouth once daily  . traZODone  100 MG tablet take 3 tablets by mouth at bedtime  . verapamil (VERELAN PM) 120 MG 24 hr capsule take 1 capsule by mouth twice a day with food for blood pressure (Patient taking differently: take 1 capsule by mouth daily)  . cloNIDine  0.1 MG tablet Take 1 tablet (0.1 mg total) by mouth 2 (two) times daily.  [name] Kitchen oxyCODONE-acetaminophen 5-325  Take 1 tablet by mouth every 6 (six) hours as needed for severe pain.   Allergies  Allergen Reactions  . Codeine Rash    rash  . Vasotec [Enalapril] Other (See Comments)    cough  . Augmentin [Amoxicillin-Pot Clavulanate] Other (See Comments)    vaginitis  . Hytrin [Terazosin] Other (See Comments)    incontinence   . Oxycodone Rash    rash  . Procardia [Nifedipine] Other (See Comments)    edema   Past Medical History  Diagnosis Date  . Hypertension   . Hyperlipidemia   . Anxiety   . Depression   . Thyroid disease   . Anemia   . History of toe surgery left  . GERD (gastroesophageal reflux disease)   . Wears glasses   . Type II  or unspecified type diabetes mellitus with renal manifestations, not stated as uncontrolled     stage II, does not take medication  . Constipation   . Arthritis   . History of blood transfusion   . Insomnia    Past Surgical History  Procedure Laterality Date  . Abdominal hysterectomy    . Tonsillectomy    . Appendectomy    . Cerebral aneurysm repair  09/2007    coil ablation  . Joint replacement Left 2012    left total knee  . Total knee arthroplasty Right 2005    right  . Total knee revision Right 2007    right  . Hammer toe surgery  2008    osteotomy=left foot  . Ganglion cyst excision Left 2008    lt ring finger  . Cholecystectomy  2012    lap choli  . Colonoscopy    . Shoulder arthroscopy with rotator cuff repair and subacromial decompression Right 01/29/2015    Procedure: RIGHT SHOULDER ARTHROSCOPY WITH DEBRIDEMENT ROTATOR  CUFF TEAR, SUBACROMIAL DECOMPRESSION, DISTAL CLAVICAL RESECTION;  Surgeon: Jones BroomJustin Chandler, MD;  Location: [facility];  Service: Orthopedics;  Laterality: Right;  Right shoulder arthroscopy debridement rotatoro cuff tear, subacromial decompression, distal clavical resection   Review of Systems    Objective:   Physical Exam  BP 142/84 mmHg  Pulse 68  Temp(Src) 97 F (36.1 C)  Resp 16  Ht 5' 2.5" (1.588 m)  Wt 142 lb 14.4 oz (64.819 kg)  BMI 25.70 kg/m2  Patient in no distress and wearing a body brace extending from the mid chest area to the low back atyer.  HEENT - Eac's patent. TM's Nl. EOM's full. PERRLA. NasoOroPharynx clear. Neck - supple. Nl Thyroid. Carotids 2+ & No bruits, nodes, JVD Chest - Clear equal BS w/o Rales, rhonchi, wheezes. Cor - Nl HS. RRR w/o sig MGR. PP 1(+). No edema. Abd - No palpable organomegaly, masses or tenderness. BS nl. MS- FROM w/o deformities. Muscle power, tone and bulk Nl. Ambulating with a rolling walker.  Neuro - No obvious Cr N abnormalities. Sensory, motor and Cerebellar functions appear Nl w/o focal abnormalities. Psyche - Mental status normal & appropriate.  No delusions, ideations or obvious mood abnormalities.    Assessment & Plan:   1. Essential hypertension   2. Iron deficiency anemia  - CBC with Differential/Platelet  3. Other fatigue  - CBC with Differential/Platelet  - recommended to taper the Gabapentin 100 mg to bid at supper and bedtime and hold the 300 mg for now.   4. Medication management  - BASIC METABOLIC PANEL WITH GFR - CBC with Differential/Platelet  - f/u disposition pending labs.

## 2016-02-29 DIAGNOSIS — I10 Essential (primary) hypertension: Secondary | ICD-10-CM | POA: Diagnosis not present

## 2016-02-29 DIAGNOSIS — Z4789 Encounter for other orthopedic aftercare: Secondary | ICD-10-CM | POA: Diagnosis not present

## 2016-02-29 DIAGNOSIS — M4806 Spinal stenosis, lumbar region: Secondary | ICD-10-CM | POA: Diagnosis not present

## 2016-02-29 DIAGNOSIS — M1991 Primary osteoarthritis, unspecified site: Secondary | ICD-10-CM | POA: Diagnosis not present

## 2016-02-29 DIAGNOSIS — E119 Type 2 diabetes mellitus without complications: Secondary | ICD-10-CM | POA: Diagnosis not present

## 2016-02-29 DIAGNOSIS — F329 Major depressive disorder, single episode, unspecified: Secondary | ICD-10-CM | POA: Diagnosis not present

## 2016-03-04 DIAGNOSIS — Z4789 Encounter for other orthopedic aftercare: Secondary | ICD-10-CM | POA: Diagnosis not present

## 2016-03-04 DIAGNOSIS — M4806 Spinal stenosis, lumbar region: Secondary | ICD-10-CM | POA: Diagnosis not present

## 2016-03-04 DIAGNOSIS — I10 Essential (primary) hypertension: Secondary | ICD-10-CM | POA: Diagnosis not present

## 2016-03-04 DIAGNOSIS — E119 Type 2 diabetes mellitus without complications: Secondary | ICD-10-CM | POA: Diagnosis not present

## 2016-03-04 DIAGNOSIS — M1991 Primary osteoarthritis, unspecified site: Secondary | ICD-10-CM | POA: Diagnosis not present

## 2016-03-04 DIAGNOSIS — F329 Major depressive disorder, single episode, unspecified: Secondary | ICD-10-CM | POA: Diagnosis not present

## 2016-03-06 DIAGNOSIS — E119 Type 2 diabetes mellitus without complications: Secondary | ICD-10-CM | POA: Diagnosis not present

## 2016-03-06 DIAGNOSIS — I10 Essential (primary) hypertension: Secondary | ICD-10-CM | POA: Diagnosis not present

## 2016-03-06 DIAGNOSIS — F329 Major depressive disorder, single episode, unspecified: Secondary | ICD-10-CM | POA: Diagnosis not present

## 2016-03-06 DIAGNOSIS — M4806 Spinal stenosis, lumbar region: Secondary | ICD-10-CM | POA: Diagnosis not present

## 2016-03-06 DIAGNOSIS — M1991 Primary osteoarthritis, unspecified site: Secondary | ICD-10-CM | POA: Diagnosis not present

## 2016-03-06 DIAGNOSIS — Z4789 Encounter for other orthopedic aftercare: Secondary | ICD-10-CM | POA: Diagnosis not present

## 2016-03-10 DIAGNOSIS — F329 Major depressive disorder, single episode, unspecified: Secondary | ICD-10-CM | POA: Diagnosis not present

## 2016-03-10 DIAGNOSIS — E119 Type 2 diabetes mellitus without complications: Secondary | ICD-10-CM | POA: Diagnosis not present

## 2016-03-10 DIAGNOSIS — M1991 Primary osteoarthritis, unspecified site: Secondary | ICD-10-CM | POA: Diagnosis not present

## 2016-03-10 DIAGNOSIS — Z4789 Encounter for other orthopedic aftercare: Secondary | ICD-10-CM | POA: Diagnosis not present

## 2016-03-10 DIAGNOSIS — I10 Essential (primary) hypertension: Secondary | ICD-10-CM | POA: Diagnosis not present

## 2016-03-10 DIAGNOSIS — M4806 Spinal stenosis, lumbar region: Secondary | ICD-10-CM | POA: Diagnosis not present

## 2016-03-11 DIAGNOSIS — E119 Type 2 diabetes mellitus without complications: Secondary | ICD-10-CM | POA: Diagnosis not present

## 2016-03-11 DIAGNOSIS — M1991 Primary osteoarthritis, unspecified site: Secondary | ICD-10-CM | POA: Diagnosis not present

## 2016-03-11 DIAGNOSIS — I10 Essential (primary) hypertension: Secondary | ICD-10-CM | POA: Diagnosis not present

## 2016-03-11 DIAGNOSIS — Z4789 Encounter for other orthopedic aftercare: Secondary | ICD-10-CM | POA: Diagnosis not present

## 2016-03-11 DIAGNOSIS — F329 Major depressive disorder, single episode, unspecified: Secondary | ICD-10-CM | POA: Diagnosis not present

## 2016-03-11 DIAGNOSIS — M4806 Spinal stenosis, lumbar region: Secondary | ICD-10-CM | POA: Diagnosis not present

## 2016-03-13 DIAGNOSIS — Z4789 Encounter for other orthopedic aftercare: Secondary | ICD-10-CM | POA: Diagnosis not present

## 2016-03-13 DIAGNOSIS — F329 Major depressive disorder, single episode, unspecified: Secondary | ICD-10-CM | POA: Diagnosis not present

## 2016-03-13 DIAGNOSIS — I10 Essential (primary) hypertension: Secondary | ICD-10-CM | POA: Diagnosis not present

## 2016-03-13 DIAGNOSIS — E119 Type 2 diabetes mellitus without complications: Secondary | ICD-10-CM | POA: Diagnosis not present

## 2016-03-13 DIAGNOSIS — M4806 Spinal stenosis, lumbar region: Secondary | ICD-10-CM | POA: Diagnosis not present

## 2016-03-13 DIAGNOSIS — M1991 Primary osteoarthritis, unspecified site: Secondary | ICD-10-CM | POA: Diagnosis not present

## 2016-03-14 DIAGNOSIS — E119 Type 2 diabetes mellitus without complications: Secondary | ICD-10-CM | POA: Diagnosis not present

## 2016-03-14 DIAGNOSIS — M1991 Primary osteoarthritis, unspecified site: Secondary | ICD-10-CM | POA: Diagnosis not present

## 2016-03-14 DIAGNOSIS — I10 Essential (primary) hypertension: Secondary | ICD-10-CM | POA: Diagnosis not present

## 2016-03-14 DIAGNOSIS — F329 Major depressive disorder, single episode, unspecified: Secondary | ICD-10-CM | POA: Diagnosis not present

## 2016-03-14 DIAGNOSIS — M4806 Spinal stenosis, lumbar region: Secondary | ICD-10-CM | POA: Diagnosis not present

## 2016-03-14 DIAGNOSIS — Z4789 Encounter for other orthopedic aftercare: Secondary | ICD-10-CM | POA: Diagnosis not present

## 2016-03-18 DIAGNOSIS — M4806 Spinal stenosis, lumbar region: Secondary | ICD-10-CM | POA: Diagnosis not present

## 2016-03-19 DIAGNOSIS — F329 Major depressive disorder, single episode, unspecified: Secondary | ICD-10-CM | POA: Diagnosis not present

## 2016-03-19 DIAGNOSIS — M4806 Spinal stenosis, lumbar region: Secondary | ICD-10-CM | POA: Diagnosis not present

## 2016-03-19 DIAGNOSIS — M1991 Primary osteoarthritis, unspecified site: Secondary | ICD-10-CM | POA: Diagnosis not present

## 2016-03-19 DIAGNOSIS — Z4789 Encounter for other orthopedic aftercare: Secondary | ICD-10-CM | POA: Diagnosis not present

## 2016-03-19 DIAGNOSIS — E119 Type 2 diabetes mellitus without complications: Secondary | ICD-10-CM | POA: Diagnosis not present

## 2016-03-19 DIAGNOSIS — I10 Essential (primary) hypertension: Secondary | ICD-10-CM | POA: Diagnosis not present

## 2016-03-20 DIAGNOSIS — F329 Major depressive disorder, single episode, unspecified: Secondary | ICD-10-CM | POA: Diagnosis not present

## 2016-03-20 DIAGNOSIS — M1991 Primary osteoarthritis, unspecified site: Secondary | ICD-10-CM | POA: Diagnosis not present

## 2016-03-20 DIAGNOSIS — Z4789 Encounter for other orthopedic aftercare: Secondary | ICD-10-CM | POA: Diagnosis not present

## 2016-03-20 DIAGNOSIS — M4806 Spinal stenosis, lumbar region: Secondary | ICD-10-CM | POA: Diagnosis not present

## 2016-03-20 DIAGNOSIS — E119 Type 2 diabetes mellitus without complications: Secondary | ICD-10-CM | POA: Diagnosis not present

## 2016-03-20 DIAGNOSIS — I10 Essential (primary) hypertension: Secondary | ICD-10-CM | POA: Diagnosis not present

## 2016-04-04 ENCOUNTER — Encounter: Payer: Self-pay | Admitting: Internal Medicine

## 2016-04-08 ENCOUNTER — Other Ambulatory Visit: Payer: Self-pay

## 2016-04-08 ENCOUNTER — Other Ambulatory Visit: Payer: Self-pay | Admitting: Internal Medicine

## 2016-04-08 DIAGNOSIS — Z1231 Encounter for screening mammogram for malignant neoplasm of breast: Secondary | ICD-10-CM

## 2016-04-17 ENCOUNTER — Ambulatory Visit (INDEPENDENT_AMBULATORY_CARE_PROVIDER_SITE_OTHER): Payer: Medicare Other | Admitting: Internal Medicine

## 2016-04-17 ENCOUNTER — Encounter: Payer: Self-pay | Admitting: Internal Medicine

## 2016-04-17 VITALS — BP 116/68 | HR 72 | Temp 97.5°F | Resp 16 | Ht 62.0 in | Wt 145.6 lb

## 2016-04-17 DIAGNOSIS — Z1212 Encounter for screening for malignant neoplasm of rectum: Secondary | ICD-10-CM

## 2016-04-17 DIAGNOSIS — E782 Mixed hyperlipidemia: Secondary | ICD-10-CM

## 2016-04-17 DIAGNOSIS — I10 Essential (primary) hypertension: Secondary | ICD-10-CM

## 2016-04-17 DIAGNOSIS — K2100 Gastro-esophageal reflux disease with esophagitis, without bleeding: Secondary | ICD-10-CM

## 2016-04-17 DIAGNOSIS — N183 Chronic kidney disease, stage 3 unspecified: Secondary | ICD-10-CM

## 2016-04-17 DIAGNOSIS — E1122 Type 2 diabetes mellitus with diabetic chronic kidney disease: Secondary | ICD-10-CM | POA: Diagnosis not present

## 2016-04-17 DIAGNOSIS — M545 Low back pain, unspecified: Secondary | ICD-10-CM

## 2016-04-17 DIAGNOSIS — E559 Vitamin D deficiency, unspecified: Secondary | ICD-10-CM | POA: Diagnosis not present

## 2016-04-17 DIAGNOSIS — Z136 Encounter for screening for cardiovascular disorders: Secondary | ICD-10-CM

## 2016-04-17 DIAGNOSIS — E1129 Type 2 diabetes mellitus with other diabetic kidney complication: Secondary | ICD-10-CM | POA: Diagnosis not present

## 2016-04-17 DIAGNOSIS — Z79899 Other long term (current) drug therapy: Secondary | ICD-10-CM

## 2016-04-17 DIAGNOSIS — E039 Hypothyroidism, unspecified: Secondary | ICD-10-CM | POA: Diagnosis not present

## 2016-04-17 LAB — CBC WITH DIFFERENTIAL/PLATELET
BASOS ABS: 75 {cells}/uL (ref 0–200)
BASOS PCT: 1 %
EOS ABS: 150 {cells}/uL (ref 15–500)
Eosinophils Relative: 2 %
HEMATOCRIT: 36.2 % (ref 35.0–45.0)
HEMOGLOBIN: 11.5 g/dL — AB (ref 11.7–15.5)
Lymphocytes Relative: 33 %
Lymphs Abs: 2475 cells/uL (ref 850–3900)
MCH: 29.5 pg (ref 27.0–33.0)
MCHC: 31.8 g/dL — ABNORMAL LOW (ref 32.0–36.0)
MCV: 92.8 fL (ref 80.0–100.0)
MONO ABS: 750 {cells}/uL (ref 200–950)
MONOS PCT: 10 %
MPV: 9.8 fL (ref 7.5–12.5)
NEUTROS ABS: 4050 {cells}/uL (ref 1500–7800)
Neutrophils Relative %: 54 %
Platelets: 295 10*3/uL (ref 140–400)
RBC: 3.9 MIL/uL (ref 3.80–5.10)
RDW: 13.4 % (ref 11.0–15.0)
WBC: 7.5 10*3/uL (ref 3.8–10.8)

## 2016-04-17 LAB — HEMOGLOBIN A1C
Hgb A1c MFr Bld: 6.6 % — ABNORMAL HIGH (ref <5.7)
Mean Plasma Glucose: 143 mg/dL

## 2016-04-17 LAB — TSH: TSH: 0.13 m[IU]/L — AB

## 2016-04-17 NOTE — Progress Notes (Signed)
Patient ID: [name], female   DOB: [birth year], 82 y.o.   MRN: [id number]  [name]                   [name], [name]    [name] [name], [name]      [name], [name]   [name]                [address]                [place], [place]. [address] Telephone [phone] Telefax [phone]  ______________________________________________________________________  Comprehensive Evaluation &  Examination     This very nice 82 y.o. [name]  presents for a  comprehensive evaluation and management of multiple medical co-morbidities.  Patient has been followed for HTN, T2_NIDDM, Hyperlipidemia and Vitamin D Deficiency.      HTN predates since 82. Patient's BP has been controlled at home and patient denies any cardiac symptoms as chest pain, palpitations, shortness of breath, dizziness or ankle swelling. Today's BP: 116/68 mmHg      Patient's hyperlipidemia is controlled with diet and medications. Patient denies myalgias or other medication SE's. Last lipids were at goal with Cholesterol 127; HDL 52; LDL 43; Triglycerides 158 on 01/02/2016.     Patient has T2_NIDDM predating since  Aug 2010. She has consequent CKD 3 with GFR 47 ml/min.  She is attempting to manage with diet and patient denies reactive hypoglycemic symptoms, visual blurring, diabetic polys, or paresthesias. Last A1c was 6.7% on 02/01/2016.     Finally, patient has history of Vitamin D Deficiency of "24"  and last Vitamin D was still low at  47 on 10/02/2015.    Medication Sig  . aspirin 81 MG tablet Take 81 mg by mouth daily.  [name] Kitchen atenolol 100 MG tablet take 1 tablet by mouth once daily  . atorvastatin  80 MG tablet take 1 tablet by mouth once daily for cholesterol  . Biotin 10 MG CAPS Take 1 tablet by mouth daily.   [name] Kitchen VITAMIN D 1000 UNITS  Take 1,000 Units by mouth daily.  . citalopram  40 MG tablet take 1 tablet by mouth once daily  .  cloNIDine  0.1 MG tablet Take 1 tablet (0.1 mg total) by mouth 2 (two) times daily.  . diazepam  5 MG tablet Take 5 mg by mouth every 6 (six) hours as needed for anxiety or muscle spasms.  [name] Kitchen docusate 100 MG  Take 1 capsule (100 mg total) by mouth 3 (three) times daily as needed.  . furosemide40 MG tablet Take 1 tablet by mouth  daily  . gabapentin  100 MG cap Take 1 tablet with breakfast and lunch to help with leg pain.  [name] Kitchen gabapentin  300 MG cap Take 1 capsule (300 mg total) by mouth at bedtime.  [name] Kitchen glucose blood test strip Check blood sugar 1 time daily.  [name] Kitchen losartan (COZAAR) 100 MG  take 1 tablet by mouth once daily  . Magnesium 400 MG CAPS Take 1 capsule by mouth daily.   [name] Kitchen mexiletine  150 MG  Take 1 capsule (150 mg total) by mouth 3 (three) times daily. (Patient taking differently: Take 150 mg by mouth daily. )  . Multiple Vitamins-Minerals  Take 1 tablet by mouth daily.  [name] Kitchen SYNTHROID 100 MCG  take 1 tablet by mouth once daily  . traZODone 100 MG tablet take 3 tablets by mouth at bedtime  .  verapamil PM) 120 MG 24 take 1 capsule by mouth twice a day with food for blood pressure (Patient taking differently: take 1 capsule by mouth daily)   Allergies  Allergen Reactions  . Codeine Rash  . Vasotec [Enalapril] cough  . Augmentin [Amoxicillin-Pot Clavulanate] vaginitis  . Hytrin [Terazosin] incontinence  . Oxycodone Rash  . Procardia [Nifedipine] edema   Past Medical History  Diagnosis Date  . Hypertension   . Hyperlipidemia   . Anxiety   . Depression   . Thyroid disease   . Anemia   . History of toe surgery left  . GERD (gastroesophageal reflux disease)   . Wears glasses   . Type II or unspecified type diabetes mellitus with renal manifestations, not stated as uncontrolled     stage II, does not take medication  . Constipation   . Arthritis   . History of blood transfusion   . Insomnia    Health Maintenance  Topic Date Due  . ZOSTAVAX  11/21/1994  . PNA vac Low Risk Adult (2  of 2 - PCV13) 11/26/2013  . OPHTHALMOLOGY EXAM  10/26/2014  . FOOT EXAM  03/09/2015  . INFLUENZA VACCINE  06/10/2016  . HEMOGLOBIN A1C  08/03/2016  . TETANUS/TDAP  08/24/2024  . DEXA SCAN  Completed   Immunization History  Administered Date(s) Administered  . Influenza, High Dose Seasonal PF 09/05/2014, 10/02/2015  . Influenza-Unspecified 09/10/2013  . Pneumococcal-Unspecified 11/26/2012  . Td 11/26/2012  . Tdap 08/24/2014   Past Surgical History  Procedure Laterality Date  . Abdominal hysterectomy    . Tonsillectomy    . Appendectomy    . Cerebral aneurysm repair  09/2007    coil ablation  . Joint replacement Left 2012    left total knee  . Total knee arthroplasty Right 2005    right  . Total knee revision Right 2007    right  . Hammer toe surgery  2008    osteotomy=left foot  . Ganglion cyst excision Left 2008    lt ring finger  . Cholecystectomy  2012    lap choli  . Colonoscopy    . Shoulder arthroscopy with rotator cuff repair and subacromial decompression  -  Jones BroomJustin Chandler, MD Right 01/29/2015   Family History  Problem Relation Age of Onset  . Cancer Mother     breast cancer  . Hypertension Mother   . Mental illness Mother     alz  . Cancer Father     colon cancer  . Hypertension Father   . Cancer Maternal Aunt     breast cancer with mets   Social History  Substance Use Topics  . Smoking status: Never Smoker   . Smokeless tobacco: Never Used  . Alcohol Use: Yes     Comment: rare    ROS Constitutional: Denies fever, chills, weight loss/gain, headaches, insomnia,  night sweats, and change in appetite. Does c/o fatigue. Eyes: Denies redness, blurred vision, diplopia, discharge, itchy, watery eyes.  ENT: Denies discharge, congestion, post nasal drip, epistaxis, sore throat, earache, hearing loss, dental pain, Tinnitus, Vertigo, Sinus pain, snoring.  Cardio: Denies chest pain, palpitations, irregular heartbeat, syncope, dyspnea, diaphoresis, orthopnea,  PND, claudication, edema Respiratory: denies cough, dyspnea, DOE, pleurisy, hoarseness, laryngitis, wheezing.  Gastrointestinal: Denies dysphagia, heartburn, reflux, water brash, pain, cramps, nausea, vomiting, bloating, diarrhea, constipation, hematemesis, melena, hematochezia, jaundice, hemorrhoids Genitourinary: Denies dysuria, frequency, urgency, nocturia, hesitancy, discharge, hematuria, flank pain Breast: Breast lumps, nipple discharge, bleeding.  Musculoskeletal: Denies arthralgia, myalgia, stiffness,  Jt. Swelling, pain, limp, and strain/sprain. Denies falls. Skin: Denies puritis, rash, hives, warts, acne, eczema, changing in skin lesion Neuro: No weakness, tremor, incoordination, spasms, paresthesia, pain Psychiatric: Denies confusion, memory loss, sensory loss. Denies Depression. Endocrine: Denies change in weight, skin, hair change, nocturia, and paresthesia, diabetic polys, visual blurring, hyper / hypo glycemic episodes.  Heme/Lymph: No excessive bleeding, bruising, enlarged lymph nodes.  Physical Exam  BP 116/68 mmHg  Pulse 72  Temp(Src) 97.5 F (36.4 C)  Resp 16  Ht  (1.575 m)  Wt 145 lb 9.6 oz (66.044 kg)  BMI 26.62 kg/m2  General Appearance: Well nourished and in no apparent distress. Eyes: PERRLA, EOMs, conjunctiva no swelling or erythema, normal fundi and vessels. Sinuses: No frontal/maxillary tenderness ENT/Mouth: EACs patent / TMs  nl. Nares clear without erythema, swelling, mucoid exudates. Oral hygiene is good. No erythema, swelling, or exudate. Tongue normal, non-obstructing. Tonsils not swollen or erythematous. Hearing normal.  Neck: Supple, thyroid normal. No bruits, nodes or JVD. Respiratory: Respiratory effort normal.  BS equal and clear bilateral without rales, rhonci, wheezing or stridor. Cardio: Heart sounds are normal with regular rate and rhythm and no murmurs, rubs or gallops. Peripheral pulses are normal and equal bilaterally without edema. No  aortic or femoral bruits. Chest: symmetric with normal excursions and percussion. Breasts: Symmetric, without lumps, nipple discharge, retractions, or fibrocystic changes.  Abdomen: Flat, soft with bowel sounds active. Nontender, no guarding, rebound, hernias, masses, or organomegaly.  Lymphatics: Non tender without lymphadenopathy.  Genitourinary:  Musculoskeletal: Full ROM all peripheral extremities, joint stability, 5/5 strength, and normal gait. Skin: Warm and dry without rashes, lesions, cyanosis, clubbing or  ecchymosis.  Neuro: Cranial nerves intact, reflexes equal bilaterally. Normal muscle tone, no cerebellar symptoms. Sensation intact to touch , Vibratory and Monofilament to the toes bilaterally.  Pysch: Alert and oriented X 3, normal affect, Insight and Judgment appropriate.   Assessment and Plan   1. Essential hypertension  - Microalbumin / creatinine urine ratio - EKG 12-Lead - [place], RETROPERITNL ABD,  LTD - TSH  2. Mixed hyperlipidemia  - Lipid panel - TSH  3. CKD stage 3 due to type 2 diabetes mellitus (HCC)  - Microalbumin / creatinine urine ratio - Hemoglobin A1c - Insulin, random  4. Vitamin D deficiency  - VITAMIN D 25 Hydroxy   5. Hypothyroidism  - TSH  6. GERD   7. Screening for rectal cancer  - POC Hemoccult Bld/Stl   8. Medication management  - Urinalysis, Routine w reflex microscopic  - CBC with Differential/Platelet - BASIC METABOLIC PANEL WITH GFR - Hepatic function panel - Magnesium   Continue prudent diet as discussed, weight control, BP monitoring, regular exercise, and medications. Discussed med's effects and SE's. Screening labs and tests as requested with regular follow-up as recommended. Over 40 minutes of exam, counseling, chart review and high complex critical decision making was performed.

## 2016-04-17 NOTE — Patient Instructions (Signed)

## 2016-04-18 ENCOUNTER — Encounter: Payer: Self-pay | Admitting: Internal Medicine

## 2016-04-18 ENCOUNTER — Other Ambulatory Visit: Payer: Self-pay | Admitting: Internal Medicine

## 2016-04-18 DIAGNOSIS — E039 Hypothyroidism, unspecified: Secondary | ICD-10-CM

## 2016-04-18 LAB — URINALYSIS, ROUTINE W REFLEX MICROSCOPIC
Bilirubin Urine: NEGATIVE
GLUCOSE, UA: NEGATIVE
HGB URINE DIPSTICK: NEGATIVE
NITRITE: NEGATIVE
PH: 6 (ref 5.0–8.0)
SPECIFIC GRAVITY, URINE: 1.023 (ref 1.001–1.035)

## 2016-04-18 LAB — BASIC METABOLIC PANEL WITH GFR
BUN: 16 mg/dL (ref 7–25)
CHLORIDE: 103 mmol/L (ref 98–110)
CO2: 24 mmol/L (ref 20–31)
CREATININE: 1.1 mg/dL — AB (ref 0.60–0.88)
Calcium: 9.4 mg/dL (ref 8.6–10.4)
GFR, Est African American: 54 mL/min — ABNORMAL LOW (ref >=60)
GFR, Est Non African American: 47 mL/min — ABNORMAL LOW (ref >=60)
GLUCOSE: 100 mg/dL — AB (ref 65–99)
POTASSIUM: 3.8 mmol/L (ref 3.5–5.3)
Sodium: 142 mmol/L (ref 135–146)

## 2016-04-18 LAB — MICROALBUMIN / CREATININE URINE RATIO
CREATININE, URINE: 226 mg/dL (ref 20–320)
MICROALB UR: 3.8 mg/dL
Microalb Creat Ratio: 17 mcg/mg creat (ref <30)

## 2016-04-18 LAB — HEPATIC FUNCTION PANEL
ALBUMIN: 4.2 g/dL (ref 3.6–5.1)
ALT: 13 U/L (ref 6–29)
AST: 22 U/L (ref 10–35)
Alkaline Phosphatase: 69 U/L (ref 33–130)
BILIRUBIN DIRECT: 0.1 mg/dL (ref <=0.2)
BILIRUBIN TOTAL: 0.5 mg/dL (ref 0.2–1.2)
Indirect Bilirubin: 0.4 mg/dL (ref 0.2–1.2)
Total Protein: 6.8 g/dL (ref 6.1–8.1)

## 2016-04-18 LAB — LIPID PANEL
CHOL/HDL RATIO: 2.2 ratio (ref <=5.0)
CHOLESTEROL: 137 mg/dL (ref 125–200)
HDL: 62 mg/dL (ref >=46)
LDL Cholesterol: 55 mg/dL (ref <130)
TRIGLYCERIDES: 101 mg/dL (ref <150)
VLDL: 20 mg/dL (ref <30)

## 2016-04-18 LAB — URINALYSIS, MICROSCOPIC ONLY
BACTERIA UA: NONE SEEN [HPF]
CASTS: NONE SEEN [LPF]
CRYSTALS: NONE SEEN [HPF]
Yeast: NONE SEEN [HPF]

## 2016-04-18 LAB — VITAMIN D 25 HYDROXY (VIT D DEFICIENCY, FRACTURES): VIT D 25 HYDROXY: 57 ng/mL (ref 30–100)

## 2016-04-18 LAB — MAGNESIUM: MAGNESIUM: 2.1 mg/dL (ref 1.5–2.5)

## 2016-04-18 LAB — INSULIN, RANDOM: INSULIN: 6.8 u[IU]/mL (ref 2.0–19.6)

## 2016-04-21 ENCOUNTER — Telehealth: Payer: Self-pay | Admitting: *Deleted

## 2016-04-21 NOTE — Telephone Encounter (Signed)
Pt aware of lab results 

## 2016-04-23 ENCOUNTER — Other Ambulatory Visit: Payer: Self-pay | Admitting: Internal Medicine

## 2016-04-28 ENCOUNTER — Ambulatory Visit
Admission: RE | Admit: 2016-04-28 | Discharge: 2016-04-28 | Disposition: A | Discharge status: Home / Self Care | Payer: Medicare Other | Source: Ambulatory Visit

## 2016-04-28 DIAGNOSIS — Z1231 Encounter for screening mammogram for malignant neoplasm of breast: Secondary | ICD-10-CM

## 2016-04-29 DIAGNOSIS — M4806 Spinal stenosis, lumbar region: Secondary | ICD-10-CM | POA: Diagnosis not present

## 2016-05-07 ENCOUNTER — Other Ambulatory Visit: Payer: Self-pay | Admitting: Internal Medicine

## 2016-05-22 ENCOUNTER — Ambulatory Visit: Payer: Self-pay

## 2016-05-26 ENCOUNTER — Ambulatory Visit: Payer: Self-pay

## 2016-06-03 ENCOUNTER — Ambulatory Visit (INDEPENDENT_AMBULATORY_CARE_PROVIDER_SITE_OTHER): Payer: Medicare Other

## 2016-06-03 ENCOUNTER — Other Ambulatory Visit: Payer: Self-pay | Admitting: Internal Medicine

## 2016-06-03 DIAGNOSIS — E039 Hypothyroidism, unspecified: Secondary | ICD-10-CM

## 2016-06-03 NOTE — Progress Notes (Signed)
PT PRESENTS FOR LAB ONLY BLOOD WORK (TSH). Pt states she takes 1/2 tablet on Monday, Wednesday, & Friday. Then on  Tuesday, Thursday, Saturday, & Sunday takes 1 whole tablet.

## 2016-06-04 LAB — TSH: TSH: 0.4 mIU/L

## 2016-07-05 DIAGNOSIS — G5602 Carpal tunnel syndrome, left upper limb: Secondary | ICD-10-CM | POA: Diagnosis not present

## 2016-07-07 ENCOUNTER — Encounter (HOSPITAL_BASED_OUTPATIENT_CLINIC_OR_DEPARTMENT_OTHER): Payer: Self-pay | Admitting: *Deleted

## 2016-07-07 ENCOUNTER — Other Ambulatory Visit: Payer: Self-pay | Admitting: Orthopedic Surgery

## 2016-07-08 ENCOUNTER — Ambulatory Visit (HOSPITAL_BASED_OUTPATIENT_CLINIC_OR_DEPARTMENT_OTHER)
Admission: RE | Admit: 2016-07-08 | Discharge: 2016-07-08 | Disposition: A | Discharge status: Home / Self Care | Payer: Medicare Other | Source: Ambulatory Visit | Attending: Orthopedic Surgery | Admitting: Orthopedic Surgery

## 2016-07-08 ENCOUNTER — Ambulatory Visit (HOSPITAL_BASED_OUTPATIENT_CLINIC_OR_DEPARTMENT_OTHER): Payer: Medicare Other | Admitting: Anesthesiology

## 2016-07-08 ENCOUNTER — Encounter (HOSPITAL_BASED_OUTPATIENT_CLINIC_OR_DEPARTMENT_OTHER)
Admission: RE | Disposition: A | Discharge status: Home / Self Care | Payer: Self-pay | Source: Ambulatory Visit | Attending: Orthopedic Surgery

## 2016-07-08 ENCOUNTER — Encounter (HOSPITAL_BASED_OUTPATIENT_CLINIC_OR_DEPARTMENT_OTHER): Payer: Self-pay

## 2016-07-08 DIAGNOSIS — G5602 Carpal tunnel syndrome, left upper limb: Secondary | ICD-10-CM | POA: Diagnosis not present

## 2016-07-08 DIAGNOSIS — Z96651 Presence of right artificial knee joint: Secondary | ICD-10-CM | POA: Diagnosis not present

## 2016-07-08 DIAGNOSIS — Z79899 Other long term (current) drug therapy: Secondary | ICD-10-CM | POA: Insufficient documentation

## 2016-07-08 DIAGNOSIS — F329 Major depressive disorder, single episode, unspecified: Secondary | ICD-10-CM | POA: Insufficient documentation

## 2016-07-08 DIAGNOSIS — K219 Gastro-esophageal reflux disease without esophagitis: Secondary | ICD-10-CM | POA: Diagnosis not present

## 2016-07-08 DIAGNOSIS — E1121 Type 2 diabetes mellitus with diabetic nephropathy: Secondary | ICD-10-CM | POA: Diagnosis not present

## 2016-07-08 DIAGNOSIS — E785 Hyperlipidemia, unspecified: Secondary | ICD-10-CM | POA: Diagnosis not present

## 2016-07-08 DIAGNOSIS — Z7982 Long term (current) use of aspirin: Secondary | ICD-10-CM | POA: Diagnosis not present

## 2016-07-08 DIAGNOSIS — E039 Hypothyroidism, unspecified: Secondary | ICD-10-CM | POA: Insufficient documentation

## 2016-07-08 DIAGNOSIS — I10 Essential (primary) hypertension: Secondary | ICD-10-CM | POA: Insufficient documentation

## 2016-07-08 DIAGNOSIS — E119 Type 2 diabetes mellitus without complications: Secondary | ICD-10-CM | POA: Diagnosis not present

## 2016-07-08 HISTORY — DX: Hypothyroidism, unspecified: E03.9

## 2016-07-08 HISTORY — PX: CARPAL TUNNEL RELEASE: SHX101

## 2016-07-08 LAB — POCT I-STAT, CHEM 8
BUN: 22 mg/dL — AB (ref 6–20)
CALCIUM ION: 1.11 mmol/L — AB (ref 1.15–1.40)
CHLORIDE: 100 mmol/L — AB (ref 101–111)
CREATININE: 1.3 mg/dL — AB (ref 0.44–1.00)
GLUCOSE: 130 mg/dL — AB (ref 65–99)
HCT: 37 % (ref 36.0–46.0)
Hemoglobin: 12.6 g/dL (ref 12.0–15.0)
Potassium: 3.7 mmol/L (ref 3.5–5.1)
Sodium: 142 mmol/L (ref 135–145)
TCO2: 29 mmol/L (ref 0–100)

## 2016-07-08 SURGERY — CARPAL TUNNEL RELEASE
Anesthesia: Monitor Anesthesia Care | Site: Wrist | Laterality: Left

## 2016-07-08 MED ORDER — MEPERIDINE HCL 25 MG/ML IJ SOLN: 6.2500 mg | INTRAMUSCULAR | Status: DC | PRN

## 2016-07-08 MED ORDER — GLYCOPYRROLATE 0.2 MG/ML IJ SOLN: 0.2000 mg | Freq: Once | INTRAMUSCULAR | Status: DC | PRN

## 2016-07-08 MED ORDER — PROPOFOL 10 MG/ML IV BOLUS
INTRAVENOUS | Status: DC | PRN
  Administered 2016-07-08: 10 mg via INTRAVENOUS
  Administered 2016-07-08 (×2): 20 mg via INTRAVENOUS

## 2016-07-08 MED ORDER — LIDOCAINE 2% (20 MG/ML) 5 ML SYRINGE
INTRAMUSCULAR | Status: AC
  Filled 2016-07-08: qty 5

## 2016-07-08 MED ORDER — LIDOCAINE HCL (PF) 1 % IJ SOLN
INTRAMUSCULAR | Status: DC | PRN
  Administered 2016-07-08: 3 mL

## 2016-07-08 MED ORDER — FENTANYL CITRATE (PF) 100 MCG/2ML IJ SOLN
INTRAMUSCULAR | Status: AC
  Filled 2016-07-08: qty 2

## 2016-07-08 MED ORDER — BUPIVACAINE HCL (PF) 0.5 % IJ SOLN
INTRAMUSCULAR | Status: AC
  Filled 2016-07-08: qty 30

## 2016-07-08 MED ORDER — ONDANSETRON HCL 4 MG/2ML IJ SOLN
INTRAMUSCULAR | Status: AC
  Filled 2016-07-08: qty 2

## 2016-07-08 MED ORDER — FENTANYL CITRATE (PF) 100 MCG/2ML IJ SOLN
50.0000 ug | INTRAMUSCULAR | Status: DC | PRN
  Administered 2016-07-08: 50 ug via INTRAVENOUS

## 2016-07-08 MED ORDER — SCOPOLAMINE 1 MG/3DAYS TD PT72: 1.0000 | MEDICATED_PATCH | Freq: Once | TRANSDERMAL | Status: DC | PRN

## 2016-07-08 MED ORDER — FENTANYL CITRATE (PF) 100 MCG/2ML IJ SOLN: 25.0000 ug | INTRAMUSCULAR | Status: DC | PRN

## 2016-07-08 MED ORDER — VANCOMYCIN HCL IN DEXTROSE 1-5 GM/200ML-% IV SOLN
INTRAVENOUS | Status: AC
  Filled 2016-07-08: qty 200

## 2016-07-08 MED ORDER — LACTATED RINGERS IV SOLN
INTRAVENOUS | Status: DC
  Administered 2016-07-08: 09:00:00 via INTRAVENOUS

## 2016-07-08 MED ORDER — HYDROCODONE-ACETAMINOPHEN 5-325 MG PO TABS: 1.0000 | ORAL_TABLET | Freq: Four times a day (QID) | ORAL | 0 refills | Status: DC | PRN

## 2016-07-08 MED ORDER — BUPIVACAINE HCL (PF) 0.5 % IJ SOLN
INTRAMUSCULAR | Status: DC | PRN
  Administered 2016-07-08: 3 mL

## 2016-07-08 MED ORDER — POVIDONE-IODINE 7.5 % EX SOLN: Freq: Once | CUTANEOUS | Status: DC

## 2016-07-08 MED ORDER — PROPOFOL 10 MG/ML IV BOLUS
INTRAVENOUS | Status: AC
  Filled 2016-07-08: qty 20

## 2016-07-08 MED ORDER — ONDANSETRON HCL 4 MG/2ML IJ SOLN
INTRAMUSCULAR | Status: DC | PRN
Start: 2016-07-08 — End: 2016-07-08
  Administered 2016-07-08: 4 mg via INTRAVENOUS

## 2016-07-08 MED ORDER — MIDAZOLAM HCL 2 MG/2ML IJ SOLN: 1.0000 mg | INTRAMUSCULAR | Status: DC | PRN

## 2016-07-08 MED ORDER — LIDOCAINE 2% (20 MG/ML) 5 ML SYRINGE
INTRAMUSCULAR | Status: DC | PRN
  Administered 2016-07-08: 40 mg via INTRAVENOUS

## 2016-07-08 MED ORDER — DOCUSATE SODIUM 100 MG PO CAPS: 100.0000 mg | ORAL_CAPSULE | Freq: Three times a day (TID) | ORAL | 0 refills | Status: DC | PRN

## 2016-07-08 MED ORDER — VANCOMYCIN HCL IN DEXTROSE 1-5 GM/200ML-% IV SOLN
1000.0000 mg | INTRAVENOUS | Status: AC
  Administered 2016-07-08: 1000 mg via INTRAVENOUS

## 2016-07-08 MED ORDER — LIDOCAINE HCL 2 % IJ SOLN
INTRAMUSCULAR | Status: AC
  Filled 2016-07-08: qty 20

## 2016-07-08 SURGICAL SUPPLY — 38 items
BANDAGE COBAN STERILE 2 (GAUZE/BANDAGES/DRESSINGS) ×3 IMPLANT
BLADE SURG 15 STRL LF DISP TIS (BLADE) ×1 IMPLANT
BLADE SURG 15 STRL SS (BLADE) ×2
BNDG CONFORM 2 STRL LF (GAUZE/BANDAGES/DRESSINGS) ×3 IMPLANT
BNDG ESMARK 4X9 LF (GAUZE/BANDAGES/DRESSINGS) ×3 IMPLANT
CHLORAPREP W/TINT 26ML (MISCELLANEOUS) ×3 IMPLANT
CORDS BIPOLAR (ELECTRODE) ×3 IMPLANT
COVER BACK TABLE 60X90IN (DRAPES) ×3 IMPLANT
DECANTER SPIKE VIAL GLASS SM (MISCELLANEOUS) IMPLANT
DRAPE EXTREMITY T 121X128X90 (DRAPE) ×3 IMPLANT
DRAPE IMP U-DRAPE 54X76 (DRAPES) ×3 IMPLANT
DRAPE SURG 17X23 STRL (DRAPES) ×3 IMPLANT
DRSG EMULSION OIL 3X3 NADH (GAUZE/BANDAGES/DRESSINGS) ×3 IMPLANT
GAUZE SPONGE 4X4 12PLY STRL (GAUZE/BANDAGES/DRESSINGS) ×3 IMPLANT
GLOVE BIO SURGEON STRL SZ 6.5 (GLOVE) ×2 IMPLANT
GLOVE BIO SURGEON STRL SZ7 (GLOVE) ×3 IMPLANT
GLOVE BIO SURGEON STRL SZ7.5 (GLOVE) ×3 IMPLANT
GLOVE BIO SURGEONS STRL SZ 6.5 (GLOVE) ×1
GLOVE BIOGEL PI IND STRL 7.0 (GLOVE) ×3 IMPLANT
GLOVE BIOGEL PI IND STRL 8 (GLOVE) ×1 IMPLANT
GLOVE BIOGEL PI INDICATOR 7.0 (GLOVE) ×6
GLOVE BIOGEL PI INDICATOR 8 (GLOVE) ×2
GOWN STRL REUS W/ TWL LRG LVL3 (GOWN DISPOSABLE) ×2 IMPLANT
GOWN STRL REUS W/ TWL XL LVL3 (GOWN DISPOSABLE) ×1 IMPLANT
GOWN STRL REUS W/TWL LRG LVL3 (GOWN DISPOSABLE) ×4
GOWN STRL REUS W/TWL XL LVL3 (GOWN DISPOSABLE) ×2
NEEDLE HYPO 25X1 1.5 SAFETY (NEEDLE) ×3 IMPLANT
NS IRRIG 1000ML POUR BTL (IV SOLUTION) ×3 IMPLANT
PACK BASIN DAY SURGERY FS (CUSTOM PROCEDURE TRAY) ×3 IMPLANT
PADDING CAST ABS 4INX4YD NS (CAST SUPPLIES) ×2
PADDING CAST ABS COTTON 4X4 ST (CAST SUPPLIES) ×1 IMPLANT
STOCKINETTE 4X48 STRL (DRAPES) ×3 IMPLANT
SUT ETHILON 4 0 PS 2 18 (SUTURE) ×3 IMPLANT
SYR BULB 3OZ (MISCELLANEOUS) ×3 IMPLANT
SYR CONTROL 10ML LL (SYRINGE) IMPLANT
TOWEL OR 17X24 6PK STRL BLUE (TOWEL DISPOSABLE) ×3 IMPLANT
TOWEL OR NON WOVEN STRL DISP B (DISPOSABLE) ×3 IMPLANT
UNDERPAD 30X30 (UNDERPADS AND DIAPERS) ×3 IMPLANT

## 2016-07-08 NOTE — Anesthesia Preprocedure Evaluation (Signed)
Anesthesia Evaluation  Patient identified by MRN, date of birth, ID band Patient awake    Reviewed: Allergy & Precautions, NPO status , Patient's Chart, lab work & pertinent test results, reviewed documented beta blocker date and time   Airway Mallampati: I  TM Distance: >3 FB Neck ROM: Full    Dental  (+) Teeth Intact, Dental Advisory Given   Pulmonary    breath sounds clear to auscultation       Cardiovascular hypertension, Pt. on medications and Pt. on home beta blockers  Rhythm:Regular Rate:Normal     Neuro/Psych    GI/Hepatic GERD  Controlled and Medicated,  Endo/Other  diabetes, Well Controlled, Type 2Hypothyroidism   Renal/GU      Musculoskeletal   Abdominal   Peds  Hematology   Anesthesia Other Findings   Reproductive/Obstetrics                             Anesthesia Physical Anesthesia Plan  ASA: III  Anesthesia Plan: MAC   Post-op Pain Management:    Induction: Intravenous  Airway Management Planned: Simple Face Mask  Additional Equipment:   Intra-op Plan:   Post-operative Plan:   Informed Consent: I have reviewed the patients History and Physical, chart, labs and discussed the procedure including the risks, benefits and alternatives for the proposed anesthesia with the patient or authorized representative who has indicated his/her understanding and acceptance.   Dental advisory given  Plan Discussed with: CRNA, Anesthesiologist and Surgeon  Anesthesia Plan Comments:         Anesthesia Quick Evaluation  

## 2016-07-08 NOTE — H&P (Signed)
[name] is an 82 y.o. female.   Chief Complaint: L hand numbness HPI: L carpal tunnel syndrome, severe, constant  Past Medical History:  Diagnosis Date  . Anemia   . Anxiety   . Arthritis   . Constipation   . Depression   . GERD (gastroesophageal reflux disease)   . History of blood transfusion   . History of toe surgery left  . Hyperlipidemia   . Hypertension   . Hypothyroidism   . Insomnia   . Thyroid disease   . Type II or unspecified type diabetes mellitus with renal manifestations, not stated as uncontrolled    stage II, does not take medication  . Wears glasses     Past Surgical History:  Procedure Laterality Date  . ABDOMINAL HYSTERECTOMY    . APPENDECTOMY    . BACK SURGERY  02/06/2016  . CEREBRAL ANEURYSM REPAIR  09/2007   coil ablation  . CHOLECYSTECTOMY  2012   lap choli  . COLONOSCOPY    . GANGLION CYST EXCISION Left 2008   lt ring finger  . HAMMER TOE SURGERY  2008   osteotomy=left foot  . JOINT REPLACEMENT Left 2012   left total knee  . SHOULDER ARTHROSCOPY WITH ROTATOR CUFF REPAIR AND SUBACROMIAL DECOMPRESSION Right 01/29/2015   Procedure: RIGHT SHOULDER ARTHROSCOPY WITH DEBRIDEMENT ROTATOR CUFF TEAR, SUBACROMIAL DECOMPRESSION, DISTAL CLAVICAL RESECTION;  Surgeon: Jones Broom, MD;  Location: [facility];  Service: Orthopedics;  Laterality: Right;  Right shoulder arthroscopy debridement rotatoro cuff tear, subacromial decompression, distal clavical resection  . TONSILLECTOMY    . TOTAL KNEE ARTHROPLASTY Right 2005   right  . TOTAL KNEE REVISION Right 2007   right    Family History  Problem Relation Age of Onset  . Cancer Mother     breast cancer  . Hypertension Mother   . Mental illness Mother     alz  . Cancer Father     colon cancer  . Hypertension Father   . Cancer Maternal Aunt     breast cancer with mets   Social History:  reports that she has never smoked. She has never used smokeless tobacco. She reports that she  drinks alcohol. She reports that she does not use drugs.  Allergies:  Allergies  Allergen Reactions  . Codeine Rash    rash  . Vasotec [Enalapril] Other (See Comments)    cough  . Augmentin [Amoxicillin-Pot Clavulanate] Other (See Comments)    vaginitis  . Hytrin [Terazosin] Other (See Comments)    incontinence   . Oxycodone Rash    rash  . Procardia [Nifedipine] Other (See Comments)    edema    Medications Prior to Admission  Medication Sig Dispense Refill  . aspirin 81 MG tablet Take 81 mg by mouth daily.    [name] Kitchen atenolol (TENORMIN) 100 MG tablet take 1 tablet by mouth once daily 120 tablet 0  . atorvastatin (LIPITOR) 80 MG tablet take 1 tablet by mouth once daily for cholesterol 120 tablet 1  . Biotin 10 MG CAPS Take 1 tablet by mouth daily.     . cholecalciferol (VITAMIN D) 1000 UNITS tablet Take 1,000 Units by mouth daily.    . citalopram (CELEXA) 40 MG tablet take 1 tablet by mouth once daily 180 tablet 1  . cloNIDine (CATAPRES) 0.1 MG tablet Take 1 tablet (0.1 mg total) by mouth 2 (two) times daily. 60 tablet 0  . furosemide (LASIX) 40 MG tablet Take 1 tablet by mouth  daily 120 tablet 1  . gabapentin (NEURONTIN) 100 MG capsule take 1 capsule by mouth WITH BREAKFAST AND LUNCH TO HELP WITH LEG PAIN 60 capsule 1  . lansoprazole (PREVACID) 15 MG capsule Take 15 mg by mouth daily at 12 noon.    [name] Kitchen. losartan (COZAAR) 100 MG tablet take 1 tablet by mouth once daily 30 tablet 3  . Magnesium 400 MG CAPS Take 1 capsule by mouth daily.     [name] Kitchen. mexiletine (MEXITIL) 150 MG capsule Take 1 capsule (150 mg total) by mouth 3 (three) times daily. (Patient taking differently: Take 150 mg by mouth daily. ) 270 capsule 1  . Multiple Vitamins-Minerals (MULTIVITAMIN WITH MINERALS) tablet Take 1 tablet by mouth daily.    [name] Kitchen. SYNTHROID 100 MCG tablet take 1 tablet by mouth once daily 90 tablet 4  . traZODone (DESYREL) 100 MG tablet take 3 tablets by mouth at bedtime 270 tablet 1  . verapamil (VERELAN PM)  120 MG 24 hr capsule take 1 capsule by mouth twice a day with food for blood pressure (Patient taking differently: take 1 capsule by mouth daily) 180 capsule 1    No results found for this or any previous visit (from the past 48 hour(s)). No results found.  Review of Systems  All other systems reviewed and are negative.   Blood pressure (!) 148/68, pulse (!) 53, temperature 98.2 F (36.8 C), temperature source Oral, resp. rate 18, height 5\' 2"  (1.575 m), weight 66.7 kg (147 lb), SpO2 96 %. Physical Exam  Constitutional: She is oriented to person, place, and time. She appears well-developed and well-nourished.  HENT:  Head: Atraumatic.  Eyes: EOM are normal.  Cardiovascular: Intact distal pulses.   Respiratory: Effort normal.  Musculoskeletal:  L hand with dec STLT med nerve dist.  Neurological: She is alert and oriented to person, place, and time.  Skin: Skin is warm and dry.  Psychiatric: She has a normal mood and affect.     Assessment/Plan L carpal tunnel syndrome, severe, constant Plan open CTR Risks / benefits of surgery discussed Consent on chart  NPO for OR Preop antibiotics    Mable ParisHANDLER,Jayziah Bankhead WILLIAM, MD 07/08/2016, 9:17 AM

## 2016-07-08 NOTE — Anesthesia Preprocedure Evaluation (Incomplete Revision)
Anesthesia Evaluation  Patient identified by MRN, date of birth, ID band Patient awake    Reviewed: Allergy & Precautions, NPO status , Patient's Chart, lab work & pertinent test results, reviewed documented beta blocker date and time   Airway Mallampati: I  TM Distance: >3 FB Neck ROM: Full    Dental  (+) Teeth Intact, Dental Advisory Given   Pulmonary    breath sounds clear to auscultation       Cardiovascular hypertension, Pt. on medications and Pt. on home beta blockers  Rhythm:Regular Rate:Normal     Neuro/Psych    GI/Hepatic GERD  Controlled and Medicated,  Endo/Other  diabetes, Well Controlled, Type 2Hypothyroidism   Renal/GU      Musculoskeletal   Abdominal   Peds  Hematology   Anesthesia Other Findings   Reproductive/Obstetrics                             Anesthesia Physical Anesthesia Plan  ASA: III  Anesthesia Plan: MAC   Post-op Pain Management:    Induction: Intravenous  Airway Management Planned: Simple Face Mask  Additional Equipment:   Intra-op Plan:   Post-operative Plan:   Informed Consent: I have reviewed the patients History and Physical, chart, labs and discussed the procedure including the risks, benefits and alternatives for the proposed anesthesia with the patient or authorized representative who has indicated his/her understanding and acceptance.   Dental advisory given  Plan Discussed with: CRNA, Anesthesiologist and Surgeon  Anesthesia Plan Comments:         Anesthesia Quick Evaluation

## 2016-07-08 NOTE — Op Note (Signed)
Procedure(s): CARPAL TUNNEL RELEASE Procedure Note  [name] female 82 y.o. 07/08/2016  Procedure(s) and Anesthesia Type:    * CARPAL TUNNEL RELEASE - Monitor Anesthesia Care  Surgeon(s) and Role:    * Jones BroomJustin Kaidance Pantoja, MD - Primary   Indications:  82 y.o. female with left carpal tunnel syndrome. The patient has findings of severe carpal tunnel syndrome and has failed nonoperative treatment.     Surgeon: Mable ParisHANDLER,[name]   Assistants: Melanie Lackanielle Lalibert PA-C [facility](Danielle was present and scrubbed throughout the procedure and was essential in positioning, retraction, exposure, and closure)  Anesthesia: Monitored Local Anesthesia with Sedation    Procedure Detail  CARPAL TUNNEL RELEASE  Findings: The transcarpal ligament was very tight and thick. The underlying nerve was somewhat flattened and effaced but healthy-appearing. At the conclusion the ligament was widely patent.  Estimated Blood Loss:  Minimal         Drains: none  Blood Given: none         Specimens: none        Complications:  * No complications entered in OR log *  Tourniquet time: Less than 15 min at 250 mmHg         Disposition: PACU - hemodynamically stable.         Condition: stable    Procedure: The patient was brought to the operating room and was placed supine on the operative table.  Local MAC anesthesia was used.  A nonsterile tourniquet was applied and the operative extremity was prepped and draped in the standard sterile fashion.  The limb was exsanguinated using an Esmarch dressing and the tourniquet was elevated to 250 mm mercury.  A 3 cm incision was made in line with the web space between the third and fourth ray extending distally from the flexion crease of the wrist. Dissection was carried down through subcutaneous fat to the palmar fascia which was opened longitudinally in line with the incision. Underlying muscle was swept off the transcarpal ligament and a small rent was  made in the ligament with a 15 blade. A Freer elevator was then inserted proximally and distally to protect the contents of the carpal canal while a 15 blade was used under direct visualization to open proximally and distally. The proximal and distal extents of the transcarpal ligament were then carefully exposed and under direct visualization completely divided using tenotomy scissors. Great care was taken to protect the underlying nerve and distally the palmar arch. The transcarpal ligament was noted to be significantly thickened.  At the conclusion of the procedure the free edges of the transcarpal ligament or widely separated.  The wound was copiously irrigated with normal saline and subsequently closed in one layer with 4-0 nylon in an interrupted fashion.  Sterile dressings were applied including Adaptic 4 x 4's Kling dressing and a lightly wrapped Coban dressing. The tourniquet was let down. The fingers were pink and warm.  The patient was then awakened transferred to a stretcher and taken to the recovery room in stable condition.  Postoperative plan: Patient will be discharged home today and will followup in 10 days for suture removal and wound check.

## 2016-07-08 NOTE — Discharge Instructions (Signed)
Discharge Instructions after Carpal Tunnel Release   You will have a light dressing on your hand.  You may begin gentle motion of your fingers and hand immediately, but you should not do any heavy lifting or gripping.  Elevate your hand for the first 48 hours after surgery. Pain medicine has been prescribed for you.  Use your medicine as needed over the first 48 hours, and then you can begin to taper your use. You may take Extra Strength Tylenol or Tylenol only in place of the pain pills.  Leave the dressing in place until the third day after your surgery and then remove it and place a band-aid over the stitches.  After the bandage has been removed you may shower, but do not soak the incision.  You may drive a car when you are off of prescription pain medications and can safely control your vehicle with both hands.    Post Anesthesia Home Care Instructions  Activity: Get plenty of rest for the remainder of the day. A responsible adult should stay with you for 24 hours following the procedure.  For the next 24 hours, DO NOT: -Drive a car -Advertising copywriterperate machinery -Drink alcoholic beverages -Take any medication unless instructed by your physician -Make any legal decisions or sign important papers.  Meals: Start with liquid foods such as gelatin or soup. Progress to regular foods as tolerated. Avoid greasy, spicy, heavy foods. If nausea and/or vomiting occur, drink only clear liquids until the nausea and/or vomiting subsides. Call your physician if vomiting continues.  Special Instructions/Symptoms: Your throat may feel dry or sore from the anesthesia or the breathing tube placed in your throat during surgery. If this causes discomfort, gargle with warm salt water. The discomfort should disappear within 24 hours.  If you had a scopolamine patch placed behind your ear for the management of post- operative nausea and/or vomiting:  1. The medication in the patch is effective for 72 hours, after  which it should be removed.  Wrap patch in a tissue and discard in the trash. Wash hands thoroughly with soap and water. 2. You may remove the patch earlier than 72 hours if you experience unpleasant side effects which may include dry mouth, dizziness or visual disturbances. 3. Avoid touching the patch. Wash your hands with soap and water after contact with the patch.      Please call [phone] during normal business hours or [phone] after hours for any problems. Including the following:  - excessive redness of the incisions - drainage for more than 4 days - fever of more than 101.5 F  *Please note that pain medications will not be refilled after hours or on weekends.

## 2016-07-08 NOTE — Anesthesia Postprocedure Evaluation (Signed)
Anesthesia Post Note  Patient: [name]  Procedure(s) Performed: Procedure(s) (LRB): CARPAL TUNNEL RELEASE (Left)  Patient location during evaluation: PACU Anesthesia Type: MAC Level of consciousness: awake and alert Pain management: pain level controlled Vital Signs Assessment: post-procedure vital signs reviewed and stable Respiratory status: spontaneous breathing, nonlabored ventilation and respiratory function stable Cardiovascular status: stable and blood pressure returned to baseline Anesthetic complications: no    Last Vitals:  Vitals:   07/08/16 1130 07/08/16 1215  BP: (!) 171/76 (!) 177/76  Pulse: 64   Resp: 17 18  Temp:  36.5 C    Last Pain:  Vitals:   07/08/16 1215  TempSrc:   PainSc: 0-No pain                 Erique Kaser A

## 2016-07-08 NOTE — Transfer of Care (Signed)
Immediate Anesthesia Transfer of Care Note  Patient: [name]  Procedure(s) Performed: Procedure(s) with comments: CARPAL TUNNEL RELEASE (Left) - Left carpal tunnel release  Patient Location: PACU  Anesthesia Type:MAC  Level of Consciousness: awake, alert  and oriented  Airway & Oxygen Therapy: Patient Spontanous Breathing and Patient connected to face mask oxygen  Post-op Assessment: Report given to RN and Post -op Vital signs reviewed and stable  Post vital signs: Reviewed and stable  Last Vitals:  Vitals:   07/08/16 0852  BP: (!) 148/68  Pulse: (!) 53  Resp: 18  Temp: 36.8 C    Last Pain:  Vitals:   07/08/16 0852  TempSrc: Oral         Complications: No apparent anesthesia complications

## 2016-07-09 ENCOUNTER — Encounter (HOSPITAL_BASED_OUTPATIENT_CLINIC_OR_DEPARTMENT_OTHER): Payer: Self-pay | Admitting: Orthopedic Surgery

## 2016-07-18 DIAGNOSIS — Z9889 Other specified postprocedural states: Secondary | ICD-10-CM | POA: Diagnosis not present

## 2016-07-24 ENCOUNTER — Encounter: Payer: Self-pay | Admitting: Internal Medicine

## 2016-07-24 ENCOUNTER — Ambulatory Visit (INDEPENDENT_AMBULATORY_CARE_PROVIDER_SITE_OTHER): Payer: Medicare Other | Admitting: Internal Medicine

## 2016-07-24 VITALS — BP 128/82 | HR 56 | Temp 98.0°F | Resp 16 | Ht 62.0 in | Wt 146.0 lb

## 2016-07-24 DIAGNOSIS — Z79899 Other long term (current) drug therapy: Secondary | ICD-10-CM | POA: Diagnosis not present

## 2016-07-24 DIAGNOSIS — E559 Vitamin D deficiency, unspecified: Secondary | ICD-10-CM | POA: Diagnosis not present

## 2016-07-24 DIAGNOSIS — E782 Mixed hyperlipidemia: Secondary | ICD-10-CM | POA: Diagnosis not present

## 2016-07-24 DIAGNOSIS — E1122 Type 2 diabetes mellitus with diabetic chronic kidney disease: Secondary | ICD-10-CM | POA: Diagnosis not present

## 2016-07-24 DIAGNOSIS — E039 Hypothyroidism, unspecified: Secondary | ICD-10-CM

## 2016-07-24 DIAGNOSIS — Z23 Encounter for immunization: Secondary | ICD-10-CM | POA: Diagnosis not present

## 2016-07-24 DIAGNOSIS — N183 Chronic kidney disease, stage 3 unspecified: Secondary | ICD-10-CM

## 2016-07-24 DIAGNOSIS — I10 Essential (primary) hypertension: Secondary | ICD-10-CM

## 2016-07-24 LAB — BASIC METABOLIC PANEL WITH GFR
BUN: 16 mg/dL (ref 7–25)
CHLORIDE: 104 mmol/L (ref 98–110)
CO2: 31 mmol/L (ref 20–31)
Calcium: 9.1 mg/dL (ref 8.6–10.4)
Creat: 1.09 mg/dL — ABNORMAL HIGH (ref 0.60–0.88)
GFR, EST AFRICAN AMERICAN: 55 mL/min — AB (ref >=60)
GFR, Est Non African American: 48 mL/min — ABNORMAL LOW (ref >=60)
GLUCOSE: 123 mg/dL — AB (ref 65–99)
POTASSIUM: 3.8 mmol/L (ref 3.5–5.3)
Sodium: 142 mmol/L (ref 135–146)

## 2016-07-24 LAB — HEPATIC FUNCTION PANEL
ALBUMIN: 4.2 g/dL (ref 3.6–5.1)
ALK PHOS: 62 U/L (ref 33–130)
ALT: 11 U/L (ref 6–29)
AST: 21 U/L (ref 10–35)
BILIRUBIN TOTAL: 0.5 mg/dL (ref 0.2–1.2)
Bilirubin, Direct: 0.1 mg/dL (ref <=0.2)
Indirect Bilirubin: 0.4 mg/dL (ref 0.2–1.2)
Total Protein: 6.5 g/dL (ref 6.1–8.1)

## 2016-07-24 LAB — CBC WITH DIFFERENTIAL/PLATELET
Basophils Absolute: 73 cells/uL (ref 0–200)
Basophils Relative: 1 %
EOS ABS: 292 {cells}/uL (ref 15–500)
Eosinophils Relative: 4 %
HEMATOCRIT: 35.8 % (ref 35.0–45.0)
HEMOGLOBIN: 11.7 g/dL (ref 11.7–15.5)
LYMPHS ABS: 1825 {cells}/uL (ref 850–3900)
LYMPHS PCT: 25 %
MCH: 30.3 pg (ref 27.0–33.0)
MCHC: 32.7 g/dL (ref 32.0–36.0)
MCV: 92.7 fL (ref 80.0–100.0)
MONO ABS: 584 {cells}/uL (ref 200–950)
MPV: 9.8 fL (ref 7.5–12.5)
Monocytes Relative: 8 %
Neutro Abs: 4526 cells/uL (ref 1500–7800)
Neutrophils Relative %: 62 %
Platelets: 267 10*3/uL (ref 140–400)
RBC: 3.86 MIL/uL (ref 3.80–5.10)
RDW: 14 % (ref 11.0–15.0)
WBC: 7.3 10*3/uL (ref 3.8–10.8)

## 2016-07-24 LAB — TSH: TSH: 0.67 mIU/L

## 2016-07-24 LAB — LIPID PANEL
CHOLESTEROL: 134 mg/dL (ref 125–200)
HDL: 59 mg/dL (ref >=46)
LDL Cholesterol: 51 mg/dL (ref <130)
TRIGLYCERIDES: 121 mg/dL (ref <150)
Total CHOL/HDL Ratio: 2.3 Ratio (ref <=5.0)
VLDL: 24 mg/dL (ref <30)

## 2016-07-24 NOTE — Progress Notes (Signed)
Assessment and Plan:  Hypertension:  -Continue medication,  -monitor blood pressure at home.  -Continue DASH diet.   -Reminder to go to the ER if any CP, SOB, nausea, dizziness, severe HA, changes vision/speech, left arm numbness and tingling, and jaw pain.  Cholesterol: -Continue diet and exercise.  -Check cholesterol.   Pre-diabetes: -Continue diet and exercise.  -Check A1C  Vitamin D Def: -check level -continue medications.   Carpal Tunnel Syndrome -release done -discussed massage -wound visualized and is healing well  Back surgery -has healed well -no longer having pain -is off all pain medication   Continue diet and meds as discussed. Further disposition pending results of labs.  HPI 82 y.o. female  presents for 3 month follow up with hypertension, hyperlipidemia, prediabetes and vitamin D.   Her blood pressure has been controlled at home, today their BP is BP: 128/82.   She does workout. She denies chest pain, shortness of breath, dizziness.  She reports that her blood pressures at home are running well.     She is on cholesterol medication and denies myalgias. Her cholesterol is at goal. The cholesterol last visit was:   Lab Results  Component Value Date   CHOL 137 04/17/2016   HDL 62 04/17/2016   LDLCALC 55 04/17/2016   TRIG 101 04/17/2016   CHOLHDL 2.2 04/17/2016     She has been working on diet and exercise for prediabetes, and denies foot ulcerations, hyperglycemia, hypoglycemia , increased appetite, nausea, paresthesia of the feet, polydipsia, polyuria, visual disturbances, vomiting and weight loss. Last A1C in the office was:  Lab Results  Component Value Date   HGBA1C 6.6 (H) 04/17/2016    Patient is on Vitamin D supplement.  Lab Results  Component Value Date   VD25OH 55 04/17/2016     She reports that she recently had her carpal tunnel surgery.  She reports that she has been doing well.  She does still have some tingling and numbness.     Current Medications:  Current Outpatient Prescriptions on File Prior to Visit  Medication Sig Dispense Refill  . aspirin 81 MG tablet Take 81 mg by mouth daily.    [name] Kitchen atenolol (TENORMIN) 100 MG tablet take 1 tablet by mouth once daily 120 tablet 0  . atorvastatin (LIPITOR) 80 MG tablet take 1 tablet by mouth once daily for cholesterol 120 tablet 1  . Biotin 10 MG CAPS Take 1 tablet by mouth daily.     . cholecalciferol (VITAMIN D) 1000 UNITS tablet Take 1,000 Units by mouth daily.    . citalopram (CELEXA) 40 MG tablet take 1 tablet by mouth once daily 180 tablet 1  . cloNIDine (CATAPRES) 0.1 MG tablet Take 1 tablet (0.1 mg total) by mouth 2 (two) times daily. 60 tablet 0  . docusate sodium (COLACE) 100 MG capsule Take 1 capsule (100 mg total) by mouth 3 (three) times daily as needed. 20 capsule 0  . furosemide (LASIX) 40 MG tablet Take 1 tablet by mouth  daily 120 tablet 1  . gabapentin (NEURONTIN) 100 MG capsule take 1 capsule by mouth WITH BREAKFAST AND LUNCH TO HELP WITH LEG PAIN 60 capsule 1  . HYDROcodone-acetaminophen (NORCO) 5-325 MG tablet Take 1 tablet by mouth every 6 (six) hours as needed for moderate pain. 30 tablet 0  . lansoprazole (PREVACID) 15 MG capsule Take 15 mg by mouth daily at 12 noon.    [name] Kitchen losartan (COZAAR) 100 MG tablet take 1 tablet by mouth once daily  30 tablet 3  . Magnesium 400 MG CAPS Take 1 capsule by mouth daily.     [name] Kitchen mexiletine (MEXITIL) 150 MG capsule Take 1 capsule (150 mg total) by mouth 3 (three) times daily. (Patient taking differently: Take 150 mg by mouth daily. ) 270 capsule 1  . Multiple Vitamins-Minerals (MULTIVITAMIN WITH MINERALS) tablet Take 1 tablet by mouth daily.    [name] Kitchen SYNTHROID 100 MCG tablet take 1 tablet by mouth once daily 90 tablet 4  . traZODone (DESYREL) 100 MG tablet take 3 tablets by mouth at bedtime 270 tablet 1  . verapamil (VERELAN PM) 120 MG 24 hr capsule take 1 capsule by mouth twice a day with food for blood pressure (Patient  taking differently: take 1 capsule by mouth daily) 180 capsule 1   No current facility-administered medications on file prior to visit.     Medical History:  Past Medical History:  Diagnosis Date  . Anemia   . Anxiety   . Arthritis   . Constipation   . Depression   . GERD (gastroesophageal reflux disease)   . History of blood transfusion   . History of toe surgery left  . Hyperlipidemia   . Hypertension   . Hypothyroidism   . Insomnia   . Thyroid disease   . Type II or unspecified type diabetes mellitus with renal manifestations, not stated as uncontrolled    stage II, does not take medication  . Wears glasses     Allergies:  Allergies  Allergen Reactions  . Codeine Rash    rash  . Vasotec [Enalapril] Other (See Comments)    cough  . Augmentin [Amoxicillin-Pot Clavulanate] Other (See Comments)    vaginitis  . Hytrin [Terazosin] Other (See Comments)    incontinence   . Oxycodone Rash    rash  . Procardia [Nifedipine] Other (See Comments)    edema     Review of Systems:  Review of Systems  Constitutional: Negative for chills, fever and malaise/fatigue.  HENT: Negative for congestion, ear pain and sore throat.   Eyes: Negative.   Respiratory: Negative for cough, shortness of breath and wheezing.   Cardiovascular: Negative for chest pain, palpitations and leg swelling.  Gastrointestinal: Negative for abdominal pain, blood in stool, constipation, diarrhea, heartburn and melena.  Genitourinary: Negative.   Skin: Negative.   Neurological: Negative for dizziness, sensory change, loss of consciousness and headaches.  Psychiatric/Behavioral: Negative for depression. The patient is not nervous/anxious and does not have insomnia.     Family history- Review and unchanged  Social history- Review and unchanged  Physical Exam: BP 128/82   Pulse (!) 56   Temp 98 F (36.7 C) (Temporal)   Resp 16   Ht 5\' 2"  (1.575 m)   Wt 146 lb (66.2 kg)   BMI 26.70 kg/m  Wt  Readings from Last 3 Encounters:  07/24/16 146 lb (66.2 kg)  07/08/16 147 lb (66.7 kg)  04/17/16 145 lb 9.6 oz (66 kg)    General Appearance: Well nourished well developed, in no apparent distress. Eyes: PERRLA, EOMs, conjunctiva no swelling or erythema ENT/Mouth: Ear canals normal without obstruction, swelling, erythma, discharge.  TMs normal bilaterally.  Oropharynx moist, clear, without exudate, or postoropharyngeal swelling. Neck: Supple, thyroid normal,no cervical adenopathy  Respiratory: Respiratory effort normal, Breath sounds clear A&P without rhonchi, wheeze, or rale.  No retractions, no accessory usage. Cardio: RRR with no MRGs. Brisk peripheral pulses without edema.  Abdomen: Soft, + BS,  Non tender, no guarding, rebound,  hernias, masses. Musculoskeletal: Full ROM, 5/5 strength, Normal gait Skin: Warm, dry without rashes, lesions, ecchymosis.  Neuro: Awake and oriented X 3, Cranial nerves intact. Normal muscle tone, no cerebellar symptoms. Psych: Normal affect, Insight and Judgment appropriate.    Terri Piedraourtney Forcucci, PA-C 9:23 AM [facility]

## 2016-07-25 LAB — HEMOGLOBIN A1C
HEMOGLOBIN A1C: 6.5 % — AB (ref <5.7)
MEAN PLASMA GLUCOSE: 140 mg/dL

## 2016-07-28 ENCOUNTER — Other Ambulatory Visit: Payer: Self-pay | Admitting: Internal Medicine

## 2016-08-10 ENCOUNTER — Other Ambulatory Visit: Payer: Self-pay | Admitting: Internal Medicine

## 2016-08-12 ENCOUNTER — Other Ambulatory Visit: Payer: Self-pay | Admitting: Internal Medicine

## 2016-08-13 DIAGNOSIS — Z9889 Other specified postprocedural states: Secondary | ICD-10-CM | POA: Diagnosis not present

## 2016-09-02 ENCOUNTER — Other Ambulatory Visit: Payer: Self-pay | Admitting: Internal Medicine

## 2016-09-15 ENCOUNTER — Other Ambulatory Visit: Payer: Self-pay | Admitting: Internal Medicine

## 2016-10-30 ENCOUNTER — Ambulatory Visit: Payer: Self-pay | Admitting: Internal Medicine

## 2016-11-17 ENCOUNTER — Other Ambulatory Visit: Payer: Self-pay | Admitting: Internal Medicine

## 2016-12-01 ENCOUNTER — Ambulatory Visit (INDEPENDENT_AMBULATORY_CARE_PROVIDER_SITE_OTHER): Payer: Medicare Other | Admitting: Internal Medicine

## 2016-12-01 ENCOUNTER — Encounter: Payer: Self-pay | Admitting: Internal Medicine

## 2016-12-01 VITALS — BP 176/82 | HR 64 | Temp 97.3°F | Ht 62.0 in | Wt 146.6 lb

## 2016-12-01 DIAGNOSIS — E039 Hypothyroidism, unspecified: Secondary | ICD-10-CM | POA: Diagnosis not present

## 2016-12-01 DIAGNOSIS — E559 Vitamin D deficiency, unspecified: Secondary | ICD-10-CM

## 2016-12-01 DIAGNOSIS — N183 Chronic kidney disease, stage 3 (moderate): Secondary | ICD-10-CM

## 2016-12-01 DIAGNOSIS — I10 Essential (primary) hypertension: Secondary | ICD-10-CM

## 2016-12-01 DIAGNOSIS — Z79899 Other long term (current) drug therapy: Secondary | ICD-10-CM

## 2016-12-01 DIAGNOSIS — K219 Gastro-esophageal reflux disease without esophagitis: Secondary | ICD-10-CM | POA: Diagnosis not present

## 2016-12-01 DIAGNOSIS — E1122 Type 2 diabetes mellitus with diabetic chronic kidney disease: Secondary | ICD-10-CM | POA: Diagnosis not present

## 2016-12-01 DIAGNOSIS — E782 Mixed hyperlipidemia: Secondary | ICD-10-CM | POA: Diagnosis not present

## 2016-12-01 LAB — CBC WITH DIFFERENTIAL/PLATELET
BASOS PCT: 1 %
Basophils Absolute: 86 cells/uL (ref 0–200)
EOS ABS: 344 {cells}/uL (ref 15–500)
EOS PCT: 4 %
HCT: 36.1 % (ref 35.0–45.0)
Hemoglobin: 11.8 g/dL (ref 11.7–15.5)
LYMPHS PCT: 35 %
Lymphs Abs: 3010 cells/uL (ref 850–3900)
MCH: 31 pg (ref 27.0–33.0)
MCHC: 32.7 g/dL (ref 32.0–36.0)
MCV: 94.8 fL (ref 80.0–100.0)
MONOS PCT: 9 %
MPV: 9.5 fL (ref 7.5–12.5)
Monocytes Absolute: 774 cells/uL (ref 200–950)
NEUTROS ABS: 4386 {cells}/uL (ref 1500–7800)
Neutrophils Relative %: 51 %
PLATELETS: 335 10*3/uL (ref 140–400)
RBC: 3.81 MIL/uL (ref 3.80–5.10)
RDW: 13.1 % (ref 11.0–15.0)
WBC: 8.6 10*3/uL (ref 3.8–10.8)

## 2016-12-01 LAB — TSH: TSH: 1.08 mIU/L

## 2016-12-01 MED ORDER — ISOSORBIDE MONONITRATE ER 60 MG PO TB24: ORAL_TABLET | ORAL | 5 refills | Status: DC

## 2016-12-01 NOTE — Patient Instructions (Signed)

## 2016-12-01 NOTE — Progress Notes (Signed)
[facility] [name], [name]        [name] [name], [name]       [name], [name]  [facility]                [address]                [place], [place]. [address] Telephone [phone] Telefax [phone] ______________________________________________________________________     This very nice 83 y.o. Wise presents for 6 month follow up with Hypertension, Hyperlipidemia, Pre-Diabetes and Vitamin D Deficiency. Patient also has GERD controlled with diet & meds.      Patient is treated for HTN (1987) & BP has been controlled at home. Today's BP is elevated at 176/82 conformed on recheck. Patient has had no complaints of any cardiac type chest pain, palpitations, dyspnea/orthopnea/PND, dizziness, claudication, or dependent edema.     Hyperlipidemia is controlled with diet & meds. Patient denies myalgias or other med SE's. Last Lipids were at goal: Lab Results  Component Value Date   CHOL 134 07/24/2016   HDL 59 07/24/2016   LDLCALC 51 07/24/2016   TRIG 121 07/24/2016   CHOLHDL 2.3 07/24/2016      Also, the patient has history of T2_NIDDM and MVH8(ION62) and has had no symptoms of reactive hypoglycemia, diabetic polys, paresthesias or visual blurring.  Last A1c was not at goal: Lab Results  Component Value Date   HGBA1C 6.5 (H) 07/24/2016      Patient has been on Thyroid replacement since 2012. .Further, the patient also has history of Vitamin D Deficiency in 2009 of "24" and supplements vitamin D without any suspected side-effects. Last vitamin D was still low (goal 70-100): Lab Results  Component Value Date   VD25OH 57 04/17/2016   Current Outpatient Prescriptions on File Prior to Visit  Medication Sig  . aspirin 81 MG Take  daily.  [name] Kitchen atenolol 100 MG  take 1 tab once daily  . Atorvastatin 80 MG  take 1 tab once daily  . Biotin 10 MG CAPS Take 1 tab daily.   [name] Kitchen VITAMIN D 1000 UNITS  Take  daily.   . citalopram  40 MG tablet take 1 tab once daily  . docusate  100 MG  Take 1 cap 3  times daily as needed.  . furosemide  40 MG tablet take 1 tab once daily  . gabapentin  100 MG capsule take 1 caps WITH BREAKFAST AND LUNCH   . lansoprazole 15 MG capsule Take  daily at 12 noon.  [name] Kitchen losartan  100 MG tablet take 1 tab once daily  . Magnesium 400 MG CAPS Take 1 cap daily.   [name] Kitchen mexiletine (MEXITIL) 150 MG  Take  daily.   . Multiple Vitamins-Minerals  Take 1 tab daily.  [name] Kitchen SYNTHROID 100 MCG tablet take 1 tab once daily  . traZODone  100 MG tablet take 3 tab at bedtime  . verapamil (VERELAN PM) 120 MG  take 1 capsule by mouth daily)   Allergies  Allergen Reactions  . Codeine Rash    rash  . Vasotec [Enalapril] Other (See Comments)    cough  . Augmentin [Amoxicillin-Pot Clavulanate] Other (See Comments)    vaginitis  . Hytrin [Terazosin] Other (See Comments)    incontinence   . Oxycodone Rash    rash  . Procardia [Nifedipine] Other (See Comments)    edema   PMHx:   Past Medical History:  Diagnosis  Date  . Anemia   . Anxiety   . Arthritis   . Constipation   . Depression   . GERD (gastroesophageal reflux disease)   . History of blood transfusion   . History of toe surgery left  . Hyperlipidemia   . Hypertension   . Hypothyroidism   . Insomnia   . Thyroid disease   . Type II or unspecified type diabetes mellitus with renal manifestations, not stated as uncontrolled    stage II, does not take medication  . Wears glasses    Immunization History  Administered Date(s) Administered  . Influenza, High Dose Seasonal PF 09/05/2014, 10/02/2015, 07/24/2016  . Influenza-Unspecified 09/10/2013  . Pneumococcal Conjugate-13 07/24/2016  . Pneumococcal-Unspecified 11/26/2012  . Td 11/26/2012  . Tdap 08/24/2014   Past Surgical History:  Procedure Laterality Date  . ABDOMINAL HYSTERECTOMY    . APPENDECTOMY    . BACK SURGERY  02/06/2016  . CARPAL TUNNEL RELEASE Left 07/08/2016    Procedure: CARPAL TUNNEL RELEASE;  Surgeon: Jones Broom, MD;  Location: [facility];  Service: Orthopedics;  Laterality: Left;  Left carpal tunnel release  . CEREBRAL ANEURYSM REPAIR  09/2007   coil ablation  . CHOLECYSTECTOMY  2012   lap choli  . COLONOSCOPY    . GANGLION CYST EXCISION Left 2008   lt ring finger  . HAMMER TOE SURGERY  2008   osteotomy=left foot  . JOINT REPLACEMENT Left 2012   left total knee  . SHOULDER ARTHROSCOPY WITH ROTATOR CUFF REPAIR AND SUBACROMIAL DECOMPRESSION Right 01/29/2015   Procedure: RIGHT SHOULDER ARTHROSCOPY WITH DEBRIDEMENT ROTATOR CUFF TEAR, SUBACROMIAL DECOMPRESSION, DISTAL CLAVICAL RESECTION;  Surgeon: Jones Broom, MD;  Location:  SURGERY CENTER;  Service: Orthopedics;  Laterality: Right;  Right shoulder arthroscopy debridement rotatoro cuff tear, subacromial decompression, distal clavical resection  . TONSILLECTOMY    . TOTAL KNEE ARTHROPLASTY Right 2005   right  . TOTAL KNEE REVISION Right 2007   right   FHx:    Reviewed / unchanged  SHx:    Reviewed / unchanged  Systems Review:  Constitutional: Denies fever, chills, wt changes, headaches, insomnia, fatigue, night sweats, change in appetite. Eyes: Denies redness, blurred vision, diplopia, discharge, itchy, watery eyes.  ENT: Denies discharge, congestion, post nasal drip, epistaxis, sore throat, earache, hearing loss, dental pain, tinnitus, vertigo, sinus pain, snoring.  CV: Denies chest pain, palpitations, irregular heartbeat, syncope, dyspnea, diaphoresis, orthopnea, PND, claudication or edema. Respiratory: denies cough, dyspnea, DOE, pleurisy, hoarseness, laryngitis, wheezing.  Gastrointestinal: Denies dysphagia, odynophagia, heartburn, reflux, water brash, abdominal pain or cramps, nausea, vomiting, bloating, diarrhea, constipation, hematemesis, melena, hematochezia  or hemorrhoids. Genitourinary: Denies dysuria, frequency, urgency, nocturia, hesitancy,  discharge, hematuria or flank pain. Musculoskeletal: Denies arthralgias, myalgias, stiffness, jt. swelling, pain, limping or strain/sprain.  Skin: Denies pruritus, rash, hives, warts, acne, eczema or change in skin lesion(s). Neuro: No weakness, tremor, incoordination, spasms, paresthesia or pain. Psychiatric: Denies confusion, memory loss or sensory loss. Endo: Denies change in weight, skin or hair change.  Heme/Lymph: No excessive bleeding, bruising or enlarged lymph nodes.  Physical Exam  BP (!) 176/82   Pulse 64   Temp 97.3 F (36.3 C)   Ht 5\' 2"  (1.575 m)   Wt 146 lb 9.6 oz (66.5 kg)   BMI 26.81 kg/m   Appears well nourished and in no distress.  Eyes: PERRLA, EOMs, conjunctiva no swelling or erythema. Sinuses: No frontal/maxillary tenderness ENT/Mouth: EAC's clear, TM's nl w/o erythema, bulging. Nares clear w/o erythema, swelling,  exudates. Oropharynx clear without erythema or exudates. Oral hygiene is good. Tongue normal, non obstructing. Hearing intact.  Neck: Supple. Thyroid nl. Car 2+/2+ without bruits, nodes or JVD. Chest: Respirations nl with BS clear & equal w/o rales, rhonchi, wheezing or stridor.  Cor: Heart sounds normal w/ regular rate and rhythm without sig. murmurs, gallops, clicks, or rubs. Peripheral pulses normal and equal  without edema.  Abdomen: Soft & bowel sounds normal. Non-tender w/o guarding, rebound, hernias, masses, or organomegaly.  Lymphatics: Unremarkable.  Musculoskeletal: Full ROM all peripheral extremities, joint stability, 5/5 strength, and normal gait.  Skin: Warm, dry without exposed rashes, lesions or ecchymosis apparent.  Neuro: Cranial nerves intact, reflexes equal bilaterally. Sensory-motor testing grossly intact. Tendon reflexes grossly intact.  Pysch: Alert & oriented x 3.  Insight and judgement nl & appropriate. No ideations.  Assessment and Plan:  1. Essential hypertension  - Add Rx - isosorbide mononitrate (IMDUR) 60 MG 24 hr  tablet; Take 1/2 to 1 tablet daily as directed for BP  Dispense: 30 tablet; Refill: 5 - ROV 1 week to recheck BP  - Continue medication, monitor blood pressure at home.  - Continue DASH diet. Reminder to go to the ER if any CP,  SOB, nausea, dizziness, severe HA, changes vision/speech,  left arm numbness and tingling and jaw pain. - CBC with Differential/Platelet - BASIC METABOLIC PANEL WITH GFR - TSH  2. Mixed hyperlipidemia - Continue diet/meds, exercise,& lifestyle modifications.   - Continue monitor periodic cholesterol/liver & renal functions  - Hepatic function panel - Lipid panel - TSH  3. CKD stage 3 due to type 2 diabetes mellitus (HCC)  - Continue diet, exercise, lifestyle modifications.  - Monitor appropriate labs. - Hemoglobin A1c - Insulin, random  4. Vitamin D deficiency  - Continue supplementation. - VITAMIN D 25 Hydroxy   5. Gastroesophageal reflux disease   6. Hypothyroidism  - TSH  7. Medication management  - CBC with Differential/Platelet - BASIC METABOLIC PANEL WITH GFR - Hepatic function panel - Magnesium       Recommended regular exercise, BP monitoring, weight control, and discussed med and SE's. Recommended labs to assess and monitor clinical status. Further disposition pending results of labs. Over 30 minutes of exam, counseling, chart review was performed

## 2016-12-02 LAB — HEPATIC FUNCTION PANEL
ALBUMIN: 4.3 g/dL (ref 3.6–5.1)
ALK PHOS: 64 U/L (ref 33–130)
ALT: 11 U/L (ref 6–29)
AST: 21 U/L (ref 10–35)
BILIRUBIN DIRECT: 0.1 mg/dL (ref <=0.2)
BILIRUBIN TOTAL: 0.4 mg/dL (ref 0.2–1.2)
Indirect Bilirubin: 0.3 mg/dL (ref 0.2–1.2)
Total Protein: 6.7 g/dL (ref 6.1–8.1)

## 2016-12-02 LAB — BASIC METABOLIC PANEL WITH GFR
BUN: 19 mg/dL (ref 7–25)
CO2: 31 mmol/L (ref 20–31)
CREATININE: 1.2 mg/dL — AB (ref 0.60–0.88)
Calcium: 9.5 mg/dL (ref 8.6–10.4)
Chloride: 101 mmol/L (ref 98–110)
GFR, Est African American: 49 mL/min — ABNORMAL LOW (ref >=60)
GFR, Est Non African American: 42 mL/min — ABNORMAL LOW (ref >=60)
Glucose, Bld: 87 mg/dL (ref 65–99)
POTASSIUM: 3.7 mmol/L (ref 3.5–5.3)
SODIUM: 143 mmol/L (ref 135–146)

## 2016-12-02 LAB — LIPID PANEL
Cholesterol: 137 mg/dL (ref <200)
HDL: 56 mg/dL (ref >50)
LDL Cholesterol: 52 mg/dL (ref <100)
Total CHOL/HDL Ratio: 2.4 Ratio (ref <5.0)
Triglycerides: 147 mg/dL (ref <150)
VLDL: 29 mg/dL (ref <30)

## 2016-12-02 LAB — INSULIN, RANDOM: INSULIN: 3.1 u[IU]/mL (ref 2.0–19.6)

## 2016-12-02 LAB — MAGNESIUM: MAGNESIUM: 2.3 mg/dL (ref 1.5–2.5)

## 2016-12-02 LAB — HEMOGLOBIN A1C
Hgb A1c MFr Bld: 6.5 % — ABNORMAL HIGH (ref <5.7)
Mean Plasma Glucose: 140 mg/dL

## 2016-12-02 LAB — VITAMIN D 25 HYDROXY (VIT D DEFICIENCY, FRACTURES): VIT D 25 HYDROXY: 64 ng/mL (ref 30–100)

## 2016-12-05 ENCOUNTER — Other Ambulatory Visit: Payer: Self-pay | Admitting: Internal Medicine

## 2016-12-05 ENCOUNTER — Telehealth: Payer: Self-pay | Admitting: *Deleted

## 2016-12-05 MED ORDER — MINOXIDIL 10 MG PO TABS: ORAL_TABLET | ORAL | 1 refills | Status: DC

## 2016-12-05 NOTE — Telephone Encounter (Signed)
Patient called and states the Imdur is causing her to have a headache unrelieved by medication.  Per Dr Oneta RackMcKeown, stop the Imdur and a new RX for Minoxidil 10 mg has been sent in.  The patient is to start with 1/4 tablet x 4 days and then increase to 1/2 tablet daily.  Patient is aware.

## 2016-12-08 ENCOUNTER — Ambulatory Visit (INDEPENDENT_AMBULATORY_CARE_PROVIDER_SITE_OTHER): Payer: Medicare Other | Admitting: Internal Medicine

## 2016-12-08 ENCOUNTER — Other Ambulatory Visit: Payer: Self-pay | Admitting: *Deleted

## 2016-12-08 VITALS — BP 136/84 | HR 72 | Temp 97.4°F | Resp 16 | Ht 62.0 in | Wt 147.0 lb

## 2016-12-08 DIAGNOSIS — G44209 Tension-type headache, unspecified, not intractable: Secondary | ICD-10-CM | POA: Diagnosis not present

## 2016-12-08 DIAGNOSIS — I10 Essential (primary) hypertension: Secondary | ICD-10-CM | POA: Diagnosis not present

## 2016-12-08 MED ORDER — PRODIGY LANCETS 28G MISC: 1 refills | Status: AC

## 2016-12-08 MED ORDER — GLUCOSE BLOOD VI STRP: ORAL_STRIP | 1 refills | Status: AC

## 2016-12-08 NOTE — Patient Instructions (Addendum)
Stop the Isorbide  Continue the Minoxidil 10 mg at 1/4 tablet daily for BP,  But if BP begins to increase  - can increase to 1/2 tablet daily

## 2016-12-13 ENCOUNTER — Encounter: Payer: Self-pay | Admitting: Internal Medicine

## 2016-12-13 NOTE — Progress Notes (Signed)
ADULT [facility] ADOLESCENT INTERNAL MEDICINE Lucky CowboyWilliam Phillp Wise, [name]        Dyanne CarrelAmanda R. Steffanie Dunnollier, [name]       Terri Piedraourtney Forcucci, [name]  [facility]                [address]                [place], [place] [id number] Telephone [phone] Telefax [phone]  Subjective:    Patient ID: [name], female    DOB: [birth year], 83 y.o.   MRN: [id number]  HPI  This nice 83 yo WWF with HTN, HLD, PreDM, Vit D def returns for f/u of elevated BP 176/82 and add'n of Isorbide with her Atenolol 100 mg, Losartan 100 mg and Verapamil 120 mgm and she apparently developed HA and the Isorbide was to be stopped with Minoxidil 2.5 mg added. Apparently, she misunderstood & continued the Isorbide and review of BP finds all normal readings.   Medication Sig  . aspirin 81 MG tablet Take 81 mg by mouth daily.  [name] Kitchen. atenolol  100 MG tablet take 1 tablet by mouth once daily  . atorvastatin 80 MG tablet take 1 tablet by mouth once daily  . Biotin 10 MG CAPS Take 1 tablet by mouth daily.   [name] Kitchen. VITAMIN D 1000 UNITS  Take 1,000 Units by mouth daily.  . citalopram  40 MG tablet take 1 tablet by mouth once daily  . COLACE 100 MG capsule Take 1 capsule (100 mg total) by mouth 3 (three) times daily as needed.  . furosemide  40 MG tablet take 1 tablet by mouth once daily  . lansoprazole 15 MG capsule Take 15 mg by mouth daily at 12 noon.  [name] Kitchen. losartan  100 MG tablet take 1 tablet by mouth once daily  . Magnesium 400 MG CAPS Take 1 capsule by mouth daily.   [name] Kitchen. mexiletine 150 MG capsule Take 150 mg by mouth daily. )  . minoxidil  10 MG tablet Take 1/2 to 1 tablet daily for BP  . Multiple Vitamins-Minerals  Take 1 tablet by mouth daily.  [name] Kitchen. SYNTHROID 100 MCG tablet take 1 tablet by mouth once daily  . traZODone  100 MG tablet take 3 tablets by mouth at bedtime  . verapamil  120 MG 24 hr  take 1 capsule by mouth daily  . cloNIDine  0.1 MG tablet Take 1 tablet (0.1 mg total) by mouth 2 (two)  times daily.  . isosorbide (IMDUR) 60 MG Take 1/2 to 1 tablet daily as directed for BP   Allergies  Allergen Reactions  . Codeine Rash    rash  . Vasotec [Enalapril] Other (See Comments)    cough  . Augmentin [Amoxicillin-Pot Clavulanate] Other (See Comments)    vaginitis  . Hytrin [Terazosin] Other (See Comments)    incontinence   . Oxycodone Rash    rash  . Procardia [Nifedipine] Other (See Comments)    edema   Past Medical History:  Diagnosis Date  . Anemia   . Anxiety   . Arthritis   . Constipation   . Depression   . GERD (gastroesophageal reflux disease)   . History of blood transfusion   . History of toe surgery left  . Hyperlipidemia   . Hypertension   . Hypothyroidism   . Insomnia   . Thyroid disease   . Type II or unspecified type diabetes mellitus with renal manifestations, not stated as uncontrolled(250.40)  stage II, does not take medication  . Wears glasses    Past Surgical History:  Procedure Laterality Date  . ABDOMINAL HYSTERECTOMY    . APPENDECTOMY    . BACK SURGERY  02/06/2016  . CARPAL TUNNEL RELEASE Left 07/08/2016   Procedure: CARPAL TUNNEL RELEASE;  Surgeon: Jones Broom, MD;  Location: [facility];  Service: Orthopedics;  Laterality: Left;  Left carpal tunnel release  . CEREBRAL ANEURYSM REPAIR  09/2007   coil ablation  . CHOLECYSTECTOMY  2012   lap choli  . COLONOSCOPY    . GANGLION CYST EXCISION Left 2008   lt ring finger  . HAMMER TOE SURGERY  2008   osteotomy=left foot  . JOINT REPLACEMENT Left 2012   left total knee  . SHOULDER ARTHROSCOPY WITH ROTATOR CUFF REPAIR AND SUBACROMIAL DECOMPRESSION Right 01/29/2015   Procedure: RIGHT SHOULDER ARTHROSCOPY WITH DEBRIDEMENT ROTATOR CUFF TEAR, SUBACROMIAL DECOMPRESSION, DISTAL CLAVICAL RESECTION;  Surgeon: Jones Broom, MD;  Location: [facility];  Service: Orthopedics;  Laterality: Right;  Right shoulder arthroscopy debridement rotatoro cuff tear,  subacromial decompression, distal clavical resection  . TONSILLECTOMY    . TOTAL KNEE ARTHROPLASTY Right 2005   right  . TOTAL KNEE REVISION Right 2007   right   Review of Systems  10 point systems review negative except as above.    Objective:   Physical Exam  BP 136/84   Pulse 72   Temp 97.4 F (36.3 C)   Resp 16   Ht 5\' 2"  (1.575 m)   Wt 147 lb (66.7 kg)   BMI 26.89 kg/m   HEENT - WNL Neck - supple. Nl Thyroid. Carotids 2+ & No bruits, nodes, JVD Chest - Clear equal BS. Cor - Nl HS. RRR w/o sig M.  No edema. MS- FROM w/o deformities. Gait Nl. Neuro -  Nl w/o focal abnormalities.    Assessment & Plan:   1. Essential hypertension  - advised d/c the Isorbide and  - continue Minoxidil 2.5 mg and  - continue to monitor BP's  2. Tension vascular headache

## 2016-12-15 ENCOUNTER — Telehealth: Payer: Self-pay | Admitting: *Deleted

## 2016-12-15 NOTE — Telephone Encounter (Signed)
Per Dr Oneta RackMcKeown, the patient was called to check on her BP readings.  The patient's current BP was 130/79 and she states it has been lower than that.  She states she is taking Minoxidil 10 mg 1/4  tablet and has not increased the dose.

## 2016-12-17 ENCOUNTER — Other Ambulatory Visit: Payer: Self-pay | Admitting: Internal Medicine

## 2017-01-05 ENCOUNTER — Other Ambulatory Visit: Payer: Self-pay | Admitting: Internal Medicine

## 2017-01-09 DIAGNOSIS — H02403 Unspecified ptosis of bilateral eyelids: Secondary | ICD-10-CM | POA: Diagnosis not present

## 2017-01-09 DIAGNOSIS — H02831 Dermatochalasis of right upper eyelid: Secondary | ICD-10-CM | POA: Diagnosis not present

## 2017-01-09 DIAGNOSIS — H02834 Dermatochalasis of left upper eyelid: Secondary | ICD-10-CM | POA: Diagnosis not present

## 2017-01-14 ENCOUNTER — Other Ambulatory Visit: Payer: Self-pay | Admitting: Internal Medicine

## 2017-02-06 ENCOUNTER — Other Ambulatory Visit: Payer: Self-pay | Admitting: Internal Medicine

## 2017-02-12 ENCOUNTER — Other Ambulatory Visit: Payer: Self-pay | Admitting: Internal Medicine

## 2017-02-19 DIAGNOSIS — H02403 Unspecified ptosis of bilateral eyelids: Secondary | ICD-10-CM | POA: Diagnosis not present

## 2017-02-19 DIAGNOSIS — H02831 Dermatochalasis of right upper eyelid: Secondary | ICD-10-CM | POA: Diagnosis not present

## 2017-02-19 DIAGNOSIS — H02834 Dermatochalasis of left upper eyelid: Secondary | ICD-10-CM | POA: Diagnosis not present

## 2017-02-19 DIAGNOSIS — H53453 Other localized visual field defect, bilateral: Secondary | ICD-10-CM | POA: Diagnosis not present

## 2017-02-28 NOTE — Progress Notes (Signed)
Patient ID: [name], female   DOB: Apr 28, 1933, 83 y.o.   MRN: [id number]    MEDICARE ANNUAL WELLNESS VISIT AND 3 MONTH FOLLOW UP  Assessment:     Depression, remission partial -cont meds in remission   Essential hypertension -cont meds -dash diet -exercise -go to ER for chest pain, SOB, or focal weakness   GERD -foods to avoid discussed  Hypothyroidism, unspecified hypothyroidism type -cont meds -TSH checks q26mo   Osteoarthritis, unspecified osteoarthritis type, unspecified site -cont meds  CKD stage 3 due to type 2 diabetes mellitus -cont meds -diet and exercise -regularly check A1C and insulin levels  Mixed hyperlipidemia -cont meds -regularly check lipid panel   Vitamin D deficiency -cont meds -monitor levels  Medication management -CBC, Mg, Bmet, and LFTs  Essential tremor With intention x years, declines meds at this time, no focal neuro  Diarrhea Will avoid lactose, if not better follow up in office No red flags  Future Appointments Date Time Provider Department Center  06/08/2017 3:00 PM [name], MD [facility] None     Plan:   During the course of the visit the patient was educated and counseled about appropriate screening and preventive services including:    Pneumococcal vaccine   Influenza vaccine  Td vaccine  Screening electrocardiogram  Bone densitometry screening  Colorectal cancer screening  Diabetes screening  Glaucoma screening  Nutrition counseling   Advanced directives: requested   Subjective:   [name] is a 83 y.o.  female who presents for Medicare Annual Wellness Visit and 3 month follow up.  She has had elevated blood pressure. Her blood pressure has been controlled at home, & today their BP is BP: 120/70. Has started to have tremor bilateral hand with intention, last several years worse last few months, now bilateral hands ,difficult to write. No changes with speech.  She does workout ut is  limited by back pain, follows Dr. Regino Schultze, has history of PVC's, follows Dr. Delton See, recent Echo 01/2015. She denies chest pain, shortness of breath, dizziness.  She is on cholesterol medication and denies myalgias. Her cholesterol is at goal. The cholesterol last visit was:  Lab Results  Component Value Date   CHOL 137 12/01/2016   HDL 56 12/01/2016   LDLCALC 52 12/01/2016   TRIG 147 12/01/2016   CHOLHDL 2.4 12/01/2016    She has had diabetes with CKD, she is on bASA, she is on ARB She has been working on diet and exercise for diabetes, and denies foot ulcerations, hyperglycemia, hypoglycemia , increased appetite, nausea, paresthesia of the feet, polydipsia, polyuria, visual disturbances, vomiting and weight loss. Last A1C in the office was:  Lab Results  Component Value Date   HGBA1C 6.5 (H) 12/01/2016    Patient is on Vitamin D supplement.   Lab Results  Component Value Date   VD25OH 64 12/01/2016     BMI is Body mass index is 27.44 kg/m., she is working on diet and exercise. Wt Readings from Last 3 Encounters:  03/02/17 150 lb (68 kg)  12/08/16 147 lb (66.7 kg)  12/01/16 146 lb 9.6 oz (66.5 kg)   She is on thyroid medication. Her medication was not changed last visit.   Lab Results  Component Value Date   TSH 1.08 12/01/2016  .   Names of Other Physician/Practitioners you currently use: 1. [facility] here for primary care 2. [facility], Dr. Burgess Estelle, eye doctor, last April 2018 3. Dr. Olegario Shearer, dentist,  last visit 6 months ago, has OV monday  Patient Care Team: [name], MD as PCP - General (Internal Medicine)  Medication Review: Current Outpatient Prescriptions on File Prior to Visit  Medication Sig Dispense Refill  . aspirin 81 MG tablet Take 81 mg by mouth daily.    [name] Kitchen atenolol (TENORMIN) 100 MG tablet take 1 tablet by mouth once daily 90 tablet 1  . atenolol (TENORMIN) 100 MG tablet take 1 tablet by mouth once  daily 120 tablet 0  . atorvastatin (LIPITOR) 80 MG tablet take 1 tablet by mouth once daily 90 tablet 1  . Biotin 10 MG CAPS Take 1 tablet by mouth daily.     . cholecalciferol (VITAMIN D) 1000 UNITS tablet Take 1,000 Units by mouth daily.    . citalopram (CELEXA) 40 MG tablet take 1 tablet by mouth once daily 180 tablet 1  . docusate sodium (COLACE) 100 MG capsule Take 1 capsule (100 mg total) by mouth 3 (three) times daily as needed. 20 capsule 0  . furosemide (LASIX) 40 MG tablet take 1 tablet by mouth once daily 90 tablet 1  . gabapentin (NEURONTIN) 100 MG capsule take 1 capsule by mouth WITH BREAKFAST AND LUNCH TO HELP WITH LEG PAIN 180 capsule 1  . glucose blood test strip Check blood sugar 1 time daily-DX-E11.22. 100 each 1  . lansoprazole (PREVACID) 15 MG capsule Take 15 mg by mouth daily at 12 noon.    [name] Kitchen losartan (COZAAR) 100 MG tablet take 1 tablet by mouth once daily 90 tablet 1  . Magnesium 400 MG CAPS Take 1 capsule by mouth daily.     [name] Kitchen mexiletine (MEXITIL) 150 MG capsule take 1 capsule by mouth three times a day 270 capsule 1  . minoxidil (LONITEN) 10 MG tablet Take 1/2 to 1 tablet daily for BP 90 tablet 1  . Multiple Vitamins-Minerals (MULTIVITAMIN WITH MINERALS) tablet Take 1 tablet by mouth daily.    [name] Kitchen PRODIGY LANCETS 28G MISC Check blood sugar 1 time daily-DX-E11.22. 100 each 1  . SYNTHROID 100 MCG tablet take 1 tablet by mouth once daily 90 tablet 4  . traZODone (DESYREL) 100 MG tablet take 3 tablets by mouth at bedtime 270 tablet 1  . verapamil (VERELAN PM) 120 MG 24 hr capsule take 1 capsule by mouth twice a day with food 180 capsule 1  . cloNIDine (CATAPRES) 0.1 MG tablet Take 1 tablet (0.1 mg total) by mouth 2 (two) times daily. 60 tablet 0   No current facility-administered medications on file prior to visit.     Current Problems (verified) Patient Active Problem List   Diagnosis Date Noted  . Gastroesophageal reflux disease without esophagitis 12/01/2016  .  Radiculopathy 02/06/2016  . BMI 28.0-28.9,adult 10/02/2015  . Medicare annual wellness visit, subsequent 10/02/2015  . Depression, major, recurrent, in partial remission (HCC) 02/15/2015  . Vitamin D deficiency 06/14/2014  . Medication management 06/14/2014  . CKD stage 3 due to type 2 diabetes mellitus (HCC) 06/14/2014  . Hypothyroidism 12/02/2013  . Essential hypertension 12/02/2013  . DJD 12/02/2013  . Mixed hyperlipidemia 12/02/2013    Screening Tests Immunization History  Administered Date(s) Administered  . Influenza, High Dose Seasonal PF 09/05/2014, 10/02/2015, 07/24/2016  . Influenza-Unspecified 09/10/2013  . Pneumococcal Conjugate-13 07/24/2016  . Pneumococcal-Unspecified 11/26/2012  . Td 11/26/2012  . Tdap 08/24/2014    Preventative care: Last colonoscopy: 2011  Immunization History  Administered Date(s) Administered  . Influenza, High Dose Seasonal PF 09/05/2014, 10/02/2015, 07/24/2016  .  Influenza-Unspecified 09/10/2013  . Pneumococcal Conjugate-13 07/24/2016  . Pneumococcal-Unspecified 11/26/2012  . Td 11/26/2012  . Tdap 08/24/2014    History reviewed: allergies, current medications, past family history, past medical history, past social history, past surgical history and problem list Allergies Allergies  Allergen Reactions  . Codeine Rash    rash  . Vasotec [Enalapril] Other (See Comments)    cough  . Augmentin [Amoxicillin-Pot Clavulanate] Other (See Comments)    vaginitis  . Hytrin [Terazosin] Other (See Comments)    incontinence   . Oxycodone Rash    rash  . Procardia [Nifedipine] Other (See Comments)    edema    SURGICAL HISTORY She  has a past surgical history that includes Abdominal hysterectomy; Tonsillectomy; Appendectomy; Cerebral aneurysm repair (09/2007); Joint replacement (Left, 2012); Total knee arthroplasty (Right, 2005); Total knee revision (Right, 2007); Hammer toe surgery (2008); Ganglion cyst excision (Left, 2008);  Cholecystectomy (2012); Colonoscopy; Shoulder arthroscopy with rotator cuff repair and subacromial decompression (Right, 01/29/2015); Back surgery (02/06/2016); and Carpal tunnel release (Left, 07/08/2016). FAMILY HISTORY Her family history includes Cancer in her father, maternal aunt, and mother; Hypertension in her father and mother; Mental illness in her mother. SOCIAL HISTORY She  reports that she has never smoked. She has never used smokeless tobacco. She reports that she drinks alcohol. She reports that she does not use drugs. MEDICARE WELLNESS OBJECTIVES: Physical activity: Current Exercise Habits: Home exercise routine, Time (Minutes): 20, Frequency (Times/Week): 7, Weekly Exercise (Minutes/Week): 140, Intensity: Mild Cardiac risk factors: Cardiac Risk Factors include: advanced age (>85men, >72 women);diabetes mellitus;dyslipidemia;hypertension;sedentary lifestyle Depression/mood screen:   Depression screen [facility] 2/9 03/02/2017  Decreased Interest 0  Down, Depressed, Hopeless 0  PHQ - 2 Score 0    ADLs:  In your present state of health, do you have any difficulty performing the following activities: 03/02/2017 12/01/2016  Hearing? N N  Vision? N N  Difficulty concentrating or making decisions? N N  Walking or climbing stairs? N N  Dressing or bathing? N N  Doing errands, shopping? N N  Some recent data might be hidden     Cognitive Testing  Alert? Yes  Normal Appearance?Yes  Oriented to person? Yes  Place? Yes   Time? Yes  Recall of three objects?  Yes  Can perform simple calculations? Yes  Displays appropriate judgment?Yes  Can read the correct time from a watch face?Yes  EOL planning: Does Patient Have a Medical Advance Directive?: Yes Type of Advance Directive: Healthcare Power of Attorney, Living will Copy of Healthcare Power of Attorney in Chart?: No - copy requested  Review of Systems  Constitutional: Negative for chills, fever and malaise/fatigue.  HENT: Negative for  congestion, ear discharge, ear pain, sore throat and tinnitus.   Eyes: Negative.   Respiratory: Negative for cough, sputum production, shortness of breath and wheezing.   Cardiovascular: Negative for chest pain, palpitations and leg swelling.  Gastrointestinal: Positive for diarrhea. Negative for abdominal pain, blood in stool, constipation, heartburn, melena, nausea and vomiting.  Genitourinary: Negative for dysuria, frequency, hematuria and urgency.  Musculoskeletal: Negative.   Neurological: Positive for tremors. Negative for dizziness, tingling, sensory change, speech change, focal weakness, seizures, loss of consciousness, weakness and headaches.  Psychiatric/Behavioral: Negative for memory loss, substance abuse and suicidal ideas. The patient is not nervous/anxious and does not have insomnia.     Objective:     BP 120/70   Pulse 63   Temp 97.3 F (36.3 C)   Resp 14   Ht 5'  2" (1.575 m)   Wt 150 lb (68 kg)   SpO2 95%   BMI 27.44 kg/m   General Appearance: Well nourished, alert, WD/WN, female and in no apparent distress. Eyes: PERRLA, EOMs, conjunctiva no swelling or erythema, normal fundi and vessels. Sinuses: No frontal/maxillary tenderness ENT/Mouth: EACs patent / TMs  nl. Nares clear without erythema, swelling, mucoid exudates. Oral hygiene is good. No erythema, swelling, or exudate. Tongue normal, non-obstructing. Tonsils not swollen or erythematous. Hearing normal.  Neck: Supple, thyroid normal. No bruits, nodes or JVD. Respiratory: Respiratory effort normal.  BS equal and clear bilateral without rales, rhonci, wheezing or stridor. Cardio: Heart sounds are normal with regular rate and rhythm and there is a murmur  Heard best in the second LICS,  No rubs or gallops. Peripheral pulses are normal and equal bilaterally without edema. No aortic or femoral bruits. Chest: symmetric with normal excursions and percussion. Abdomen: Flat, soft  with nl bowel sounds. Nontender, no  guarding, rebound, hernias, masses, or organomegaly.  Lymphatics: Non tender without lymphadenopathy.  Musculoskeletal: Full ROM all peripheral extremities, joint stability, 5/5 strength, and normal gait.  Right arm in sling Skin: Warm and dry without rashes, lesions, cyanosis, clubbing or  ecchymosis.  Neuro: Cranial nerves intact, reflexes equal bilaterally. Normal muscle tone, no cerebellar symptoms. Sensation intact.  Pysch: Alert and oriented X 3, normal affect, Insight and Judgment appropriate.    Medicare Attestation I have personally reviewed: The patient's medical and social history Their use of alcohol, tobacco or illicit drugs Their current medications and supplements The patient's functional ability including ADLs,fall risks, home safety risks, cognitive, and hearing and visual impairment Diet and physical activities Evidence for depression or mood disorders  The patient's weight, height, BMI, and visual acuity have been recorded in the chart.  I have made referrals, counseling, and provided education to the patient based on review of the above and I have provided the patient with a written personalized care plan for preventive services.  Over 40 minutes of exam, counseling, chart review was performed.   Quentin Mulling, PA-C   03/02/2017

## 2017-03-02 ENCOUNTER — Encounter: Payer: Self-pay | Admitting: Physician Assistant

## 2017-03-02 ENCOUNTER — Ambulatory Visit (INDEPENDENT_AMBULATORY_CARE_PROVIDER_SITE_OTHER): Payer: Medicare Other | Admitting: Physician Assistant

## 2017-03-02 VITALS — BP 120/70 | HR 63 | Temp 97.3°F | Resp 14 | Ht 62.0 in | Wt 150.0 lb

## 2017-03-02 DIAGNOSIS — E039 Hypothyroidism, unspecified: Secondary | ICD-10-CM | POA: Diagnosis not present

## 2017-03-02 DIAGNOSIS — N183 Chronic kidney disease, stage 3 unspecified: Secondary | ICD-10-CM

## 2017-03-02 DIAGNOSIS — E559 Vitamin D deficiency, unspecified: Secondary | ICD-10-CM

## 2017-03-02 DIAGNOSIS — E1122 Type 2 diabetes mellitus with diabetic chronic kidney disease: Secondary | ICD-10-CM | POA: Diagnosis not present

## 2017-03-02 DIAGNOSIS — R6889 Other general symptoms and signs: Secondary | ICD-10-CM | POA: Diagnosis not present

## 2017-03-02 DIAGNOSIS — E782 Mixed hyperlipidemia: Secondary | ICD-10-CM | POA: Diagnosis not present

## 2017-03-02 DIAGNOSIS — Z0000 Encounter for general adult medical examination without abnormal findings: Secondary | ICD-10-CM

## 2017-03-02 DIAGNOSIS — K219 Gastro-esophageal reflux disease without esophagitis: Secondary | ICD-10-CM

## 2017-03-02 DIAGNOSIS — M1990 Unspecified osteoarthritis, unspecified site: Secondary | ICD-10-CM

## 2017-03-02 DIAGNOSIS — Z0001 Encounter for general adult medical examination with abnormal findings: Secondary | ICD-10-CM

## 2017-03-02 DIAGNOSIS — Z79899 Other long term (current) drug therapy: Secondary | ICD-10-CM | POA: Diagnosis not present

## 2017-03-02 DIAGNOSIS — I10 Essential (primary) hypertension: Secondary | ICD-10-CM

## 2017-03-02 DIAGNOSIS — G250 Essential tremor: Secondary | ICD-10-CM

## 2017-03-02 DIAGNOSIS — M5410 Radiculopathy, site unspecified: Secondary | ICD-10-CM

## 2017-03-02 DIAGNOSIS — F3341 Major depressive disorder, recurrent, in partial remission: Secondary | ICD-10-CM

## 2017-03-02 LAB — BASIC METABOLIC PANEL WITH GFR
BUN: 23 mg/dL (ref 7–25)
CALCIUM: 9.5 mg/dL (ref 8.6–10.4)
CHLORIDE: 102 mmol/L (ref 98–110)
CO2: 28 mmol/L (ref 20–31)
CREATININE: 1.21 mg/dL — AB (ref 0.60–0.88)
GFR, Est African American: 48 mL/min — ABNORMAL LOW (ref >=60)
GFR, Est Non African American: 42 mL/min — ABNORMAL LOW (ref >=60)
Glucose, Bld: 112 mg/dL — ABNORMAL HIGH (ref 65–99)
Potassium: 3.9 mmol/L (ref 3.5–5.3)
SODIUM: 142 mmol/L (ref 135–146)

## 2017-03-02 LAB — CBC WITH DIFFERENTIAL/PLATELET
Basophils Absolute: 83 cells/uL (ref 0–200)
Basophils Relative: 1 %
EOS ABS: 249 {cells}/uL (ref 15–500)
EOS PCT: 3 %
HEMATOCRIT: 35.5 % (ref 35.0–45.0)
HEMOGLOBIN: 11.4 g/dL — AB (ref 11.7–15.5)
LYMPHS ABS: 1992 {cells}/uL (ref 850–3900)
Lymphocytes Relative: 24 %
MCH: 30.2 pg (ref 27.0–33.0)
MCHC: 32.1 g/dL (ref 32.0–36.0)
MCV: 93.9 fL (ref 80.0–100.0)
MPV: 9.7 fL (ref 7.5–12.5)
Monocytes Absolute: 581 cells/uL (ref 200–950)
Monocytes Relative: 7 %
NEUTROS PCT: 65 %
Neutro Abs: 5395 cells/uL (ref 1500–7800)
Platelets: 309 10*3/uL (ref 140–400)
RBC: 3.78 MIL/uL — ABNORMAL LOW (ref 3.80–5.10)
RDW: 13.4 % (ref 11.0–15.0)
WBC: 8.3 10*3/uL (ref 3.8–10.8)

## 2017-03-02 LAB — HEPATIC FUNCTION PANEL
ALK PHOS: 64 U/L (ref 33–130)
ALT: 11 U/L (ref 6–29)
AST: 20 U/L (ref 10–35)
Albumin: 4.3 g/dL (ref 3.6–5.1)
BILIRUBIN INDIRECT: 0.3 mg/dL (ref 0.2–1.2)
BILIRUBIN TOTAL: 0.4 mg/dL (ref 0.2–1.2)
Bilirubin, Direct: 0.1 mg/dL (ref <=0.2)
TOTAL PROTEIN: 6.9 g/dL (ref 6.1–8.1)

## 2017-03-02 LAB — LIPID PANEL
CHOLESTEROL: 134 mg/dL (ref <200)
HDL: 59 mg/dL (ref >50)
LDL CALC: 55 mg/dL (ref <100)
TRIGLYCERIDES: 102 mg/dL (ref <150)
Total CHOL/HDL Ratio: 2.3 Ratio (ref <5.0)
VLDL: 20 mg/dL (ref <30)

## 2017-03-02 LAB — TSH: TSH: 1.15 mIU/L

## 2017-03-02 NOTE — Patient Instructions (Addendum)
Essential Tremor A tremor is trembling or shaking that you cannot control. Most tremors affect the hands or arms. Tremors can also affect the head, vocal cords, face, and other parts of the body. Essential tremor is a tremor without a known cause. What are the causes? Essential tremor has no known cause. What increases the risk? You may be at greater risk of essential tremor if:  You have a family member with essential tremor.  You are age 81 or older.  You take certain medicines. What are the signs or symptoms? The main sign of a tremor is uncontrolled and unintentional rhythmic shaking of a body part.  You may have difficulty eating with a spoon or fork.  You may have difficulty writing.  You may nod your head up and down or side to side.  You may have a quivering voice. Your tremors:  May get worse over time.  May come and go.  May be more noticeable on one side of your body.  May get worse due to stress, fatigue, caffeine, and extreme heat or cold. How is this diagnosed? Your health care provider can diagnose essential tremor based on your symptoms, medical history, and a physical examination. There is no single test to diagnose an essential tremor. However, your health care provider may perform a variety of tests to rule out other conditions. Tests may include:  Blood and urine tests.  Imaging studies of your brain, such as:  CT scan.  MRI.  A test that measures involuntary muscle movement (electromyogram). How is this treated? Your tremors may go away without treatment. Mild tremors may not need treatment if they do not affect your day-to-day life. Severe tremors may need to be treated using one or a combination of the following options:  Medicines. This may include medicine that is injected.  Lifestyle changes.  Physical therapy. Follow these instructions at home:  Take medicines only as directed by your health care provider.  Limit alcohol intake to no  more than 1 drink per day for nonpregnant women and 2 drinks per day for men. One drink equals 12 oz of beer, 5 oz of wine, or 1 oz of hard liquor.  Do not use any tobacco products, including cigarettes, chewing tobacco, or electronic cigarettes. If you need help quitting, ask your health care provider.  Take medicines only as directed by your health care provider.  Avoid extreme heat or cold.  Limit the amount of caffeine you consumeas directed by your health care provider.  Try to get eight hours of sleep each night.  Find ways to manage your stress, such as meditation or yoga.  Keep all follow-up visits as directed by your health care provider. This is important. This includes any physical therapy visits. Contact a health care provider if:  You experience any changes in the location or intensity of your tremors.  You start having a tremor after starting a new medicine.  You have tremor with other symptoms such as:  Numbness.  Tingling.  Pain.  Weakness.  Your tremor gets worse.  Your tremor interferes with your daily life. This information is not intended to replace advice given to you by your health care provider. Make sure you discuss any questions you have with your health care provider. Document Released: 11/17/2014 Document Revised: 04/03/2016 Document Reviewed: 04/24/2014 Elsevier Interactive Patient Education  2017 [employer]   Diarrhea, Adult Diarrhea is frequent loose and watery bowel movements. Diarrhea can make you feel weak and cause you  to become dehydrated. Dehydration can make you tired and thirsty, cause you to have a dry mouth, and decrease how often you urinate. Diarrhea typically lasts 2-3 days. However, it can last longer if it is a sign of something more serious. It is important to treat your diarrhea as told by your health care provider. Follow these instructions at home: Eating and drinking   Follow these recommendations as told by your  health care provider:  Take an oral rehydration solution (ORS). This is a drink that is sold at pharmacies and retail stores.  Drink clear fluids, such as water, ice chips, diluted fruit juice, and low-calorie sports drinks.  Eat bland, easy-to-digest foods in small amounts as you are able. These foods include bananas, applesauce, rice, lean meats, toast, and crackers.  Avoid drinking fluids that contain a lot of sugar or caffeine, such as energy drinks, sports drinks, and soda.  Avoid alcohol.  Avoid spicy or fatty foods. General instructions   Drink enough fluid to keep your urine clear or pale yellow.  Wash your hands often. If soap and water are not available, use hand sanitizer.  Make sure that all people in your household wash their hands well and often.  Take over-the-counter and prescription medicines only as told by your health care provider.  Rest at home while you recover.  Watch your condition for any changes.  Take a warm bath to relieve any burning or pain from frequent diarrhea episodes.  Keep all follow-up visits as told by your health care provider. This is important. Contact a health care provider if:  You have a fever.  Your diarrhea gets worse.  You have new symptoms.  You cannot keep fluids down.  You feel light-headed or dizzy.  You have a headache  You have muscle cramps. Get help right away if:  You have chest pain.  You feel extremely weak or you faint.  You have bloody or black stools or stools that look like tar.  You have severe pain, cramping, or bloating in your abdomen.  You have trouble breathing or you are breathing very quickly.  Your heart is beating very quickly.  Your skin feels cold and clammy.  You feel confused.  You have signs of dehydration, such as:  Dark urine, very little urine, or no urine.  Cracked lips.  Dry mouth.  Sunken eyes.  Sleepiness.  Weakness. This information is not intended to  replace advice given to you by your health care provider. Make sure you discuss any questions you have with your health care provider. Document Released: 10/17/2002 Document Revised: 03/06/2016 Document Reviewed: 07/03/2015 Elsevier Interactive Patient Education  2017 [employer].

## 2017-03-03 LAB — HEMOGLOBIN A1C
Hgb A1c MFr Bld: 6.3 % — ABNORMAL HIGH (ref <5.7)
Mean Plasma Glucose: 134 mg/dL

## 2017-03-03 LAB — MAGNESIUM: MAGNESIUM: 2.2 mg/dL (ref 1.5–2.5)

## 2017-03-04 ENCOUNTER — Other Ambulatory Visit: Payer: Self-pay | Admitting: Internal Medicine

## 2017-03-04 NOTE — Progress Notes (Signed)
Pt aware of lab results & voiced understanding of those results.

## 2017-03-23 ENCOUNTER — Other Ambulatory Visit: Payer: Self-pay | Admitting: Internal Medicine

## 2017-03-27 ENCOUNTER — Other Ambulatory Visit: Payer: Self-pay | Admitting: Internal Medicine

## 2017-03-27 DIAGNOSIS — Z1231 Encounter for screening mammogram for malignant neoplasm of breast: Secondary | ICD-10-CM

## 2017-04-16 IMAGING — CR DG CHEST 2V
2 series · 2 of 2 positions shown · non-contrast
Comparison: 03/09/2013.

CLINICAL DATA: Lumbar spine surgery.

EXAM:
CHEST  2 VIEW

[w chest pa]
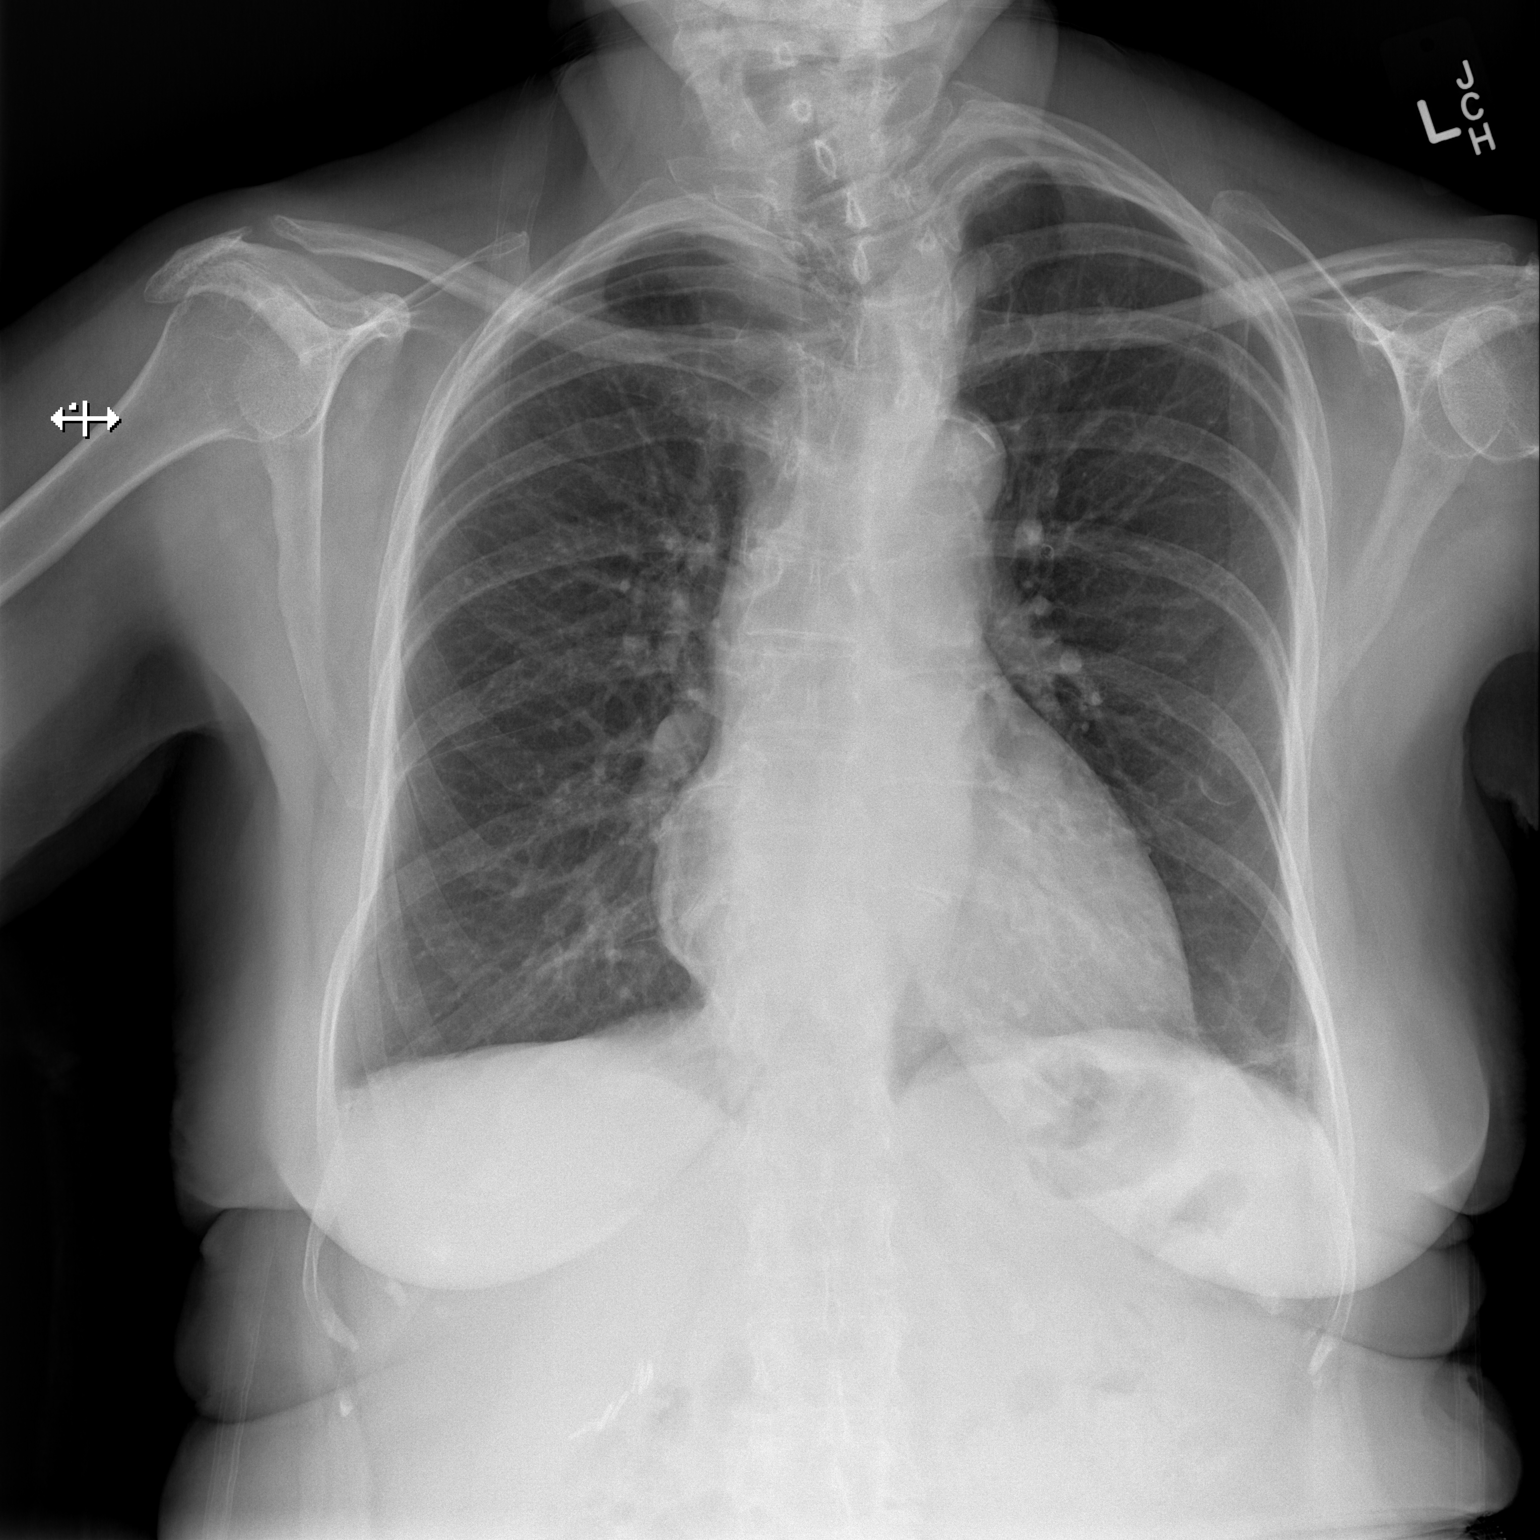

[w chest lat]
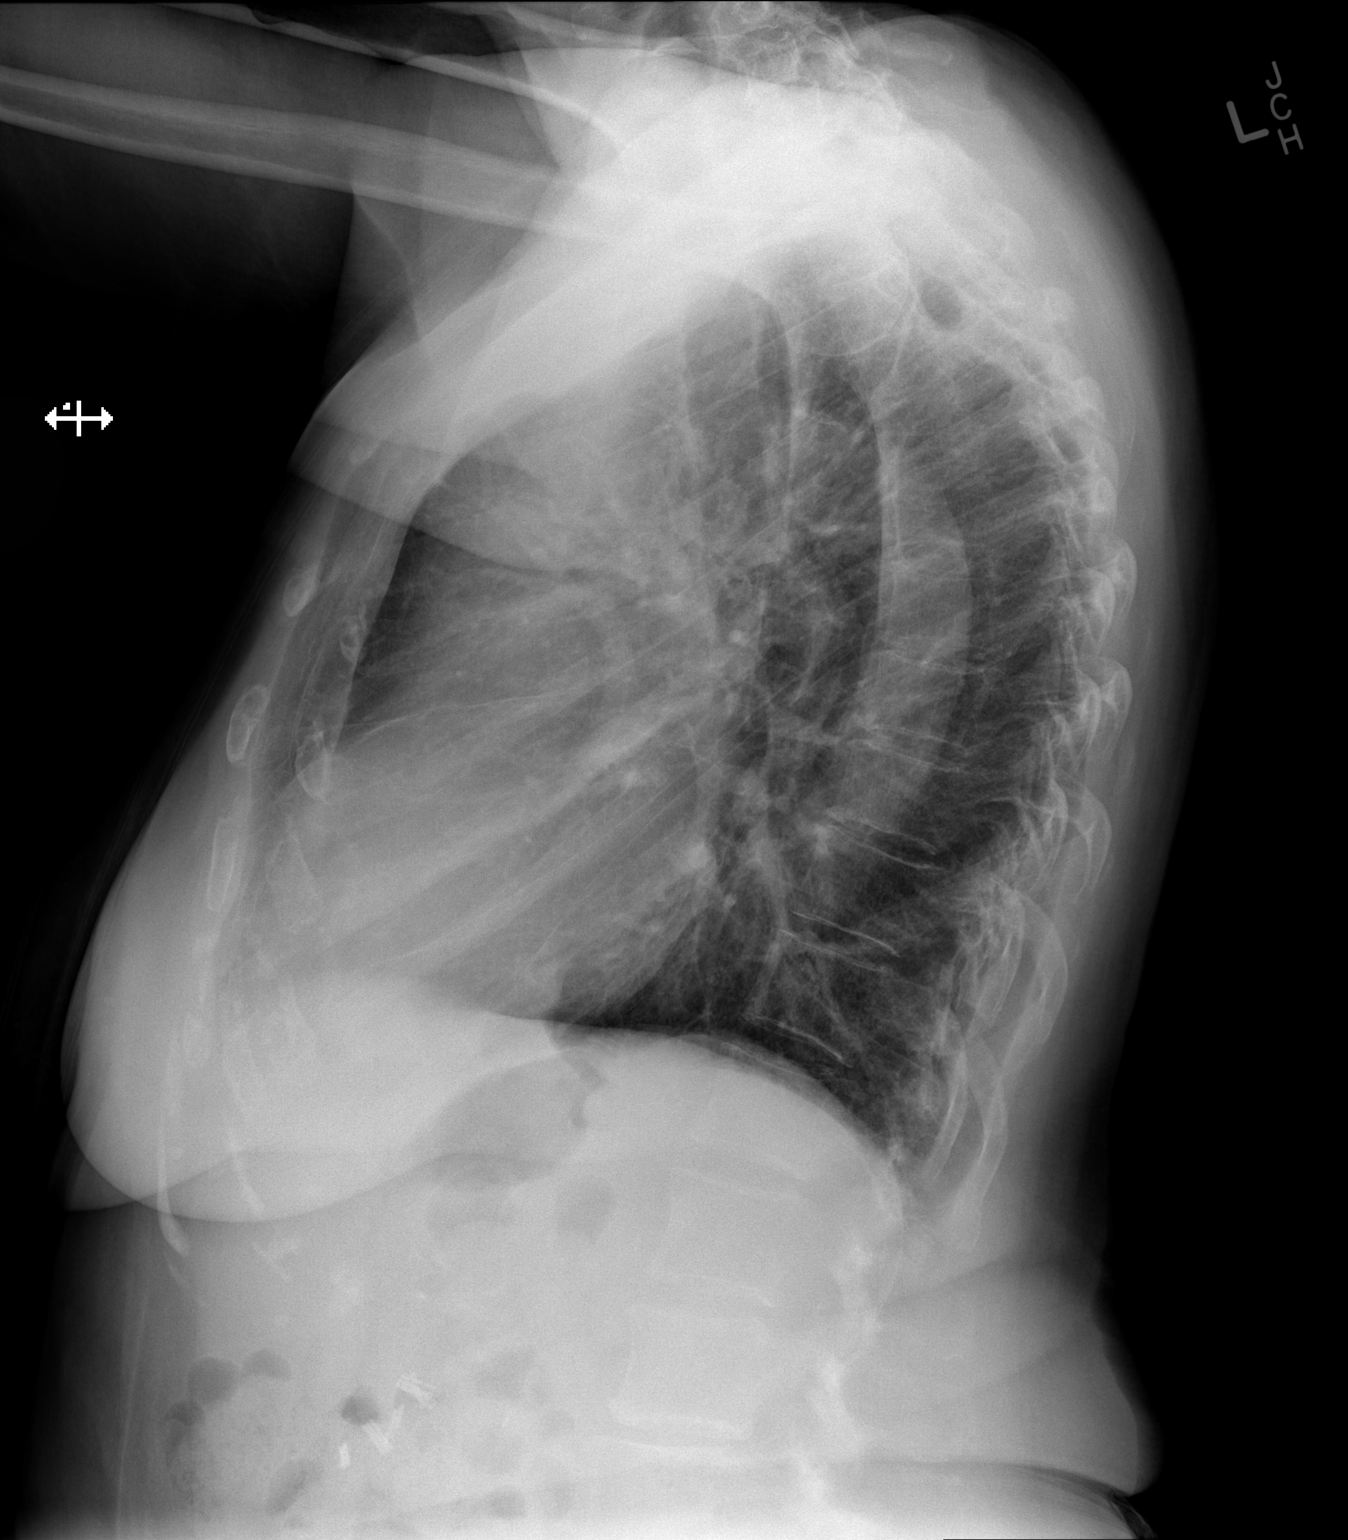

[2 of 2 positions shown; findings below may reference images not displayed]

FINDINGS: Mediastinum and hilar structures are normal. Lungs are clear. Heart
size normal. No pleural effusion or pneumothorax. Left base
subsegmental atelectasis and/or scarring. No acute bony abnormality.
Surgical clips right upper quadrant. Thoracic spine scoliosis.
IMPRESSION: 1. Left base subsegmental atelectasis and or scarring. No acute
cardiopulmonary disease.

2. Cardiomegaly.

## 2017-04-21 IMAGING — RF DG C-ARM 61-120 MIN
1 series · 2 of 2 positions shown · non-contrast
Comparison: Intraoperative radiograph same date. Lumbar MRI
01/20/2016.

CLINICAL DATA: L4-5 TLIF.

EXAM:
DG C-ARM 61-120 MIN; LUMBAR SPINE - 2-3 VIEW

[Series 1: run · 2 of 2 slices shown]
[im 1/2]
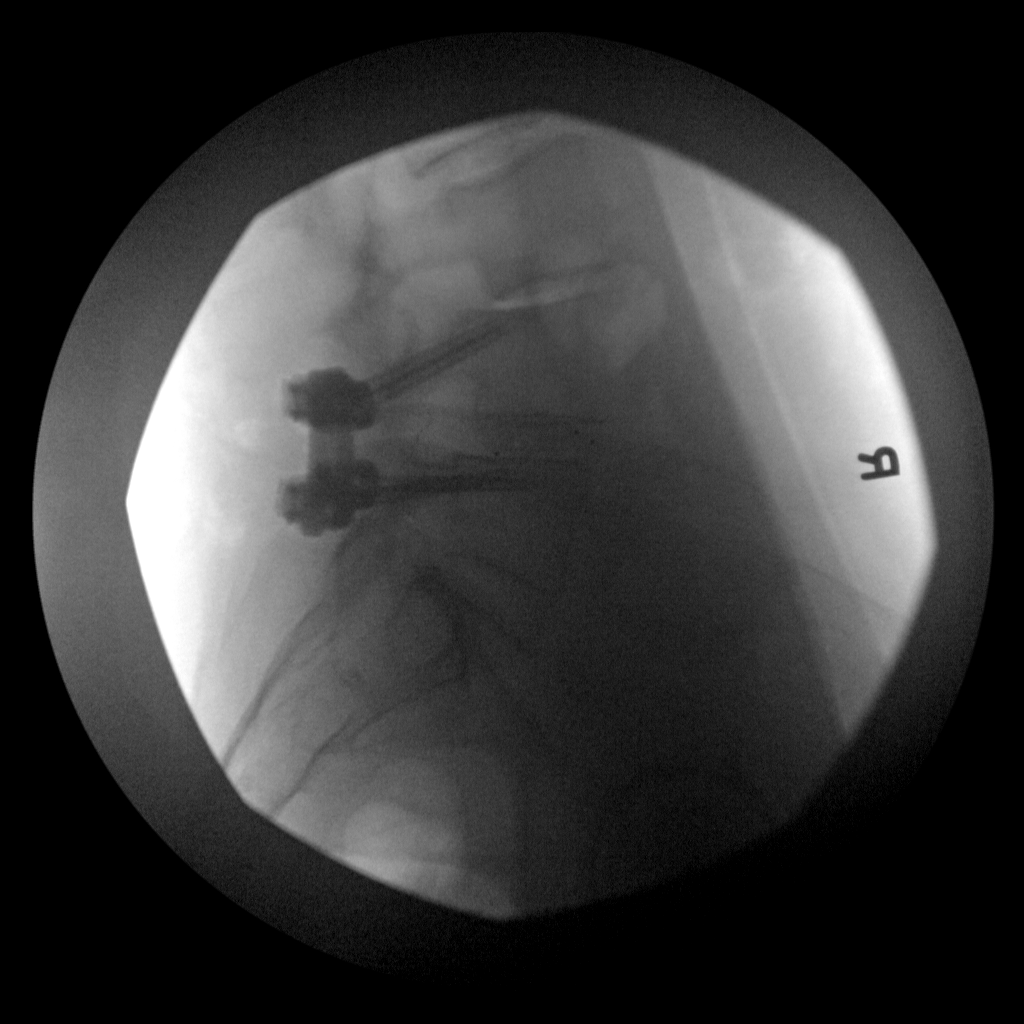
[im 2/2]
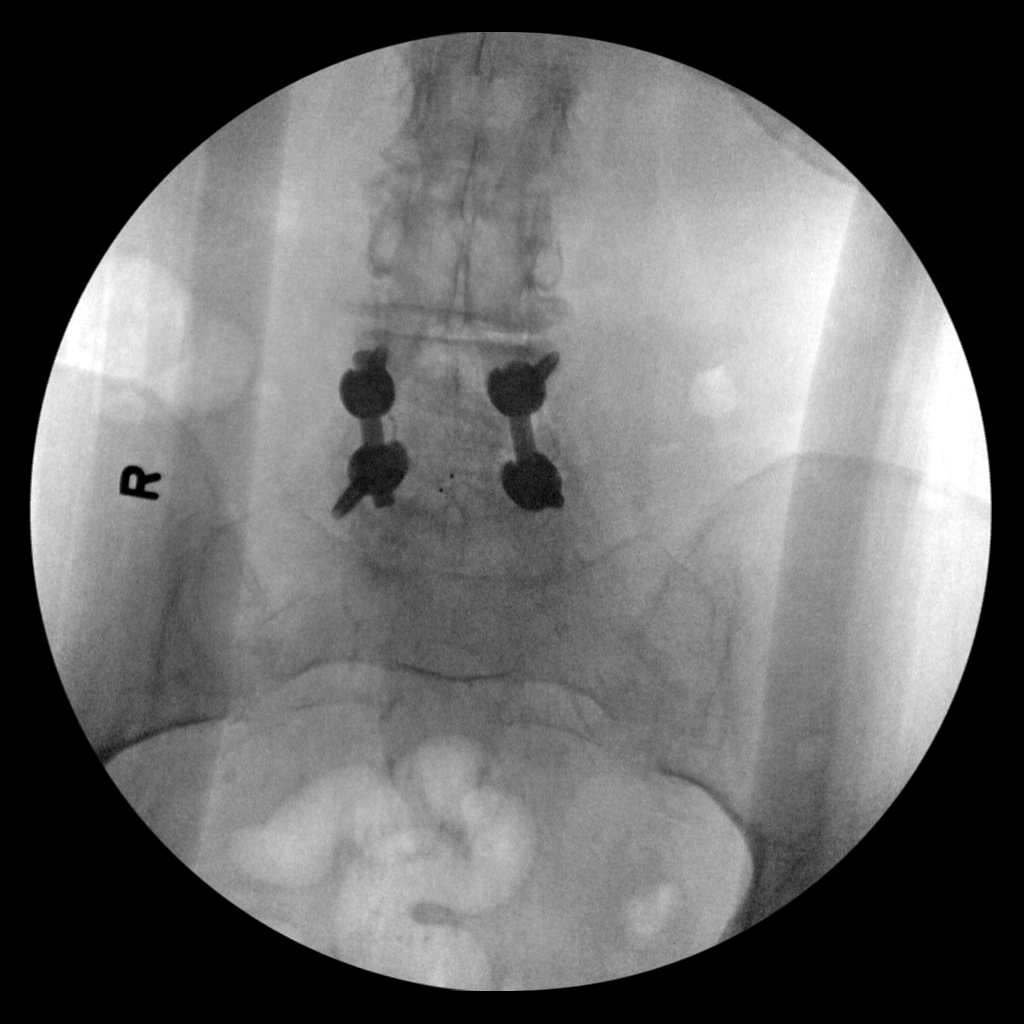

[2 of 2 positions shown; findings below may reference images not displayed]

FINDINGS: Spot PA and lateral fluoroscopic images of the lower lumbar spine
demonstrate postsurgical changes at L4-5 with bilateral pedicle
screws, interconnecting rods and interbody spacers. The hardware
appears satisfactorily positioned. There is a stable anterolisthesis
at the operative level and at L3-4. No complications identified.
IMPRESSION: Intraoperative views during L4-5 TLIF. No demonstrated complication.

## 2017-04-29 ENCOUNTER — Ambulatory Visit
Admission: RE | Admit: 2017-04-29 | Discharge: 2017-04-29 | Disposition: A | Discharge status: Home / Self Care | Payer: Medicare Other | Source: Ambulatory Visit | Attending: Internal Medicine | Admitting: Internal Medicine

## 2017-04-29 DIAGNOSIS — Z1231 Encounter for screening mammogram for malignant neoplasm of breast: Secondary | ICD-10-CM

## 2017-05-01 ENCOUNTER — Ambulatory Visit (INDEPENDENT_AMBULATORY_CARE_PROVIDER_SITE_OTHER): Payer: Medicare Other | Admitting: Internal Medicine

## 2017-05-01 VITALS — BP 122/64 | HR 60 | Temp 97.7°F | Resp 16 | Ht 62.0 in | Wt 148.4 lb

## 2017-05-01 DIAGNOSIS — S0083XA Contusion of other part of head, initial encounter: Secondary | ICD-10-CM | POA: Diagnosis not present

## 2017-05-01 DIAGNOSIS — R0789 Other chest pain: Secondary | ICD-10-CM

## 2017-05-01 DIAGNOSIS — S20219A Contusion of unspecified front wall of thorax, initial encounter: Secondary | ICD-10-CM | POA: Diagnosis not present

## 2017-05-01 MED ORDER — PREDNISONE 20 MG PO TABS: ORAL_TABLET | ORAL | 0 refills | Status: DC

## 2017-05-02 ENCOUNTER — Encounter: Payer: Self-pay | Admitting: Internal Medicine

## 2017-05-02 NOTE — Progress Notes (Signed)
Subjective:    Patient ID: [name], female    DOB: 11/08/1933, 83 y.o.   MRN: [id number]  HPI  Patient relates episode of tripping on a curb ~ 10 days previously at her dog's vet office and was checked by the staff and declined them calling EMS. She denied LOC and had no neurologic sx's. Has continued to have chest wall soreness.   Medication Sig  . aspirin 81 MG tablet Take 81 mg by mouth daily.  [name] Kitchen atenolol (TENORMIN) 100 MG tablet take 1 tablet by mouth once daily  . atorvastatin (LIPITOR) 80 MG tablet take 1 tablet by mouth once daily  . Biotin 10 MG CAPS Take 1 tablet by mouth daily.   . cholecalciferol (VITAMIN D) 1000 UNITS tablet Take 1,000 Units by mouth daily.  . citalopram (CELEXA) 40 MG tablet take 1 tablet by mouth once daily  . docusate sodium (COLACE) 100 MG capsule Take 1 capsule (100 mg total) by mouth 3 (three) times daily as needed.  . furosemide (LASIX) 40 MG tablet take 1 tablet by mouth once daily  . gabapentin (NEURONTIN) 100 MG capsule take 1 capsule by mouth WITH BREAKFAST AND LUNCH TO HELP WITH LEG PAIN  . glucose blood test strip Check blood sugar 1 time daily-DX-E11.22.  . lansoprazole (PREVACID) 15 MG capsule Take 15 mg by mouth daily at 12 noon.  [name] Kitchen losartan (COZAAR) 100 MG tablet take 1 tablet by mouth once daily  . Magnesium 400 MG CAPS Take 1 capsule by mouth daily.   [name] Kitchen mexiletine (MEXITIL) 150 MG capsule take 1 capsule by mouth three times a day  . minoxidil (LONITEN) 10 MG tablet Take 1/2 to 1 tablet daily for BP  . Multiple Vitamins-Minerals (MULTIVITAMIN WITH MINERALS) tablet Take 1 tablet by mouth daily.  [name] Kitchen PRODIGY LANCETS 28G MISC Check blood sugar 1 time daily-DX-E11.22.  [name] Kitchen SYNTHROID 100 MCG tablet take 1 tablet by mouth once daily  . traZODone (DESYREL) 100 MG tablet take 3 tablets by mouth at bedtime  . verapamil (VERELAN PM) 120 MG 24 hr capsule take 1 capsule by mouth twice a day with food  . cloNIDine (CATAPRES) 0.1 MG tablet Take 1 tablet  (0.1 mg total) by mouth 2 (two) times daily.  [name] Kitchen atenolol (TENORMIN) 100 MG tablet take 1 tablet by mouth once daily  . atenolol (TENORMIN) 100 MG tablet take 1 tablet by mouth once daily   No facility-administered medications prior to visit.    Allergies  Allergen Reactions  . Codeine Rash    rash  . Vasotec [Enalapril] Other (See Comments)    cough  . Augmentin [Amoxicillin-Pot Clavulanate] Other (See Comments)    vaginitis  . Hytrin [Terazosin] Other (See Comments)    incontinence   . Oxycodone Rash    rash  . Procardia [Nifedipine] Other (See Comments)    edema   Past Medical History:  Diagnosis Date  . Anemia   . Anxiety   . Arthritis   . Constipation   . Depression   . GERD (gastroesophageal reflux disease)   . History of blood transfusion   . History of toe surgery left  . Hyperlipidemia   . Hypertension   . Hypothyroidism   . Insomnia   . Thyroid disease   . Type II or unspecified type diabetes mellitus with renal manifestations, not stated as uncontrolled(250.40)    stage II, does not take medication  . Wears glasses    Past Surgical History:  Procedure  Laterality Date  . ABDOMINAL HYSTERECTOMY    . APPENDECTOMY    . BACK SURGERY  02/06/2016  . BREAST BIOPSY     core biopsy 1995  . CARPAL TUNNEL RELEASE Left 07/08/2016   Procedure: CARPAL TUNNEL RELEASE;  Surgeon: Jones BroomJustin Chandler, MD;  Location: [facility];  Service: Orthopedics;  Laterality: Left;  Left carpal tunnel release  . CEREBRAL ANEURYSM REPAIR  09/2007   coil ablation  . CHOLECYSTECTOMY  2012   lap choli  . COLONOSCOPY    . GANGLION CYST EXCISION Left 2008   lt ring finger  . HAMMER TOE SURGERY  2008   osteotomy=left foot  . JOINT REPLACEMENT Left 2012   left total knee  . SHOULDER ARTHROSCOPY WITH ROTATOR CUFF REPAIR AND SUBACROMIAL DECOMPRESSION Right 01/29/2015   Procedure: RIGHT SHOULDER ARTHROSCOPY WITH DEBRIDEMENT ROTATOR CUFF TEAR, SUBACROMIAL DECOMPRESSION, DISTAL  CLAVICAL RESECTION;  Surgeon: Jones BroomJustin Chandler, MD;  Location: [facility];  Service: Orthopedics;  Laterality: Right;  Right shoulder arthroscopy debridement rotatoro cuff tear, subacromial decompression, distal clavical resection  . TONSILLECTOMY    . TOTAL KNEE ARTHROPLASTY Right 2005   right  . TOTAL KNEE REVISION Right 2007   right     Review of Systems     Objective:   Physical Exam   BP 122/64   Pulse 60   Temp 97.7 F (36.5 C)   Resp 16   Ht 5\' 2"  (1.575 m)   Wt 148 lb 6.4 oz (67.3 kg)   BMI 27.14 kg/m   In no Distress. Noted small ecchymosis over hematoma of the Lt forehead and ecchymoses over the Lt zygoma with sl tender STS.  HEENT - WNL. Neck - supple.  Chest - Clear equal BS. Symmetric expansion. Tenderness over the Left anterior chest wall w/o ecchymoses noted. BS  Cor - Nl HS. RRR w/o sig MGR. No edema. MS- FROM w/o deformities.  Gait Nl. Neuro -  Nl w/o focal abnormalities. Cr N 2-12 - WNL.          Assessment & Plan:   1. Contusion of forehead    2. Contusion, chest wall ] - predniSONE 20 MG ; 1 tab 3 x day for 3 days, then 1 tab 2 x day for 3 days, then 1 tab 1 x day for 5 days  Disp: # 20

## 2017-05-06 ENCOUNTER — Ambulatory Visit: Payer: Medicare Other

## 2017-06-08 ENCOUNTER — Ambulatory Visit (INDEPENDENT_AMBULATORY_CARE_PROVIDER_SITE_OTHER): Payer: Medicare Other | Admitting: Internal Medicine

## 2017-06-08 ENCOUNTER — Encounter: Payer: Self-pay | Admitting: Internal Medicine

## 2017-06-08 VITALS — BP 134/70 | HR 74 | Temp 97.9°F | Resp 16 | Ht 63.0 in | Wt 150.4 lb

## 2017-06-08 DIAGNOSIS — Z136 Encounter for screening for cardiovascular disorders: Secondary | ICD-10-CM

## 2017-06-08 DIAGNOSIS — E1122 Type 2 diabetes mellitus with diabetic chronic kidney disease: Secondary | ICD-10-CM

## 2017-06-08 DIAGNOSIS — Z1212 Encounter for screening for malignant neoplasm of rectum: Secondary | ICD-10-CM

## 2017-06-08 DIAGNOSIS — E039 Hypothyroidism, unspecified: Secondary | ICD-10-CM

## 2017-06-08 DIAGNOSIS — Z79899 Other long term (current) drug therapy: Secondary | ICD-10-CM | POA: Diagnosis not present

## 2017-06-08 DIAGNOSIS — Z1211 Encounter for screening for malignant neoplasm of colon: Secondary | ICD-10-CM | POA: Diagnosis not present

## 2017-06-08 DIAGNOSIS — E782 Mixed hyperlipidemia: Secondary | ICD-10-CM | POA: Diagnosis not present

## 2017-06-08 DIAGNOSIS — G250 Essential tremor: Secondary | ICD-10-CM | POA: Diagnosis not present

## 2017-06-08 DIAGNOSIS — I10 Essential (primary) hypertension: Secondary | ICD-10-CM

## 2017-06-08 DIAGNOSIS — K219 Gastro-esophageal reflux disease without esophagitis: Secondary | ICD-10-CM

## 2017-06-08 DIAGNOSIS — N183 Chronic kidney disease, stage 3 unspecified: Secondary | ICD-10-CM

## 2017-06-08 DIAGNOSIS — E559 Vitamin D deficiency, unspecified: Secondary | ICD-10-CM

## 2017-06-08 LAB — CBC WITH DIFFERENTIAL/PLATELET
BASOS PCT: 1 %
Basophils Absolute: 69 cells/uL (ref 0–200)
EOS ABS: 207 {cells}/uL (ref 15–500)
Eosinophils Relative: 3 %
HEMATOCRIT: 34.3 % — AB (ref 35.0–45.0)
HEMOGLOBIN: 11.1 g/dL — AB (ref 11.7–15.5)
LYMPHS ABS: 2001 {cells}/uL (ref 850–3900)
LYMPHS PCT: 29 %
MCH: 30.7 pg (ref 27.0–33.0)
MCHC: 32.4 g/dL (ref 32.0–36.0)
MCV: 94.8 fL (ref 80.0–100.0)
MONO ABS: 483 {cells}/uL (ref 200–950)
MPV: 9.3 fL (ref 7.5–12.5)
Monocytes Relative: 7 %
NEUTROS PCT: 60 %
Neutro Abs: 4140 cells/uL (ref 1500–7800)
Platelets: 363 10*3/uL (ref 140–400)
RBC: 3.62 MIL/uL — AB (ref 3.80–5.10)
RDW: 13.7 % (ref 11.0–15.0)
WBC: 6.9 10*3/uL (ref 3.8–10.8)

## 2017-06-08 NOTE — Patient Instructions (Signed)

## 2017-06-08 NOTE — Progress Notes (Signed)
ADULT & ADOLESCENT INTERNAL MEDICINE Lucky CowboyWilliam Antone Summons, [name]      Dyanne CarrelAmanda R. Steffanie Dunnollier, [name] [facility]                [address]                [place], [place] [id number] Telephone [phone] Telefax [phone]  Annual Screening/Preventative Visit & Comprehensive Evaluation &  Examination     This very nice 83 y.o. [facility] presents for a Screening/Preventative Visit & comprehensive evaluation and management of multiple medical co-morbidities.  Patient has been followed for HTN, Prediabetes, Hyperlipidemia and Vitamin D Deficiency. Her GERD is controlled w/diet & meds.      HTN predates since 11987. Patient's BP has been controlled at home and patient denies any cardiac symptoms as chest pain, palpitations, shortness of breath, dizziness or ankle swelling. Today's BP is at goal - 134/70.      Patient's hyperlipidemia is controlled with diet and medications. Patient denies myalgias or other medication SE's. Last lipids were  Lab Results  Component Value Date   CHOL 134 03/02/2017   HDL 59 03/02/2017   LDLCALC 55 03/02/2017   TRIG 102 03/02/2017   CHOLHDL 2.3 03/02/2017      Patient has T2_NIDDM (A1c 6.5% in 2008,  more recent A1c 7.1% in Aug 2016  and A1c 6.7% in Mar 2017) and also has CKD3  and patient denies reactive hypoglycemic symptoms, visual blurring, diabetic polys, or paresthesias. Last A1c was not at goal: Lab Results  Component Value Date   HGBA1C 6.3 (H) 03/02/2017      Patient has been on Thyroid replacement since 2012.  Finally, patient has history of Vitamin D Deficiency ("24" in 2009)  and last Vitamin D was at goal: Lab Results  Component Value Date   VD25OH 64 12/01/2016   Current Outpatient Prescriptions on File Prior to Visit  Medication Sig  . aspirin 81 MG tablet Take 81 mg by mouth daily.  [name] Kitchen. atenolol (TENORMIN) 100 MG tablet take 1 tablet by mouth once daily  . atorvastatin (LIPITOR) 80 MG tablet take 1 tablet by  mouth once daily  . Biotin 10 MG CAPS Take 1 tablet by mouth daily.   . cholecalciferol (VITAMIN D) 1000 UNITS tablet Take 1,000 Units by mouth daily.  . citalopram (CELEXA) 40 MG tablet take 1 tablet by mouth once daily  . cloNIDine (CATAPRES) 0.1 MG tablet Take 1 tablet (0.1 mg total) by mouth 2 (two) times daily.  [name] Kitchen. docusate sodium (COLACE) 100 MG capsule Take 1 capsule (100 mg total) by mouth 3 (three) times daily as needed.  . furosemide (LASIX) 40 MG tablet take 1 tablet by mouth once daily  . gabapentin (NEURONTIN) 100 MG capsule take 1 capsule by mouth WITH BREAKFAST AND LUNCH TO HELP WITH LEG PAIN  . glucose blood test strip Check blood sugar 1 time daily-DX-E11.22.  . lansoprazole (PREVACID) 15 MG capsule Take 15 mg by mouth daily at 12 noon.  [name] Kitchen. losartan (COZAAR) 100 MG tablet take 1 tablet by mouth once daily  . Magnesium 400 MG CAPS Take 1 capsule by mouth daily.   [name] Kitchen. mexiletine (MEXITIL) 150 MG capsule take 1 capsule by mouth three times a day  . minoxidil (LONITEN) 10 MG tablet Take 1/2 to 1 tablet daily for BP  . Multiple Vitamins-Minerals (MULTIVITAMIN WITH MINERALS) tablet Take 1 tablet by mouth daily.  . predniSONE (DELTASONE) 20 MG tablet 1 tab 3  x day for 3 days, then 1 tab 2 x day for 3 days, then 1 tab 1 x day for 5 days  . PRODIGY LANCETS 28G MISC Check blood sugar 1 time daily-DX-E11.22.  [name] Kitchen SYNTHROID 100 MCG tablet take 1 tablet by mouth once daily  . traZODone (DESYREL) 100 MG tablet take 3 tablets by mouth at bedtime  . verapamil (VERELAN PM) 120 MG 24 hr capsule take 1 capsule by mouth twice a day with food   No current facility-administered medications on file prior to visit.    Allergies  Allergen Reactions  . Codeine Rash    rash  . Vasotec [Enalapril] Other (See Comments)    cough  . Augmentin [Amoxicillin-Pot Clavulanate] Other (See Comments)    vaginitis  . Hytrin [Terazosin] Other (See Comments)    incontinence   . Oxycodone Rash    rash  .  Procardia [Nifedipine] Other (See Comments)    edema   Past Medical History:  Diagnosis Date  . Anemia   . Anxiety   . Arthritis   . Constipation   . Depression   . GERD (gastroesophageal reflux disease)   . History of blood transfusion   . History of toe surgery left  . Hyperlipidemia   . Hypertension   . Hypothyroidism   . Insomnia   . Thyroid disease   . Type II or unspecified type diabetes mellitus with renal manifestations, not stated as uncontrolled(250.40)    stage II, does not take medication  . Wears glasses    Health Maintenance  Topic Date Due  . INFLUENZA VACCINE  06/10/2017  . HEMOGLOBIN A1C  09/01/2017  . OPHTHALMOLOGY EXAM  02/18/2018  . FOOT EXAM  03/02/2018  . TETANUS/TDAP  08/24/2024  . DEXA SCAN  Completed  . PNA vac Low Risk Adult  Completed   Immunization History  Administered Date(s) Administered  . Influenza, High Dose Seasonal PF 09/05/2014, 10/02/2015, 07/24/2016  . Influenza-Unspecified 09/10/2013  . Pneumococcal Conjugate-13 07/24/2016  . Pneumococcal-Unspecified 11/26/2012  . Td 11/26/2012  . Tdap 08/24/2014   Past Surgical History:  Procedure Laterality Date  . ABDOMINAL HYSTERECTOMY    . APPENDECTOMY    . BACK SURGERY  02/06/2016  . BREAST BIOPSY     core biopsy 1995  . CARPAL TUNNEL RELEASE Left 07/08/2016   Procedure: CARPAL TUNNEL RELEASE;  Surgeon: Jones Broom, MD;  Location: [facility];  Service: Orthopedics;  Laterality: Left;  Left carpal tunnel release  . CEREBRAL ANEURYSM REPAIR  09/2007   coil ablation  . CHOLECYSTECTOMY  2012   lap choli  . COLONOSCOPY    . GANGLION CYST EXCISION Left 2008   lt ring finger  . HAMMER TOE SURGERY  2008   osteotomy=left foot  . JOINT REPLACEMENT Left 2012   left total knee  . SHOULDER ARTHROSCOPY WITH ROTATOR CUFF REPAIR AND SUBACROMIAL DECOMPRESSION Right 01/29/2015   Procedure: RIGHT SHOULDER ARTHROSCOPY WITH DEBRIDEMENT ROTATOR CUFF TEAR, SUBACROMIAL DECOMPRESSION,  DISTAL CLAVICAL RESECTION;  Surgeon: Jones Broom, MD;  Location: [facility];  Service: Orthopedics;  Laterality: Right;  Right shoulder arthroscopy debridement rotatoro cuff tear, subacromial decompression, distal clavical resection  . TONSILLECTOMY    . TOTAL KNEE ARTHROPLASTY Right 2005   right  . TOTAL KNEE REVISION Right 2007   right   Family History  Problem Relation Age of Onset  . Cancer Mother        breast cancer  . Hypertension Mother   .  Mental illness Mother        alz  . Cancer Father        colon cancer  . Hypertension Father   . Cancer Maternal Aunt        breast cancer with mets   Social History  Substance Use Topics  . Smoking status: Never Smoker  . Smokeless tobacco: Never Used  . Alcohol use Yes     Comment: rare    ROS Constitutional: Denies fever, chills, weight loss/gain, headaches, insomnia,  night sweats, and change in appetite. Does c/o fatigue. Eyes: Denies redness, blurred vision, diplopia, discharge, itchy, watery eyes.  ENT: Denies discharge, congestion, post nasal drip, epistaxis, sore throat, earache, hearing loss, dental pain, Tinnitus, Vertigo, Sinus pain, snoring.  Cardio: Denies chest pain, palpitations, irregular heartbeat, syncope, dyspnea, diaphoresis, orthopnea, PND, claudication, edema Respiratory: denies cough, dyspnea, DOE, pleurisy, hoarseness, laryngitis, wheezing.  Gastrointestinal: Denies dysphagia, heartburn, reflux, water brash, pain, cramps, nausea, vomiting, bloating, diarrhea, constipation, hematemesis, melena, hematochezia, jaundice, hemorrhoids Genitourinary: Denies dysuria, frequency, urgency, nocturia, hesitancy, discharge, hematuria, flank pain Breast: Breast lumps, nipple discharge, bleeding.  Musculoskeletal: Denies arthralgia, myalgia, stiffness, Jt. Swelling, pain, limp, and strain/sprain. Denies falls. Skin: Denies puritis, rash, hives, warts, acne, eczema, changing in skin lesion Neuro: No  weakness, tremor, incoordination, spasms, paresthesia, pain Psychiatric: Denies confusion, memory loss, sensory loss. Denies Depression. Endocrine: Denies change in weight, skin, hair change, nocturia, and paresthesia, diabetic polys, visual blurring, hyper / hypo glycemic episodes.  Heme/Lymph: No excessive bleeding, bruising, enlarged lymph nodes.  Physical Exam  BP 134/70   Pulse 74   Temp 97.9 F (36.6 C)   Resp 16   Ht 5\' 3"  (1.6 m)   Wt 150 lb 6.4 oz (68.2 kg)   BMI 26.64 kg/m   General Appearance: Well nourished, well groomed and in no apparent distress.  Eyes: PERRLA, EOMs, conjunctiva no swelling or erythema, normal fundi and vessels. Sinuses: No frontal/maxillary tenderness ENT/Mouth: EACs patent / TMs  nl. Nares clear without erythema, swelling, mucoid exudates. Oral hygiene is good. No erythema, swelling, or exudate. Tongue normal, non-obstructing. Tonsils not swollen or erythematous. Hearing normal.  Neck: Supple, thyroid normal. No bruits, nodes or JVD. Respiratory: Respiratory effort normal.  BS equal and clear bilateral without rales, rhonci, wheezing or stridor. Cardio: Heart sounds are normal with regular rate and rhythm and no murmurs, rubs or gallops. Peripheral pulses are normal and equal bilaterally without edema. No aortic or femoral bruits. Chest: symmetric with normal excursions and percussion. Breasts: Symmetric, without lumps, nipple discharge, retractions, or fibrocystic changes.  Abdomen: Flat, soft with bowel sounds active. Nontender, no guarding, rebound, hernias, masses, or organomegaly.  Lymphatics: Non tender without lymphadenopathy.  Genitourinary:  Musculoskeletal: Full ROM all peripheral extremities, joint stability, 5/5 strength, and normal gait. Skin: Warm and dry without rashes, lesions, cyanosis, clubbing or  ecchymosis.  Neuro: Cranial nerves intact, reflexes equal bilaterally. Normal muscle tone, no cerebellar symptoms. Sensation intact to  touch, vibratory and Monofilament to the toes bilaterally. Pysch: Alert and oriented X 3, normal affect, Insight and Judgment appropriate.   Assessment and Plan 1. Essential hypertension  - EKG 12-Lead - Urinalysis, Routine w reflex microscopic - Microalbumin / creatinine urine ratio - CBC with Differential/Platelet - BASIC METABOLIC PANEL WITH GFR - Magnesium - TSH  2. Hyperlipidemia, mixed  - EKG 12-Lead - Hepatic function panel - Lipid panel - TSH  3. Type 2 diabetes mellitus with stage 3 chronic kidney disease, without long-term current use  of insulin (HCC)  - EKG 12-Lead - HM DIABETES FOOT EXAM - LOW EXTREMITY NEUR EXAM DOCUM - Hemoglobin A1c - Insulin, random  4. Vitamin D deficiency  - VITAMIN D 25 Hydroxy  5. Hypothyroidism  - TSH  6. Gastroesophageal reflux disease   7. Essential tremor   8. Screening for rectal cancer  - POC Hemoccult Bld/Stl   9. Screening for ischemic heart disease  - EKG 12-Lead  10. Medication management  - Urinalysis, Routine w reflex microscopic - Microalbumin / creatinine urine ratio - CBC with Differential/Platelet - BASIC METABOLIC PANEL WITH GFR - Hepatic function panel - Magnesium - Lipid panel - TSH - Hemoglobin A1c - Insulin, random - VITAMIN D 25 Hydroxy      Patient was counseled in prudent diet to achieve/maintain BMI less than 25 for weight control, BP monitoring, regular exercise and medications. Discussed med's effects and SE's. Screening labs and tests as requested with regular follow-up as recommended. Over 40 minutes of exam, counseling, chart review and high complex critical decision making was performed.

## 2017-06-09 LAB — MICROALBUMIN / CREATININE URINE RATIO

## 2017-06-09 LAB — URINALYSIS, ROUTINE W REFLEX MICROSCOPIC

## 2017-06-09 LAB — BASIC METABOLIC PANEL WITH GFR
BUN: 24 mg/dL (ref 7–25)
CO2: 28 mmol/L (ref 20–31)
Calcium: 9.3 mg/dL (ref 8.6–10.4)
Chloride: 100 mmol/L (ref 98–110)
Creat: 1.38 mg/dL — ABNORMAL HIGH (ref 0.60–0.88)
GFR, EST NON AFRICAN AMERICAN: 36 mL/min — AB (ref >=60)
GFR, Est African American: 41 mL/min — ABNORMAL LOW (ref >=60)
GLUCOSE: 115 mg/dL — AB (ref 65–99)
POTASSIUM: 3.7 mmol/L (ref 3.5–5.3)
Sodium: 142 mmol/L (ref 135–146)

## 2017-06-09 LAB — HEMOGLOBIN A1C
HEMOGLOBIN A1C: 6.7 % — AB (ref <5.7)
MEAN PLASMA GLUCOSE: 146 mg/dL

## 2017-06-09 LAB — HEPATIC FUNCTION PANEL
ALBUMIN: 4.3 g/dL (ref 3.6–5.1)
ALK PHOS: 76 U/L (ref 33–130)
ALT: 12 U/L (ref 6–29)
AST: 22 U/L (ref 10–35)
BILIRUBIN INDIRECT: 0.2 mg/dL (ref 0.2–1.2)
Bilirubin, Direct: 0.1 mg/dL (ref <=0.2)
TOTAL PROTEIN: 6.4 g/dL (ref 6.1–8.1)
Total Bilirubin: 0.3 mg/dL (ref 0.2–1.2)

## 2017-06-09 LAB — MAGNESIUM: Magnesium: 2 mg/dL (ref 1.5–2.5)

## 2017-06-09 LAB — LIPID PANEL
Cholesterol: 149 mg/dL (ref <200)
HDL: 55 mg/dL (ref >50)
LDL CALC: 53 mg/dL (ref <100)
Total CHOL/HDL Ratio: 2.7 Ratio (ref <5.0)
Triglycerides: 207 mg/dL — ABNORMAL HIGH (ref <150)
VLDL: 41 mg/dL — AB (ref <30)

## 2017-06-09 LAB — TSH: TSH: 1.01 m[IU]/L

## 2017-06-09 LAB — INSULIN, RANDOM: Insulin: 14 u[IU]/mL (ref 2.0–19.6)

## 2017-06-09 LAB — VITAMIN D 25 HYDROXY (VIT D DEFICIENCY, FRACTURES): Vit D, 25-Hydroxy: 75 ng/mL (ref 30–100)

## 2017-06-17 ENCOUNTER — Other Ambulatory Visit: Payer: Self-pay | Admitting: Internal Medicine

## 2017-07-01 ENCOUNTER — Other Ambulatory Visit: Payer: Self-pay

## 2017-07-01 DIAGNOSIS — Z1212 Encounter for screening for malignant neoplasm of rectum: Secondary | ICD-10-CM

## 2017-07-01 LAB — POC HEMOCCULT BLD/STL (HOME/3-CARD/SCREEN)
Card #2 Fecal Occult Blod, POC: NEGATIVE
Card #3 Fecal Occult Blood, POC: NEGATIVE
FECAL OCCULT BLD: NEGATIVE

## 2017-07-20 ENCOUNTER — Other Ambulatory Visit: Payer: Self-pay | Admitting: Internal Medicine

## 2017-07-21 ENCOUNTER — Other Ambulatory Visit: Payer: Self-pay | Admitting: Internal Medicine

## 2017-07-21 MED ORDER — PREDNISONE 20 MG PO TABS: ORAL_TABLET | ORAL | 0 refills | Status: DC

## 2017-08-03 ENCOUNTER — Other Ambulatory Visit: Payer: Self-pay | Admitting: Internal Medicine

## 2017-09-14 DIAGNOSIS — H43813 Vitreous degeneration, bilateral: Secondary | ICD-10-CM | POA: Diagnosis not present

## 2017-09-14 DIAGNOSIS — Z961 Presence of intraocular lens: Secondary | ICD-10-CM | POA: Diagnosis not present

## 2017-09-14 DIAGNOSIS — H04123 Dry eye syndrome of bilateral lacrimal glands: Secondary | ICD-10-CM | POA: Diagnosis not present

## 2017-09-16 ENCOUNTER — Ambulatory Visit (INDEPENDENT_AMBULATORY_CARE_PROVIDER_SITE_OTHER): Payer: Medicare Other | Admitting: Internal Medicine

## 2017-09-16 ENCOUNTER — Ambulatory Visit: Payer: Self-pay | Admitting: Physician Assistant

## 2017-09-16 ENCOUNTER — Encounter: Payer: Self-pay | Admitting: Internal Medicine

## 2017-09-16 VITALS — BP 128/62 | HR 64 | Temp 97.7°F | Resp 18 | Ht 63.0 in | Wt 148.4 lb

## 2017-09-16 DIAGNOSIS — K219 Gastro-esophageal reflux disease without esophagitis: Secondary | ICD-10-CM | POA: Diagnosis not present

## 2017-09-16 DIAGNOSIS — Z79899 Other long term (current) drug therapy: Secondary | ICD-10-CM | POA: Diagnosis not present

## 2017-09-16 DIAGNOSIS — I10 Essential (primary) hypertension: Secondary | ICD-10-CM | POA: Diagnosis not present

## 2017-09-16 DIAGNOSIS — E559 Vitamin D deficiency, unspecified: Secondary | ICD-10-CM

## 2017-09-16 DIAGNOSIS — Z23 Encounter for immunization: Secondary | ICD-10-CM

## 2017-09-16 DIAGNOSIS — E039 Hypothyroidism, unspecified: Secondary | ICD-10-CM

## 2017-09-16 DIAGNOSIS — E782 Mixed hyperlipidemia: Secondary | ICD-10-CM | POA: Diagnosis not present

## 2017-09-16 DIAGNOSIS — N183 Chronic kidney disease, stage 3 unspecified: Secondary | ICD-10-CM

## 2017-09-16 DIAGNOSIS — E1122 Type 2 diabetes mellitus with diabetic chronic kidney disease: Secondary | ICD-10-CM

## 2017-09-16 NOTE — Patient Instructions (Signed)

## 2017-09-16 NOTE — Progress Notes (Signed)
This very nice 83 y.o. WWF presents for 3 month follow up with Hypertension, Hyperlipidemia, Pre-Diabetes and Vitamin D Deficiency.      Patient is treated for HTN (1987) & BP has been controlled at home. Today's BP at goal -  128/62. Patient has had no complaints of any cardiac type chest pain, palpitations, dyspnea / orthopnea / PND, dizziness, claudication, or dependent edema.     Hyperlipidemia is controlled with diet & meds. Patient denies myalgias or other med SE's. Last Lipids were  Lab Results  Component Value Date   CHOL 149 06/08/2017   HDL 55 06/08/2017   LDLCALC 53 06/08/2017   TRIG 207 (H) 06/08/2017   CHOLHDL 2.7 06/08/2017      Also, the patient has history of T2_NIDDM (A1c 6.5% in 2008) with A1c 7.1% in 2016 and 6.7% in 2017. Patient has Dia=betic CKD 3 has had no symptoms of reactive hypoglycemia, diabetic polys, paresthesias or visual blurring.  Last A1c was  Lab Results  Component Value Date   HGBA1C 6.7 (H) 06/08/2017       Patient has been on Thyroid Replacement since 2012. Further, the patient also has history of Vitamin D Deficiency ("24" / 2009) and supplements vitamin D without any suspected side-effects. Last vitamin D was   Lab Results  Component Value Date   VD25OH 95 06/08/2017   Current Outpatient Medications on File Prior to Visit  Medication Sig  . aspirin 81 MG tablet Take 81 mg by mouth daily.  [name] Kitchen atenolol (TENORMIN) 100 MG tablet Take 1 tablet daily for BP  . atorvastatin (LIPITOR) 80 MG tablet take 1 tablet by mouth once daily  . Biotin 10 MG CAPS Take 1 tablet by mouth daily.   . cholecalciferol (VITAMIN D) 1000 UNITS tablet Take 1,000 Units by mouth daily.  . citalopram (CELEXA) 40 MG tablet take 1 tablet by mouth once daily  . docusate sodium (COLACE) 100 MG capsule Take 1 capsule (100 mg total) by mouth 3 (three) times daily as needed.  . furosemide (LASIX) 40 MG tablet take 1 tablet by mouth once daily  . glucose blood test strip Check  blood sugar 1 time daily-DX-E11.22.  . lansoprazole (PREVACID) 15 MG capsule Take 15 mg by mouth daily at 12 noon.  [name] Kitchen losartan (COZAAR) 100 MG tablet take 1 tablet by mouth once daily  . Magnesium 400 MG CAPS Take 1 capsule by mouth daily.   [name] Kitchen mexiletine (MEXITIL) 150 MG capsule take 1 capsule by mouth three times a day  . Multiple Vitamins-Minerals (MULTIVITAMIN WITH MINERALS) tablet Take 1 tablet by mouth daily.  [name] Kitchen PRODIGY LANCETS 28G MISC Check blood sugar 1 time daily-DX-E11.22.  [name] Kitchen SYNTHROID 100 MCG tablet take 1 tablet by mouth once daily  . traZODone (DESYREL) 300 MG tablet Take 1 tablet at hour of sleep  . verapamil (VERELAN PM) 120 MG 24 hr capsule take 1 capsule by mouth twice a day with food  . minoxidil (LONITEN) 10 MG tablet Take 1/2 to 1 tablet daily for BP   No current facility-administered medications on file prior to visit.    Allergies  Allergen Reactions  . Codeine Rash    rash  . Vasotec [Enalapril] Other (See Comments)    cough  . Augmentin [Amoxicillin-Pot Clavulanate] Other (See Comments)    vaginitis  . Hytrin [Terazosin] Other (See Comments)    incontinence   . Oxycodone Rash    rash  . Procardia [Nifedipine] Other (  See Comments)    edema   PMHx:   Past Medical History:  Diagnosis Date  . Anemia   . Anxiety   . Arthritis   . Constipation   . Depression   . GERD (gastroesophageal reflux disease)   . History of blood transfusion   . History of toe surgery left  . Hyperlipidemia   . Hypertension   . Hypothyroidism   . Insomnia   . Thyroid disease   . Type II or unspecified type diabetes mellitus with renal manifestations, not stated as uncontrolled(250.40)    stage II, does not take medication  . Wears glasses    Immunization History  Administered Date(s) Administered  . Influenza, High Dose Seasonal PF 09/05/2014, 10/02/2015, 07/24/2016  . Influenza-Unspecified 09/10/2013  . Pneumococcal Conjugate-13 07/24/2016  . Pneumococcal-Unspecified  11/26/2012  . Td 11/26/2012  . Tdap 08/24/2014   Past Surgical History:  Procedure Laterality Date  . ABDOMINAL HYSTERECTOMY    . APPENDECTOMY    . BACK SURGERY  02/06/2016  . BREAST BIOPSY     core biopsy 1995  . CEREBRAL ANEURYSM REPAIR  09/2007   coil ablation  . CHOLECYSTECTOMY  2012   lap choli  . COLONOSCOPY    . GANGLION CYST EXCISION Left 2008   lt ring finger  . HAMMER TOE SURGERY  2008   osteotomy=left foot  . JOINT REPLACEMENT Left 2012   left total knee  . TONSILLECTOMY    . TOTAL KNEE ARTHROPLASTY Right 2005   right  . TOTAL KNEE REVISION Right 2007   right   FHx:    Reviewed / unchanged  SHx:    Reviewed / unchanged  Systems Review:  Constitutional: Denies fever, chills, wt changes, headaches, insomnia, fatigue, night sweats, change in appetite. Eyes: Denies redness, blurred vision, diplopia, discharge, itchy, watery eyes.  ENT: Denies discharge, congestion, post nasal drip, epistaxis, sore throat, earache, hearing loss, dental pain, tinnitus, vertigo, sinus pain, snoring.  CV: Denies chest pain, palpitations, irregular heartbeat, syncope, dyspnea, diaphoresis, orthopnea, PND, claudication or edema. Respiratory: denies cough, dyspnea, DOE, pleurisy, hoarseness, laryngitis, wheezing.  Gastrointestinal: Denies dysphagia, odynophagia, heartburn, reflux, water brash, abdominal pain or cramps, nausea, vomiting, bloating, diarrhea, constipation, hematemesis, melena, hematochezia  or hemorrhoids. Genitourinary: Denies dysuria, frequency, urgency, nocturia, hesitancy, discharge, hematuria or flank pain. Musculoskeletal: Denies arthralgias, myalgias, stiffness, jt. swelling, pain, limping or strain/sprain.  Skin: Denies pruritus, rash, hives, warts, acne, eczema or change in skin lesion(s). Neuro: No weakness, tremor, incoordination, spasms, paresthesia or pain. Psychiatric: Denies confusion, memory loss or sensory loss. Endo: Denies change in weight, skin or hair  change.  Heme/Lymph: No excessive bleeding, bruising or enlarged lymph nodes.  Physical Exam  BP 128/62   Pulse 64   Temp 97.7 F (36.5 C)   Resp 18   Ht 5\' 3"  (1.6 m)   Wt 148 lb 6.4 oz (67.3 kg)   BMI 26.29 kg/m   Appears well nourished, well groomed  and in no distress.  Eyes: PERRLA, EOMs, conjunctiva no swelling or erythema. Sinuses: No frontal/maxillary tenderness ENT/Mouth: EAC's clear, TM's nl w/o erythema, bulging. Nares clear w/o erythema, swelling, exudates. Oropharynx clear without erythema or exudates. Oral hygiene is good. Tongue normal, non obstructing. Hearing intact.  Neck: Supple. Thyroid nl. Car 2+/2+ without bruits, nodes or JVD. Chest: Respirations nl with BS clear & equal w/o rales, rhonchi, wheezing or stridor.  Cor: Heart sounds normal w/ regular rate and rhythm without sig. murmurs, gallops, clicks or rubs. Peripheral  pulses normal and equal  without edema.  Abdomen: Soft & bowel sounds normal. Non-tender w/o guarding, rebound, hernias, masses or organomegaly.  Lymphatics: Unremarkable.  Musculoskeletal: Full ROM all peripheral extremities, joint stability, 5/5 strength and normal gait.  Skin: Warm, dry without exposed rashes, lesions or ecchymosis apparent.  Neuro: Cranial nerves intact, reflexes equal bilaterally. Sensory-motor testing grossly intact. Tendon reflexes grossly intact.  Pysch: Alert & oriented x 3.  Insight and judgement nl & appropriate. No ideations.  Assessment and Plan:  1. Essential hypertension  - Continue medication, monitor blood pressure at home.  - Continue DASH diet. Reminder to go to the ER if any CP,  SOB, nausea, dizziness, severe HA, changes vision/speech.  - CBC with Differential/Platelet - BASIC METABOLIC PANEL WITH GFR - Magnesium - TSH  2. Hyperlipidemia, mixed  - Continue diet/meds, exercise,& lifestyle modifications.  - Continue monitor periodic cholesterol/liver & renal functions   - Hepatic function  panel - Lipid panel - TSH  3. Type 2 diabetes mellitus with stage 3 chronic kidney disease, without long-term current use of insulin (HCC)  - Continue diet, exercise, lifestyle modifications.  - Monitor appropriate labs.  - Hemoglobin A1c - Insulin, random  4. Vitamin D deficiency  - Continue supplementation.  - VITAMIN D 25 Hydroxy   5. Hypothyroidism  - TSH  6. Gastroesophageal reflux disease  - CBC with Differential/Platelet  7. Medication management  - CBC with Differential/Platelet - BASIC METABOLIC PANEL WITH GFR - Hepatic function panel - Magnesium - Lipid panel - TSH - Hemoglobin A1c - Insulin, random - VITAMIN D 25 Hydroxy  8. Need for immunization against influenza   - Flu vaccine HIGH DOSE PF (Fluzone High dose)       Discussed  regular exercise, BP monitoring, weight control to achieve/maintain BMI less than 25 and discussed med and SE's. Recommended labs to assess and monitor clinical status with further disposition pending results of labs. Over 30 minutes of exam, counseling, chart review was performed.

## 2017-09-17 LAB — CBC WITH DIFFERENTIAL/PLATELET
BASOS ABS: 92 {cells}/uL (ref 0–200)
Basophils Relative: 1.3 %
EOS PCT: 3 %
Eosinophils Absolute: 213 cells/uL (ref 15–500)
HCT: 33.8 % — ABNORMAL LOW (ref 35.0–45.0)
Hemoglobin: 11 g/dL — ABNORMAL LOW (ref 11.7–15.5)
Lymphs Abs: 2272 cells/uL (ref 850–3900)
MCH: 30.2 pg (ref 27.0–33.0)
MCHC: 32.5 g/dL (ref 32.0–36.0)
MCV: 92.9 fL (ref 80.0–100.0)
MONOS PCT: 8.8 %
MPV: 10.5 fL (ref 7.5–12.5)
NEUTROS PCT: 54.9 %
Neutro Abs: 3898 cells/uL (ref 1500–7800)
PLATELETS: 254 10*3/uL (ref 140–400)
RBC: 3.64 10*6/uL — ABNORMAL LOW (ref 3.80–5.10)
RDW: 11.7 % (ref 11.0–15.0)
Total Lymphocyte: 32 %
WBC mixed population: 625 cells/uL (ref 200–950)
WBC: 7.1 10*3/uL (ref 3.8–10.8)

## 2017-09-17 LAB — HEPATIC FUNCTION PANEL
AG RATIO: 1.8 (calc) (ref 1.0–2.5)
ALKALINE PHOSPHATASE (APISO): 58 U/L (ref 33–130)
ALT: 10 U/L (ref 6–29)
AST: 19 U/L (ref 10–35)
Albumin: 4.2 g/dL (ref 3.6–5.1)
BILIRUBIN DIRECT: 0.1 mg/dL (ref 0.0–0.2)
BILIRUBIN TOTAL: 0.3 mg/dL (ref 0.2–1.2)
Globulin: 2.4 g/dL (calc) (ref 1.9–3.7)
Indirect Bilirubin: 0.2 mg/dL (calc) (ref 0.2–1.2)
Total Protein: 6.6 g/dL (ref 6.1–8.1)

## 2017-09-17 LAB — LIPID PANEL
CHOL/HDL RATIO: 2.6 (calc) (ref <5.0)
Cholesterol: 137 mg/dL (ref <200)
HDL: 53 mg/dL (ref >50)
LDL Cholesterol (Calc): 59 mg/dL (calc)
NON-HDL CHOLESTEROL (CALC): 84 mg/dL (ref <130)
Triglycerides: 170 mg/dL — ABNORMAL HIGH (ref <150)

## 2017-09-17 LAB — HEMOGLOBIN A1C
HEMOGLOBIN A1C: 6.4 %{Hb} — AB (ref <5.7)
Mean Plasma Glucose: 137 (calc)
eAG (mmol/L): 7.6 (calc)

## 2017-09-17 LAB — BASIC METABOLIC PANEL WITH GFR
BUN / CREAT RATIO: 16 (calc) (ref 6–22)
BUN: 21 mg/dL (ref 7–25)
CO2: 33 mmol/L — ABNORMAL HIGH (ref 20–32)
CREATININE: 1.29 mg/dL — AB (ref 0.60–0.88)
Calcium: 9.7 mg/dL (ref 8.6–10.4)
Chloride: 101 mmol/L (ref 98–110)
GFR, EST AFRICAN AMERICAN: 45 mL/min/{1.73_m2} — AB (ref >=60)
GFR, EST NON AFRICAN AMERICAN: 39 mL/min/{1.73_m2} — AB (ref >=60)
Glucose, Bld: 108 mg/dL — ABNORMAL HIGH (ref 65–99)
POTASSIUM: 3.8 mmol/L (ref 3.5–5.3)
SODIUM: 141 mmol/L (ref 135–146)

## 2017-09-17 LAB — MAGNESIUM: Magnesium: 2 mg/dL (ref 1.5–2.5)

## 2017-09-17 LAB — TSH: TSH: 1.72 mIU/L (ref 0.40–4.50)

## 2017-09-17 LAB — VITAMIN D 25 HYDROXY (VIT D DEFICIENCY, FRACTURES): VIT D 25 HYDROXY: 77 ng/mL (ref 30–100)

## 2017-09-17 LAB — INSULIN, RANDOM: INSULIN: 6.2 u[IU]/mL (ref 2.0–19.6)

## 2017-10-13 ENCOUNTER — Other Ambulatory Visit: Payer: Self-pay | Admitting: Internal Medicine

## 2017-11-02 ENCOUNTER — Other Ambulatory Visit: Payer: Self-pay | Admitting: Internal Medicine

## 2017-11-02 MED ORDER — CITALOPRAM HYDROBROMIDE 20 MG PO TABS: ORAL_TABLET | ORAL | 1 refills | Status: DC

## 2017-11-03 ENCOUNTER — Other Ambulatory Visit: Payer: Self-pay | Admitting: Internal Medicine

## 2017-12-17 ENCOUNTER — Ambulatory Visit: Payer: Self-pay | Admitting: Internal Medicine

## 2017-12-18 ENCOUNTER — Other Ambulatory Visit: Payer: Self-pay | Admitting: Internal Medicine

## 2017-12-22 ENCOUNTER — Other Ambulatory Visit: Payer: Self-pay | Admitting: Internal Medicine

## 2017-12-24 ENCOUNTER — Ambulatory Visit (INDEPENDENT_AMBULATORY_CARE_PROVIDER_SITE_OTHER): Payer: Medicare Other | Admitting: Internal Medicine

## 2017-12-24 ENCOUNTER — Encounter: Payer: Self-pay | Admitting: Internal Medicine

## 2017-12-24 VITALS — BP 128/62 | HR 72 | Temp 97.3°F | Resp 16 | Ht 63.0 in | Wt 144.0 lb

## 2017-12-24 DIAGNOSIS — E1122 Type 2 diabetes mellitus with diabetic chronic kidney disease: Secondary | ICD-10-CM

## 2017-12-24 DIAGNOSIS — E039 Hypothyroidism, unspecified: Secondary | ICD-10-CM | POA: Diagnosis not present

## 2017-12-24 DIAGNOSIS — E559 Vitamin D deficiency, unspecified: Secondary | ICD-10-CM

## 2017-12-24 DIAGNOSIS — I10 Essential (primary) hypertension: Secondary | ICD-10-CM | POA: Diagnosis not present

## 2017-12-24 DIAGNOSIS — N183 Chronic kidney disease, stage 3 unspecified: Secondary | ICD-10-CM

## 2017-12-24 DIAGNOSIS — K219 Gastro-esophageal reflux disease without esophagitis: Secondary | ICD-10-CM

## 2017-12-24 DIAGNOSIS — Z79899 Other long term (current) drug therapy: Secondary | ICD-10-CM

## 2017-12-24 DIAGNOSIS — E782 Mixed hyperlipidemia: Secondary | ICD-10-CM | POA: Diagnosis not present

## 2017-12-24 NOTE — Patient Instructions (Signed)

## 2017-12-24 NOTE — Progress Notes (Signed)
This very nice 84 y.o. WWF presents for 3 month follow up with Hypertension, Hyperlipidemia, Pre-Diabetes and Vitamin D Deficiency.      Patient is treated for HTN (1987) & BP has been controlled at home. Today's BP is at goal: 128/62. Patient has had no complaints of any cardiac type chest pain, palpitations, dyspnea / orthopnea / PND, dizziness, claudication, or dependent edema.     Hyperlipidemia is controlled with diet & meds. Patient denies myalgias or other med SE's. Last Lipids were at goal albeit  elevated Trig's: Lab Results  Component Value Date   CHOL 137 09/16/2017   HDL 53 09/16/2017   LDLCALC 53 06/08/2017   TRIG 170 (H) 09/16/2017   CHOLHDL 2.6 09/16/2017      Also, the patient has history of T2_NIDDM (A1c 6.5% / 2008,  A1c 7.1% / 2016 and 6.7% / 2017) and she has had no symptoms of reactive hypoglycemia, diabetic polys, paresthesias or visual blurring.  She does not monitor CBG's.Last A1c was not at goal: Lab Results  Component Value Date   HGBA1C 6.4 (H) 09/16/2017      Patient was initiated on Thyroid replacement in 2012. Further, the patient also has history of Vitamin D Deficiency ("24"/2009) and supplements vitamin D without any suspected side-effects. Last vitamin D was at goal: Lab Results  Component Value Date   VD25OH 77 09/16/2017   Current Outpatient Medications on File Prior to Visit  Medication Sig  . aspirin 81 MG  tablet Take 81 mg by mouth daily.  [name] Kitchen atenolol (TENORMIN) 100 MG tablet take 1 tablet by mouth once daily for blood pressure  . atorvastatin (LIPITOR) 80 MG tablet take 1 tablet by mouth once daily  . Biotin 10 MG CAPS Take 1 tablet by mouth daily.   . cholecalciferol (VITAMIN D) 1000 UNITS tablet Take 1,000 Units by mouth daily.  . citalopram (CELEXA) 20 MG tablet Take 1 tablet daily for Mood  . docusate sodium (COLACE) 100 MG capsule Take 1 capsule (100 mg total) by mouth 3 (three) times daily as needed.  . furosemide (LASIX) 40 MG tablet take 1 tablet by mouth once daily  . glucose blood test strip Check blood sugar 1 time daily-DX-E11.22.  . lansoprazole (PREVACID) 15 MG capsule Take 15 mg by mouth daily at 12 noon.  [name] Kitchen losartan (COZAAR) 100 MG tablet take 1 tablet by mouth once daily  . Magnesium 400 MG CAPS Take 1 capsule by mouth daily.   [name] Kitchen mexiletine (MEXITIL) 150 MG capsule take 1 capsule by mouth three times a day  . Multiple Vitamins-Minerals (MULTIVITAMIN WITH MINERALS) tablet Take 1 tablet by mouth daily.  [name] Kitchen PRODIGY LANCETS 28G MISC Check blood sugar 1 time daily-DX-E11.22.  [name] Kitchen SYNTHROID 100 MCG tablet take 1 tablet by mouth once daily  . traZODone (DESYREL) 300 MG tablet Take 1 tablet at hour of sleep  . verapamil (VERELAN PM) 120 MG 24 hr capsule take 1 capsule by mouth twice a day with food  . minoxidil (LONITEN) 10 MG tablet Take 1/2 to 1 tablet daily for BP   No current facility-administered medications on file prior to visit.    Allergies  Allergen Reactions  . Codeine Rash    rash  . Vasotec [Enalapril] Other (See Comments)    cough  . Augmentin [Amoxicillin-Pot Clavulanate] Other (See Comments)    vaginitis  . Hytrin [Terazosin] Other (See Comments)    incontinence   . Oxycodone Rash    rash  . Procardia [Nifedipine] Other (See Comments)    edema   PMHx:   Past Medical History:  Diagnosis Date  . Anemia   . Anxiety   . Arthritis   . Constipation   .  Depression   . GERD (gastroesophageal reflux disease)   . History of blood transfusion   . History of toe surgery left  . Hyperlipidemia   . Hypertension   . Hypothyroidism   . Insomnia   . Thyroid disease   . Type II or unspecified type diabetes  mellitus with renal manifestations, not stated as uncontrolled(250.40)    stage II, does not take medication  . Wears glasses    Immunization History  Administered Date(s) Administered  . Influenza, High Dose Seasonal PF 09/05/2014, 10/02/2015, 07/24/2016, 09/16/2017  . Influenza-Unspecified 09/10/2013  . Pneumococcal Conjugate-13 07/24/2016  . Pneumococcal-Unspecified 11/26/2012  . Td 11/26/2012  . Tdap 08/24/2014   Past Surgical History:  Procedure Laterality Date  . ABDOMINAL HYSTERECTOMY    . APPENDECTOMY    . BACK SURGERY  02/06/2016  . BREAST BIOPSY     core biopsy 1995  . CARPAL TUNNEL RELEASE Left 07/08/2016   Procedure: CARPAL TUNNEL RELEASE;  Surgeon: Jones Broom, MD;  Location: [facility];  Service: Orthopedics;  Laterality: Left;  Left carpal tunnel release  . CEREBRAL ANEURYSM REPAIR  09/2007   coil ablation  . CHOLECYSTECTOMY  2012   lap choli  . COLONOSCOPY    . GANGLION CYST EXCISION Left 2008   lt ring finger  . HAMMER TOE SURGERY  2008   osteotomy=left foot  . JOINT REPLACEMENT Left 2012   left total knee  . SHOULDER ARTHROSCOPY WITH ROTATOR CUFF REPAIR AND SUBACROMIAL DECOMPRESSION Right 01/29/2015   Procedure: RIGHT SHOULDER ARTHROSCOPY WITH DEBRIDEMENT ROTATOR CUFF TEAR, SUBACROMIAL DECOMPRESSION, DISTAL CLAVICAL RESECTION;  Surgeon: Jones Broom, MD;  Location: [facility];  Service: Orthopedics;  Laterality: Right;  Right shoulder arthroscopy debridement rotatoro cuff tear, subacromial decompression, distal clavical resection  . TONSILLECTOMY    . TOTAL KNEE ARTHROPLASTY Right 2005   right  . TOTAL KNEE REVISION Right 2007   right   FHx:    Reviewed / unchanged  SHx:     Reviewed / unchanged  Systems Review:  Constitutional: Denies fever, chills, wt changes, headaches, insomnia, fatigue, night sweats, change in appetite. Eyes: Denies redness, blurred vision, diplopia, discharge, itchy, watery eyes.  ENT: Denies discharge, congestion, post nasal drip, epistaxis, sore throat, earache, hearing loss, dental pain, tinnitus, vertigo, sinus pain, snoring.  CV: Denies chest pain, palpitations, irregular heartbeat, syncope, dyspnea, diaphoresis, orthopnea, PND, claudication or edema. Respiratory: denies cough, dyspnea, DOE, pleurisy, hoarseness, laryngitis, wheezing.  Gastrointestinal: Denies dysphagia, odynophagia, heartburn, reflux, water brash, abdominal pain or cramps, nausea, vomiting, bloating, diarrhea, constipation, hematemesis, melena, hematochezia  or hemorrhoids. Genitourinary: Denies dysuria, frequency, urgency, nocturia, hesitancy, discharge, hematuria or flank pain. Musculoskeletal: Denies arthralgias, myalgias, stiffness, jt. swelling, pain, limping or strain/sprain.  Skin: Denies pruritus, rash, hives, warts, acne, eczema or change in skin lesion(s). Neuro: No weakness, tremor, incoordination, spasms, paresthesia or pain. Psychiatric: Denies confusion, memory loss or sensory loss. Endo: Denies change in weight, skin or hair change.  Heme/Lymph: No excessive bleeding, bruising or enlarged lymph nodes.  Physical Exam  BP 128/62   Pulse 72   Temp (!) 97.3 F (36.3 C)   Resp 16   Ht 5\' 3"  (1.6 m)   Wt 144 lb (65.3 kg)   BMI 25.51 kg/m   Appears well nourished, well groomed  and in no distress.  Eyes: PERRLA, EOMs, conjunctiva no swelling or erythema. Sinuses: No frontal/maxillary tenderness ENT/Mouth: EAC's clear, TM's nl w/o erythema, bulging. Nares clear w/o erythema, swelling, exudates. Oropharynx clear without erythema or exudates. Oral hygiene is good. Tongue normal, non obstructing. Hearing intact.  Neck: Supple. Thyroid not palpable.  Car 2+/2+ without bruits, nodes or JVD. Chest: Respirations nl with BS clear & equal w/o rales, rhonchi, wheezing or stridor.  Cor: Heart sounds normal w/ regular rate and rhythm  without sig. murmurs, gallops, clicks or rubs. Peripheral pulses normal and equal  without edema.  Abdomen: Soft & bowel sounds normal. Non-tender w/o guarding, rebound, hernias, masses or organomegaly.  Lymphatics: Unremarkable.  Musculoskeletal: Full ROM all peripheral extremities, joint stability, 5/5 strength and normal gait.  Skin: Warm, dry without exposed rashes, lesions or ecchymosis apparent.  Neuro: Cranial nerves intact, reflexes equal bilaterally. Sensory-motor testing grossly intact. Tendon reflexes grossly intact.  Pysch: Alert & oriented x 3.  Insight and judgement nl & appropriate. No ideations.  Assessment and Plan:  - Continue medication, monitor blood pressure at home.  - Continue DASH diet. Reminder to go to the ER if any CP,  SOB, nausea, dizziness, severe HA, changes vision/speech.  - Continue diet/meds, exercise,& lifestyle modifications.  - Continue monitor periodic cholesterol/liver & renal functions   - Continue diet, exercise, lifestyle modifications.  - Monitor appropriate labs. - Continue supplementation.      Discussed  regular exercise, BP monitoring, weight control to achieve/maintain BMI less than 25 and discussed med and SE's. Recommended labs to assess and monitor clinical status with further disposition pending results of labs. Over 30 minutes of exam, counseling, chart review was performed.

## 2017-12-25 DIAGNOSIS — E559 Vitamin D deficiency, unspecified: Secondary | ICD-10-CM | POA: Diagnosis not present

## 2017-12-25 DIAGNOSIS — N183 Chronic kidney disease, stage 3 (moderate): Secondary | ICD-10-CM | POA: Diagnosis not present

## 2017-12-25 DIAGNOSIS — E039 Hypothyroidism, unspecified: Secondary | ICD-10-CM | POA: Diagnosis not present

## 2017-12-25 DIAGNOSIS — K219 Gastro-esophageal reflux disease without esophagitis: Secondary | ICD-10-CM | POA: Diagnosis not present

## 2017-12-25 DIAGNOSIS — E782 Mixed hyperlipidemia: Secondary | ICD-10-CM | POA: Diagnosis not present

## 2017-12-25 DIAGNOSIS — Z79899 Other long term (current) drug therapy: Secondary | ICD-10-CM | POA: Diagnosis not present

## 2017-12-25 DIAGNOSIS — E1122 Type 2 diabetes mellitus with diabetic chronic kidney disease: Secondary | ICD-10-CM | POA: Diagnosis not present

## 2017-12-25 DIAGNOSIS — I10 Essential (primary) hypertension: Secondary | ICD-10-CM | POA: Diagnosis not present

## 2017-12-28 LAB — CBC WITH DIFFERENTIAL/PLATELET
Basophils Absolute: 71 cells/uL (ref 0–200)
Basophils Relative: 1 %
EOS ABS: 270 {cells}/uL (ref 15–500)
EOS PCT: 3.8 %
HEMATOCRIT: 34.8 % — AB (ref 35.0–45.0)
HEMOGLOBIN: 11.6 g/dL — AB (ref 11.7–15.5)
LYMPHS ABS: 1647 {cells}/uL (ref 850–3900)
MCH: 30.5 pg (ref 27.0–33.0)
MCHC: 33.3 g/dL (ref 32.0–36.0)
MCV: 91.6 fL (ref 80.0–100.0)
MPV: 10.3 fL (ref 7.5–12.5)
Monocytes Relative: 8.7 %
NEUTROS ABS: 4494 {cells}/uL (ref 1500–7800)
Neutrophils Relative %: 63.3 %
Platelets: 271 10*3/uL (ref 140–400)
RBC: 3.8 10*6/uL (ref 3.80–5.10)
RDW: 11.9 % (ref 11.0–15.0)
Total Lymphocyte: 23.2 %
WBC mixed population: 618 cells/uL (ref 200–950)
WBC: 7.1 10*3/uL (ref 3.8–10.8)

## 2017-12-28 LAB — BASIC METABOLIC PANEL WITH GFR
BUN/Creatinine Ratio: 15 (calc) (ref 6–22)
BUN: 18 mg/dL (ref 7–25)
CO2: 32 mmol/L (ref 20–32)
CREATININE: 1.19 mg/dL — AB (ref 0.60–0.88)
Calcium: 9.7 mg/dL (ref 8.6–10.4)
Chloride: 103 mmol/L (ref 98–110)
GFR, EST AFRICAN AMERICAN: 49 mL/min/{1.73_m2} — AB (ref >=60)
GFR, Est Non African American: 42 mL/min/{1.73_m2} — ABNORMAL LOW (ref >=60)
Glucose, Bld: 120 mg/dL — ABNORMAL HIGH (ref 65–99)
Potassium: 3.8 mmol/L (ref 3.5–5.3)
Sodium: 142 mmol/L (ref 135–146)

## 2017-12-28 LAB — HEPATIC FUNCTION PANEL
AG Ratio: 1.8 (calc) (ref 1.0–2.5)
ALKALINE PHOSPHATASE (APISO): 56 U/L (ref 33–130)
ALT: 12 U/L (ref 6–29)
AST: 21 U/L (ref 10–35)
Albumin: 4.4 g/dL (ref 3.6–5.1)
Bilirubin, Direct: 0.1 mg/dL (ref 0.0–0.2)
Globulin: 2.4 g/dL (calc) (ref 1.9–3.7)
Indirect Bilirubin: 0.3 mg/dL (calc) (ref 0.2–1.2)
TOTAL PROTEIN: 6.8 g/dL (ref 6.1–8.1)
Total Bilirubin: 0.4 mg/dL (ref 0.2–1.2)

## 2017-12-28 LAB — VITAMIN D 25 HYDROXY (VIT D DEFICIENCY, FRACTURES): Vit D, 25-Hydroxy: 68 ng/mL (ref 30–100)

## 2017-12-28 LAB — LIPID PANEL
CHOLESTEROL: 126 mg/dL (ref <200)
HDL: 51 mg/dL (ref >50)
LDL Cholesterol (Calc): 57 mg/dL (calc)
Non-HDL Cholesterol (Calc): 75 mg/dL (calc) (ref <130)
Total CHOL/HDL Ratio: 2.5 (calc) (ref <5.0)
Triglycerides: 92 mg/dL (ref <150)

## 2017-12-28 LAB — INSULIN, RANDOM: INSULIN: 3.5 u[IU]/mL (ref 2.0–19.6)

## 2017-12-28 LAB — HEMOGLOBIN A1C
HEMOGLOBIN A1C: 6.3 %{Hb} — AB (ref <5.7)
MEAN PLASMA GLUCOSE: 134 (calc)
eAG (mmol/L): 7.4 (calc)

## 2017-12-28 LAB — MAGNESIUM: MAGNESIUM: 2 mg/dL (ref 1.5–2.5)

## 2017-12-28 LAB — TSH: TSH: 1.11 mIU/L (ref 0.40–4.50)

## 2018-01-06 ENCOUNTER — Other Ambulatory Visit: Payer: Self-pay | Admitting: Internal Medicine

## 2018-01-12 ENCOUNTER — Encounter: Payer: Self-pay | Admitting: Adult Health

## 2018-01-12 ENCOUNTER — Ambulatory Visit (INDEPENDENT_AMBULATORY_CARE_PROVIDER_SITE_OTHER): Payer: Medicare Other | Admitting: Adult Health

## 2018-01-12 VITALS — BP 110/60 | HR 60 | Temp 98.1°F | Ht 63.0 in | Wt 144.0 lb

## 2018-01-12 DIAGNOSIS — R1030 Lower abdominal pain, unspecified: Secondary | ICD-10-CM | POA: Diagnosis not present

## 2018-01-12 DIAGNOSIS — R110 Nausea: Secondary | ICD-10-CM

## 2018-01-12 MED ORDER — ONDANSETRON HCL 4 MG PO TABS: 4.0000 mg | ORAL_TABLET | Freq: Three times a day (TID) | ORAL | 0 refills | Status: DC | PRN

## 2018-01-12 NOTE — Progress Notes (Signed)
Assessment and Plan:  [name] was seen today for diarrhea, gas and pain.  Diagnoses and all orders for this visit:  Lower abdominal pain       Advised to stop colace, try simethicone - zofran prescription provided for nausea       Continue to monitor symptoms, anticipate are self limited and will resolve -     CBC with Differential/Platelet -     BASIC METABOLIC PANEL WITH GFR -     Hepatic function panel -     Urinalysis w microscopic + reflex cultur -     POC Hemoccult Bld/Stl (3-Cd Home Screen); Future  Further disposition pending results of labs. Discussed med's effects and SE's.   Over 30 minutes of exam, counseling, chart review, and critical decision making was performed.   Future Appointments  Date Time Provider Department Center  03/29/2018  2:30 PM Judd Gaudier, NP [facility] None  07/01/2018  3:00 PM [name], MD [facility] None    ------------------------------------------------------------------------------------------------------------------   HPI BP 110/60   Pulse 60   Temp 98.1 F (36.7 C)   Ht 5\' 3"  (1.6 m)   Wt 144 lb (65.3 kg)   SpO2 95%   BMI 25.51 kg/m   84 y.o.female presents for diarrhea/very loose stools "mushy" that began 4 days ago; [name] reports stools were initially very dark then became yellow and has remained this way. [name] also endorses increased gas. [name] also endorses generalized lower abdominal that is intermittent, "like gas pressure," at it's worse is 4-5/10, fully resolves in between episodes. [name] also endorses intermittent queasiness/nausea without emesis. [name] feels symptoms are stable and not really getting better or worse.   [name] is s/p abdominal hysterectomy, cholecystecomy in 2012, appendectomy in childhood. [name] has had a colonoscopy in 2011 which was unremarkable by Dr. Madilyn Fireman.   [name] has tried bean-o which has not been helpful. [name] continues to take colace 100 mg nightly.   Past Medical History:  Diagnosis Date  . Anemia   .  Anxiety   . Arthritis   . Constipation   . Depression   . GERD (gastroesophageal reflux disease)   . History of blood transfusion   . History of toe surgery left  . Hyperlipidemia   . Hypertension   . Hypothyroidism   . Insomnia   . Thyroid disease   . Type II or unspecified type diabetes mellitus with renal manifestations, not stated as uncontrolled(250.40)    stage II, does not take medication  . Wears glasses      Allergies  Allergen Reactions  . Codeine Rash    rash  . Vasotec [Enalapril] Other (See Comments)    cough  . Augmentin [Amoxicillin-Pot Clavulanate] Other (See Comments)    vaginitis  . Hytrin [Terazosin] Other (See Comments)    incontinence   . Oxycodone Rash    rash  . Procardia [Nifedipine] Other (See Comments)    edema    Current Outpatient Medications on File Prior to Visit  Medication Sig  . aspirin 81 MG tablet Take 81 mg by mouth daily.  [name] Kitchen atenolol (TENORMIN) 100 MG tablet take 1 tablet by mouth once daily for blood pressure  . atorvastatin (LIPITOR) 80 MG tablet take 1 tablet by mouth once daily  . Biotin 10 MG CAPS Take 1 tablet by mouth daily.   . cholecalciferol (VITAMIN D) 1000 UNITS tablet Take 1,000 Units by mouth daily.  . citalopram (CELEXA) 20 MG tablet Take 1 tablet daily for Mood  .  docusate sodium (COLACE) 100 MG capsule Take 1 capsule (100 mg total) by mouth 3 (three) times daily as needed.  . furosemide (LASIX) 40 MG tablet take 1 tablet by mouth once daily  . glucose blood test strip Check blood sugar 1 time daily-DX-E11.22.  . lansoprazole (PREVACID) 15 MG capsule Take 15 mg by mouth daily at 12 noon.  [name] Kitchen. losartan (COZAAR) 100 MG tablet take 1 tablet by mouth once daily  . Magnesium 400 MG CAPS Take 1 capsule by mouth daily.   [name] Kitchen. mexiletine (MEXITIL) 150 MG capsule take 1 capsule by mouth three times a day  . minoxidil (LONITEN) 10 MG tablet Take 1/2 to 1 tablet daily for BP  . Multiple Vitamins-Minerals (MULTIVITAMIN WITH  MINERALS) tablet Take 1 tablet by mouth daily.  [name] Kitchen. PRODIGY LANCETS 28G MISC Check blood sugar 1 time daily-DX-E11.22.  [name] Kitchen. SYNTHROID 100 MCG tablet take 1 tablet by mouth once daily  . traZODone (DESYREL) 300 MG tablet Take 1 tablet at hour of sleep  . verapamil (VERELAN PM) 120 MG 24 hr capsule take 1 capsule by mouth twice a day with food   No current facility-administered medications on file prior to visit.     ROS: all negative except above.   Physical Exam:  BP 110/60   Pulse 60   Temp 98.1 F (36.7 C)   Ht 5\' 3"  (1.6 m)   Wt 144 lb (65.3 kg)   SpO2 95%   BMI 25.51 kg/m   General Appearance: Well nourished, in no apparent distress. Eyes: PERRLA, EOMs, conjunctiva no swelling or erythema Sinuses: No Frontal/maxillary tenderness ENT/Mouth: Ext aud canals clear, TMs without erythema, bulging. No erythema, swelling, or exudate on post pharynx.  Tonsils not swollen or erythematous. Hearing normal.  Neck: Supple, thyroid normal.  Respiratory: Respiratory effort normal, BS equal bilaterally without rales, rhonchi, wheezing or stridor.  Cardio: RRR with no MRGs. Brisk peripheral pulses without edema.  Abdomen: Soft, + BS, very active throughout. Very mild RLQ tenderness, no guarding, rebound, hernias, masses. Gasseous sounds with palpation -  Lymphatics: Non tender without lymphadenopathy.  Musculoskeletal: normal gait.  Skin: Warm, dry without rashes, lesions, ecchymosis.  Psych: Awake and oriented X 3, normal affect, Insight and Judgment appropriate.     Dan MakerAshley C Kyndahl Jablon, NP 11:41 AM [facility]

## 2018-01-12 NOTE — Patient Instructions (Signed)
Stop colace, do hemoccult at home, can try any OTC simethicone for gassiness    10 Tips on Belching, Bloating, and Flatulence 1. Belching is caused by swallowed air from:  Eating or drinking too fast  Poorly fitting dentures; not chewing food completely  Carbonated beverages  Chewing gum or sucking on hard candies  Excessive swallowing due to nervous tension or postnasal drip  Forced belching to relieve abdominal discomfort 2. To prevent excessive belching, avoid:  Carbonated beverages  Chewing gum  Hard candies  Simethicone/GasX may be helpful  3. Abdominal bloating and discomfort may be due to intestinal sensitivity or symptoms of irritable bowel syndrome. To relieve symptoms, avoid:  Broccoli  Baked beans  Cabbage  Carbonated drinks  Cauliflower  Chewing gum  Hard candy 4. Abdominal distention resulting from weak abdominal muscles:  Is better in the morning  Gets worse as the day progresses  Is relieved by lying down 5. To prevent Abdominal distention:  Tighten abdominal muscles by pulling in your stomach several times during the day  Do sit-up exercises if possible  Wear an abdominal support garment if exercise is too difficult 6. Flatulence is gas created through bacterial action in the bowel and passed rectally. Keep in mind that:  10-18 passages per day are normal  Primary gases are harmless and odorless  Noticeable smells are trace gases related to food intake 7. Foods that are likely to form gas include:  Milk, dairy products, and medications that contain lactose--If your body doesn't produce the enzyme (lactase) to break it down.  Certain vegetables--baked beans, cauliflower, broccoli, cabbage  Certain starches--wheat, oats, corn, potatoes. Rice is a good substitute. 8. Identify offending foods. Reduce or eliminate these gas-forming foods from your diet.

## 2018-01-13 ENCOUNTER — Other Ambulatory Visit: Payer: Self-pay | Admitting: Adult Health

## 2018-01-13 ENCOUNTER — Ambulatory Visit: Payer: Self-pay | Admitting: Internal Medicine

## 2018-01-13 DIAGNOSIS — D649 Anemia, unspecified: Secondary | ICD-10-CM

## 2018-01-13 LAB — HEPATIC FUNCTION PANEL
AG Ratio: 1.8 (calc) (ref 1.0–2.5)
ALBUMIN MSPROF: 4.2 g/dL (ref 3.6–5.1)
ALT: 10 U/L (ref 6–29)
AST: 19 U/L (ref 10–35)
Alkaline phosphatase (APISO): 53 U/L (ref 33–130)
BILIRUBIN DIRECT: 0.1 mg/dL (ref 0.0–0.2)
Globulin: 2.3 g/dL (calc) (ref 1.9–3.7)
Indirect Bilirubin: 0.3 mg/dL (calc) (ref 0.2–1.2)
TOTAL PROTEIN: 6.5 g/dL (ref 6.1–8.1)
Total Bilirubin: 0.4 mg/dL (ref 0.2–1.2)

## 2018-01-13 LAB — CBC WITH DIFFERENTIAL/PLATELET
BASOS PCT: 1 %
Basophils Absolute: 78 cells/uL (ref 0–200)
EOS PCT: 2.4 %
Eosinophils Absolute: 187 cells/uL (ref 15–500)
HCT: 33.7 % — ABNORMAL LOW (ref 35.0–45.0)
Hemoglobin: 10.9 g/dL — ABNORMAL LOW (ref 11.7–15.5)
LYMPHS ABS: 1872 {cells}/uL (ref 850–3900)
MCH: 29.3 pg (ref 27.0–33.0)
MCHC: 32.3 g/dL (ref 32.0–36.0)
MCV: 90.6 fL (ref 80.0–100.0)
MPV: 10.1 fL (ref 7.5–12.5)
Monocytes Relative: 7.2 %
NEUTROS PCT: 65.4 %
Neutro Abs: 5101 cells/uL (ref 1500–7800)
PLATELETS: 264 10*3/uL (ref 140–400)
RBC: 3.72 10*6/uL — AB (ref 3.80–5.10)
RDW: 11.8 % (ref 11.0–15.0)
Total Lymphocyte: 24 %
WBC mixed population: 562 cells/uL (ref 200–950)
WBC: 7.8 10*3/uL (ref 3.8–10.8)

## 2018-01-13 LAB — BASIC METABOLIC PANEL WITH GFR
BUN/Creatinine Ratio: 14 (calc) (ref 6–22)
BUN: 18 mg/dL (ref 7–25)
CALCIUM: 9.8 mg/dL (ref 8.6–10.4)
CHLORIDE: 102 mmol/L (ref 98–110)
CO2: 32 mmol/L (ref 20–32)
Creat: 1.27 mg/dL — ABNORMAL HIGH (ref 0.60–0.88)
GFR, Est African American: 45 mL/min/{1.73_m2} — ABNORMAL LOW (ref >=60)
GFR, Est Non African American: 39 mL/min/{1.73_m2} — ABNORMAL LOW (ref >=60)
Glucose, Bld: 108 mg/dL — ABNORMAL HIGH (ref 65–99)
POTASSIUM: 3.6 mmol/L (ref 3.5–5.3)
Sodium: 142 mmol/L (ref 135–146)

## 2018-01-13 LAB — URINALYSIS W MICROSCOPIC + REFLEX CULTURE
BACTERIA UA: NONE SEEN /HPF
Bilirubin Urine: NEGATIVE
Glucose, UA: NEGATIVE
Hgb urine dipstick: NEGATIVE
Hyaline Cast: NONE SEEN /LPF
Ketones, ur: NEGATIVE
Leukocyte Esterase: NEGATIVE
Nitrites, Initial: NEGATIVE
Protein, ur: NEGATIVE
SPECIFIC GRAVITY, URINE: 1.019 (ref 1.001–1.03)
SQUAMOUS EPITHELIAL / LPF: NONE SEEN /HPF (ref <=5)
WBC, UA: NONE SEEN /HPF (ref 0–5)
pH: 7 (ref 5.0–8.0)

## 2018-01-13 LAB — NO CULTURE INDICATED

## 2018-01-19 ENCOUNTER — Ambulatory Visit (INDEPENDENT_AMBULATORY_CARE_PROVIDER_SITE_OTHER): Payer: Medicare Other | Admitting: *Deleted

## 2018-01-19 DIAGNOSIS — D649 Anemia, unspecified: Secondary | ICD-10-CM

## 2018-01-19 NOTE — Progress Notes (Signed)
Patient is here for a NV to recheck a CBC due to reduced hemoglobin.  She reports no bleeding. Patient was given an hemoccult card to return.

## 2018-01-20 LAB — CBC WITH DIFFERENTIAL/PLATELET
BASOS PCT: 0.9 %
Basophils Absolute: 67 cells/uL (ref 0–200)
Eosinophils Absolute: 170 cells/uL (ref 15–500)
Eosinophils Relative: 2.3 %
HCT: 32.8 % — ABNORMAL LOW (ref 35.0–45.0)
Hemoglobin: 11 g/dL — ABNORMAL LOW (ref 11.7–15.5)
Lymphs Abs: 1776 cells/uL (ref 850–3900)
MCH: 30.3 pg (ref 27.0–33.0)
MCHC: 33.5 g/dL (ref 32.0–36.0)
MCV: 90.4 fL (ref 80.0–100.0)
MONOS PCT: 8.1 %
MPV: 9.9 fL (ref 7.5–12.5)
NEUTROS PCT: 64.7 %
Neutro Abs: 4788 cells/uL (ref 1500–7800)
PLATELETS: 274 10*3/uL (ref 140–400)
RBC: 3.63 10*6/uL — AB (ref 3.80–5.10)
RDW: 12.2 % (ref 11.0–15.0)
TOTAL LYMPHOCYTE: 24 %
WBC mixed population: 599 cells/uL (ref 200–950)
WBC: 7.4 10*3/uL (ref 3.8–10.8)

## 2018-01-26 ENCOUNTER — Other Ambulatory Visit: Payer: Self-pay

## 2018-01-26 DIAGNOSIS — R1030 Lower abdominal pain, unspecified: Secondary | ICD-10-CM

## 2018-01-26 DIAGNOSIS — Z1212 Encounter for screening for malignant neoplasm of rectum: Secondary | ICD-10-CM | POA: Diagnosis not present

## 2018-01-26 LAB — POC HEMOCCULT BLD/STL (HOME/3-CARD/SCREEN)
FECAL OCCULT BLD: NEGATIVE
FECAL OCCULT BLD: NEGATIVE
Fecal Occult Blood, POC: NEGATIVE

## 2018-01-27 ENCOUNTER — Other Ambulatory Visit: Payer: Self-pay | Admitting: Internal Medicine

## 2018-02-01 ENCOUNTER — Other Ambulatory Visit: Payer: Self-pay | Admitting: Internal Medicine

## 2018-02-12 ENCOUNTER — Other Ambulatory Visit: Payer: Self-pay | Admitting: Internal Medicine

## 2018-03-09 ENCOUNTER — Telehealth: Payer: Self-pay | Admitting: *Deleted

## 2018-03-09 NOTE — Telephone Encounter (Signed)
Patient called and complained of a sore throat with sinus drainage and cough.  She reports the drainage is clear to whitish.Per Dr Oneta Rack, she can try Delsym for the cough and Dayquil in the daytime and Nyquil at night. Patient was advised to call for an OV if she does not improve.

## 2018-03-17 ENCOUNTER — Other Ambulatory Visit: Payer: Self-pay | Admitting: *Deleted

## 2018-03-17 MED ORDER — MINOXIDIL 10 MG PO TABS: ORAL_TABLET | ORAL | 1 refills | Status: AC

## 2018-03-28 DIAGNOSIS — R7303 Prediabetes: Secondary | ICD-10-CM | POA: Insufficient documentation

## 2018-03-28 NOTE — Progress Notes (Signed)
MEDICARE ANNUAL WELLNESS VISIT AND FOLLOW UP  Assessment:   Diagnoses and all orders for this visit:  Encounter for Medicare annual wellness exam  Essential hypertension Continue medications - will try switching atenolol to propranolol to see if this is helpful with intermittent hand tremors Monitor blood pressure at home; call if consistently over 130/80 Continue DASH diet.   Reminder to go to the ER if any CP, SOB, nausea, dizziness, severe HA, changes vision/speech, left arm numbness and tingling and jaw pain.  Gastroesophageal reflux disease without esophagitis Well managed on current medications Discussed diet, avoiding triggers and other lifestyle changes  Hypothyroidism, unspecified type continue medications the same pending lab results reminded to take on an empty stomach 30-2mins before food.  check TSH level  CKD stage 3 due to type 2 diabetes mellitus (HCC) Increase fluids, avoid NSAIDS, monitor sugars, will monitor CMP/GFR  Radiculopathy, unspecified spinal region Resolved after surgery. Follow up with Dr. Modesto Charon as needed.   Osteoarthritis, unspecified osteoarthritis type, unspecified site Continue medications  Vitamin D deficiency At goal at recent check; continue to recommend supplementation for goal of 70-100 Defer vitamin D level  Mixed hyperlipidemia Continue medications Continue low cholesterol diet and exercise.  Check lipid panel.   Medication management CBC, CMP/GFR  Depression, major, recurrent, in partial remission (HCC) Continue medications  Lifestyle discussed: diet/exerise, sleep hygiene, stress management, hydration   Over 40 minutes of exam, counseling, chart review and critical decision making was performed Future Appointments  Date Time Provider Department Center  07/01/2018  3:00 PM [name], MD [facility] None     Plan:   During the course of the visit the patient was educated and counseled about appropriate screening  and preventive services including:    Pneumococcal vaccine   Prevnar 13  Influenza vaccine  Td vaccine  Screening electrocardiogram  Bone densitometry screening  Colorectal cancer screening  Diabetes screening  Glaucoma screening  Nutrition counseling   Advanced directives: requested   Subjective:  [name] is a 84 y.o. female who presents for Medicare Annual Wellness Visit and 3 month follow up. She has DJD with radiculopathy, follows Dr. Regino Schultze, has history of PVC's, follows Dr. Delton See, recent Echo 01/2015. She reports chronic intermittent resting tremor is beginning to bother her more when writing. She is not currently taking any medications to help with this.   BMI is Body mass index is 24.98 kg/m., she has been working on diet and exercise. Wt Readings from Last 3 Encounters:  03/29/18 141 lb (64 kg)  01/12/18 144 lb (65.3 kg)  12/24/17 144 lb (65.3 kg)   Her blood pressure has been controlled at home, today their BP is BP: 110/66 She does workout. She denies chest pain, shortness of breath, dizziness.   She is on cholesterol medication (atorvastatin 80 mg daily) and denies myalgias. Her cholesterol is at goal. The cholesterol last visit was:   Lab Results  Component Value Date   CHOL 126 12/25/2017   HDL 51 12/25/2017   LDLCALC 57 12/25/2017   TRIG 92 12/25/2017   CHOLHDL 2.5 12/25/2017    She has been working on diet and exercise for prediabetes, and denies foot ulcerations, increased appetite, nausea, paresthesia of the feet, polydipsia, polyuria, visual disturbances, vomiting and weight loss. Last A1C in the office was:  Lab Results  Component Value Date   HGBA1C 6.3 (H) 12/25/2017   She is on thyroid medication. Her medication was not changed last visit.   Lab Results  Component Value Date   TSH 1.11 12/25/2017   Last GFR: Lab Results  Component Value Date   GFRNONAA 39 (L) 01/12/2018   Patient is on Vitamin D supplement and at goal at  recent check:   Lab Results  Component Value Date   VD25OH 68 12/25/2017      Medication Review: Current Outpatient Medications on File Prior to Visit  Medication Sig Dispense Refill  . aspirin 81 MG tablet Take 81 mg by mouth daily.    [name] Kitchen atenolol (TENORMIN) 100 MG tablet take 1 tablet by mouth once daily for blood pressure 90 tablet 1  . atorvastatin (LIPITOR) 80 MG tablet TAKE 1 TABLET BY MOUTH ONCE DAIL\Y 90 tablet 1  . Biotin 10 MG CAPS Take 1 tablet by mouth daily.     . cholecalciferol (VITAMIN D) 1000 UNITS tablet Take 1,000 Units by mouth daily.    . citalopram (CELEXA) 20 MG tablet TAKE 1 TABLET BY MOUTH ONCE DAILY FOR MOOD 90 tablet 0  . docusate sodium (COLACE) 100 MG capsule Take 1 capsule (100 mg total) by mouth 3 (three) times daily as needed. 20 capsule 0  . furosemide (LASIX) 40 MG tablet take 1 tablet by mouth once daily 90 tablet 1  . glucose blood test strip Check blood sugar 1 time daily-DX-E11.22. 100 each 1  . lansoprazole (PREVACID) 15 MG capsule Take 15 mg by mouth daily at 12 noon.    [name] Kitchen levothyroxine (SYNTHROID, LEVOTHROID) 100 MCG tablet take 1 tablet by mouth every morning 90 tablet 3  . losartan (COZAAR) 100 MG tablet take 1 tablet by mouth once daily 90 tablet 1  . Magnesium 400 MG CAPS Take 1 capsule by mouth daily.     [name] Kitchen mexiletine (MEXITIL) 150 MG capsule take 1 capsule by mouth three times a day 270 capsule 1  . minoxidil (LONITEN) 10 MG tablet Take 1/2 to 1 tablet daily for BP 90 tablet 1  . Multiple Vitamins-Minerals (MULTIVITAMIN WITH MINERALS) tablet Take 1 tablet by mouth daily.    . ondansetron (ZOFRAN) 4 MG tablet Take 1 tablet (4 mg total) by mouth every 8 (eight) hours as needed for nausea or vomiting. 60 tablet 0  . PRODIGY LANCETS 28G MISC Check blood sugar 1 time daily-DX-E11.22. 100 each 1  . trazodone (DESYREL) 300 MG tablet TAKE 1 TABLET BY MOUTH AT BEDTIME 90 tablet 0  . verapamil (VERELAN PM) 120 MG 24 hr capsule take 1 capsule by mouth  twice a day with food 180 capsule 1   No current facility-administered medications on file prior to visit.     Allergies  Allergen Reactions  . Codeine Rash    rash  . Vasotec [Enalapril] Other (See Comments)    cough  . Augmentin [Amoxicillin-Pot Clavulanate] Other (See Comments)    vaginitis  . Hytrin [Terazosin] Other (See Comments)    incontinence   . Oxycodone Rash    rash  . Procardia [Nifedipine] Other (See Comments)    edema    Current Problems (verified) Patient Active Problem List   Diagnosis Date Noted  . Prediabetes 03/28/2018  . Gastroesophageal reflux disease without esophagitis 12/01/2016  . BMI 25.0-25.9,adult 10/02/2015  . Encounter for Medicare annual wellness exam 10/02/2015  . Depression, major, recurrent, in partial remission (HCC) 02/15/2015  . Vitamin D deficiency 06/14/2014  . Medication management 06/14/2014  . CKD stage 3 due to type 2 diabetes mellitus (HCC) 06/14/2014  . Hypothyroidism 12/02/2013  . Essential hypertension 12/02/2013  .  DJD 12/02/2013  . Mixed hyperlipidemia 12/02/2013    Screening Tests Immunization History  Administered Date(s) Administered  . Influenza, High Dose Seasonal PF 09/05/2014, 10/02/2015, 07/24/2016, 09/16/2017  . Influenza-Unspecified 09/10/2013  . Pneumococcal Conjugate-13 07/24/2016  . Pneumococcal-Unspecified 11/26/2012  . Td 11/26/2012  . Tdap 08/24/2014   Preventative care: Last colonoscopy: 2011 Last mammogram: 04/2017 Last pap smear/pelvic exam: Remote   DEXA: 2003, will repeat today  Prior vaccinations: TD or Tdap: 2015  Influenza: 2014 Pneumococcal: 2014 Prevnar13: 2017 Shingles/Zostavax: Declines  Names of Other Physician/Practitioners you currently use: 1. [facility] here for primary care 2. [facility], Dr. Burgess Estelle, eye doctor, last 2019 3. Dr. Olegario Shearer, dentist, 2019   Patient Care Team: [name], MD as PCP - General  (Internal Medicine)  SURGICAL HISTORY She  has a past surgical history that includes Abdominal hysterectomy; Tonsillectomy; Appendectomy; Cerebral aneurysm repair (09/2007); Joint replacement (Left, 2012); Total knee arthroplasty (Right, 2005); Total knee revision (Right, 2007); Hammer toe surgery (2008); Ganglion cyst excision (Left, 2008); Cholecystectomy (2012); Colonoscopy; Shoulder arthroscopy with rotator cuff repair and subacromial decompression (Right, 01/29/2015); Back surgery (02/06/2016); Carpal tunnel release (Left, 07/08/2016); and Breast biopsy. FAMILY HISTORY Her family history includes Cancer in her father, maternal aunt, and mother; Hypertension in her father and mother; Mental illness in her mother. SOCIAL HISTORY She  reports that she has never smoked. She has never used smokeless tobacco. She reports that she drinks alcohol. She reports that she does not use drugs.   MEDICARE WELLNESS OBJECTIVES: Physical activity: Current Exercise Habits: Home exercise routine, Type of exercise: walking;strength training/weights, Time (Minutes): 30, Frequency (Times/Week): 7, Weekly Exercise (Minutes/Week): 210, Intensity: Mild, Exercise limited by: None identified Cardiac risk factors: Cardiac Risk Factors include: advanced age (>77men, >30 women);dyslipidemia;hypertension Depression/mood screen:   Depression screen [facility] 2/9 03/29/2018  Decreased Interest 0  Down, Depressed, Hopeless 0  PHQ - 2 Score 0    ADLs:  In your present state of health, do you have any difficulty performing the following activities: 03/29/2018 12/24/2017  Hearing? N N  Vision? N N  Difficulty concentrating or making decisions? N N  Walking or climbing stairs? N N  Dressing or bathing? N N  Doing errands, shopping? N N  Preparing Food and eating ? N -  Using the Toilet? N -  In the past six months, have you accidently leaked urine? N -  Do you have problems with loss of bowel control? N -  Managing your  Medications? N -  Managing your Finances? N -  Housekeeping or managing your Housekeeping? N -  Some recent data might be hidden     Cognitive Testing  Alert? Yes  Normal Appearance?Yes  Oriented to person? Yes  Place? Yes   Time? Yes  Recall of three objects?  Yes  Can perform simple calculations? Yes  Displays appropriate judgment?Yes  Can read the correct time from a watch face?Yes  EOL planning: Does Patient Have a Medical Advance Directive?: Yes Type of Advance Directive: Healthcare Power of Attorney Does patient want to make changes to medical advance directive?: No - Patient declined Copy of Healthcare Power of Attorney in Chart?: No - copy requested  Review of Systems  Constitutional: Negative for malaise/fatigue and weight loss.  HENT: Negative for hearing loss and tinnitus.   Eyes: Negative for blurred vision and double vision.  Respiratory: Negative for cough, sputum production, shortness of breath and wheezing.   Cardiovascular: Negative for chest pain,  palpitations, orthopnea, claudication, leg swelling and PND.  Gastrointestinal: Negative for abdominal pain, blood in stool, constipation, diarrhea, heartburn, melena, nausea and vomiting.  Genitourinary: Negative.   Musculoskeletal: Negative for falls, joint pain and myalgias.  Skin: Negative for rash.  Neurological: Positive for tremors (intermittent, mild, bilateral hands). Negative for dizziness, tingling, sensory change, weakness and headaches.  Endo/Heme/Allergies: Negative for polydipsia.  Psychiatric/Behavioral: Negative.  Negative for depression, memory loss, substance abuse and suicidal ideas. The patient is not nervous/anxious and does not have insomnia.   All other systems reviewed and are negative.    Objective:     Today's Vitals   03/29/18 1422 03/29/18 1502  BP: (!) 92/48 110/66  Pulse: 67   Temp: 97.9 F (36.6 C)   SpO2: 92%   Weight: 141 lb (64 kg)   Height:  (1.6 m)   PainSc: 3     PainLoc: Shoulder    Body mass index is 24.98 kg/m.  General appearance: alert, no distress, WD/WN, female HEENT: normocephalic, sclerae anicteric, TMs pearly, nares patent, no discharge or erythema, pharynx normal Oral cavity: MMM, no lesions Neck: supple, no lymphadenopathy, no thyromegaly, no masses Heart: RRR, normal S1, S2, no murmurs Lungs: CTA bilaterally, no wheezes, rhonchi, or rales Abdomen: +bs, soft, non tender, non distended, no masses, no hepatomegaly, no splenomegaly Musculoskeletal: nontender, no swelling, no obvious deformity Extremities: no edema, no cyanosis, no clubbing Pulses: 2+ symmetric, upper and lower extremities, normal cap refill Neurological: alert, oriented x 3, CN2-12 intact, strength normal upper extremities and lower extremities, sensation normal throughout, DTRs 2+ throughout, no cerebellar signs, gait normal Psychiatric: normal affect, behavior normal, pleasant   Medicare Attestation I have personally reviewed: The patient's medical and social history Their use of alcohol, tobacco or illicit drugs Their current medications and supplements The patient's functional ability including ADLs,fall risks, home safety risks, cognitive, and hearing and visual impairment Diet and physical activities Evidence for depression or mood disorders  The patient's weight, height, BMI, and visual acuity have been recorded in the chart.  I have made referrals, counseling, and provided education to the patient based on review of the above and I have provided the patient with a written personalized care plan for preventive services.     Dan Maker, NP   03/29/2018

## 2018-03-29 ENCOUNTER — Encounter: Payer: Self-pay | Admitting: Adult Health

## 2018-03-29 ENCOUNTER — Ambulatory Visit (INDEPENDENT_AMBULATORY_CARE_PROVIDER_SITE_OTHER): Payer: Medicare Other | Admitting: Adult Health

## 2018-03-29 VITALS — BP 110/66 | HR 67 | Temp 97.9°F | Ht 63.0 in | Wt 141.0 lb

## 2018-03-29 DIAGNOSIS — M1990 Unspecified osteoarthritis, unspecified site: Secondary | ICD-10-CM

## 2018-03-29 DIAGNOSIS — E039 Hypothyroidism, unspecified: Secondary | ICD-10-CM

## 2018-03-29 DIAGNOSIS — I10 Essential (primary) hypertension: Secondary | ICD-10-CM

## 2018-03-29 DIAGNOSIS — R7303 Prediabetes: Secondary | ICD-10-CM

## 2018-03-29 DIAGNOSIS — E782 Mixed hyperlipidemia: Secondary | ICD-10-CM

## 2018-03-29 DIAGNOSIS — F3341 Major depressive disorder, recurrent, in partial remission: Secondary | ICD-10-CM | POA: Diagnosis not present

## 2018-03-29 DIAGNOSIS — M5410 Radiculopathy, site unspecified: Secondary | ICD-10-CM

## 2018-03-29 DIAGNOSIS — E559 Vitamin D deficiency, unspecified: Secondary | ICD-10-CM | POA: Diagnosis not present

## 2018-03-29 DIAGNOSIS — N183 Chronic kidney disease, stage 3 (moderate): Secondary | ICD-10-CM

## 2018-03-29 DIAGNOSIS — K219 Gastro-esophageal reflux disease without esophagitis: Secondary | ICD-10-CM | POA: Diagnosis not present

## 2018-03-29 DIAGNOSIS — Z79899 Other long term (current) drug therapy: Secondary | ICD-10-CM | POA: Diagnosis not present

## 2018-03-29 DIAGNOSIS — R6889 Other general symptoms and signs: Secondary | ICD-10-CM | POA: Diagnosis not present

## 2018-03-29 DIAGNOSIS — E348 Other specified endocrine disorders: Secondary | ICD-10-CM | POA: Diagnosis not present

## 2018-03-29 DIAGNOSIS — Z0001 Encounter for general adult medical examination with abnormal findings: Secondary | ICD-10-CM | POA: Diagnosis not present

## 2018-03-29 DIAGNOSIS — Z0000 Encounter for general adult medical examination without abnormal findings: Secondary | ICD-10-CM

## 2018-03-29 DIAGNOSIS — E1122 Type 2 diabetes mellitus with diabetic chronic kidney disease: Secondary | ICD-10-CM

## 2018-03-29 MED ORDER — PROPRANOLOL HCL ER 60 MG PO CP24: 60.0000 mg | ORAL_CAPSULE | Freq: Every day | ORAL | 0 refills | Status: DC

## 2018-03-29 NOTE — Patient Instructions (Signed)
Take this medication INSTEAD of atenolol for 2-4 weeks and call to let me know if blood pressures are ok (130/80 or less, not running too low) and if helps with tremor.     Propranolol extended-release capsules What is this medicine? PROPRANOLOL (proe PRAN oh lole) is a beta-blocker. Beta-blockers reduce the workload on the heart and help it to beat more regularly. This medicine is used to treat high blood pressure, heart muscle disease, and prevent chest pain caused by angina. It is also used to prevent migraine headaches. You should not use this medicine to treat a migraine that has already started. This medicine may be used for other purposes; ask your health care provider or pharmacist if you have questions. COMMON BRAND NAME(S): Inderal LA, Inderal XL, InnoPran XL What should I tell my health care provider before I take this medicine? They need to know if you have any of these conditions: -circulation problems, or blood vessel disease -diabetes -history of heart attack or heart disease, vasospastic angina -kidney disease -liver disease -lung or breathing disease, like asthma or emphysema -pheochromocytoma -slow heart rate -thyroid disease -an unusual or allergic reaction to propranolol, other beta-blockers, medicines, foods, dyes, or preservatives -pregnant or trying to get pregnant -breast-feeding How should I use this medicine? Take this medicine by mouth with a glass of water. Follow the directions on the prescription label. Do not crush or chew. Take your doses at regular intervals. Do not take your medicine more often than directed. Do not stop taking except on the advice of your doctor or health care professional. Talk to your pediatrician regarding the use of this medicine in children. Special care may be needed. Overdosage: If you think you have taken too much of this medicine contact a poison control center or emergency room at once. NOTE: This medicine is only for you. Do  not share this medicine with others. What if I miss a dose? If you miss a dose, take it as soon as you can. If it is almost time for your next dose, take only that dose. Do not take double or extra doses. What may interact with this medicine? Do not take this medicine with any of the following medications: -feverfew -phenothiazines like chlorpromazine, mesoridazine, prochlorperazine, thioridazine This medicine may also interact with the following medications: -aluminum hydroxide gel -antipyrine -antiviral medicines for HIV or AIDS -barbiturates like phenobarbital -certain medicines for blood pressure, heart disease, irregular heart beat -cimetidine -ciprofloxacin -diazepam -fluconazole -haloperidol -isoniazid -medicines for cholesterol like cholestyramine or colestipol -medicines for mental depression -medicines for migraine headache like almotriptan, eletriptan, frovatriptan, naratriptan, rizatriptan, sumatriptan, zolmitriptan -NSAIDs, medicines for pain and inflammation, like ibuprofen or naproxen -phenytoin -rifampin -teniposide -theophylline -thyroid medicines -tolbutamide -warfarin -zileuton This list may not describe all possible interactions. Give your health care provider a list of all the medicines, herbs, non-prescription drugs, or dietary supplements you use. Also tell them if you smoke, drink alcohol, or use illegal drugs. Some items may interact with your medicine. What should I watch for while using this medicine? Visit your doctor or health care professional for regular check ups. Contact your doctor right away if your symptoms worsen. Check your blood pressure and pulse rate regularly. Ask your health care professional what your blood pressure and pulse rate should be, and when you should contact them. Do not stop taking this medicine suddenly. This could lead to serious heart-related effects. You may get drowsy or dizzy. Do not drive, use machinery, or do anything  that  needs mental alertness until you know how this drug affects you. Do not stand or sit up quickly, especially if you are an older patient. This reduces the risk of dizzy or fainting spells. Alcohol can make you more drowsy and dizzy. Avoid alcoholic drinks. This medicine can affect blood sugar levels. If you have diabetes, check with your doctor or health care professional before you change your diet or the dose of your diabetic medicine. Do not treat yourself for coughs, colds, or pain while you are taking this medicine without asking your doctor or health care professional for advice. Some ingredients may increase your blood pressure. What side effects may I notice from receiving this medicine? Side effects that you should report to your doctor or health care professional as soon as possible: -allergic reactions like skin rash, itching or hives, swelling of the face, lips, or tongue -breathing problems -changes in blood sugar -cold hands or feet -difficulty sleeping, nightmares -dry peeling skin -hallucinations -muscle cramps or weakness -slow heart rate -swelling of the legs and ankles -vomiting Side effects that usually do not require medical attention (report to your doctor or health care professional if they continue or are bothersome): -change in sex drive or performance -diarrhea -dry sore eyes -hair loss -nausea -weak or tired This list may not describe all possible side effects. Call your doctor for medical advice about side effects. You may report side effects to FDA at 1-800-FDA-1088. Where should I keep my medicine? Keep out of the reach of children. Store at room temperature between 15 and 30 degrees C (59 and 86 degrees F). Protect from light, moisture and freezing. Keep container tightly closed. Throw away any unused medicine after the expiration date. NOTE: This sheet is a summary. It may not cover all possible information. If you have questions about this medicine,  talk to your doctor, pharmacist, or health care provider.  2018 [employer]vier/Gold Standard (2013-07-01 14:58:56)

## 2018-03-30 ENCOUNTER — Other Ambulatory Visit: Payer: Self-pay | Admitting: Adult Health

## 2018-03-30 DIAGNOSIS — E1122 Type 2 diabetes mellitus with diabetic chronic kidney disease: Secondary | ICD-10-CM

## 2018-03-30 DIAGNOSIS — N183 Chronic kidney disease, stage 3 (moderate): Principal | ICD-10-CM

## 2018-03-30 LAB — CBC WITH DIFFERENTIAL/PLATELET
BASOS PCT: 1.1 %
Basophils Absolute: 80 cells/uL (ref 0–200)
EOS PCT: 2.3 %
Eosinophils Absolute: 168 cells/uL (ref 15–500)
HCT: 31.3 % — ABNORMAL LOW (ref 35.0–45.0)
HEMOGLOBIN: 10.5 g/dL — AB (ref 11.7–15.5)
Lymphs Abs: 1832 cells/uL (ref 850–3900)
MCH: 30 pg (ref 27.0–33.0)
MCHC: 33.5 g/dL (ref 32.0–36.0)
MCV: 89.4 fL (ref 80.0–100.0)
MPV: 10.4 fL (ref 7.5–12.5)
Monocytes Relative: 7.2 %
NEUTROS PCT: 64.3 %
Neutro Abs: 4694 cells/uL (ref 1500–7800)
PLATELETS: 269 10*3/uL (ref 140–400)
RBC: 3.5 10*6/uL — ABNORMAL LOW (ref 3.80–5.10)
RDW: 12.4 % (ref 11.0–15.0)
TOTAL LYMPHOCYTE: 25.1 %
WBC mixed population: 526 cells/uL (ref 200–950)
WBC: 7.3 10*3/uL (ref 3.8–10.8)

## 2018-03-30 LAB — COMPLETE METABOLIC PANEL WITH GFR
AG Ratio: 1.6 (calc) (ref 1.0–2.5)
ALBUMIN MSPROF: 4.2 g/dL (ref 3.6–5.1)
ALT: 11 U/L (ref 6–29)
AST: 21 U/L (ref 10–35)
Alkaline phosphatase (APISO): 53 U/L (ref 33–130)
BILIRUBIN TOTAL: 0.5 mg/dL (ref 0.2–1.2)
BUN / CREAT RATIO: 15 (calc) (ref 6–22)
BUN: 24 mg/dL (ref 7–25)
CHLORIDE: 103 mmol/L (ref 98–110)
CO2: 32 mmol/L (ref 20–32)
CREATININE: 1.62 mg/dL — AB (ref 0.60–0.88)
Calcium: 9.5 mg/dL (ref 8.6–10.4)
GFR, EST AFRICAN AMERICAN: 34 mL/min/{1.73_m2} — AB (ref >=60)
GFR, Est Non African American: 29 mL/min/{1.73_m2} — ABNORMAL LOW (ref >=60)
GLUCOSE: 93 mg/dL (ref 65–99)
Globulin: 2.6 g/dL (calc) (ref 1.9–3.7)
Potassium: 3.9 mmol/L (ref 3.5–5.3)
Sodium: 142 mmol/L (ref 135–146)
TOTAL PROTEIN: 6.8 g/dL (ref 6.1–8.1)

## 2018-03-30 LAB — HEMOGLOBIN A1C
EAG (MMOL/L): 7.4 (calc)
Hgb A1c MFr Bld: 6.3 % of total Hgb — ABNORMAL HIGH (ref <5.7)
Mean Plasma Glucose: 134 (calc)

## 2018-03-30 LAB — LIPID PANEL
Cholesterol: 127 mg/dL (ref <200)
HDL: 47 mg/dL — AB (ref >50)
LDL CHOLESTEROL (CALC): 57 mg/dL
NON-HDL CHOLESTEROL (CALC): 80 mg/dL (ref <130)
TRIGLYCERIDES: 151 mg/dL — AB (ref <150)
Total CHOL/HDL Ratio: 2.7 (calc) (ref <5.0)

## 2018-03-30 LAB — TSH: TSH: 1.31 m[IU]/L (ref 0.40–4.50)

## 2018-03-30 LAB — MAGNESIUM: MAGNESIUM: 2.3 mg/dL (ref 1.5–2.5)

## 2018-04-01 ENCOUNTER — Other Ambulatory Visit: Payer: Self-pay | Admitting: Adult Health

## 2018-04-01 DIAGNOSIS — E2839 Other primary ovarian failure: Secondary | ICD-10-CM

## 2018-04-12 ENCOUNTER — Other Ambulatory Visit: Payer: Self-pay | Admitting: Adult Health

## 2018-04-12 DIAGNOSIS — Z1231 Encounter for screening mammogram for malignant neoplasm of breast: Secondary | ICD-10-CM

## 2018-04-27 ENCOUNTER — Ambulatory Visit (INDEPENDENT_AMBULATORY_CARE_PROVIDER_SITE_OTHER): Payer: Medicare Other | Admitting: Internal Medicine

## 2018-04-27 VITALS — BP 94/56 | HR 72 | Temp 97.5°F | Resp 16 | Ht 63.0 in | Wt 140.2 lb

## 2018-04-27 DIAGNOSIS — H1013 Acute atopic conjunctivitis, bilateral: Secondary | ICD-10-CM

## 2018-04-27 DIAGNOSIS — Z79899 Other long term (current) drug therapy: Secondary | ICD-10-CM | POA: Diagnosis not present

## 2018-04-27 DIAGNOSIS — I10 Essential (primary) hypertension: Secondary | ICD-10-CM

## 2018-04-27 DIAGNOSIS — J301 Allergic rhinitis due to pollen: Secondary | ICD-10-CM | POA: Diagnosis not present

## 2018-04-27 LAB — BASIC METABOLIC PANEL WITH GFR
BUN/Creatinine Ratio: 16 (calc) (ref 6–22)
BUN: 22 mg/dL (ref 7–25)
CALCIUM: 9.9 mg/dL (ref 8.6–10.4)
CO2: 32 mmol/L (ref 20–32)
CREATININE: 1.41 mg/dL — AB (ref 0.60–0.88)
Chloride: 102 mmol/L (ref 98–110)
GFR, EST AFRICAN AMERICAN: 40 mL/min/{1.73_m2} — AB (ref >=60)
GFR, EST NON AFRICAN AMERICAN: 34 mL/min/{1.73_m2} — AB (ref >=60)
Glucose, Bld: 118 mg/dL — ABNORMAL HIGH (ref 65–99)
POTASSIUM: 4.2 mmol/L (ref 3.5–5.3)
Sodium: 141 mmol/L (ref 135–146)

## 2018-04-27 MED ORDER — PREDNISOLONE ACETATE 1 % OP SUSP: 1.0000 [drp] | Freq: Four times a day (QID) | OPHTHALMIC | 0 refills | Status: DC

## 2018-04-27 MED ORDER — PREDNISONE 20 MG PO TABS: ORAL_TABLET | ORAL | 0 refills | Status: DC

## 2018-04-28 ENCOUNTER — Other Ambulatory Visit: Payer: Self-pay | Admitting: Internal Medicine

## 2018-04-29 ENCOUNTER — Ambulatory Visit: Payer: Self-pay

## 2018-05-01 ENCOUNTER — Encounter: Payer: Self-pay | Admitting: Internal Medicine

## 2018-05-01 NOTE — Progress Notes (Signed)
Subjective:    Patient ID: [name], female    DOB: September 11, 1933, 84 y.o.   MRN: [id number]  HPI  This very nice 84 yo WWF with HTN, HLD, diet controlled T2_DM, Hypothyroidism, GERD, DJD  & Vit D Def presents with c/o recent flare of allergy sx's when outside with itchy watery eyes & nose and associated sneezing.  She also is due f/u BMET for recently sl elevated creat 1.27-->> 1.62 and drop of GFR  39 -->> 29.   Medication Sig  . aspirin 81 MG tablet Take 81 mg by mouth daily.  [name] Kitchen atorvastatin (LIPITOR) 80 MG tablet TAKE 1 TABLET BY MOUTH ONCE DAIL\Y  . Biotin 10 MG CAPS Take 1 tablet by mouth daily.   . cholecalciferol (VITAMIN D) 1000 UNITS tablet Take 1,000 Units by mouth daily.  . citalopram (CELEXA) 20 MG tablet TAKE 1 TABLET BY MOUTH ONCE DAILY FOR MOOD  . docusate sodium (COLACE) 100 MG capsule Take 1 capsule (100 mg total) by mouth 3 (three) times daily as needed.  . furosemide (LASIX) 40 MG tablet take 1 tablet by mouth once daily  . glucose blood test strip Check blood sugar 1 time daily-DX-E11.22.  . lansoprazole (PREVACID) 15 MG capsule Take 15 mg by mouth daily at 12 noon.  [name] Kitchen levothyroxine (SYNTHROID, LEVOTHROID) 100 MCG tablet take 1 tablet by mouth every morning  . losartan (COZAAR) 100 MG tablet take 1 tablet by mouth once daily  . Magnesium 400 MG CAPS Take 1 capsule by mouth daily.   [name] Kitchen mexiletine (MEXITIL) 150 MG capsule take 1 capsule by mouth three times a day  . minoxidil (LONITEN) 10 MG tablet Take 1/2 to 1 tablet daily for BP  . Multiple Vitamins-Minerals (MULTIVITAMIN WITH MINERALS) tablet Take 1 tablet by mouth daily.  . ondansetron (ZOFRAN) 4 MG tablet Take 1 tablet (4 mg total) by mouth every 8 (eight) hours as needed for nausea or vomiting.  [name] Kitchen PRODIGY LANCETS 28G MISC Check blood sugar 1 time daily-DX-E11.22.  . propranolol ER (INDERAL LA) 60 MG 24 hr capsule Take 1 capsule (60 mg total) by mouth daily.  . verapamil (VERELAN PM) 120 MG 24 hr capsule take 1  capsule by mouth twice a day with food  . trazodone (DESYREL) 300 MG tablet TAKE 1 TABLET BY MOUTH AT BEDTIME  . atenolol (TENORMIN) 100 MG tablet take 1 tablet by mouth once daily for blood pressure (Patient not taking: Reported on 04/27/2018)   No facility-administered medications prior to visit.    Allergies  Allergen Reactions  . Codeine Rash    rash  . Vasotec [Enalapril] Other (See Comments)    cough  . Augmentin [Amoxicillin-Pot Clavulanate] Other (See Comments)    vaginitis  . Hytrin [Terazosin] Other (See Comments)    incontinence   . Oxycodone Rash    rash  . Procardia [Nifedipine] Other (See Comments)    edema   Past Medical History:  Diagnosis Date  . Anemia   . Anxiety   . Arthritis   . Constipation   . Depression   . GERD (gastroesophageal reflux disease)   . History of blood transfusion   . History of toe surgery left  . Hyperlipidemia   . Hypertension   . Hypothyroidism   . Insomnia   . Radiculopathy 02/06/2016  . Thyroid disease   . Type II or unspecified type diabetes mellitus with renal manifestations, not stated as uncontrolled(250.40)    stage II, does not take medication  .  Wears glasses    Past Surgical History:  Procedure Laterality Date  . ABDOMINAL HYSTERECTOMY    . APPENDECTOMY    . BACK SURGERY  02/06/2016  . BREAST BIOPSY     core biopsy 1995  . CARPAL TUNNEL RELEASE Left 07/08/2016   Procedure: CARPAL TUNNEL RELEASE;  Surgeon: Jones BroomJustin Chandler, MD;  Location: [facility];  Service: Orthopedics;  Laterality: Left;  Left carpal tunnel release  . CEREBRAL ANEURYSM REPAIR  09/2007   coil ablation  . CHOLECYSTECTOMY  2012   lap choli  . COLONOSCOPY    . GANGLION CYST EXCISION Left 2008   lt ring finger  . HAMMER TOE SURGERY  2008   osteotomy=left foot  . JOINT REPLACEMENT Left 2012   left total knee  . SHOULDER ARTHROSCOPY WITH ROTATOR CUFF REPAIR AND SUBACROMIAL DECOMPRESSION Right 01/29/2015   Procedure: RIGHT SHOULDER  ARTHROSCOPY WITH DEBRIDEMENT ROTATOR CUFF TEAR, SUBACROMIAL DECOMPRESSION, DISTAL CLAVICAL RESECTION;  Surgeon: Jones BroomJustin Chandler, MD;  Location: [facility];  Service: Orthopedics;  Laterality: Right;  Right shoulder arthroscopy debridement rotatoro cuff tear, subacromial decompression, distal clavical resection  . TONSILLECTOMY    . TOTAL KNEE ARTHROPLASTY Right 2005   right  . TOTAL KNEE REVISION Right 2007   right   Review of Systems     10 point systems review negative except as above.    Objective:   Physical Exam  BP (!) 94/56   Pulse 72   Temp (!) 97.5 F (36.4 C)   Resp 16   Ht 5\' 3"  (1.6 m)   Wt 140 lb 3.2 oz (63.6 kg)   BMI 24.84 kg/m   In No Distress. Dry cough. No rash, cyanosis, icterus or clubbing.   HEENT - Conjunctiva sl injected w/o exudates.  Eac's patent. TM's Nl. EOM's full. No sinus tenderness.  PERRLA. NasoOroPharynx clear. Neck - supple. Nl Thyroid. Carotids 2+ & No bruits, nodes, JVD Chest - Clear equal BS w/o rales, rhonchi, wheezes. Cor - Nl HS. RRR w/o sig MGR. PP 1(+). No edema. MS- FROM w/o deformities. Muscle power, tone and bulk Nl. Gait Nl. Neuro -  Nl w/o focal abnormalities. Skin - exposed clear w/o rash cyanosis or icterus.    Assessment & Plan:   1. Essential hypertension  - BASIC METABOLIC PANEL WITH GFR  =- discussed exercise , dash diet 2. Allergic rhinitis due to pollen, unspecified seasonality  - predniSONE (DELTASONE) 20 MG tablet; 1 tab 3 x day for 3 days, then 1 tab 2 x day for 3 days, then 1 tab 1 x day for 5 days  Dispense: 20 tablet  - prednisoLONE acetate (PRED FORTE) 1 % ophthalmic suspension; Place 1 drop into both eyes 4 (four) times daily.  Dispense: 5 mL; Refill: 0  3. Allergic conjunctivitis of both eyes  - predniSONE (DELTASONE) 20 MG tablet; 1 tab 3 x day for 3 days, then 1 tab 2 x day for 3 days, then 1 tab 1 x day for 5 days  Dispense: 20 tablet  - prednisoLONE acetate (PRED FORTE) 1 % ophthalmic  suspension; Place 1 drop into both eyes 4 (four) times daily.  Dispense: 5 mL; Refill: 0  4. Medication management  - BASIC METABOLIC PANEL WITH GFR  - discussed meds & SE's.

## 2018-05-03 ENCOUNTER — Telehealth: Payer: Self-pay | Admitting: *Deleted

## 2018-05-03 ENCOUNTER — Other Ambulatory Visit: Payer: Self-pay | Admitting: *Deleted

## 2018-05-03 MED ORDER — HYDROXYZINE HCL 10 MG PO TABS: ORAL_TABLET | ORAL | 0 refills | Status: DC

## 2018-05-03 NOTE — Telephone Encounter (Signed)
Patient called and states she feels nervous and shaky inside. She states she has checked her blood sugar and it is OK. She states she had to have her dog put to sleep last week and is having trouble sleeping, even after taking Trazodone.Per Dr Oneta RackMckeown, an RX for Atarax 25 mg 1 tablet 3 times a day has been sent to her pharmacy.  The patient is aware.

## 2018-05-25 ENCOUNTER — Ambulatory Visit
Admission: RE | Admit: 2018-05-25 | Discharge: 2018-05-25 | Disposition: A | Discharge status: Home / Self Care | Payer: Medicare Other | Source: Ambulatory Visit | Attending: Adult Health | Admitting: Adult Health

## 2018-05-25 ENCOUNTER — Ambulatory Visit (INDEPENDENT_AMBULATORY_CARE_PROVIDER_SITE_OTHER): Payer: Medicare Other | Admitting: Internal Medicine

## 2018-05-25 ENCOUNTER — Encounter: Payer: Self-pay | Admitting: Adult Health

## 2018-05-25 ENCOUNTER — Encounter: Payer: Self-pay | Admitting: Internal Medicine

## 2018-05-25 VITALS — BP 114/62 | HR 76 | Temp 97.2°F | Resp 16 | Ht 63.0 in | Wt 136.8 lb

## 2018-05-25 DIAGNOSIS — Z1231 Encounter for screening mammogram for malignant neoplasm of breast: Secondary | ICD-10-CM | POA: Diagnosis not present

## 2018-05-25 DIAGNOSIS — E559 Vitamin D deficiency, unspecified: Secondary | ICD-10-CM | POA: Diagnosis not present

## 2018-05-25 DIAGNOSIS — I951 Orthostatic hypotension: Secondary | ICD-10-CM | POA: Diagnosis not present

## 2018-05-25 DIAGNOSIS — E039 Hypothyroidism, unspecified: Secondary | ICD-10-CM | POA: Diagnosis not present

## 2018-05-25 DIAGNOSIS — Z79899 Other long term (current) drug therapy: Secondary | ICD-10-CM | POA: Diagnosis not present

## 2018-05-25 DIAGNOSIS — D518 Other vitamin B12 deficiency anemias: Secondary | ICD-10-CM | POA: Diagnosis not present

## 2018-05-25 DIAGNOSIS — M8580 Other specified disorders of bone density and structure, unspecified site: Secondary | ICD-10-CM | POA: Insufficient documentation

## 2018-05-25 DIAGNOSIS — M85851 Other specified disorders of bone density and structure, right thigh: Secondary | ICD-10-CM | POA: Diagnosis not present

## 2018-05-25 DIAGNOSIS — E2839 Other primary ovarian failure: Secondary | ICD-10-CM

## 2018-05-25 DIAGNOSIS — Z780 Asymptomatic menopausal state: Secondary | ICD-10-CM | POA: Diagnosis not present

## 2018-05-25 DIAGNOSIS — R5383 Other fatigue: Secondary | ICD-10-CM

## 2018-05-25 DIAGNOSIS — D509 Iron deficiency anemia, unspecified: Secondary | ICD-10-CM

## 2018-05-25 NOTE — Progress Notes (Signed)
Patient ID: [name], female   DOB: 1933/05/15, 84 y.o.   MRN: [id number]      [facility] very [facility] 84 [name]  follow up with hx/o HTN, HLD, Pre-Diabetes and Vitamin D Deficiency.      Patient is treated for HTN & BP has been controlled at home. Patient presents today urgently with c/o dizziness, fatigue and "black stools". Denies use of Pepto Bismol.  Today's BP is at goal -114/62, but postural BP's are mildly (+)positive. Patient has had no complaints of any cardiac type chest pain, palpitations, dyspnea / orthopnea / PND, dizziness, claudication, or dependent edema.  Current Outpatient Medications on File Prior to Visit  Medication Sig  . aspirin 81 MG tablet Take 81 mg by mouth daily.  [name] Kitchen. atorvastatin (LIPITOR) 80 MG tablet TAKE 1 TABLET BY MOUTH ONCE DAIL\Y  . Biotin 10 MG CAPS Take 1 tablet by mouth daily.   . cholecalciferol (VITAMIN D) 1000 UNITS tablet Take 1,000 Units by mouth daily.  . citalopram (CELEXA) 20 MG tablet TAKE 1 TABLET BY MOUTH ONCE DAILY FOR MOOD  . docusate sodium (COLACE) 100 MG capsule Take 1 capsule (100 mg total) by mouth 3 (three) times daily as needed.  . furosemide (LASIX) 40 MG tablet take 1 tablet by mouth once daily  . glucose blood test strip Check blood sugar 1 time daily-DX-E11.22.  . hydrOXYzine (ATARAX/VISTARIL) 10 MG tablet Take 1 tablet 3 times a day for nerves.  . lansoprazole (PREVACID) 15 MG capsule Take 15 mg by mouth daily at 12 noon.  [name] Kitchen. levothyroxine (SYNTHROID, LEVOTHROID) 100 MCG tablet take 1 tablet by mouth every morning  . losartan (COZAAR) 100 MG tablet take 1 tablet by mouth once daily  . Magnesium 400 MG CAPS Take 1 capsule by mouth daily.   [name] Kitchen. mexiletine (MEXITIL) 150 MG capsule take 1 capsule by mouth three times a day  . minoxidil (LONITEN) 10 MG tablet Take 1/2 to 1 tablet daily for BP  . Multiple Vitamins-Minerals (MULTIVITAMIN WITH MINERALS) tablet Take 1 tablet by mouth daily.  . ondansetron (ZOFRAN) 4 MG tablet Take  1 tablet (4 mg total) by mouth every 8 (eight) hours as needed for nausea or vomiting.  . prednisoLONE acetate (PRED FORTE) 1 % ophthalmic suspension Place 1 drop into both eyes 4 (four) times daily.  [name] Kitchen. PRODIGY LANCETS 28G MISC Check blood sugar 1 time daily-DX-E11.22.  . propranolol ER (INDERAL LA) 60 MG 24 hr capsule Take 1 capsule (60 mg total) by mouth daily.  . trazodone (DESYREL) 300 MG tablet TAKE 1 TABLET BY MOUTH AT BEDTIME  . verapamil (VERELAN PM) 120 MG 24 hr capsule take 1 capsule by mouth twice a day with food   No current facility-administered medications on file prior to visit.    Allergies  Allergen Reactions  . Codeine Rash    rash  . Vasotec [Enalapril] Other (See Comments)    cough  . Augmentin [Amoxicillin-Pot Clavulanate] Other (See Comments)    vaginitis  . Hytrin [Terazosin] Other (See Comments)    incontinence   . Oxycodone Rash    rash  . Procardia [Nifedipine] Other (See Comments)    edema   PMHx:   Past Medical History:  Diagnosis Date  . Anemia   . Anxiety   . Arthritis   . Constipation   . Depression   . GERD (gastroesophageal reflux disease)   . History of blood transfusion   . History of toe surgery  left  . Hyperlipidemia   . Hypertension   . Hypothyroidism   . Insomnia   . Radiculopathy 02/06/2016  . Thyroid disease   . Type II or unspecified type diabetes mellitus with renal manifestations, not stated as uncontrolled(250.40)    stage II, does not take medication  . Wears glasses    Immunization History  Administered Date(s) Administered  . Influenza, High Dose Seasonal PF 09/05/2014, 10/02/2015, 07/24/2016, 09/16/2017  . Influenza-Unspecified 09/10/2013  . Pneumococcal Conjugate-13 07/24/2016  . Pneumococcal-Unspecified 11/26/2012  . Td 11/26/2012  . Tdap 08/24/2014   Past Surgical History:  Procedure Laterality Date  . ABDOMINAL HYSTERECTOMY    . APPENDECTOMY    . BACK SURGERY  02/06/2016  . BREAST BIOPSY     core biopsy  1995  . CARPAL TUNNEL RELEASE Left 07/08/2016   Procedure: CARPAL TUNNEL RELEASE;  Surgeon: Jones Broom, MD;  Location: [facility];  Service: Orthopedics;  Laterality: Left;  Left carpal tunnel release  . CEREBRAL ANEURYSM REPAIR  09/2007   coil ablation  . CHOLECYSTECTOMY  2012   lap choli  . COLONOSCOPY    . GANGLION CYST EXCISION Left 2008   lt ring finger  . HAMMER TOE SURGERY  2008   osteotomy=left foot  . JOINT REPLACEMENT Left 2012   left total knee  . SHOULDER ARTHROSCOPY WITH ROTATOR CUFF REPAIR AND SUBACROMIAL DECOMPRESSION Right 01/29/2015   Procedure: RIGHT SHOULDER ARTHROSCOPY WITH DEBRIDEMENT ROTATOR CUFF TEAR, SUBACROMIAL DECOMPRESSION, DISTAL CLAVICAL RESECTION;  Surgeon: Jones Broom, MD;  Location: [facility];  Service: Orthopedics;  Laterality: Right;  Right shoulder arthroscopy debridement rotatoro cuff tear, subacromial decompression, distal clavical resection  . TONSILLECTOMY    . TOTAL KNEE ARTHROPLASTY Right 2005   right  . TOTAL KNEE REVISION Right 2007   right   FHx:    Reviewed / unchanged  SHx:    Reviewed / unchanged   Systems Review:  Constitutional: Denies fever, chills, wt changes, headaches, insomnia, fatigue, night sweats, change in appetite. Eyes: Denies redness, blurred vision, diplopia, discharge, itchy, watery eyes.  ENT: Denies discharge, congestion, post nasal drip, epistaxis, sore throat, earache, hearing loss, dental pain, tinnitus, vertigo, sinus pain, snoring.  CV: Denies chest pain, palpitations, irregular heartbeat, syncope, dyspnea, diaphoresis, orthopnea, PND, claudication or edema. Respiratory: denies cough, dyspnea, DOE, pleurisy, hoarseness, laryngitis, wheezing.  Gastrointestinal: Denies dysphagia, odynophagia, heartburn, reflux, water brash, abdominal pain or cramps, nausea, vomiting, bloating, diarrhea, constipation, hematemesis, melena, hematochezia  or hemorrhoids. Genitourinary: Denies  dysuria, frequency, urgency, nocturia, hesitancy, discharge, hematuria or flank pain. Musculoskeletal: Denies arthralgias, myalgias, stiffness, jt. swelling, pain, limping or strain/sprain.  Skin: Denies pruritus, rash, hives, warts, acne, eczema or change in skin lesion(s). Neuro: No weakness, tremor, incoordination, spasms, paresthesia or pain. Psychiatric: Denies confusion, memory loss or sensory loss. Endo: Denies change in weight, skin or hair change.  Heme/Lymph: No excessive bleeding, bruising or enlarged lymph nodes.  Physical Exam  BP 114/62   Pulse 76   Temp (!) 97.2 F (36.2 C)   Resp 16   Ht 5\' 3"  (1.6 m)   Wt 136 lb 12.8 oz (62.1 kg)   BMI 24.23 kg/m   Postural Sitting BP 121/67   P 67   & Standing BP dropped to 109/64    P 70   Appears  well nourished, well groomed  and in no distress.  Eyes: PERRLA, EOMs, conjunctiva no swelling or erythema. Sinuses: No frontal/maxillary tenderness ENT/Mouth: EAC's clear, TM's nl w/o  erythema, bulging. Nares clear w/o erythema, swelling, exudates. Oropharynx clear without erythema or exudates. Oral hygiene is good. Tongue normal, non obstructing. Hearing intact.  Neck: Supple. Thyroid not palpable. Car 2+/2+ without bruits, nodes or JVD. Chest: Respirations nl with BS clear & equal w/o rales, rhonchi, wheezing or stridor.  Cor: Heart sounds normal w/ regular rate and rhythm without sig. murmurs, gallops, clicks or rubs. Peripheral pulses normal and equal  without edema.  Abdomen: Soft & bowel sounds normal. Non-tender w/o guarding, rebound, hernias, masses or organomegaly.  Lymphatics: Unremarkable.  Musculoskeletal: Full ROM all peripheral extremities, joint stability, 5/5 strength and normal gait.  Skin: Warm, dry without exposed rashes, lesions or ecchymosis apparent.  Neuro: Cranial nerves intact, reflexes equal bilaterally. Sensory-motor testing grossly intact. Tendon reflexes grossly intact.  Pysch: Alert & oriented x 3.   Insight and judgement nl & appropriate..  Assessment and Plan:  1. Postural hypotension, mild  - monitor blood pressure at home.  - Continue DASH diet.  Reminder to go to the ER if any CP,  SOB, nausea, dizziness, severe HA, changes vision/speech.  - Advised hold Lasix to use prn only. Recommended decrease Losartan 100 mg to 1/2 tablet and decrease her Minoxidil 10 mg to 1/2 tablet daily   - CBC with Differential/Platelet - COMPLETE METABOLIC PANEL WITH GFR  2. Other vitamin B12 deficiency anemia  - Vitamin B12  3. Iron deficiency anemia  - Iron,Total/Total Iron Binding Cap  4. Vitamin D deficiency  - VITAMIN D 25 Hydroxyl  5. Hypothyroidism  - TSH  6. Fatigue  - CBC with Differential/Platelet - Magnesium - TSH - VITAMIN D 25 Hydroxyl - Vitamin B12 - Iron,Total/Total Iron Binding Cap  7. Medication management  - CBC with Differential/Platelet - COMPLETE METABOLIC PANEL WITH GFR - Magnesium - TSH - VITAMIN D 25 Hydroxyl - Vitamin B12 - Iron,Total/Total Iron Binding Cap        Discussed  regular exercise, BP monitoring, weight control to achieve/maintain BMI less than 25 and discussed med and SE's. Recommended labs to assess and monitor clinical status with further disposition pending results of labs. Over 30 minutes of exam, counseling, chart review was performed.

## 2018-05-26 LAB — COMPLETE METABOLIC PANEL WITH GFR
AG Ratio: 1.8 (calc) (ref 1.0–2.5)
ALBUMIN MSPROF: 4.4 g/dL (ref 3.6–5.1)
ALT: 10 U/L (ref 6–29)
AST: 18 U/L (ref 10–35)
Alkaline phosphatase (APISO): 66 U/L (ref 33–130)
BILIRUBIN TOTAL: 0.3 mg/dL (ref 0.2–1.2)
BUN/Creatinine Ratio: 20 (calc) (ref 6–22)
BUN: 24 mg/dL (ref 7–25)
CALCIUM: 10.1 mg/dL (ref 8.6–10.4)
CO2: 33 mmol/L — ABNORMAL HIGH (ref 20–32)
CREATININE: 1.21 mg/dL — AB (ref 0.60–0.88)
Chloride: 101 mmol/L (ref 98–110)
GFR, EST AFRICAN AMERICAN: 48 mL/min/{1.73_m2} — AB (ref >=60)
GFR, EST NON AFRICAN AMERICAN: 41 mL/min/{1.73_m2} — AB (ref >=60)
GLUCOSE: 150 mg/dL — AB (ref 65–99)
Globulin: 2.5 g/dL (calc) (ref 1.9–3.7)
Potassium: 3.7 mmol/L (ref 3.5–5.3)
Sodium: 142 mmol/L (ref 135–146)
TOTAL PROTEIN: 6.9 g/dL (ref 6.1–8.1)

## 2018-05-26 LAB — CBC WITH DIFFERENTIAL/PLATELET
BASOS PCT: 0.8 %
Basophils Absolute: 48 cells/uL (ref 0–200)
EOS PCT: 4.7 %
Eosinophils Absolute: 282 cells/uL (ref 15–500)
HCT: 34.7 % — ABNORMAL LOW (ref 35.0–45.0)
Hemoglobin: 11.5 g/dL — ABNORMAL LOW (ref 11.7–15.5)
Lymphs Abs: 1500 cells/uL (ref 850–3900)
MCH: 30.5 pg (ref 27.0–33.0)
MCHC: 33.1 g/dL (ref 32.0–36.0)
MCV: 92 fL (ref 80.0–100.0)
MONOS PCT: 8.6 %
MPV: 9.8 fL (ref 7.5–12.5)
Neutro Abs: 3654 cells/uL (ref 1500–7800)
Neutrophils Relative %: 60.9 %
PLATELETS: 331 10*3/uL (ref 140–400)
RBC: 3.77 10*6/uL — ABNORMAL LOW (ref 3.80–5.10)
RDW: 12.5 % (ref 11.0–15.0)
TOTAL LYMPHOCYTE: 25 %
WBC mixed population: 516 cells/uL (ref 200–950)
WBC: 6 10*3/uL (ref 3.8–10.8)

## 2018-05-26 LAB — IRON, TOTAL/TOTAL IRON BINDING CAP
%SAT: 19 % (calc) (ref 16–45)
IRON: 53 ug/dL (ref 45–160)
TIBC: 276 ug/dL (ref 250–450)

## 2018-05-26 LAB — VITAMIN B12: Vitamin B-12: 475 pg/mL (ref 200–1100)

## 2018-05-26 LAB — VITAMIN D 25 HYDROXY (VIT D DEFICIENCY, FRACTURES): VIT D 25 HYDROXY: 44 ng/mL (ref 30–100)

## 2018-05-26 LAB — TSH: TSH: 1.52 m[IU]/L (ref 0.40–4.50)

## 2018-05-26 LAB — MAGNESIUM: Magnesium: 2 mg/dL (ref 1.5–2.5)

## 2018-06-04 ENCOUNTER — Ambulatory Visit (INDEPENDENT_AMBULATORY_CARE_PROVIDER_SITE_OTHER): Payer: Medicare Other | Admitting: Internal Medicine

## 2018-06-04 VITALS — BP 124/82 | HR 60 | Temp 97.3°F | Resp 16 | Ht 63.0 in | Wt 143.2 lb

## 2018-06-04 DIAGNOSIS — I951 Orthostatic hypotension: Secondary | ICD-10-CM

## 2018-06-04 DIAGNOSIS — R5383 Other fatigue: Secondary | ICD-10-CM | POA: Diagnosis not present

## 2018-06-04 NOTE — Progress Notes (Signed)
Subjective:    Patient ID: [name], female    DOB: [birth year], 84 y.o.   MRN: [id number]  HPI  This very nice 84 yo WWF for 10 day f/u after tapering her Minoxidil & Losartan to 1/2 dose and changing her Lasix to PRN use only. He reports she feels much better and has no more postural dizziness and fatigue. She denies any HT or CV sx's.   Medication Sig  . aspirin 81 MG tablet Take 81 mg by mouth daily.  [name] Kitchen atorvastatin (LIPITOR) 80 MG tablet TAKE 1 TABLET BY MOUTH ONCE DAIL\Y  . Biotin 10 MG CAPS Take 1 tablet by mouth daily.   [name] Kitchen VITAMIN D 1000 UNITS Take 1,000 Units by mouth daily.  . citalopram (CELEXA) 20 MG tablet TAKE 1 TABLET BY MOUTH ONCE DAILY FOR MOOD  . COLACE 100 MG capsule Take 1 cap  3  times daily as needed.  . furosemide (LASIX) 40 MG tablet take 1 tablet by mouth once daily  . Lansoprazole 15 MG capsule Take 15 mg by mouth daily at 12 noon.  [name] Kitchen levothyroxine   100 MCG tablet take 1 tablet by mouth every morning  . losartan 100 MG tablet take 1 tablet by mouth once daily  . mexiletine  150 MG cap take 1 capsule by mouth three times a day  . minoxidil  10 MG tablet Take 1/2 to 1 tablet daily for BP  . Multiple Vitamins-Minerals   Take 1 tablet by mouth daily.  . ondansetron  4 MG tablet Take 1 tab  every 8 hrs as needed for nausea    . propranolol ER  60 MG 24 hr cap Take 1 capsule (60 mg total) by mouth daily.  . trazodone 300 MG tablet TAKE 1 TABLET BY MOUTH AT BEDTIME  . verapamil  120 MG 24 hr cap take 1 capsule by mouth twice a day with food  . hydrOXYzine  10 MG tablet Take 1 tablet 3 times a day for nerves.  [name] Kitchen PRED FORTE 1 % ophth susp Place 1 drop into both eyes 4 (four) times daily.   Allergies  Allergen Reactions  . Codeine Rash    rash  . Vasotec [Enalapril] Other (See Comments)    cough  . Augmentin [Amoxicillin-Pot Clavulanate] Other (See Comments)    vaginitis  . Hytrin [Terazosin] Other (See Comments)    incontinence   . Oxycodone Rash    rash   . Procardia [Nifedipine] Other (See Comments)    edema   Past Medical History:  Diagnosis Date  . Anemia   . Anxiety   . Arthritis   . Constipation   . Depression   . GERD (gastroesophageal reflux disease)   . History of blood transfusion   . History of toe surgery left  . Hyperlipidemia   . Hypertension   . Hypothyroidism   . Insomnia   . Radiculopathy 02/06/2016  . Thyroid disease   . Type II or unspecified type diabetes mellitus with renal manifestations, not stated as uncontrolled(250.40)    stage II, does not take medication  . Wears glasses    Past Surgical History:  Procedure Laterality Date  . ABDOMINAL HYSTERECTOMY    . APPENDECTOMY    . BACK SURGERY  02/06/2016  . BREAST BIOPSY     core biopsy 1995  . CARPAL TUNNEL RELEASE Left 07/08/2016   Procedure: CARPAL TUNNEL RELEASE;  Surgeon: Jones Broom, MD;  Location: [facility];  Service:  Orthopedics;  Laterality: Left;  Left carpal tunnel release  . CEREBRAL ANEURYSM REPAIR  09/2007   coil ablation  . CHOLECYSTECTOMY  2012   lap choli  . COLONOSCOPY    . GANGLION CYST EXCISION Left 2008   lt ring finger  . HAMMER TOE SURGERY  2008   osteotomy=left foot  . JOINT REPLACEMENT Left 2012   left total knee  . SHOULDER ARTHROSCOPY WITH ROTATOR CUFF REPAIR AND SUBACROMIAL DECOMPRESSION Right 01/29/2015   Procedure: RIGHT SHOULDER ARTHROSCOPY WITH DEBRIDEMENT ROTATOR CUFF TEAR, SUBACROMIAL DECOMPRESSION, DISTAL CLAVICAL RESECTION;  Surgeon: Jones BroomJustin Chandler, MD;  Location: [facility];  Service: Orthopedics;  Laterality: Right;  Right shoulder arthroscopy debridement rotatoro cuff tear, subacromial decompression, distal clavical resection  . TONSILLECTOMY    . TOTAL KNEE ARTHROPLASTY Right 2005   right  . TOTAL KNEE REVISION Right 2007   right   Review of Systems   10 point systems review negative except as above.    Objective:   Physical Exam  BP 124/82   P 60   T 97.3 F   R 16    Ht 5\' 3"    Wt 143 lb    BMI 25.37   Postural sitting BP   146/67  P 60    &    Standing BP 131/73    P 60  HEENT - WNL. Neck - supple.  Chest - Clear equal BS. Cor - Nl HS. RRR w/o sig MGR. PP 1(+). No edema. MS- FROM w/o deformities.  Gait Nl. Neuro -  Nl w/o focal abnormalities.    Assessment & Plan:   1. Postural hypotension  - advised to continue meds same & monitor home BP's both sitting & standing  2. Fatigue

## 2018-06-05 ENCOUNTER — Encounter: Payer: Self-pay | Admitting: Internal Medicine

## 2018-06-30 ENCOUNTER — Other Ambulatory Visit: Payer: Self-pay | Admitting: Internal Medicine

## 2018-06-30 ENCOUNTER — Other Ambulatory Visit: Payer: Self-pay | Admitting: Adult Health

## 2018-07-01 ENCOUNTER — Encounter: Payer: Self-pay | Admitting: Internal Medicine

## 2018-07-01 ENCOUNTER — Ambulatory Visit (INDEPENDENT_AMBULATORY_CARE_PROVIDER_SITE_OTHER): Payer: Medicare Other | Admitting: Internal Medicine

## 2018-07-01 VITALS — BP 122/64 | HR 68 | Temp 97.7°F | Resp 16 | Ht 62.0 in | Wt 139.8 lb

## 2018-07-01 DIAGNOSIS — E782 Mixed hyperlipidemia: Secondary | ICD-10-CM | POA: Diagnosis not present

## 2018-07-01 DIAGNOSIS — N183 Chronic kidney disease, stage 3 unspecified: Secondary | ICD-10-CM

## 2018-07-01 DIAGNOSIS — Z1211 Encounter for screening for malignant neoplasm of colon: Secondary | ICD-10-CM

## 2018-07-01 DIAGNOSIS — I10 Essential (primary) hypertension: Secondary | ICD-10-CM | POA: Diagnosis not present

## 2018-07-01 DIAGNOSIS — Z79899 Other long term (current) drug therapy: Secondary | ICD-10-CM | POA: Diagnosis not present

## 2018-07-01 DIAGNOSIS — R7303 Prediabetes: Secondary | ICD-10-CM

## 2018-07-01 DIAGNOSIS — E1122 Type 2 diabetes mellitus with diabetic chronic kidney disease: Secondary | ICD-10-CM | POA: Diagnosis not present

## 2018-07-01 DIAGNOSIS — E039 Hypothyroidism, unspecified: Secondary | ICD-10-CM

## 2018-07-01 DIAGNOSIS — K219 Gastro-esophageal reflux disease without esophagitis: Secondary | ICD-10-CM

## 2018-07-01 DIAGNOSIS — E559 Vitamin D deficiency, unspecified: Secondary | ICD-10-CM

## 2018-07-01 DIAGNOSIS — Z136 Encounter for screening for cardiovascular disorders: Secondary | ICD-10-CM | POA: Diagnosis not present

## 2018-07-01 DIAGNOSIS — Z1212 Encounter for screening for malignant neoplasm of rectum: Secondary | ICD-10-CM

## 2018-07-01 NOTE — Progress Notes (Signed)
[name] [name], [name]     [name]. [name], [name] Judd Gaudier, DNP [facility] [address] [place], [place]. [address] Telephone [phone] Telefax [phone]  Comprehensive Evaluation &  Examination     This very nice 84 y.o. [facility] presents for a  comprehensive evaluation and management of multiple medical co-morbidities.  Patient has been followed for HTN, HLD, T2_NIDDM and Vitamin D Deficiency. Patient also has GERD controlled w/diet & her meds      HTN predates circa 28. Patient's BP has been controlled at home and patient denies any cardiac symptoms as chest pain, palpitations, shortness of breath, dizziness or ankle swelling. Today's BP is at goal - 122/64.      Patient's hyperlipidemia is controlled with diet and medications. Patient denies myalgias or other medication SE's. Last lipids were at goal: Lab Results  Component Value Date   CHOL 138 07/01/2018   HDL 52 07/01/2018   LDLCALC 64 07/01/2018   TRIG 135 07/01/2018   CHOLHDL 2.7 07/01/2018      Patient has hx/o T2_NIDDM (A1c 6.5%/2008, then A1c 7.1%/2016 & A1c 6.7%/2017) and CKD3 (GFR 41) and patient denies reactive hypoglycemic symptoms, visual blurring, diabetic polys  or paresthesias. She is attempting control with diet and last A1c was still not at goal: Lab Results  Component Value Date   HGBA1C 6.5 (H) 07/01/2018      She was started on Thyroid Re[placement in 2012.      Finally, patient has history of Vitamin D Deficiency ("24" in 2009) and last Vitamin D was at goal: Lab Results  Component Value Date   VD25OH 64 07/01/2018   Current Outpatient Medications on File Prior to Visit  Medication Sig  . aspirin 81 MG tablet Take 81 mg by mouth daily.  [name] Kitchen atorvastatin (LIPITOR) 80 MG tablet TAKE 1 TABLET BY MOUTH ONCE DAIL\Y  . Biotin 10 MG CAPS Take 1 tablet by mouth daily.   . cholecalciferol (VITAMIN D) 1000 UNITS tablet  Take 1,000 Units by mouth daily.  . citalopram (CELEXA) 20 MG tablet TAKE 1 TABLET BY MOUTH ONCE DAILY FOR MOOD  . docusate sodium (COLACE) 100 MG capsule Take 1 capsule (100 mg total) by mouth 3 (three) times daily as needed.  . furosemide (LASIX) 40 MG tablet take 1 tablet by mouth once daily  . glucose blood test strip Check blood sugar 1 time daily-DX-E11.22.  . lansoprazole (PREVACID) 15 MG capsule Take 15 mg by mouth daily at 12 noon.  [name] Kitchen levothyroxine (SYNTHROID, LEVOTHROID) 100 MCG tablet take 1 tablet by mouth every morning  . losartan (COZAAR) 100 MG tablet TAKE 1 TABLET BY MOUTH ONCE DAILY  . mexiletine (MEXITIL) 150 MG capsule TAKE 1 CAPSULE BY MOUTH THREE TIMES DAILY  . minoxidil (LONITEN) 10 MG tablet Take 1/2 to 1 tablet daily for BP  . Multiple Vitamins-Minerals (MULTIVITAMIN WITH MINERALS) tablet Take 1 tablet by mouth daily.  . ondansetron (ZOFRAN) 4 MG tablet Take 1 tablet (4 mg total) by mouth every 8 (eight) hours as needed for nausea or vomiting.  [name] Kitchen PRODIGY LANCETS 28G MISC Check blood sugar 1 time daily-DX-E11.22.  . propranolol ER (INDERAL LA) 60 MG 24 hr capsule Take 1 capsule (60 mg total) by mouth daily.  . trazodone (DESYREL) 300 MG tablet TAKE 1 TABLET BY MOUTH AT BEDTIME  . verapamil (VERELAN PM) 120 MG 24 hr capsule take 1 capsule by mouth twice a day with food  No current facility-administered medications on file prior to visit.    Allergies  Allergen Reactions  . Codeine Rash    rash  . Vasotec [Enalapril] Other (See Comments)    cough  . Augmentin [Amoxicillin-Pot Clavulanate] Other (See Comments)    vaginitis  . Hytrin [Terazosin] Other (See Comments)    incontinence   . Oxycodone Rash    rash  . Procardia [Nifedipine] Other (See Comments)    edema   Past Medical History:  Diagnosis Date  . Anemia   . Anxiety   . Arthritis   . Constipation   . Depression   . GERD (gastroesophageal reflux disease)   . History of blood transfusion   .  History of toe surgery left  . Hyperlipidemia   . Hypertension   . Hypothyroidism   . Insomnia   . Radiculopathy 02/06/2016  . Thyroid disease   . Type II or unspecified type diabetes mellitus with renal manifestations, not stated as uncontrolled(250.40)    stage II, does not take medication  . Wears glasses    Health Maintenance  Topic Date Due  . OPHTHALMOLOGY EXAM  02/18/2018  . INFLUENZA VACCINE  06/10/2018  . HEMOGLOBIN A1C  01/01/2019  . FOOT EXAM  07/02/2019  . TETANUS/TDAP  08/24/2024  . DEXA SCAN  Completed  . PNA vac Low Risk Adult  Completed   Immunization History  Administered Date(s) Administered  . Influenza, High Dose Seasonal PF 09/05/2014, 10/02/2015, 07/24/2016, 09/16/2017  . Influenza-Unspecified 09/10/2013  . Pneumococcal Conjugate-13 07/24/2016  . Pneumococcal-Unspecified 11/26/2012  . Td 11/26/2012  . Tdap 08/24/2014   Last Colon - 04/12/2010 - Dr Madilyn Fireman recc no f/u due to age  Last MGM  & Dexa BMD - 05/25/2018   Past Surgical History:  Procedure Laterality Date  . ABDOMINAL HYSTERECTOMY    . APPENDECTOMY    . BACK SURGERY  02/06/2016  . BREAST BIOPSY     core biopsy 1995  . CARPAL TUNNEL RELEASE Left 07/08/2016   Procedure: CARPAL TUNNEL RELEASE;  Surgeon: Jones Broom, MD;  Location: [facility];  Service: Orthopedics;  Laterality: Left;  Left carpal tunnel release  . CEREBRAL ANEURYSM REPAIR  09/2007   coil ablation  . CHOLECYSTECTOMY  2012   lap choli  . COLONOSCOPY    . GANGLION CYST EXCISION Left 2008   lt ring finger  . HAMMER TOE SURGERY  2008   osteotomy=left foot  . JOINT REPLACEMENT Left 2012   left total knee  . SHOULDER ARTHROSCOPY WITH ROTATOR CUFF REPAIR AND SUBACROMIAL DECOMPRESSION Right 01/29/2015   Procedure: RIGHT SHOULDER ARTHROSCOPY WITH DEBRIDEMENT ROTATOR CUFF TEAR, SUBACROMIAL DECOMPRESSION, DISTAL CLAVICAL RESECTION;  Surgeon: Jones Broom, MD;  Location: [facility];  Service:  Orthopedics;  Laterality: Right;  Right shoulder arthroscopy debridement rotatoro cuff tear, subacromial decompression, distal clavical resection  . TONSILLECTOMY    . TOTAL KNEE ARTHROPLASTY Right 2005   right  . TOTAL KNEE REVISION Right 2007   right   Family History  Problem Relation Age of Onset  . Cancer Mother        breast cancer  . Hypertension Mother   . Mental illness Mother        alz  . Breast cancer Mother   . Cancer Father        colon cancer  . Hypertension Father   . Cancer Maternal Aunt        breast cancer with mets   Social History  Tobacco Use  . Smoking status: Never Smoker  . Smokeless tobacco: Never Used  Substance Use Topics  . Alcohol use: Yes    Comment: rare  . Drug use: No    ROS Constitutional: Denies fever, chills, weight loss/gain, headaches, insomnia,  night sweats, and change in appetite. Does c/o fatigue. Eyes: Denies redness, blurred vision, diplopia, discharge, itchy, watery eyes.  ENT: Denies discharge, congestion, post nasal drip, epistaxis, sore throat, earache, hearing loss, dental pain, Tinnitus, Vertigo, Sinus pain, snoring.  Cardio: Denies chest pain, palpitations, irregular heartbeat, syncope, dyspnea, diaphoresis, orthopnea, PND, claudication, edema Respiratory: denies cough, dyspnea, DOE, pleurisy, hoarseness, laryngitis, wheezing.  Gastrointestinal: Denies dysphagia, heartburn, reflux, water brash, pain, cramps, nausea, vomiting, bloating, diarrhea, constipation, hematemesis, melena, hematochezia, jaundice, hemorrhoids Genitourinary: Denies dysuria, frequency, urgency, nocturia, hesitancy, discharge, hematuria, flank pain Breast: Breast lumps, nipple discharge, bleeding.  Musculoskeletal: Denies arthralgia, myalgia, stiffness, Jt. Swelling, pain, limp, and strain/sprain. Denies falls. Skin: Denies puritis, rash, hives, warts, acne, eczema, changing in skin lesion Neuro: No weakness, tremor, incoordination, spasms, paresthesia,  pain Psychiatric: Denies confusion, memory loss, sensory loss. Denies Depression. Endocrine: Denies change in weight, skin, hair change, nocturia, and paresthesia, diabetic polys, visual blurring, hyper / hypo glycemic episodes.  Heme/Lymph: No excessive bleeding, bruising, enlarged lymph nodes.  Physical Exam  BP 122/64   Pulse 68   Temp 97.7 F (36.5 C)   Resp 16   Ht 5\' 2"  (1.575 m)   Wt 139 lb 12.8 oz (63.4 kg)   BMI 25.57 kg/m   General Appearance: Well nourished, well groomed and in no apparent distress.  Eyes: PERRLA, EOMs, conjunctiva no swelling or erythema, normal fundi and vessels. Sinuses: No frontal/maxillary tenderness ENT/Mouth: EACs patent / TMs  nl. Nares clear without erythema, swelling, mucoid exudates. Oral hygiene is good. No erythema, swelling, or exudate. Tongue normal, non-obstructing. Tonsils not swollen or erythematous. Hearing normal.  Neck: Supple, thyroid not palpable. No bruits, nodes or JVD. Respiratory: Respiratory effort normal.  BS equal and clear bilateral without rales, rhonci, wheezing or stridor. Cardio: Heart sounds are normal with regular rate and rhythm and no murmurs, rubs or gallops. Peripheral pulses are normal and equal bilaterally without edema. No aortic or femoral bruits. Chest: symmetric with normal excursions and percussion. Breasts: Symmetric, without lumps, nipple discharge, retractions, or fibrocystic changes.  Abdomen: Flat, soft with bowel sounds active. Nontender, no guarding, rebound, hernias, masses, or organomegaly.  Lymphatics: Non tender without lymphadenopathy.  Genitourinary:  Musculoskeletal: Full ROM all peripheral extremities, joint stability, 5/5 strength, and normal gait. Skin: Warm and dry without rashes, lesions, cyanosis, clubbing or  ecchymosis.  Neuro: Cranial nerves intact, reflexes equal bilaterally. Normal muscle tone, no cerebellar symptoms. Sensation intact to touch, vibratory and Monofilament to the toes  bilaterally. Pysch: Alert and oriented X 3, normal affect, Insight and Judgment appropriate.   Assessment and Plan  1. Essential hypertension  - EKG 12-Lead - Urinalysis, Routine w reflex microscopic - Microalbumin / creatinine urine ratio - CBC with Differential/Platelet - COMPLETE METABOLIC PANEL WITH GFR - Magnesium - TSH  2. Hyperlipidemia, mixed  - EKG 12-Lead - Lipid panel - TSH  3. Type 2 diabetes mellitus with stage 3 chronic kidney disease, without long-term current use of insulin (HCC)  - EKG 12-Lead - HM DIABETES FOOT EXAM - LOW EXTREMITY NEUR EXAM DOCUM - Hemoglobin A1c - Insulin, random  4. Vitamin D deficiency  - VITAMIN D 25 Hydroxyl  5. Hypothyroidism  - TSH  6. Gastroesophageal reflux  disease  - CBC with Differential/Platelet  7. Screening for colorectal cancer  - POC Hemoccult Bld/Stl  8. Screening for ischemic heart disease  - EKG 12-Lead  9. Medication management  - Urinalysis, Routine w reflex microscopic - Microalbumin / creatinine urine ratio - CBC with Differential/Platelet - COMPLETE METABOLIC PANEL WITH GFR - Magnesium - Lipid panel - TSH - Hemoglobin A1c - Insulin, random - VITAMIN D 25 Hydroxyl        Patient was counseled in prudent diet to achieve/maintain BMI less than 25 for weight control, BP monitoring, regular exercise and medications. Discussed med's effects and SE's. Screening labs and tests as requested with regular follow-up as recommended. Over 40 minutes of exam, counseling, chart review and high complex critical decision making was performed.

## 2018-07-01 NOTE — Patient Instructions (Signed)

## 2018-07-02 LAB — CBC WITH DIFFERENTIAL/PLATELET
BASOS ABS: 92 {cells}/uL (ref 0–200)
Basophils Relative: 1 %
EOS ABS: 184 {cells}/uL (ref 15–500)
Eosinophils Relative: 2 %
HCT: 33.8 % — ABNORMAL LOW (ref 35.0–45.0)
Hemoglobin: 11 g/dL — ABNORMAL LOW (ref 11.7–15.5)
Lymphs Abs: 2309 cells/uL (ref 850–3900)
MCH: 30.3 pg (ref 27.0–33.0)
MCHC: 32.5 g/dL (ref 32.0–36.0)
MCV: 93.1 fL (ref 80.0–100.0)
MONOS PCT: 6.9 %
MPV: 10.1 fL (ref 7.5–12.5)
NEUTROS PCT: 65 %
Neutro Abs: 5980 cells/uL (ref 1500–7800)
Platelets: 303 10*3/uL (ref 140–400)
RBC: 3.63 10*6/uL — ABNORMAL LOW (ref 3.80–5.10)
RDW: 12.1 % (ref 11.0–15.0)
Total Lymphocyte: 25.1 %
WBC mixed population: 635 cells/uL (ref 200–950)
WBC: 9.2 10*3/uL (ref 3.8–10.8)

## 2018-07-02 LAB — URINALYSIS, ROUTINE W REFLEX MICROSCOPIC
Bilirubin Urine: NEGATIVE
Glucose, UA: NEGATIVE
HGB URINE DIPSTICK: NEGATIVE
KETONES UR: NEGATIVE
Leukocytes, UA: NEGATIVE
NITRITE: NEGATIVE
PH: 6.5 (ref 5.0–8.0)
Protein, ur: NEGATIVE
SPECIFIC GRAVITY, URINE: 1.018 (ref 1.001–1.03)

## 2018-07-02 LAB — COMPLETE METABOLIC PANEL WITH GFR
AG Ratio: 1.8 (calc) (ref 1.0–2.5)
ALT: 14 U/L (ref 6–29)
AST: 26 U/L (ref 10–35)
Albumin: 4.4 g/dL (ref 3.6–5.1)
Alkaline phosphatase (APISO): 55 U/L (ref 33–130)
BUN/Creatinine Ratio: 17 (calc) (ref 6–22)
BUN: 21 mg/dL (ref 7–25)
CALCIUM: 9.9 mg/dL (ref 8.6–10.4)
CO2: 29 mmol/L (ref 20–32)
CREATININE: 1.22 mg/dL — AB (ref 0.60–0.88)
Chloride: 105 mmol/L (ref 98–110)
GFR, EST AFRICAN AMERICAN: 47 mL/min/{1.73_m2} — AB (ref >=60)
GFR, EST NON AFRICAN AMERICAN: 41 mL/min/{1.73_m2} — AB (ref >=60)
GLOBULIN: 2.4 g/dL (ref 1.9–3.7)
Glucose, Bld: 104 mg/dL — ABNORMAL HIGH (ref 65–99)
Potassium: 4.1 mmol/L (ref 3.5–5.3)
Sodium: 144 mmol/L (ref 135–146)
TOTAL PROTEIN: 6.8 g/dL (ref 6.1–8.1)
Total Bilirubin: 0.4 mg/dL (ref 0.2–1.2)

## 2018-07-02 LAB — INSULIN, RANDOM: Insulin: 2.2 u[IU]/mL (ref 2.0–19.6)

## 2018-07-02 LAB — MICROALBUMIN / CREATININE URINE RATIO
Creatinine, Urine: 96 mg/dL (ref 20–275)
MICROALB UR: 3.3 mg/dL
MICROALB/CREAT RATIO: 34 ug/mg{creat} — AB (ref <30)

## 2018-07-02 LAB — LIPID PANEL
CHOL/HDL RATIO: 2.7 (calc) (ref <5.0)
CHOLESTEROL: 138 mg/dL (ref <200)
HDL: 52 mg/dL (ref >50)
LDL CHOLESTEROL (CALC): 64 mg/dL
NON-HDL CHOLESTEROL (CALC): 86 mg/dL (ref <130)
Triglycerides: 135 mg/dL (ref <150)

## 2018-07-02 LAB — HEMOGLOBIN A1C
Hgb A1c MFr Bld: 6.5 % of total Hgb — ABNORMAL HIGH (ref <5.7)
Mean Plasma Glucose: 140 (calc)
eAG (mmol/L): 7.7 (calc)

## 2018-07-02 LAB — MAGNESIUM: Magnesium: 2 mg/dL (ref 1.5–2.5)

## 2018-07-02 LAB — VITAMIN D 25 HYDROXY (VIT D DEFICIENCY, FRACTURES): VIT D 25 HYDROXY: 64 ng/mL (ref 30–100)

## 2018-07-02 LAB — TSH: TSH: 1.25 mIU/L (ref 0.40–4.50)

## 2018-07-04 ENCOUNTER — Encounter: Payer: Self-pay | Admitting: Internal Medicine

## 2018-07-06 ENCOUNTER — Other Ambulatory Visit: Payer: Self-pay | Admitting: Internal Medicine

## 2018-07-06 MED ORDER — PREDNISONE 20 MG PO TABS: ORAL_TABLET | ORAL | 1 refills | Status: DC

## 2018-07-21 ENCOUNTER — Other Ambulatory Visit: Payer: Self-pay | Admitting: Adult Health

## 2018-07-21 ENCOUNTER — Other Ambulatory Visit: Payer: Self-pay | Admitting: Internal Medicine

## 2018-07-27 ENCOUNTER — Other Ambulatory Visit: Payer: Self-pay

## 2018-07-27 DIAGNOSIS — Z1212 Encounter for screening for malignant neoplasm of rectum: Principal | ICD-10-CM

## 2018-07-27 DIAGNOSIS — Z1211 Encounter for screening for malignant neoplasm of colon: Secondary | ICD-10-CM

## 2018-07-27 LAB — POC HEMOCCULT BLD/STL (HOME/3-CARD/SCREEN)
Card #2 Fecal Occult Blod, POC: NEGATIVE
FECAL OCCULT BLD: NEGATIVE
FECAL OCCULT BLD: NEGATIVE

## 2018-08-09 DIAGNOSIS — E119 Type 2 diabetes mellitus without complications: Secondary | ICD-10-CM | POA: Diagnosis not present

## 2018-08-09 DIAGNOSIS — H5203 Hypermetropia, bilateral: Secondary | ICD-10-CM | POA: Diagnosis not present

## 2018-08-09 DIAGNOSIS — H02055 Trichiasis without entropian left lower eyelid: Secondary | ICD-10-CM | POA: Diagnosis not present

## 2018-08-09 DIAGNOSIS — H04123 Dry eye syndrome of bilateral lacrimal glands: Secondary | ICD-10-CM | POA: Diagnosis not present

## 2018-08-09 LAB — HM DIABETES EYE EXAM

## 2018-08-17 ENCOUNTER — Encounter: Payer: Self-pay | Admitting: *Deleted

## 2018-08-23 ENCOUNTER — Other Ambulatory Visit: Payer: Self-pay | Admitting: Internal Medicine

## 2018-09-15 ENCOUNTER — Other Ambulatory Visit: Payer: Self-pay | Admitting: Internal Medicine

## 2018-09-23 DIAGNOSIS — S3992XA Unspecified injury of lower back, initial encounter: Secondary | ICD-10-CM | POA: Diagnosis not present

## 2018-09-23 DIAGNOSIS — Z96653 Presence of artificial knee joint, bilateral: Secondary | ICD-10-CM | POA: Diagnosis not present

## 2018-09-23 DIAGNOSIS — M25561 Pain in right knee: Secondary | ICD-10-CM | POA: Diagnosis not present

## 2018-09-24 DIAGNOSIS — Z96653 Presence of artificial knee joint, bilateral: Secondary | ICD-10-CM | POA: Diagnosis not present

## 2018-09-24 DIAGNOSIS — M461 Sacroiliitis, not elsewhere classified: Secondary | ICD-10-CM | POA: Diagnosis not present

## 2018-09-24 DIAGNOSIS — R531 Weakness: Secondary | ICD-10-CM | POA: Diagnosis not present

## 2018-09-27 DIAGNOSIS — R531 Weakness: Secondary | ICD-10-CM | POA: Diagnosis not present

## 2018-09-27 DIAGNOSIS — Z96653 Presence of artificial knee joint, bilateral: Secondary | ICD-10-CM | POA: Diagnosis not present

## 2018-09-27 DIAGNOSIS — M461 Sacroiliitis, not elsewhere classified: Secondary | ICD-10-CM | POA: Diagnosis not present

## 2018-09-29 DIAGNOSIS — Z96653 Presence of artificial knee joint, bilateral: Secondary | ICD-10-CM | POA: Diagnosis not present

## 2018-09-29 DIAGNOSIS — R531 Weakness: Secondary | ICD-10-CM | POA: Diagnosis not present

## 2018-09-29 DIAGNOSIS — M461 Sacroiliitis, not elsewhere classified: Secondary | ICD-10-CM | POA: Diagnosis not present

## 2018-10-03 NOTE — Progress Notes (Signed)
FOLLOW UP 3 month  Assessment and Plan:   Diagnoses and all orders for this visit:  Essential hypertension -     CBC with Differential/Platelet -     COMPLETE METABOLIC PANEL WITH GFR -     Magnesium -     Cancel: Urinalysis w microscopic + reflex cultur -     Cancel: Microalbumin / creatinine urine ratio  Gastroesophageal reflux disease without esophagitis Doing well continue current regiment  Hypothyroidism, unspecified type Taking levothyroxine Will check TSH  CKD stage 3 due to type 2 diabetes mellitus (HCC) Increase fluids, avoid NSAIDS, monitor sugars, will monitor  Osteopenia, unspecified location -     VITAMIN D 25 Hydroxy   Type 2 diabetes mellitus with stage 3 chronic kidney disease, without long-term current use of insulin (HCC) -     Hemoglobin A1c -     Insulin, random Continue medication: Continue diet and exercise.  Perform daily foot/skin check, notify office of any concerning changes.  Check A1C  Osteoarthritis, unspecified osteoarthritis type, unspecified site Doing well on current regiment Continue with benefit  Vitamin D deficiency -     VITAMIN D 25 Hydroxy (Vit-D Deficiency, Fractures)  Depression, major, recurrent, in partial remission (HCC) -     VITAMIN D 25 Hydroxy   BMI 25.0-25.9,adult Discussed dietary and exercise modifications  Medication management -     CBC with Differential/Platelet -     COMPLETE METABOLIC PANEL WITH GFR -     Lipid panel -     TSH -     Magnesium -     VITAMIN D 25 Hydroxy (Vit-D Deficiency, Fractures) -     Cancel: Urinalysis w microscopic + reflex cultur -     Cancel: Microalbumin / creatinine urine ratio -     Vitamin B12 -     Hemoglobin A1c -     Insulin, random  Hyperlipidemia, mixed -     Lipid panel  Need for flu shot: Received vaccination today   Continue diet and meds as discussed. Further disposition pending results of labs. Discussed med's effects and SE's.   Over 30 minutes of  exam, counseling, chart review, and critical decision making was performed.   Future Appointments  Date Time Provider Department Center  01/04/2019  3:30 PM [name], MD [facility] None  08/08/2019  3:00 PM [name], MD [facility] None    ----------------------------------------------------------------------------------------------------------------------  HPI 84 y.o. female  presents for 3 month follow up on hypertension, cholesterol, diabetes, weight and vitamin D deficiency.   Reports she fell about two months ago while trying to step out of a car.  and was evaluated by Lala Lund.  She is completing therapy related to this fall. This very nice 84 y.o. [facility] presents for a   management of multiple medical co-morbidities.  Patient has been followed for HTN, HLD, T2_NIDDM and Vitamin D Deficiency. Patient also has GERD controlled w/diet & her meds  Reports that she was stung by a wasp on her right hand and she received a prednisone pack for this  She also reports trouble sleeping at night.  She takes trazadone but just recently she has had trouble going and staying asleep.                                            HTN predates circa 1987. Patient's BP has  been controlled at home and patient denies any cardiac symptoms as chest pain, palpitations, shortness of breath, dizziness or ankle swelling. Today's BP is at goal - 122/64.                                          Patient's hyperlipidemia is controlled with diet and medications. Patient denies myalgias or other medication SE's. Last lipids were at goal: RecentLabs       Lab Results  Component Value Date   CHOL 138 07/01/2018   HDL 52 07/01/2018   LDLCALC 64 07/01/2018   TRIG 135 07/01/2018   CHOLHDL 2.7 07/01/2018                                            Patient has hx/o T2_NIDDM (A1c 6.5%/2008, then A1c 7.1%/2016 & A1c 6.7%/2017) and CKD3 (GFR 41) and patient denies reactive hypoglycemic symptoms,  visual blurring, diabetic polys  or paresthesias. She is attempting control with diet and last A1c was still not at goal: RecentLabs       Lab Results  Component Value Date   HGBA1C 6.5 (H) 07/01/2018            She was started on Thyroid replacement in 2012.                             BMI is Body mass index is 25.42 kg/m., she has been working on diet and exercise. Wt Readings from Last 3 Encounters:  10/04/18 139 lb (63 kg)  07/01/18 139 lb 12.8 oz (63.4 kg)  06/04/18 143 lb 3.2 oz (65 kg)    Her blood pressure has been controlled at home, today their BP is BP: 136/74  She does workout. She denies chest pain, shortness of breath, dizziness.   She is on cholesterol medication Atorvastatin and denies myalgias. Her cholesterol is at goal. The cholesterol last visit was:   Lab Results  Component Value Date   CHOL 138 07/01/2018   HDL 52 07/01/2018   LDLCALC 64 07/01/2018   TRIG 135 07/01/2018   CHOLHDL 2.7 07/01/2018    She has been working on diet and exercise for prediabetes, and denies hyperglycemia, hypoglycemia , nausea, polydipsia and polyuria. Last A1C in the office was:  Lab Results  Component Value Date   HGBA1C 6.5 (H) 07/01/2018   Patient is on Vitamin D supplement.  Starting taking supplements in 2009. Lab Results  Component Value Date   VD25OH 64 07/01/2018        Current Medications:  Current Outpatient Medications on File Prior to Visit  Medication Sig  . aspirin 81 MG tablet Take 81 mg by mouth daily.  [name] Kitchen atenolol (TENORMIN) 100 MG tablet TAKE 1 TABLET BY MOUTH ONCE DAILY FOR BLOOD PRESSURE  . atorvastatin (LIPITOR) 80 MG tablet TAKE 1 TABLET BY MOUTH ONCE DAIL\Y  . Biotin 10 MG CAPS Take 1 tablet by mouth daily.   . cholecalciferol (VITAMIN D) 1000 UNITS tablet Take 1,000 Units by mouth daily.  . citalopram (CELEXA) 20 MG tablet TAKE 1 TABLET BY MOUTH ONCE DAILY FOR MOOD  . docusate sodium (COLACE) 100 MG capsule Take 1 capsule (100 mg total) by  mouth 3 (three) times  daily as needed.  . furosemide (LASIX) 40 MG tablet take 1 tablet by mouth once daily  . glucose blood test strip Check blood sugar 1 time daily-DX-E11.22.  . hydrOXYzine (ATARAX/VISTARIL) 10 MG tablet TAKE 1 TABLET BY MOUTH THREE TIMES DAILY FOR NERVES  . lansoprazole (PREVACID) 15 MG capsule Take 15 mg by mouth daily at 12 noon.  [name] Kitchen levothyroxine (SYNTHROID, LEVOTHROID) 100 MCG tablet take 1 tablet by mouth every morning  . losartan (COZAAR) 100 MG tablet TAKE 1 TABLET BY MOUTH ONCE DAILY  . mexiletine (MEXITIL) 150 MG capsule TAKE 1 CAPSULE BY MOUTH THREE TIMES DAILY  . minoxidil (LONITEN) 10 MG tablet Take 1/2 to 1 tablet daily for BP  . Multiple Vitamins-Minerals (MULTIVITAMIN WITH MINERALS) tablet Take 1 tablet by mouth daily.  . ondansetron (ZOFRAN) 4 MG tablet Take 1 tablet (4 mg total) by mouth every 8 (eight) hours as needed for nausea or vomiting.  . predniSONE (DELTASONE) 20 MG tablet TAKE 1 TABLET BY MOUTH THREE TIMES DAILY FOR 3 DAYS, THEN 1 TABLET TWICE DAILY FOR 3 DAYS, THEN 1 TABLET EVERY DAY FOR 5 DAYS  . PRODIGY LANCETS 28G MISC Check blood sugar 1 time daily-DX-E11.22.  . propranolol ER (INDERAL LA) 60 MG 24 hr capsule Take 1 capsule (60 mg total) by mouth daily.  . trazodone (DESYREL) 300 MG tablet TAKE 1 TABLET BY MOUTH AT BEDTIME  . trazodone (DESYREL) 300 MG tablet TAKE 1 TABLET BY MOUTH AT BEDTIME  . verapamil (VERELAN PM) 120 MG 24 hr capsule TAKE ONE CAPSULE BY MOUTH TWICE DAILY WITH FOOD   No current facility-administered medications on file prior to visit.      Allergies:  Allergies  Allergen Reactions  . Codeine Rash    rash  . Vasotec [Enalapril] Other (See Comments)    cough  . Augmentin [Amoxicillin-Pot Clavulanate] Other (See Comments)    vaginitis  . Hytrin [Terazosin] Other (See Comments)    incontinence   . Oxycodone Rash    rash  . Procardia [Nifedipine] Other (See Comments)    edema     Medical History:  Past Medical  History:  Diagnosis Date  . Anemia   . Anxiety   . Arthritis   . Constipation   . Depression   . GERD (gastroesophageal reflux disease)   . History of blood transfusion   . History of toe surgery left  . Hyperlipidemia   . Hypertension   . Hypothyroidism   . Insomnia   . Radiculopathy 02/06/2016  . Thyroid disease   . Type II or unspecified type diabetes mellitus with renal manifestations, not stated as uncontrolled(250.40)    stage II, does not take medication  . Wears glasses    Family history- Reviewed and unchanged Social history- Reviewed and unchanged   Review of Systems:  ROS    Physical Exam: BP 136/74   Pulse 67   Temp 98.6 F (37 C)   Ht 5\' 2"  (1.575 m)   Wt 139 lb (63 kg)   SpO2 98%   BMI 25.42 kg/m  Wt Readings from Last 3 Encounters:  10/04/18 139 lb (63 kg)  07/01/18 139 lb 12.8 oz (63.4 kg)  06/04/18 143 lb 3.2 oz (65 kg)   General Appearance: Well nourished, in no apparent distress. Eyes: PERRLA, EOMs, conjunctiva no swelling or erythema Sinuses: No Frontal/maxillary tenderness ENT/Mouth: Ext aud canals clear, TMs without erythema, bulging. No erythema, swelling, or exudate on post pharynx.  Tonsils not swollen or  erythematous. Hearing normal.  Neck: Supple, thyroid normal.  Respiratory: Respiratory effort normal, BS equal bilaterally without rales, rhonchi, wheezing or stridor.  Cardio: RRR with no MRGs. Brisk peripheral pulses without edema.  Abdomen: Soft, + BS.  Non tender, no guarding, rebound, hernias, masses. Lymphatics: Non tender without lymphadenopathy.  Musculoskeletal: Full ROM, 5/5 strength, Antalgic with initiation, related to recent fall.   Skin: Warm, dry without rashes, lesions, ecchymosis.  Neuro: Cranial nerves intact. No cerebellar symptoms.  Psych: Awake and oriented X 3, normal affect, Insight and Judgment appropriate.    Names of Other Physician/Practitioners you currently use: 1. [facility] here for primary care 2. Eye Exam: 2019 3. Dentist 2019 Patient Care Team: Lucky CowboyMcKeown, William, MD as PCP - General (Internal Medicine)    Screening Tests: Immunization History  Administered Date(s) Administered  . Influenza, High Dose Seasonal PF 09/05/2014, 10/02/2015, 07/24/2016, 09/16/2017  . Influenza-Unspecified 09/10/2013  . Pneumococcal Conjugate-13 07/24/2016  . Pneumococcal-Unspecified 11/26/2012  . Td 11/26/2012  . Tdap 08/24/2014    Preventative care: Last colonoscopy: 2011 Last mammogram: 2019 Last pap smear/pelvic exam: N/A DEXA: 2019   Vaccinations: TD or Tdap: 2015  Influenza: 2019 Pneumococcal: 2014 Prevnar13: 2017 Shingles/Zostavax: N/A   Elder NegusKyra Ladawn Boullion, NP 5:20 PM [facility]

## 2018-10-04 ENCOUNTER — Encounter: Payer: Self-pay | Admitting: Adult Health Nurse Practitioner

## 2018-10-04 ENCOUNTER — Ambulatory Visit (INDEPENDENT_AMBULATORY_CARE_PROVIDER_SITE_OTHER): Payer: Medicare Other | Admitting: Adult Health Nurse Practitioner

## 2018-10-04 VITALS — BP 136/74 | HR 67 | Temp 98.6°F | Ht 62.0 in | Wt 139.0 lb

## 2018-10-04 DIAGNOSIS — F3341 Major depressive disorder, recurrent, in partial remission: Secondary | ICD-10-CM

## 2018-10-04 DIAGNOSIS — K219 Gastro-esophageal reflux disease without esophagitis: Secondary | ICD-10-CM

## 2018-10-04 DIAGNOSIS — Z23 Encounter for immunization: Secondary | ICD-10-CM

## 2018-10-04 DIAGNOSIS — I10 Essential (primary) hypertension: Secondary | ICD-10-CM | POA: Diagnosis not present

## 2018-10-04 DIAGNOSIS — M1990 Unspecified osteoarthritis, unspecified site: Secondary | ICD-10-CM | POA: Diagnosis not present

## 2018-10-04 DIAGNOSIS — Z6825 Body mass index (BMI) 25.0-25.9, adult: Secondary | ICD-10-CM | POA: Diagnosis not present

## 2018-10-04 DIAGNOSIS — N183 Chronic kidney disease, stage 3 unspecified: Secondary | ICD-10-CM

## 2018-10-04 DIAGNOSIS — Z79899 Other long term (current) drug therapy: Secondary | ICD-10-CM

## 2018-10-04 DIAGNOSIS — M8580 Other specified disorders of bone density and structure, unspecified site: Secondary | ICD-10-CM

## 2018-10-04 DIAGNOSIS — E039 Hypothyroidism, unspecified: Secondary | ICD-10-CM

## 2018-10-04 DIAGNOSIS — E1122 Type 2 diabetes mellitus with diabetic chronic kidney disease: Secondary | ICD-10-CM | POA: Diagnosis not present

## 2018-10-04 DIAGNOSIS — E782 Mixed hyperlipidemia: Secondary | ICD-10-CM | POA: Diagnosis not present

## 2018-10-04 DIAGNOSIS — E559 Vitamin D deficiency, unspecified: Secondary | ICD-10-CM

## 2018-10-04 NOTE — Patient Instructions (Addendum)
Monitor your sleep, this should improve since you have completed the prednisone for the wasp sting.  If it does not improve, please let us know.  You received your influenza vaccination today 10/04/18  You are up to date with all of your health screenings and immunizations     We Do NOT Approve of  [facility], [employer]  Our Patients  To Do Home Visits & We Do NOT Approve of [employer] > > > > > > > > > > > > > > > > > > > > > > > > > > > > > > > > > > > > > > >  Preventive Care for Adults  A healthy lifestyle and preventive care can promote health and wellness. Preventive health guidelines for women include the following key practices.  A routine yearly physical is a good way to check with your health care provider about your health and preventive screening. It is a chance to share any concerns and updates on your health and to receive a thorough exam.  Visit your dentist for a routine exam and preventive care every 6 months. Brush your teeth twice a day and floss once a day. Good oral hygiene prevents tooth decay and gum disease.  The frequency of eye exams is based on your age, health, family medical history, use of contact lenses, and other factors. Follow your health care provider's recommendations for frequency of eye exams.  Eat a healthy diet. Foods like vegetables, fruits, whole grains, low-fat dairy products, and lean protein foods contain the nutrients you need without too many calories. Decrease your intake of foods high in solid fats, added sugars, and salt. Eat the right amount of calories for you. Get information about a proper diet from your health care provider, if necessary.  Regular physical exercise is one of the most important things you can do for your health. Most adults should get at least 150 minutes of moderate-intensity exercise (any activity that increases your heart rate and causes you to sweat) each week. In addition, most adults  need muscle-strengthening exercises on 2 or more days a week.  Maintain a healthy weight. The body mass index (BMI) is a screening tool to identify possible weight problems. It provides an estimate of body fat based on height and weight. Your health care provider can find your BMI and can help you achieve or maintain a healthy weight. For adults 20 years and older:  A BMI below 18.5 is considered underweight.  A BMI of 18.5 to 24.9 is normal.  A BMI of 25 to 29.9 is considered overweight.  A BMI of 30 and above is considered obese.  Maintain normal blood lipids and cholesterol levels by exercising and minimizing your intake of saturated fat. Eat a balanced diet with plenty of fruit and vegetables. If your lipid or cholesterol levels are high, you are over 50, or you are at high risk for heart disease, you may need your cholesterol levels checked more frequently. Ongoing high lipid and cholesterol levels should be treated with medicines if diet and exercise are not working.  If you smoke, find out from your health care provider how to quit. If you do not use tobacco, do not start.  Lung cancer screening is recommended for adults aged 70-80 years who are at high risk for developing lung cancer because of a history of smoking. A yearly low-dose CT scan of the lungs is recommended for people who have at  least a 30-pack-year history of smoking and are a current smoker or have quit within the past 15 years. A pack year of smoking is smoking an average of 1 pack of cigarettes a day for 1 year (for example: 1 pack a day for 30 years or 2 packs a day for 15 years). Yearly screening should continue until the smoker has stopped smoking for at least 15 years. Yearly screening should be stopped for people who develop a health problem that would prevent them from having lung cancer treatment.  Avoid use of street drugs. Do not share needles with anyone. Ask for help if you need support or instructions about  stopping the use of drugs.  High blood pressure causes heart disease and increases the risk of stroke.  Ongoing high blood pressure should be treated with medicines if weight loss and exercise do not work.  If you are 55-79 years old, ask your health care provider if you should take aspirin to prevent strokes.  Diabetes screening involves taking a blood sample to check your fasting blood sugar level. This should be done once every 3 years, after age 45, if you are within normal weight and without risk factors for diabetes. Testing should be considered at a younger age or be carried out more frequently if you are overweight and have at least 1 risk factor for diabetes.  Breast cancer screening is essential preventive care for women. You should practice "breast self-awareness." This means understanding the normal appearance and feel of your breasts and may include breast self-examination. Any changes detected, no matter how small, should be reported to a health care provider. Women in their 20s and 30s should have a clinical breast exam (CBE) by a health care provider as part of a regular health exam every 1 to 3 years. After age 40, women should have a CBE every year. Starting at age 40, women should consider having a mammogram (breast X-ray test) every year. Women who have a family history of breast cancer should talk to their health care provider about genetic screening. Women at a high risk of breast cancer should talk to their health care providers about having an MRI and a mammogram every year.  Breast cancer gene (BRCA)-related cancer risk assessment is recommended for women who have family members with BRCA-related cancers. BRCA-related cancers include breast, ovarian, tubal, and peritoneal cancers. Having family members with these cancers may be associated with an increased risk for harmful changes (mutations) in the breast cancer genes BRCA1 and BRCA2. Results of the assessment will determine the  need for genetic counseling and BRCA1 and BRCA2 testing.  Routine pelvic exams to screen for cancer are no longer recommended for nonpregnant women who are considered low risk for cancer of the pelvic organs (ovaries, uterus, and vagina) and who do not have symptoms. Ask your health care provider if a screening pelvic exam is right for you.  If you have had past treatment for cervical cancer or a condition that could lead to cancer, you need Pap tests and screening for cancer for at least 20 years after your treatment. If Pap tests have been discontinued, your risk factors (such as having a new sexual partner) need to be reassessed to determine if screening should be resumed. Some women have medical problems that increase the chance of getting cervical cancer. In these cases, your health care provider may recommend more frequent screening and Pap tests.    Colorectal cancer can be detected and often prevented. Most   routine colorectal cancer screening begins at the age of 50 years and continues through age 75 years. However, your health care provider may recommend screening at an earlier age if you have risk factors for colon cancer. On a yearly basis, your health care provider may provide home test kits to check for hidden blood in the stool. Use of a small camera at the end of a tube, to directly examine the colon (sigmoidoscopy or colonoscopy), can detect the earliest forms of colorectal cancer. Talk to your health care provider about this at age 50, when routine screening begins.  Direct exam of the colon should be repeated every 5-10 years through age 75 years, unless early forms of pre-cancerous polyps or small growths are found.  Osteoporosis is a disease in which the bones lose minerals and strength with aging. This can result in serious bone fractures or breaks. The risk of osteoporosis can be identified using a bone density scan. Women ages 65 years and over and women at risk for fractures or  osteoporosis should discuss screening with their health care providers. Ask your health care provider whether you should take a calcium supplement or vitamin D to reduce the rate of osteoporosis.  Menopause can be associated with physical symptoms and risks. Hormone replacement therapy is available to decrease symptoms and risks. You should talk to your health care provider about whether hormone replacement therapy is right for you.  Use sunscreen. Apply sunscreen liberally and repeatedly throughout the day. You should seek shade when your shadow is shorter than you. Protect yourself by wearing long sleeves, pants, a wide-brimmed hat, and sunglasses year round, whenever you are outdoors.  Once a month, do a whole body skin exam, using a mirror to look at the skin on your back. Tell your health care provider of new moles, moles that have irregular borders, moles that are larger than a pencil eraser, or moles that have changed in shape or color.  Stay current with required vaccines (immunizations).  Influenza vaccine. All adults should be immunized every year.  Tetanus, diphtheria, and acellular pertussis (Td, Tdap) vaccine. Pregnant women should receive 1 dose of Tdap vaccine during each pregnancy. The dose should be obtained regardless of the length of time since the last dose. Immunization is preferred during the 27th-36th week of gestation. An adult who has not previously received Tdap or who does not know her vaccine status should receive 1 dose of Tdap. This initial dose should be followed by tetanus and diphtheria toxoids (Td) booster doses every 10 years. Adults with an unknown or incomplete history of completing a 3-dose immunization series with Td-containing vaccines should begin or complete a primary immunization series including a Tdap dose. Adults should receive a Td booster every 10 years.    Zoster vaccine. One dose is recommended for adults aged 60 years or older unless certain  conditions are present.    Pneumococcal 13-valent conjugate (PCV13) vaccine. When indicated, a person who is uncertain of her immunization history and has no record of immunization should receive the PCV13 vaccine. An adult aged 19 years or older who has certain medical conditions and has not been previously immunized should receive 1 dose of PCV13 vaccine. This PCV13 should be followed with a dose of pneumococcal polysaccharide (PPSV23) vaccine. The PPSV23 vaccine dose should be obtained at least 1 or more year(s) after the dose of PCV13 vaccine. An adult aged 19 years or older who has certain medical conditions and previously received 1 or more   doses of PPSV23 vaccine should receive 1 dose of PCV13. The PCV13 vaccine dose should be obtained 1 or more years after the last PPSV23 vaccine dose.    Pneumococcal polysaccharide (PPSV23) vaccine. When PCV13 is also indicated, PCV13 should be obtained first. All adults aged 65 years and older should be immunized. An adult younger than age 65 years who has certain medical conditions should be immunized. Any person who resides in a nursing home or long-term care facility should be immunized. An adult smoker should be immunized. People with an immunocompromised condition and certain other conditions should receive both PCV13 and PPSV23 vaccines. People with human immunodeficiency virus (HIV) infection should be immunized as soon as possible after diagnosis. Immunization during chemotherapy or radiation therapy should be avoided. Routine use of PPSV23 vaccine is not recommended for American Indians, [place] Natives, or people younger than 65 years unless there are medical conditions that require PPSV23 vaccine. When indicated, people who have unknown immunization and have no record of immunization should receive PPSV23 vaccine. One-time revaccination 5 years after the first dose of PPSV23 is recommended for people aged 19-64 years who have chronic kidney failure,  nephrotic syndrome, asplenia, or immunocompromised conditions. People who received 1-2 doses of PPSV23 before age 65 years should receive another dose of PPSV23 vaccine at age 65 years or later if at least 5 years have passed since the previous dose. Doses of PPSV23 are not needed for people immunized with PPSV23 at or after age 65 years.   Preventive Services / Frequency  Ages 65 years and over  Blood pressure check.  Lipid and cholesterol check.  Lung cancer screening. / Every year if you are aged 55-80 years and have a 30-pack-year history of smoking and currently smoke or have quit within the past 15 years. Yearly screening is stopped once you have quit smoking for at least 15 years or develop a health problem that would prevent you from having lung cancer treatment.  Clinical breast exam.** / Every year after age 40 years.   BRCA-related cancer risk assessment.** / For women who have family members with a BRCA-related cancer (breast, ovarian, tubal, or peritoneal cancers).  Mammogram.** / Every year beginning at age 40 years and continuing for as long as you are in good health. Consult with your health care provider.  Pap test.** / Every 3 years starting at age 30 years through age 65 or 70 years with 3 consecutive normal Pap tests. Testing can be stopped between 65 and 70 years with 3 consecutive normal Pap tests and no abnormal Pap or HPV tests in the past 10 years.  Fecal occult blood test (FOBT) of stool. / Every year beginning at age 50 years and continuing until age 75 years. You may not need to do this test if you get a colonoscopy every 10 years.  Flexible sigmoidoscopy or colonoscopy.** / Every 5 years for a flexible sigmoidoscopy or every 10 years for a colonoscopy beginning at age 50 years and continuing until age 75 years.  Hepatitis C blood test.** / For all people born from 1945 through 1965 and any individual with known risks for hepatitis C.  Osteoporosis screening.**  / A one-time screening for women ages 65 years and over and women at risk for fractures or osteoporosis.  Skin self-exam. / Monthly.  Influenza vaccine. / Every year.  Tetanus, diphtheria, and acellular pertussis (Tdap/Td) vaccine.** / 1 dose of Td every 10 years.  Zoster vaccine.** / 1 dose for adults aged   32 years or older.  Pneumococcal 13-valent conjugate (PCV13) vaccine.** / Consult your health care provider.  Pneumococcal polysaccharide (PPSV23) vaccine.** / 1 dose for all adults aged 47 years and older. Screening for abdominal aortic aneurysm (AAA)  by ultrasound is recommended for people who have history of high blood pressure or who are current or former smokers. ++++++++++++++++++++ Recommend Adult Low Dose Aspirin or  coated  Aspirin 81 mg daily  To reduce risk of Colon Cancer 20 %,  Skin Cancer 26 % ,  Melanoma 46%  and  Pancreatic cancer 60% ++++++++++++++++++++ Vitamin D goal  is between 70-100.  Please make sure that you are taking your Vitamin D as directed.  It is very important as a natural anti-inflammatory  helping hair, skin, and nails, as well as reducing stroke and heart attack risk.  It helps your bones and helps with mood. It also decreases numerous cancer risks so please take it as directed.  Low Vit D is associated with a 200-300% higher risk for CANCER  and 200-300% higher risk for HEART   ATTACK  &  STROKE.   .....................................[name] Kitchen It is also associated with higher death rate at younger ages,  autoimmune diseases like Rheumatoid arthritis, Lupus, Multiple Sclerosis.    Also many other serious conditions, like depression, Alzheimer's Dementia, infertility, muscle aches, fatigue, fibromyalgia - just to name a few. ++++++++++++++++++ Recommend the book "The END of DIETING" by Dr Excell Seltzer  & the book "The END of DIABETES " by Dr Excell Seltzer At [email or web address] - get book & Audio CD's    Being diabetic has a  300% increased risk for  heart attack, stroke, cancer, and alzheimer- type vascular dementia. It is very important that you work harder with diet by avoiding all foods that are white. Avoid white rice (brown & wild rice is OK), white potatoes (sweetpotatoes in moderation is OK), White bread or wheat bread or anything made out of white flour like bagels, donuts, rolls, buns, biscuits, cakes, pastries, cookies, pizza crust, and pasta (made from white flour & egg whites) - vegetarian pasta or spinach or wheat pasta is OK. Multigrain breads like Arnold's or Pepperidge [employer], or multigrain sandwich thins or flatbreads.  Diet, exercise and weight loss can reverse and cure diabetes in the early stages.  Diet, exercise and weight loss is very important in the control and prevention of complications of diabetes which affects every system in your body, ie. Brain - dementia/stroke, eyes - glaucoma/blindness, heart - heart attack/heart failure, kidneys - dialysis, stomach - gastric paralysis, intestines - malabsorption, nerves - severe painful neuritis, circulation - gangrene & loss of a leg(s), and finally cancer and Alzheimers.    I recommend avoid fried & greasy foods,  sweets/candy, white rice (brown or wild rice or Quinoa is OK), white potatoes (sweet potatoes are OK) - anything made from white flour - bagels, doughnuts, rolls, buns, biscuits,white and wheat breads, pizza crust and traditional pasta made of white flour & egg white(vegetarian pasta or spinach or wheat pasta is OK).  Multi-grain bread is OK - like multi-grain flat bread or sandwich thins. Avoid alcohol in excess. Exercise is also important.    Eat all the vegetables you want - avoid meat, especially red meat and dairy - especially cheese.  Cheese is the most concentrated form of trans-fats which is the worst thing to clog up our arteries. Veggie cheese is OK which can be found in the fresh produce section at [facility] or [employer]  or [employer]  +++++++++++++++++++ DASH  Eating Plan  DASH stands for "Dietary Approaches to Stop Hypertension."   The DASH eating plan is a healthy eating plan that has been shown to reduce high blood pressure (hypertension). Additional health benefits may include reducing the risk of type 2 diabetes mellitus, heart disease, and stroke. The DASH eating plan may also help with weight loss. WHAT DO I NEED TO KNOW ABOUT THE DASH EATING PLAN? For the DASH eating plan, you will follow these general guidelines:  Choose foods with a percent daily value for sodium of less than 5% (as listed on the food label).  Use salt-free seasonings or herbs instead of table salt or sea salt.  Check with your health care provider or pharmacist before using salt substitutes.  Eat lower-sodium products, often labeled as "lower sodium" or "no salt added."  Eat fresh foods.  Eat more vegetables, fruits, and low-fat dairy products.  Choose whole grains. Look for the word "whole" as the first word in the ingredient list.  Choose fish   Limit sweets, desserts, sugars, and sugary drinks.  Choose heart-healthy fats.  Eat veggie cheese   Eat more home-cooked food and less restaurant, buffet, and fast food.  Limit fried foods.  Cook foods using methods other than frying.  Limit canned vegetables. If you do use them, rinse them well to decrease the sodium.  When eating at a restaurant, ask that your food be prepared with less salt, or no salt if possible.                      WHAT FOODS CAN I EAT? Read Dr Joel Fuhrman's books on The End of Dieting & The End of Diabetes  Grains Whole grain or whole wheat bread. Brown rice. Whole grain or whole wheat pasta. Quinoa, bulgur, and whole grain cereals. Low-sodium cereals. Corn or whole wheat flour tortillas. Whole grain cornbread. Whole grain crackers. Low-sodium crackers.  Vegetables Fresh or frozen vegetables (raw, steamed, roasted, or grilled). Low-sodium or reduced-sodium tomato and vegetable  juices. Low-sodium or reduced-sodium tomato sauce and paste. Low-sodium or reduced-sodium canned vegetables.   Fruits All fresh, canned (in natural juice), or frozen fruits.  Protein Products  All fish and seafood.  Dried beans, peas, or lentils. Unsalted nuts and seeds. Unsalted canned beans.  Dairy Low-fat dairy products, such as skim or 1% milk, 2% or reduced-fat cheeses, low-fat ricotta or cottage cheese, or plain low-fat yogurt. Low-sodium or reduced-sodium cheeses.  Fats and Oils Tub margarines without trans fats. Light or reduced-fat mayonnaise and salad dressings (reduced sodium). Avocado. Safflower, olive, or canola oils. Natural peanut or almond butter.  Other Unsalted popcorn and pretzels. The items listed above may not be a complete list of recommended foods or beverages. Contact your dietitian for more options.  +++++++++++++++  WHAT FOODS ARE NOT RECOMMENDED? Grains/ White flour or wheat flour White bread. White pasta. White rice. Refined cornbread. Bagels and croissants. Crackers that contain trans fat.  Vegetables  Creamed or fried vegetables. Vegetables in a . Regular canned vegetables. Regular canned tomato sauce and paste. Regular tomato and vegetable juices.  Fruits Dried fruits. Canned fruit in light or heavy syrup. Fruit juice.  Meat and Other Protein Products Meat in general - RED meat & White meat.  Fatty cuts of meat. Ribs, chicken wings, all processed meats as bacon, sausage, bologna, salami, fatback, hot dogs, bratwurst and packaged luncheon meats.  Dairy Whole or 2% milk, cream, half-and-half, and   cream cheese. Whole-fat or sweetened yogurt. Full-fat cheeses or blue cheese. Non-dairy creamers and whipped toppings. Processed cheese, cheese spreads, or cheese curds.  Condiments Onion and garlic salt, seasoned salt, table salt, and sea salt. Canned and packaged gravies. Worcestershire sauce. Tartar sauce. Barbecue sauce. Teriyaki sauce. Soy sauce,  including reduced sodium. Steak sauce. Fish sauce. Oyster sauce. Cocktail sauce. Horseradish. Ketchup and mustard. Meat flavorings and tenderizers. Bouillon cubes. Hot sauce. Tabasco sauce. Marinades. Taco seasonings. Relishes.  Fats and Oils Butter, stick margarine, lard, shortening and bacon fat. Coconut, palm kernel, or palm oils. Regular salad dressings.  Pickles and olives. Salted popcorn and pretzels.  The items listed above may not be a complete list of foods and beverages to avoid.

## 2018-10-05 DIAGNOSIS — R531 Weakness: Secondary | ICD-10-CM | POA: Diagnosis not present

## 2018-10-05 DIAGNOSIS — Z96653 Presence of artificial knee joint, bilateral: Secondary | ICD-10-CM | POA: Diagnosis not present

## 2018-10-05 DIAGNOSIS — M461 Sacroiliitis, not elsewhere classified: Secondary | ICD-10-CM | POA: Diagnosis not present

## 2018-10-05 LAB — LIPID PANEL
Cholesterol: 141 mg/dL (ref <200)
HDL: 58 mg/dL (ref >50)
LDL Cholesterol (Calc): 64 mg/dL (calc)
Non-HDL Cholesterol (Calc): 83 mg/dL (ref <130)
Total CHOL/HDL Ratio: 2.4 (calc) (ref <5.0)
Triglycerides: 104 mg/dL (ref <150)

## 2018-10-05 LAB — CBC WITH DIFFERENTIAL/PLATELET
Basophils Absolute: 88 cells/uL (ref 0–200)
Basophils Relative: 1 %
Eosinophils Absolute: 282 cells/uL (ref 15–500)
Eosinophils Relative: 3.2 %
HCT: 36 % (ref 35.0–45.0)
Hemoglobin: 12 g/dL (ref 11.7–15.5)
Lymphs Abs: 2957 {cells}/uL (ref 850–3900)
MCH: 30.3 pg (ref 27.0–33.0)
MCHC: 33.3 g/dL (ref 32.0–36.0)
MCV: 90.9 fL (ref 80.0–100.0)
MPV: 10.1 fL (ref 7.5–12.5)
Monocytes Relative: 6.8 %
Neutro Abs: 4875 {cells}/uL (ref 1500–7800)
Neutrophils Relative %: 55.4 %
Platelets: 287 10*3/uL (ref 140–400)
RBC: 3.96 Million/uL (ref 3.80–5.10)
RDW: 11.8 % (ref 11.0–15.0)
Total Lymphocyte: 33.6 %
WBC mixed population: 598 cells/uL (ref 200–950)
WBC: 8.8 10*3/uL (ref 3.8–10.8)

## 2018-10-05 LAB — TSH: TSH: 1.02 m[IU]/L (ref 0.40–4.50)

## 2018-10-05 LAB — COMPLETE METABOLIC PANEL WITHOUT GFR
AST: 19 U/L (ref 10–35)
Alkaline phosphatase (APISO): 71 U/L (ref 33–130)
Calcium: 9.9 mg/dL (ref 8.6–10.4)
GFR, Est Non African American: 41 mL/min/1.73m2 — ABNORMAL LOW (ref >=60)
Globulin: 2.4 g/dL (ref 1.9–3.7)
Potassium: 3.8 mmol/L (ref 3.5–5.3)
Total Bilirubin: 0.3 mg/dL (ref 0.2–1.2)

## 2018-10-05 LAB — HEMOGLOBIN A1C
Hgb A1c MFr Bld: 7 % of total Hgb — ABNORMAL HIGH (ref <5.7)
Mean Plasma Glucose: 154 (calc)
eAG (mmol/L): 8.5 (calc)

## 2018-10-05 LAB — COMPLETE METABOLIC PANEL WITH GFR
AG Ratio: 1.8 (calc) (ref 1.0–2.5)
ALT: 11 U/L (ref 6–29)
Albumin: 4.4 g/dL (ref 3.6–5.1)
BUN/Creatinine Ratio: 14 (calc) (ref 6–22)
BUN: 17 mg/dL (ref 7–25)
CO2: 32 mmol/L (ref 20–32)
Chloride: 103 mmol/L (ref 98–110)
Creat: 1.23 mg/dL — ABNORMAL HIGH (ref 0.60–0.88)
GFR, Est African American: 47 mL/min/{1.73_m2} — ABNORMAL LOW (ref >=60)
Glucose, Bld: 96 mg/dL (ref 65–99)
Sodium: 143 mmol/L (ref 135–146)
Total Protein: 6.8 g/dL (ref 6.1–8.1)

## 2018-10-05 LAB — MAGNESIUM: Magnesium: 2 mg/dL (ref 1.5–2.5)

## 2018-10-05 LAB — INSULIN, RANDOM: Insulin: 2.6 u[IU]/mL (ref 2.0–19.6)

## 2018-10-05 LAB — VITAMIN D 25 HYDROXY (VIT D DEFICIENCY, FRACTURES): Vit D, 25-Hydroxy: 70 ng/mL (ref 30–100)

## 2018-10-05 LAB — VITAMIN B12: Vitamin B-12: 520 pg/mL (ref 200–1100)

## 2018-10-06 DIAGNOSIS — M461 Sacroiliitis, not elsewhere classified: Secondary | ICD-10-CM | POA: Diagnosis not present

## 2018-10-06 DIAGNOSIS — R531 Weakness: Secondary | ICD-10-CM | POA: Diagnosis not present

## 2018-10-06 DIAGNOSIS — Z96653 Presence of artificial knee joint, bilateral: Secondary | ICD-10-CM | POA: Diagnosis not present

## 2018-10-11 ENCOUNTER — Encounter: Payer: Self-pay | Admitting: Adult Health Nurse Practitioner

## 2018-10-11 DIAGNOSIS — R531 Weakness: Secondary | ICD-10-CM | POA: Diagnosis not present

## 2018-10-11 DIAGNOSIS — M461 Sacroiliitis, not elsewhere classified: Secondary | ICD-10-CM | POA: Diagnosis not present

## 2018-10-11 DIAGNOSIS — Z96653 Presence of artificial knee joint, bilateral: Secondary | ICD-10-CM | POA: Diagnosis not present

## 2018-10-13 DIAGNOSIS — R531 Weakness: Secondary | ICD-10-CM | POA: Diagnosis not present

## 2018-10-13 DIAGNOSIS — M461 Sacroiliitis, not elsewhere classified: Secondary | ICD-10-CM | POA: Diagnosis not present

## 2018-10-13 DIAGNOSIS — Z96653 Presence of artificial knee joint, bilateral: Secondary | ICD-10-CM | POA: Diagnosis not present

## 2018-10-15 ENCOUNTER — Other Ambulatory Visit: Payer: Self-pay | Admitting: Internal Medicine

## 2018-10-27 ENCOUNTER — Other Ambulatory Visit: Payer: Self-pay | Admitting: Internal Medicine

## 2018-10-28 DIAGNOSIS — R531 Weakness: Secondary | ICD-10-CM | POA: Diagnosis not present

## 2018-10-28 DIAGNOSIS — M461 Sacroiliitis, not elsewhere classified: Secondary | ICD-10-CM | POA: Diagnosis not present

## 2018-10-28 DIAGNOSIS — Z96653 Presence of artificial knee joint, bilateral: Secondary | ICD-10-CM | POA: Diagnosis not present

## 2018-10-29 ENCOUNTER — Other Ambulatory Visit: Payer: Self-pay | Admitting: Internal Medicine

## 2018-10-29 DIAGNOSIS — H1013 Acute atopic conjunctivitis, bilateral: Secondary | ICD-10-CM

## 2018-10-29 DIAGNOSIS — J301 Allergic rhinitis due to pollen: Secondary | ICD-10-CM

## 2018-11-30 ENCOUNTER — Other Ambulatory Visit: Payer: Self-pay | Admitting: Internal Medicine

## 2018-11-30 ENCOUNTER — Other Ambulatory Visit: Payer: Self-pay | Admitting: Physician Assistant

## 2018-12-04 ENCOUNTER — Other Ambulatory Visit: Payer: Self-pay | Admitting: Internal Medicine

## 2018-12-04 MED ORDER — PREDNISONE 20 MG PO TABS: ORAL_TABLET | ORAL | 0 refills | Status: DC

## 2018-12-20 ENCOUNTER — Encounter: Payer: Self-pay | Admitting: Adult Health

## 2018-12-20 ENCOUNTER — Ambulatory Visit (INDEPENDENT_AMBULATORY_CARE_PROVIDER_SITE_OTHER): Payer: Medicare Other | Admitting: Adult Health

## 2018-12-20 VITALS — BP 160/80 | HR 67 | Temp 97.5°F | Ht 62.0 in | Wt 140.0 lb

## 2018-12-20 DIAGNOSIS — R300 Dysuria: Secondary | ICD-10-CM | POA: Diagnosis not present

## 2018-12-20 MED ORDER — CIPROFLOXACIN HCL 500 MG PO TABS: 500.0000 mg | ORAL_TABLET | Freq: Two times a day (BID) | ORAL | 0 refills | Status: DC

## 2018-12-20 NOTE — Progress Notes (Signed)
Assessment and Plan:  [name] was seen today for acute visit.  Diagnoses and all orders for this visit:  Dysuria UTI versus OAB versus vaginal dryness - will check UA, C&S. Will start ABX now due to pain Follow up if new symptoms or not resolving Discussed hygiene and UTI prevention -     Urinalysis w microscopic + reflex cultur -     ciprofloxacin (CIPRO) 500 MG tablet; Take 1 tablet (500 mg total) by mouth 2 (two) times daily.  Further disposition pending results of labs. Discussed med's effects and SE's.   Over 15 minutes of exam, counseling, chart review, and critical decision making was performed.   Future Appointments  Date Time Provider Department Center  12/20/2018  4:00 PM Judd Gaudier, NP [facility] None  01/04/2019  3:30 PM [name], MD [facility] None  08/08/2019  3:00 PM [name], MD [facility] None    ------------------------------------------------------------------------------------------------------------------   HPI BP (!) 160/80   Pulse 67   Temp (!) 97.5 F (36.4 C)   Ht 5\' 2"  (1.575 m)   Wt 140 lb (63.5 kg)   SpO2 96%   BMI 25.61 kg/m   85 y.o.female presents for evaluation of 2 weeks of dysuria, urgency, frequency, urge incontinence worse than usual. She denies hematuria, fever/chills. She does endorse bilateral back pain. She endorses this feels very similar to previous UTI, last several years ago. She has been pushing fluids. Hasn't tried any other interventions.   She denies vaginal discharge, no history of STIs, she is widowed and not sexually active. Last pelvic remote, but denies hx of organ prolapse  Past Medical History:  Diagnosis Date  . Anemia   . Anxiety   . Arthritis   . Constipation   . Depression   . GERD (gastroesophageal reflux disease)   . History of blood transfusion   . History of toe surgery left  . Hyperlipidemia   . Hypertension   . Hypothyroidism   . Insomnia   . Radiculopathy 02/06/2016  . Thyroid  disease   . Type II or unspecified type diabetes mellitus with renal manifestations, not stated as uncontrolled(250.40)    stage II, does not take medication  . Wears glasses      Allergies  Allergen Reactions  . Codeine Rash    rash  . Vasotec [Enalapril] Other (See Comments)    cough  . Augmentin [Amoxicillin-Pot Clavulanate] Other (See Comments)    vaginitis  . Hytrin [Terazosin] Other (See Comments)    incontinence   . Oxycodone Rash    rash  . Procardia [Nifedipine] Other (See Comments)    edema    Current Outpatient Medications on File Prior to Visit  Medication Sig  . aspirin 81 MG tablet Take 81 mg by mouth daily.  [name] Kitchen atenolol (TENORMIN) 100 MG tablet TAKE 1 TABLET BY MOUTH ONCE DAILY FOR BLOOD PRESSURE  . atorvastatin (LIPITOR) 80 MG tablet TAKE 1 TABLET BY MOUTH EVERY DAY  . Biotin 10 MG CAPS Take 1 tablet by mouth daily.   . cholecalciferol (VITAMIN D) 1000 UNITS tablet Take 1,000 Units by mouth daily.  . citalopram (CELEXA) 20 MG tablet TAKE 1 TABLET BY MOUTH ONCE DAILY FOR MOOD  . docusate sodium (COLACE) 100 MG capsule Take 1 capsule (100 mg total) by mouth 3 (three) times daily as needed.  . furosemide (LASIX) 40 MG tablet take 1 tablet by mouth once daily  . glucose blood test strip Check blood sugar 1 time daily-DX-E11.22.  [name] Kitchen  hydrOXYzine (ATARAX/VISTARIL) 10 MG tablet Take 1 tablet 3 x /day if needed for Nerves  . lansoprazole (PREVACID) 15 MG capsule Take 15 mg by mouth daily at 12 noon.  [name] Kitchen levothyroxine (SYNTHROID, LEVOTHROID) 100 MCG tablet take 1 tablet by mouth every morning  . losartan (COZAAR) 100 MG tablet TAKE 1 TABLET BY MOUTH ONCE DAILY  . mexiletine (MEXITIL) 150 MG capsule TAKE 1 CAPSULE BY MOUTH THREE TIMES DAILY  . minoxidil (LONITEN) 10 MG tablet Take 1/2 to 1 tablet daily for BP  . Multiple Vitamins-Minerals (MULTIVITAMIN WITH MINERALS) tablet Take 1 tablet by mouth daily.  . ondansetron (ZOFRAN) 4 MG tablet Take 1 tablet (4 mg total) by mouth  every 8 (eight) hours as needed for nausea or vomiting.  . prednisoLONE acetate (PRED FORTE) 1 % ophthalmic suspension SHAKE LIQUID AND INSTILL 1 DROP IN BOTH EYES FOUR TIMES DAILY  . predniSONE (DELTASONE) 20 MG tablet 1 tab 3 x day for 3 days, then 1 tab 2 x day for 3 days, then 1 tab 1 x day for 5 days  . PRODIGY LANCETS 28G MISC Check blood sugar 1 time daily-[id number].  . propranolol ER (INDERAL LA) 60 MG 24 hr capsule Take 1 capsule (60 mg total) by mouth daily.  . trazodone (DESYREL) 300 MG tablet TAKE 1 TABLET BY MOUTH AT BEDTIME  . trazodone (DESYREL) 300 MG tablet TAKE 1 TABLET BY MOUTH AT BEDTIME  . verapamil (VERELAN PM) 120 MG 24 hr capsule TAKE ONE CAPSULE BY MOUTH TWICE DAILY WITH FOOD   No current facility-administered medications on file prior to visit.     ROS: all negative except above.   Physical Exam:  There were no vitals taken for this visit.  General Appearance: Well nourished, in no apparent distress. Eyes: conjunctiva no swelling or erythema ENT/Mouth: Hearing normal.  Neck: Supple, thyroid normal.  Respiratory: Respiratory effort normal, BS equal bilaterally without rales, rhonchi, wheezing or stridor.  Cardio: RRR with no MRGs.  Abdomen: Soft, + BS.  Non tender, no guarding, rebound, hernias, masses. No CVA tenderness Lymphatics: Non tender without lymphadenopathy.  Musculoskeletal: normal gait.  Skin: Warm, dry without rashes, lesions, ecchymosis.  Psych: Awake and oriented X 3, normal affect, Insight and Judgment appropriate.     Dan Maker, NP 3:14 PM [facility]

## 2018-12-20 NOTE — Patient Instructions (Addendum)
Can pick up Azo from the pharmacy and take tonight IF NEEDED     Urinary Tract Infection, Adult  A urinary tract infection (UTI) is an infection of any part of the urinary tract. The urinary tract includes the kidneys, ureters, bladder, and urethra. These organs make, store, and get rid of urine in the body. Your health care provider may use other names to describe the infection. An upper UTI affects the ureters and kidneys (pyelonephritis). A lower UTI affects the bladder (cystitis) and urethra (urethritis). What are the causes? Most urinary tract infections are caused by bacteria in your genital area, around the entrance to your urinary tract (urethra). These bacteria grow and cause inflammation of your urinary tract. What increases the risk? You are more likely to develop this condition if:  You have a urinary catheter that stays in place (indwelling).  You are not able to control when you urinate or have a bowel movement (you have incontinence).  You are female and you: ? Use a spermicide or diaphragm for birth control. ? Have low estrogen levels. ? Are pregnant.  You have certain genes that increase your risk (genetics).  You are sexually active.  You take antibiotic medicines.  You have a condition that causes your flow of urine to slow down, such as: ? An enlarged prostate, if you are female. ? Blockage in your urethra (stricture). ? A kidney stone. ? A nerve condition that affects your bladder control (neurogenic bladder). ? Not getting enough to drink, or not urinating often.  You have certain medical conditions, such as: ? Diabetes. ? A weak disease-fighting system (immunesystem). ? Sickle cell disease. ? Gout. ? Spinal cord injury. What are the signs or symptoms? Symptoms of this condition include:  Needing to urinate right away (urgently).  Frequent urination or passing small amounts of urine frequently.  Pain or burning with urination.  Blood in the  urine.  Urine that smells bad or unusual.  Trouble urinating.  Cloudy urine.  Vaginal discharge, if you are female.  Pain in the abdomen or the lower back. You may also have:  Vomiting or a decreased appetite.  Confusion.  Irritability or tiredness.  A fever.  Diarrhea. The first symptom in older adults may be confusion. In some cases, they may not have any symptoms until the infection has worsened. How is this diagnosed? This condition is diagnosed based on your medical history and a physical exam. You may also have other tests, including:  Urine tests.  Blood tests.  Tests for sexually transmitted infections (STIs). If you have had more than one UTI, a cystoscopy or imaging studies may be done to determine the cause of the infections. How is this treated? Treatment for this condition includes:  Antibiotic medicine.  Over-the-counter medicines to treat discomfort.  Drinking enough water to stay hydrated. If you have frequent infections or have other conditions such as a kidney stone, you may need to see a health care provider who specializes in the urinary tract (urologist). In rare cases, urinary tract infections can cause sepsis. Sepsis is a life-threatening condition that occurs when the body responds to an infection. Sepsis is treated in the hospital with IV antibiotics, fluids, and other medicines. Follow these instructions at home:  Medicines  Take over-the-counter and prescription medicines only as told by your health care provider.  If you were prescribed an antibiotic medicine, take it as told by your health care provider. Do not stop using the antibiotic even if you  start to feel better. General instructions  Make sure you: ? Empty your bladder often and completely. Do not hold urine for long periods of time. ? Empty your bladder after sex. ? Wipe from front to back after a bowel movement if you are female. Use each tissue one time when you  wipe.  Drink enough fluid to keep your urine pale yellow.  Keep all follow-up visits as told by your health care provider. This is important. Contact a health care provider if:  Your symptoms do not get better after 1-2 days.  Your symptoms go away and then return. Get help right away if you have:  Severe pain in your back or your lower abdomen.  A fever.  Nausea or vomiting. Summary  A urinary tract infection (UTI) is an infection of any part of the urinary tract, which includes the kidneys, ureters, bladder, and urethra.  Most urinary tract infections are caused by bacteria in your genital area, around the entrance to your urinary tract (urethra).  Treatment for this condition often includes antibiotic medicines.  If you were prescribed an antibiotic medicine, take it as told by your health care provider. Do not stop using the antibiotic even if you start to feel better.  Keep all follow-up visits as told by your health care provider. This is important. This information is not intended to replace advice given to you by your health care provider. Make sure you discuss any questions you have with your health care provider. Document Released: 08/06/2005 Document Revised: 05/06/2018 Document Reviewed: 05/06/2018 Elsevier Interactive Patient Education  2019 [employer]   Ciprofloxacin tablets What is this medicine? CIPROFLOXACIN (sip roe FLOX a sin) is a quinolone antibiotic. It is used to treat certain kinds of bacterial infections. It will not work for colds, flu, or other viral infections. This medicine may be used for other purposes; ask your health care provider or pharmacist if you have questions. COMMON BRAND NAME(S): Cipro What should I tell my health care provider before I take this medicine? They need to know if you have any of these conditions: -bone problems -diabetes -heart disease -high blood pressure -history of irregular heartbeat -history of low levels of  potassium in the blood -joint problems -kidney disease -liver disease -mental illness -myasthenia gravis -seizures -tendon problems -tingling of the fingers or toes, or other nerve disorder -an unusual or allergic reaction to ciprofloxacin, other antibiotics or medicines, foods, dyes, or preservatives -pregnant or trying to get pregnant -breast-feeding How should I use this medicine? Take this medicine by mouth with a full glass of water. Follow the directions on the prescription label. You can take it with or without food. If it upsets your stomach, take it with food. Take your medicine at regular intervals. Do not take your medicine more often than directed. Take all of your medicine as directed even if you think you are better. Do not skip doses or stop your medicine early. Avoid antacids, aluminum, calcium, iron, magnesium, and zinc products for 6 hours before and 2 hours after taking a dose of this medicine. A special MedGuide will be given to you by the pharmacist with each prescription and refill. Be sure to read this information carefully each time. Talk to your pediatrician regarding the use of this medicine in children. Special care may be needed. Overdosage: If you think you have taken too much of this medicine contact a poison control center or emergency room at once. NOTE: This medicine is only for you.  Do not share this medicine with others. What if I miss a dose? If you miss a dose, take it as soon as you can. If it is almost time for your next dose, take only that dose. Do not take double or extra doses. What may interact with this medicine? Do not take this medicine with any of the following medications: -cisapride -dofetilide -dronedarone -flibanserin -lomitapide -pimozide -thioridazine -tizanidine -ziprasidone This medicine may also interact with the following medications: -antacids -birth control pills -caffeine -certain medicines for diabetes, like glipizide,  glyburide, or insulin -certain medicines that treat or prevent blood clots like warfarin -clozapine -cyclosporine -didanosine buffered tablets or powder -duloxetine -lanthanum carbonate -lidocaine -methotrexate -multivitamins -NSAIDS, medicines for pain and inflammation, like ibuprofen or naproxen -olanzapine -omeprazole -other medicines that prolong the QT interval (cause an abnormal heart rhythm) -phenytoin -probenecid -ropinirole -sevelamer -sildenafil -sucralfate -theophylline -zolpidem This list may not describe all possible interactions. Give your health care provider a list of all the medicines, herbs, non-prescription drugs, or dietary supplements you use. Also tell them if you smoke, drink alcohol, or use illegal drugs. Some items may interact with your medicine. What should I watch for while using this medicine? Tell your doctor or healthcare professional if your symptoms do not start to get better or if they get worse. Do not treat diarrhea with over the counter products. Contact your doctor if you have diarrhea that lasts more than 2 days or if it is severe and watery. Check with your doctor or health care professional if you get an attack of severe diarrhea, nausea and vomiting, or if you sweat a lot. The loss of too much body fluid can make it dangerous for you to take this medicine. This medicine may affect blood sugar levels. If you have diabetes, check with your doctor or health care professional before you change your diet or the dose of your diabetic medicine. You may get drowsy or dizzy. Do not drive, use machinery, or do anything that needs mental alertness until you know how this medicine affects you. Do not sit or stand up quickly, especially if you are an older patient. This reduces the risk of dizzy or fainting spells. This medicine can make you more sensitive to the sun. Keep out of the sun. If you cannot avoid being in the sun, wear protective clothing and use  a sunscreen. Do not use sun lamps or tanning beds/booths. What side effects may I notice from receiving this medicine? Side effects that you should report to your doctor or health care professional as soon as possible: -allergic reactions like skin rash or hives, swelling of the face, lips, or tongue -anxious -bloody or watery diarrhea -confusion -depressed mood -fast, irregular heartbeat -fever -hallucination, loss of contact with reality -joint, muscle, or tendon pain or swelling -loss of memory -pain, tingling, numbness in the hands or feet -seizures -signs and symptoms of aortic dissection such as sudden chest, stomach, or back pain -signs and symptoms of high blood sugar such as dizziness; dry mouth; dry skin; fruity breath; nausea; stomach pain; increased hunger or thirst; increased urination -signs and symptoms of liver injury like dark yellow or brown urine; general ill feeling or flu-like symptoms; light-colored stools; loss of appetite; nausea; right upper belly pain; unusually weak or tired; yellowing of the eyes or skin -signs and symptoms of low blood sugar such as feeling anxious; confusion; dizziness; increased hunger; unusually weak or tired; sweating; shakiness; cold; irritable; headache; blurred vision; fast heartbeat; loss of  consciousness; pale skin -suicidal thoughts or other mood changes -sunburn -unusually weak or tired Side effects that usually do not require medical attention (report to your doctor or health care professional if they continue or are bothersome): -dry mouth -headache -nausea -trouble sleeping This list may not describe all possible side effects. Call your doctor for medical advice about side effects. You may report side effects to FDA at 1-800-FDA-1088. Where should I keep my medicine? Keep out of the reach of children. Store at room temperature below 30 degrees C (86 degrees F). Keep container tightly closed. Throw away any unused medicine after  the expiration date. NOTE: This sheet is a summary. It may not cover all possible information. If you have questions about this medicine, talk to your doctor, pharmacist, or health care provider.  2019 Elsevier/Gold Standard (2018-06-16 15:54:38)

## 2018-12-22 LAB — URINE CULTURE
MICRO NUMBER:: 179221
SPECIMEN QUALITY:: ADEQUATE

## 2018-12-22 LAB — CULTURE INDICATED

## 2018-12-22 LAB — URINALYSIS W MICROSCOPIC + REFLEX CULTURE
Bacteria, UA: NONE SEEN /HPF
Bilirubin Urine: NEGATIVE
Glucose, UA: NEGATIVE
Hgb urine dipstick: NEGATIVE
Hyaline Cast: NONE SEEN /LPF
Ketones, ur: NEGATIVE
Nitrites, Initial: NEGATIVE
PH: 6.5 (ref 5.0–8.0)
Specific Gravity, Urine: 1.014 (ref 1.001–1.03)
Squamous Epithelial / HPF: NONE SEEN /HPF (ref <=5)
WBC, UA: >=60 /HPF — AB (ref 0–5)

## 2019-01-03 ENCOUNTER — Encounter: Payer: Self-pay | Admitting: Internal Medicine

## 2019-01-03 NOTE — Patient Instructions (Signed)

## 2019-01-03 NOTE — Progress Notes (Signed)
This very nice 85 y.o. WWFpresents for 3 month follow up with HTN, HLD, T2_NIDDM / CKD3 and Vitamin D Deficiency.      Patient is treated for HTN (1987)  & BP has been controlled at home. Today's BP was initially elevated and rechecked at goal - 138/86. Patient has had no complaints of any cardiac type chest pain, palpitations, dyspnea / orthopnea / PND, dizziness, claudication, or dependent edema.     Hyperlipidemia is controlled with diet & meds. Patient denies myalgias or other med SE's. Last Lipids were at goal: Lab Results  Component Value Date   CHOL 141 10/04/2018   HDL 58 10/04/2018   LDLCALC 64 10/04/2018   TRIG 104 10/04/2018   CHOLHDL 2.4 10/04/2018      Also, the patient has history of T2_NIDDM  (A1c 6.5% / 2008, then A1c 7.1% /2016 & A1c 6.7% / 2017)  and has had no symptoms of reactive hypoglycemia, diabetic polys, paresthesias or visual blurring.  Last A1c was not at goal: Lab Results  Component Value Date   HGBA1C 7.0 (H) 10/04/2018      Patient has been on Thyroid Replacement since 2012.     Further, the patient also has history of Vitamin D Deficiency("24" / 2009) and supplements vitamin D without any suspected side-effects. Last vitamin D was at goal: Lab Results  Component Value Date   VD25OH 64 10/04/2018   Current Outpatient Medications on File Prior to Visit  Medication Sig  . aspirin 81 MG tablet Take 81 mg by mouth daily.  [name] Kitchen atenolol (TENORMIN) 100 MG tablet TAKE 1 TABLET BY MOUTH ONCE DAILY FOR BLOOD PRESSURE  . atorvastatin (LIPITOR) 80 MG tablet TAKE 1 TABLET BY MOUTH EVERY DAY  . Biotin 10 MG CAPS Take 1 tablet by mouth daily.   . cholecalciferol (VITAMIN D) 1000 UNITS tablet Take 1,000 Units by mouth daily.  . citalopram (CELEXA) 20 MG tablet TAKE 1 TABLET BY MOUTH ONCE DAILY FOR MOOD  . docusate sodium (COLACE) 100 MG capsule Take 1 capsule (100 mg total) by mouth 3 (three) times daily as needed.  . furosemide (LASIX) 40 MG tablet take 1 tablet  by mouth once daily  . glucose blood test strip Check blood sugar 1 time daily-DX-E11.22.  . hydrOXYzine (ATARAX/VISTARIL) 10 MG tablet Take 1 tablet 3 x /day if needed for Nerves  . levothyroxine (SYNTHROID, LEVOTHROID) 100 MCG tablet take 1 tablet by mouth every morning  . losartan (COZAAR) 100 MG tablet TAKE 1 TABLET BY MOUTH ONCE DAILY  . mexiletine (MEXITIL) 150 MG capsule TAKE 1 CAPSULE BY MOUTH THREE TIMES DAILY  . minoxidil (LONITEN) 10 MG tablet Take 1/2 to 1 tablet daily for BP  . Multiple Vitamins-Minerals (MULTIVITAMIN WITH MINERALS) tablet Take 1 tablet by mouth daily.  . prednisoLONE acetate (PRED FORTE) 1 % ophthalmic suspension SHAKE LIQUID AND INSTILL 1 DROP IN BOTH EYES FOUR TIMES DAILY (Patient taking differently: as needed. SHAKE WELL)  . PRODIGY LANCETS 28G MISC Check blood sugar 1 time daily-DX-E11.22.  . propranolol ER (INDERAL LA) 60 MG 24 hr capsule Take 1 capsule (60 mg total) by mouth daily.  . trazodone (DESYREL) 300 MG tablet TAKE 1 TABLET BY MOUTH AT BEDTIME  . verapamil (VERELAN PM) 120 MG 24 hr capsule TAKE ONE CAPSULE BY MOUTH TWICE DAILY WITH FOOD   No current facility-administered medications on file prior to visit.    Allergies  Allergen Reactions  . Codeine Rash  rash  . Vasotec [Enalapril] Other (See Comments)    cough  . Augmentin [Amoxicillin-Pot Clavulanate] Other (See Comments)    vaginitis  . Hytrin [Terazosin] Other (See Comments)    incontinence   . Oxycodone Rash    rash  . Procardia [Nifedipine] Other (See Comments)    edema   PMHx:   Past Medical History:  Diagnosis Date  . Anemia   . Anxiety   . Arthritis   . Constipation   . Depression   . GERD (gastroesophageal reflux disease)   . History of blood transfusion   . History of toe surgery left  . Hyperlipidemia   . Hypertension   . Hypothyroidism   . Insomnia   . Radiculopathy 02/06/2016  . Thyroid disease   . Type II or unspecified type diabetes mellitus with renal  manifestations, not stated as uncontrolled(250.40)    stage II, does not take medication  . Wears glasses    Immunization History  Administered Date(s) Administered  . Influenza, High Dose Seasonal PF 09/05/2014, 10/02/2015, 07/24/2016, 09/16/2017, 10/04/2018  . Influenza-Unspecified 09/10/2013  . Pneumococcal Conjugate-13 07/24/2016  . Pneumococcal-Unspecified 11/26/2012  . Td 11/26/2012  . Tdap 08/24/2014   Past Surgical History:  Procedure Laterality Date  . ABDOMINAL HYSTERECTOMY    . APPENDECTOMY    . BACK SURGERY  02/06/2016  . BREAST BIOPSY     core biopsy 1995  . CARPAL TUNNEL RELEASE Left 07/08/2016   Procedure: CARPAL TUNNEL RELEASE;  Surgeon: Jones Broom, MD;  Location: [facility];  Service: Orthopedics;  Laterality: Left;  Left carpal tunnel release  . CEREBRAL ANEURYSM REPAIR  09/2007   coil ablation  . CHOLECYSTECTOMY  2012   lap choli  . COLONOSCOPY    . GANGLION CYST EXCISION Left 2008   lt ring finger  . HAMMER TOE SURGERY  2008   osteotomy=left foot  . JOINT REPLACEMENT Left 2012   left total knee  . SHOULDER ARTHROSCOPY WITH ROTATOR CUFF REPAIR AND SUBACROMIAL DECOMPRESSION Right 01/29/2015   Procedure: RIGHT SHOULDER ARTHROSCOPY WITH DEBRIDEMENT ROTATOR CUFF TEAR, SUBACROMIAL DECOMPRESSION, DISTAL CLAVICAL RESECTION;  Surgeon: Jones Broom, MD;  Location: [facility];  Service: Orthopedics;  Laterality: Right;  Right shoulder arthroscopy debridement rotatoro cuff tear, subacromial decompression, distal clavical resection  . TONSILLECTOMY    . TOTAL KNEE ARTHROPLASTY Right 2005   right  . TOTAL KNEE REVISION Right 2007   right   FHx:    Reviewed / unchanged  SHx:    Reviewed / unchanged   Systems Review:  Constitutional: Denies fever, chills, wt changes, headaches, insomnia, fatigue, night sweats, change in appetite. Eyes: Denies redness, blurred vision, diplopia, discharge, itchy, watery eyes.  ENT: Denies  discharge, congestion, post nasal drip, epistaxis, sore throat, earache, hearing loss, dental pain, tinnitus, vertigo, sinus pain, snoring.  CV: Denies chest pain, palpitations, irregular heartbeat, syncope, dyspnea, diaphoresis, orthopnea, PND, claudication or edema. Respiratory: denies cough, dyspnea, DOE, pleurisy, hoarseness, laryngitis, wheezing.  Gastrointestinal: Denies dysphagia, odynophagia, heartburn, reflux, water brash, abdominal pain or cramps, nausea, vomiting, bloating, diarrhea, constipation, hematemesis, melena, hematochezia  or hemorrhoids. Genitourinary: Denies dysuria, frequency, urgency, nocturia, hesitancy, discharge, hematuria or flank pain. Musculoskeletal: Denies arthralgias, myalgias, stiffness, jt. swelling, pain, limping or strain/sprain.  Skin: Denies pruritus, rash, hives, warts, acne, eczema or change in skin lesion(s). Neuro: No weakness, tremor, incoordination, spasms, paresthesia or pain. Psychiatric: Denies confusion, memory loss or sensory loss. Endo: Denies change in weight, skin or hair change.  Heme/Lymph: No excessive bleeding, bruising or enlarged lymph nodes.  Physical Exam  BP 138/86   Pulse 68   Temp (!) 97.4 F (36.3 C)   Resp 16   Ht 5\' 2"  (1.575 m)   Wt 143 lb 9.6 oz (65.1 kg)   BMI 26.26 kg/m   Appears  well nourished, well groomed  and in no distress.  Eyes: PERRLA, EOMs, conjunctiva no swelling or erythema. Sinuses: No frontal/maxillary tenderness ENT/Mouth: EAC's clear, TM's nl w/o erythema, bulging. Nares clear w/o erythema, swelling, exudates. Oropharynx clear without erythema or exudates. Oral hygiene is good. Tongue normal, non obstructing. Hearing intact.  Neck: Supple. Thyroid not palpable. Car 2+/2+ without bruits, nodes or JVD. Chest: Respirations nl with BS clear & equal w/o rales, rhonchi, wheezing or stridor.  Cor: Heart sounds normal w/ regular rate and rhythm without sig. murmurs, gallops, clicks or rubs. Peripheral pulses  normal and equal  without edema.  Abdomen: Soft & bowel sounds normal. Non-tender w/o guarding, rebound, hernias, masses or organomegaly.  Lymphatics: Unremarkable.  Musculoskeletal: Full ROM all peripheral extremities, joint stability, 5/5 strength and normal gait.  Skin: Warm, dry without exposed rashes, lesions or ecchymosis apparent.  Neuro: Cranial nerves intact, reflexes equal bilaterally. Sensory-motor testing grossly intact. Tendon reflexes grossly intact.  Pysch: Alert & oriented x 3.  Insight and judgement nl & appropriate. No ideations.  Assessment and Plan:  1. Essential hypertension  - Continue medication, monitor blood pressure at home.  - Continue DASH diet.  Reminder to go to the ER if any CP,  SOB, nausea, dizziness, severe HA, changes vision/speech.  - CBC with Differential/Platelet - COMPLETE METABOLIC PANEL WITH GFR - Magnesium - TSH  2. Hyperlipidemia, mixed  - Continue diet/meds, exercise,& lifestyle modifications.  - Continue monitor periodic cholesterol/liver & renal functions   - Lipid panel - TSH  3. Type 2 diabetes mellitus with stage 3 chronic kidney disease, without long-term current use of insulin (HCC)  - Continue diet, exercise  - Lifestyle modifications.  - Monitor appropriate labs.  - Hemoglobin A1c - Insulin, random  4. Vitamin D deficiency  - Continue supplementation.   - VITAMIN D 25 Hydroxyl  5. Hypothyroidism  - TSH  6. Medication management  - CBC with Differential/Platelet - COMPLETE METABOLIC PANEL WITH GFR - Magnesium - Lipid panel - TSH - Hemoglobin A1c - Insulin, random - VITAMIN D 25 Hydroxyl      Discussed  regular exercise, BP monitoring, weight control to achieve/maintain BMI less than 25 and discussed med and SE's. Recommended labs to assess and monitor clinical status with further disposition pending results of labs. Over 30 minutes of exam, counseling, chart review was performed.

## 2019-01-04 ENCOUNTER — Encounter: Payer: Self-pay | Admitting: Internal Medicine

## 2019-01-04 ENCOUNTER — Ambulatory Visit (INDEPENDENT_AMBULATORY_CARE_PROVIDER_SITE_OTHER): Payer: Medicare Other | Admitting: Internal Medicine

## 2019-01-04 VITALS — BP 138/86 | HR 68 | Temp 97.4°F | Resp 16 | Ht 62.0 in | Wt 143.6 lb

## 2019-01-04 DIAGNOSIS — E782 Mixed hyperlipidemia: Secondary | ICD-10-CM | POA: Diagnosis not present

## 2019-01-04 DIAGNOSIS — E1122 Type 2 diabetes mellitus with diabetic chronic kidney disease: Secondary | ICD-10-CM | POA: Diagnosis not present

## 2019-01-04 DIAGNOSIS — E039 Hypothyroidism, unspecified: Secondary | ICD-10-CM | POA: Diagnosis not present

## 2019-01-04 DIAGNOSIS — E559 Vitamin D deficiency, unspecified: Secondary | ICD-10-CM | POA: Diagnosis not present

## 2019-01-04 DIAGNOSIS — Z79899 Other long term (current) drug therapy: Secondary | ICD-10-CM | POA: Diagnosis not present

## 2019-01-04 DIAGNOSIS — N183 Chronic kidney disease, stage 3 unspecified: Secondary | ICD-10-CM

## 2019-01-04 DIAGNOSIS — I10 Essential (primary) hypertension: Secondary | ICD-10-CM | POA: Diagnosis not present

## 2019-01-05 ENCOUNTER — Other Ambulatory Visit: Payer: Self-pay | Admitting: Internal Medicine

## 2019-01-05 DIAGNOSIS — N183 Chronic kidney disease, stage 3 unspecified: Secondary | ICD-10-CM

## 2019-01-05 DIAGNOSIS — E1122 Type 2 diabetes mellitus with diabetic chronic kidney disease: Secondary | ICD-10-CM

## 2019-01-05 LAB — INSULIN, RANDOM: Insulin: 4.2 u[IU]/mL

## 2019-01-05 LAB — COMPLETE METABOLIC PANEL WITH GFR
AG Ratio: 2 (calc) (ref 1.0–2.5)
ALT: 12 U/L (ref 6–29)
AST: 18 U/L (ref 10–35)
Albumin: 4.3 g/dL (ref 3.6–5.1)
Alkaline phosphatase (APISO): 70 U/L (ref 37–153)
BUN/Creatinine Ratio: 14 (calc) (ref 6–22)
BUN: 14 mg/dL (ref 7–25)
CO2: 32 mmol/L (ref 20–32)
CREATININE: 1.03 mg/dL — AB (ref 0.60–0.88)
Calcium: 9.8 mg/dL (ref 8.6–10.4)
Chloride: 103 mmol/L (ref 98–110)
GFR, Est African American: 58 mL/min/{1.73_m2} — ABNORMAL LOW (ref >=60)
GFR, Est Non African American: 50 mL/min/{1.73_m2} — ABNORMAL LOW (ref >=60)
Globulin: 2.1 g/dL (calc) (ref 1.9–3.7)
Glucose, Bld: 111 mg/dL — ABNORMAL HIGH (ref 65–99)
Potassium: 4 mmol/L (ref 3.5–5.3)
Sodium: 142 mmol/L (ref 135–146)
Total Bilirubin: 0.4 mg/dL (ref 0.2–1.2)
Total Protein: 6.4 g/dL (ref 6.1–8.1)

## 2019-01-05 LAB — LIPID PANEL
Cholesterol: 152 mg/dL (ref <200)
HDL: 64 mg/dL (ref >=50)
LDL CHOLESTEROL (CALC): 66 mg/dL
Non-HDL Cholesterol (Calc): 88 mg/dL (calc) (ref <130)
Total CHOL/HDL Ratio: 2.4 (calc) (ref <5.0)
Triglycerides: 135 mg/dL (ref <150)

## 2019-01-05 LAB — HEMOGLOBIN A1C
Hgb A1c MFr Bld: 7.6 % of total Hgb — ABNORMAL HIGH (ref <5.7)
Mean Plasma Glucose: 171 (calc)
eAG (mmol/L): 9.5 (calc)

## 2019-01-05 LAB — CBC WITH DIFFERENTIAL/PLATELET
Absolute Monocytes: 640 cells/uL (ref 200–950)
BASOS PCT: 1.2 %
Basophils Absolute: 94 cells/uL (ref 0–200)
Eosinophils Absolute: 242 cells/uL (ref 15–500)
Eosinophils Relative: 3.1 %
HCT: 35 % (ref 35.0–45.0)
Hemoglobin: 12 g/dL (ref 11.7–15.5)
Lymphs Abs: 2363 cells/uL (ref 850–3900)
MCH: 31.6 pg (ref 27.0–33.0)
MCHC: 34.3 g/dL (ref 32.0–36.0)
MCV: 92.1 fL (ref 80.0–100.0)
MPV: 10.2 fL (ref 7.5–12.5)
Monocytes Relative: 8.2 %
Neutro Abs: 4462 cells/uL (ref 1500–7800)
Neutrophils Relative %: 57.2 %
Platelets: 325 10*3/uL (ref 140–400)
RBC: 3.8 10*6/uL (ref 3.80–5.10)
RDW: 11.9 % (ref 11.0–15.0)
Total Lymphocyte: 30.3 %
WBC: 7.8 10*3/uL (ref 3.8–10.8)

## 2019-01-05 LAB — VITAMIN D 25 HYDROXY (VIT D DEFICIENCY, FRACTURES): VIT D 25 HYDROXY: 53 ng/mL (ref 30–100)

## 2019-01-05 LAB — TSH: TSH: 1.08 mIU/L (ref 0.40–4.50)

## 2019-01-05 LAB — MAGNESIUM: Magnesium: 2 mg/dL (ref 1.5–2.5)

## 2019-01-05 MED ORDER — METFORMIN HCL ER 500 MG PO TB24: ORAL_TABLET | ORAL | 3 refills | Status: AC

## 2019-01-10 ENCOUNTER — Telehealth: Payer: Self-pay | Admitting: *Deleted

## 2019-01-10 ENCOUNTER — Other Ambulatory Visit: Payer: Self-pay | Admitting: Internal Medicine

## 2019-01-10 NOTE — Telephone Encounter (Signed)
Patient called and reported she is taking Metformin 500 mg XR 2 tablets twice a day.  Per Dr Oneta Rack, reduce the dose to 1 tablet twice daily and take 2 Imodium with the Metformin. Patient is aware.

## 2019-01-13 ENCOUNTER — Other Ambulatory Visit: Payer: Self-pay | Admitting: Adult Health

## 2019-01-13 ENCOUNTER — Telehealth: Payer: Self-pay | Admitting: *Deleted

## 2019-01-13 ENCOUNTER — Other Ambulatory Visit: Payer: Self-pay | Admitting: Internal Medicine

## 2019-01-13 NOTE — Telephone Encounter (Signed)
Patient called and reported she is taking Metformin XL 500 mg 2 per day, as instructed.  She states her blood sugar has risen to 240 and the Imodium, with each dose, is causing her to be constipated.  Per Dr Oneta Rack, continue 2 tablets daily, reduce the Imodium to 1 tablet daily and keep a record to her glucose readings. If her readings stay above 180, she can increase her meformin to 3 times a day.  Patient is aware.

## 2019-01-24 ENCOUNTER — Other Ambulatory Visit: Payer: Self-pay | Admitting: Internal Medicine

## 2019-01-24 ENCOUNTER — Other Ambulatory Visit: Payer: Self-pay | Admitting: Physician Assistant

## 2019-02-11 ENCOUNTER — Other Ambulatory Visit: Payer: Self-pay | Admitting: Internal Medicine

## 2019-02-11 DIAGNOSIS — H1013 Acute atopic conjunctivitis, bilateral: Secondary | ICD-10-CM

## 2019-02-11 DIAGNOSIS — J301 Allergic rhinitis due to pollen: Secondary | ICD-10-CM

## 2019-02-11 MED ORDER — PREDNISOLONE ACETATE 1 % OP SUSP: OPHTHALMIC | 0 refills | Status: DC

## 2019-02-21 ENCOUNTER — Other Ambulatory Visit: Payer: Medicare Other | Admitting: Internal Medicine

## 2019-02-21 DIAGNOSIS — I10 Essential (primary) hypertension: Secondary | ICD-10-CM | POA: Diagnosis not present

## 2019-02-21 DIAGNOSIS — E1122 Type 2 diabetes mellitus with diabetic chronic kidney disease: Secondary | ICD-10-CM

## 2019-02-21 DIAGNOSIS — N183 Chronic kidney disease, stage 3 unspecified: Secondary | ICD-10-CM

## 2019-02-21 DIAGNOSIS — F5101 Primary insomnia: Secondary | ICD-10-CM | POA: Diagnosis not present

## 2019-02-21 MED ORDER — GABAPENTIN 300 MG PO CAPS: ORAL_CAPSULE | ORAL | 0 refills | Status: DC

## 2019-02-21 NOTE — Progress Notes (Signed)
[facility]   Lucky CowboyWilliam Val Farnam, [name]    Dyanne CarrelAmanda R. Steffanie Dunnollier, [name]      Judd GaudierAshley Corbett, DNP [facility]                [address]                [place], [place] [id number] Telephone [phone] Telefax [phone] _________________________________  THIS ENCOUNTER IS A VIRTUAL VISIT DUE TO COVID-19 - PATIENT WAS NOT SEEN IN THE OFFICE.  PATIENT HAS CONSENTED TO VIRTUAL VISIT / TELEMEDICINE VISIT  Virtual Visit via telephone Note  I connected with the patient  on 02/21/2019 02/21/2019  by telephone.  I verified that I am speaking with the correct person using two identifiers.    I discussed the limitations of evaluation and management by telemedicine and the availability of in person appointments. The patient expressed understanding and agreed to proceed.  History of Present Illness:     This very nice 85 yo WWF with HTN & T2_DM called with also with c/o insomnia not improved on Trazodone 300 mg. She has not checked BP's, but generally they have been normal in the office and she denies any c/o HA's, postural dizziness, CP, palpitations , dyspnea and she has no dependent edema since stopping her diuretic. Her sugars have improved since starting Metformin - usually less than 150 mg% and she denies hypoglycemic sx's,  diabetic polys, paresthesias or visual blurring.   Medications  .  levothyroxine  100 MCG tablet, TAKE 1 TABLET  EVERY MORNING .  metFORMIN-XR) 500 MG 24 hr tablet, Take 2 tablets 2 x /day after meals  for Diabetes .  atenolol (100 MG tablet, TAKE 1 TABLET ONCE DAILY FOR BLOOD PRESSURE .  atorvastatin (LIPITOR) 80 MG tablet, TAKE 1 TABLET  EVERY DAY .  furosemide (LASIX) 40 MG tablet, take 1 tablet  once daily .  losartan (COZAAR) 100 MG tablet, TAKE 1 TABLET  ONCE DAILY .  mexiletine (MEXITIL) 150 MG capsule, TAKE 1 CAPSULE THREE TIMES DAILY .  minoxidil (LONITEN) 10 MG tablet, Take 1/2 to 1 tablet daily  for BP .  verapamil (VERELAN PM) 120 MG 24 hr capsule, TAKE ONE CAPSULE  TWICE DAILY WITH FOOD .  aspirin 81 MG tablet, Take  daily. .  Biotin 10 MG CAPS, Take 1 tablet daily.  [name] Kitchen.  VITAMIN D 1000 UNITS , Take 1,000 Unitsdaily. .  citalopram  20 MG tablet, TAKE 1 TABLET  EVERY DAY FOR MOOD .  COLACE 100 MG capsule, Take 1 capsule 3  times daily as needed. .  hydrOXYzine 10 MG tablet, Take 1 tablet 3 x /day if needed for Nerves .  MultiVit-Minerals  tablet, Take 1 tablet daily. [name] Kitchen.  PRED FORTE 1 % ophal susp,  1 DROP IN EYES FOUR TIMES DAILY .  trazodone  300 MG tablet, TAKE 1 TABLET  AT BEDTIME  Problem list She has Hypothyroidism; Essential hypertension; DJD; Mixed hyperlipidemia; Vitamin D deficiency; Medication management; CKD stage 3 due to type 2 diabetes mellitus (HCC); Depression, major, recurrent, in partial remission (HCC); BMI 25.0-25.9,adult; Encounter for Medicare annual wellness exam; Gastroesophageal reflux disease without esophagitis; Prediabetes; and Osteopenia on their problem list.   Observations/Objective:  General : Well sounding patient in no apparent distress HEENT: no hoarseness, no cough for duration of visit Lungs: speaks in complete sentences, no audible wheezing, no apparent distress Neurological: alert, oriented x 3 Psychiatric: pleasant, judgement appropriate  Assessment and Plan:  1. Essential hypertension  2. Primary insomnia  - gabapentin (NEURONTIN) 300 MG capsule; Take 1 capsule 2 x /day Suppertime & Bedtime for Sleep  Dispense: 60 capsule; Refill: 0  3. Type 2 diabetes mellitus with stage 3 chronic kidney disease, without long-term current use of insulin (HCC)   Follow Up Instructions:      I discussed the assessment and treatment plan with the patient. The patient was provided an opportunity to ask questions and all were answered. The patient agreed with the plan and demonstrated an understanding of the instructions.       The patient was advised  to call back or seek an in-person evaluation if the symptoms worsen or if the condition fails to improve as anticipated.  I provided 23 minutes of non-face-to-face time during this encounter.  Marinus Maw, MD

## 2019-02-28 ENCOUNTER — Other Ambulatory Visit: Payer: Self-pay | Admitting: Internal Medicine

## 2019-02-28 MED ORDER — QUETIAPINE FUMARATE 50 MG PO TABS: ORAL_TABLET | ORAL | 0 refills | Status: DC

## 2019-03-01 ENCOUNTER — Telehealth: Payer: Self-pay | Admitting: *Deleted

## 2019-03-01 NOTE — Telephone Encounter (Signed)
Patient called and reported neither Trazodone or Gabapentin have helped her to sleep. The Gabapentin even made her unable to sleep at all.  Dr Oneta Rack sent in an RX for Seroquel 50 mg to the patient's pharmacy.  She was advised to start with 1/2 to 1 tablet 1 hour prior to bedtime.

## 2019-03-02 ENCOUNTER — Telehealth: Payer: Self-pay | Admitting: *Deleted

## 2019-03-02 NOTE — Telephone Encounter (Signed)
Patient called and reported she took Seroquel 50 mg 1/2 tablet before bed last PM, but still woke up around 4:30 AM.  She is also having a problem with a jerking feeling in her legs at night. She reported her blood sugar was 315, but she had just eaten lunch.  She states one hour later, the blood sugar was 227.  Per Dr Oneta Rack, the patient was advised to increase the Seroquel 50 mg to a whole tablet, check her blood sugar before dinner tonight and prior to breakfast in the morning and report back on 03/03/2019.  Patient is aware of all.

## 2019-04-11 ENCOUNTER — Other Ambulatory Visit: Payer: Self-pay | Admitting: Physician Assistant

## 2019-04-11 ENCOUNTER — Other Ambulatory Visit: Payer: Self-pay | Admitting: Adult Health

## 2019-04-11 ENCOUNTER — Other Ambulatory Visit: Payer: Self-pay | Admitting: Internal Medicine

## 2019-04-11 DIAGNOSIS — I10 Essential (primary) hypertension: Secondary | ICD-10-CM

## 2019-04-11 MED ORDER — ATENOLOL 100 MG PO TABS: ORAL_TABLET | ORAL | 3 refills | Status: DC

## 2019-04-12 ENCOUNTER — Encounter: Payer: Self-pay | Admitting: Adult Health Nurse Practitioner

## 2019-04-12 ENCOUNTER — Other Ambulatory Visit: Payer: Self-pay

## 2019-04-12 ENCOUNTER — Ambulatory Visit (INDEPENDENT_AMBULATORY_CARE_PROVIDER_SITE_OTHER): Payer: Medicare Other | Admitting: Adult Health Nurse Practitioner

## 2019-04-12 ENCOUNTER — Ambulatory Visit: Payer: Self-pay | Admitting: Physician Assistant

## 2019-04-12 VITALS — BP 120/68 | HR 64 | Temp 97.5°F | Ht 62.0 in | Wt 137.0 lb

## 2019-04-12 DIAGNOSIS — Z0000 Encounter for general adult medical examination without abnormal findings: Secondary | ICD-10-CM

## 2019-04-12 DIAGNOSIS — D518 Other vitamin B12 deficiency anemias: Secondary | ICD-10-CM

## 2019-04-12 DIAGNOSIS — E782 Mixed hyperlipidemia: Secondary | ICD-10-CM

## 2019-04-12 DIAGNOSIS — J301 Allergic rhinitis due to pollen: Secondary | ICD-10-CM | POA: Diagnosis not present

## 2019-04-12 DIAGNOSIS — E039 Hypothyroidism, unspecified: Secondary | ICD-10-CM | POA: Diagnosis not present

## 2019-04-12 DIAGNOSIS — N183 Chronic kidney disease, stage 3 unspecified: Secondary | ICD-10-CM

## 2019-04-12 DIAGNOSIS — E559 Vitamin D deficiency, unspecified: Secondary | ICD-10-CM | POA: Diagnosis not present

## 2019-04-12 DIAGNOSIS — R6889 Other general symptoms and signs: Secondary | ICD-10-CM | POA: Diagnosis not present

## 2019-04-12 DIAGNOSIS — Z0001 Encounter for general adult medical examination with abnormal findings: Secondary | ICD-10-CM

## 2019-04-12 DIAGNOSIS — F3341 Major depressive disorder, recurrent, in partial remission: Secondary | ICD-10-CM

## 2019-04-12 DIAGNOSIS — E1122 Type 2 diabetes mellitus with diabetic chronic kidney disease: Secondary | ICD-10-CM

## 2019-04-12 DIAGNOSIS — F5101 Primary insomnia: Secondary | ICD-10-CM

## 2019-04-12 DIAGNOSIS — Z6825 Body mass index (BMI) 25.0-25.9, adult: Secondary | ICD-10-CM

## 2019-04-12 DIAGNOSIS — M8580 Other specified disorders of bone density and structure, unspecified site: Secondary | ICD-10-CM

## 2019-04-12 DIAGNOSIS — R5383 Other fatigue: Secondary | ICD-10-CM

## 2019-04-12 DIAGNOSIS — D509 Iron deficiency anemia, unspecified: Secondary | ICD-10-CM | POA: Diagnosis not present

## 2019-04-12 DIAGNOSIS — M1990 Unspecified osteoarthritis, unspecified site: Secondary | ICD-10-CM | POA: Diagnosis not present

## 2019-04-12 DIAGNOSIS — I10 Essential (primary) hypertension: Secondary | ICD-10-CM

## 2019-04-12 DIAGNOSIS — K219 Gastro-esophageal reflux disease without esophagitis: Secondary | ICD-10-CM | POA: Diagnosis not present

## 2019-04-12 DIAGNOSIS — R251 Tremor, unspecified: Secondary | ICD-10-CM

## 2019-04-12 NOTE — Progress Notes (Signed)
MEDICARE ANNUAL WELLNESS VISIT AND FOLLOW UP  Assessment:   [name] was seen today for medicare wellness and follow-up.  Diagnoses and all orders for this visit:  Encounter for Medicare annual wellness exam Yearly  Essential hypertension Hypertension Continue medication: Monitor blood pressure at home; call if consistently over 130/80 Continue DASH diet.   Reminder to go to the ER if any CP, SOB, nausea, dizziness, severe HA, changes vision/speech, left arm numbness and tingling and jaw pain. -     CBC with Differential/Platelet -     COMPLETE METABOLIC PANEL WITH GFR -     Magnesium  Mixed hyperlipidemia Continue medications: Lipitor 80mg  daily Continue low cholesterol diet and exercise.  Check lipid panel.  -     Lipid panel  Gastroesophageal reflux disease without esophagitis Doing well on current regiment Diet controlled continue to monitor for triggers  Type 2 diabetes mellitus with stage 3 chronic kidney disease, without long-term current use of insulin (HCC) Metformin 500mg , two tablets twice a day Discussed general issues about diabetes pathophysiology and management., Educational material distributed., Suggested low cholesterol diet., Encouraged aerobic exercise., Discussed foot care., Reminded to get yearly retinal exam. -     Hemoglobin A1c -     Insulin, random  CKD stage 3 due to type 2 diabetes mellitus (HCC) Increase fluids, avoid NSAIDS, monitor sugars, will monitor  Hypothyroidism, unspecified type Continue levothyroxine 100mcg Reminded to take on an empty stomach 30-3860mins before food.  -     TSH  Osteoarthritis, unspecified osteoarthritis type, unspecified site Doing well on current regiment, uses tylenol PRN  Osteopenia, unspecified location Continue Calcium and Vitamin D Supplementation DEXA UTD  Depression, major, recurrent, in partial remission (HCC) Doing well on current regiment Continue with benefit, screening completed  today  Vitamin D deficiency -     VITAMIN D 25 Hydroxy (Vit-D Deficiency, Fractures)  Allergic rhinitis due to pollen, unspecified seasonality Uses PRN OTC products Doing well at this time  Fatigue, unspecified type -     Iron,Total/Total Iron Binding Cap  Also check B12 and Vitamin D.  Other vitamin B12 deficiency anemia Will check labs, no supplementation at this time Contributing to fatigue? -     Vitamin B12  Iron deficiency anemia, unspecified iron deficiency anemia type Not taking supplements at this time, multivitamin Contributing to fatigue? -     Iron,Total/Total Iron Binding Cap  Primary insomnia Using Trazodone 300mg  Doing well with this Feeling well rested  BMI 25.0-25.9,adult Discussed dietary and exercise modifications  Occasional tremors Will check for any electrolyte imbalances, TSH? Continue to monitor symptoms, duration and frequency Continue office with new or worsening symptoms  Discussed hospital precautions with patient and agrees with plan of care.  We will contact you in 1-3 business days with lab results drawn today.  Follow up on 08/08/19 for physical appointment  Call or return with new or worsening symptoms as discussed in appointment.  May contact office via phone [phone] or MyChart.  Over 40 minutes of exam, counseling, chart review and critical decision making was performed Future Appointments  Date Time Provider Department Center  08/08/2019  3:00 PM Lucky CowboyMcKeown, William, MD [facility] None     Plan:   During the course of the visit the patient was educated and counseled about appropriate screening and preventive services including:    Pneumococcal vaccine   Prevnar 13  Influenza vaccine  Td vaccine  Screening electrocardiogram  Bone densitometry screening  Colorectal cancer screening  Diabetes screening  Glaucoma screening  Nutrition counseling   Advanced directives: requested   Subjective:  [name] is a 85 y.o. female who presents for Medicare Annual Wellness Visit and 3 month follow up for HTN, HLD, GERD, Oteoarthritis, DMII with CKD, Osteopenia, depression, Vitamin D Deficency, B12 deficency, anemia and insomnia. Reports she has been feeling increasing fatigue that has been progressive over the past several months.  She is wondering if her thyroid level is abnormal.  She has not made any recent dietary, medication or activity changes. She also reports increase in tremors to her left hand.  Reports it is intermittent and is at rest.  Notices this more when she has been active for the day.  Denies any numbness tingling, sensory change or weakness to the extremity.   She has had elevated blood pressure and on treatment since 1987. Her blood pressure has  been controlled at home, today their BP is BP: 120/68 She does workout. She denies chest pain, shortness of breath, dizziness.  She is on cholesterol medication and denies myalgias. Her cholesterol is at goal. The cholesterol last visit was:   Lab Results  Component Value Date   CHOL 152 01/04/2019   HDL 64 01/04/2019   LDLCALC 66 01/04/2019   TRIG 135 01/04/2019   CHOLHDL 2.4 01/04/2019   She has had diabetes for 12 years, with CKD, (A1c 6/5% 2008, 7.1%, 2016 and 6.7%, 2017). She is taking Metformin , two tablets twice a day. She has been working on diet and exercise for , and denies hyperglycemia, hypoglycemia , nausea, paresthesia of the feet, polydipsia, polyuria, visual disturbances, vomiting and weight loss. She denies hypoglycemia and checks her glucose regularly.  Last A1C in the office was:  Lab Results  Component Value Date   HGBA1C 7.6 (H) 01/04/2019   Last GFR:   Lab Results  Component Value Date   GFRNONAA 50 (L) 01/04/2019   Lab Results  Component Value Date   GFRAA 58 (L) 01/04/2019   Patient is on Vitamin D supplement.   Lab Results  Component Value Date   VD25OH 42 01/04/2019     She is on thyroid  medication. Her medication was not changed last visit.   Lab Results  Component Value Date   TSH 0.90 04/12/2019  .   Medication Review: Current Outpatient Medications on File Prior to Visit  Medication Sig Dispense Refill  . aspirin 81 MG tablet Take 81 mg by mouth daily.    [name] Kitchen atenolol (TENORMIN) 100 MG tablet Take 1 tablet every Morning for BP 90 tablet 3  . atorvastatin (LIPITOR) 80 MG tablet TAKE 1 TABLET BY MOUTH EVERY DAY 90 tablet 3  . Biotin 10 MG CAPS Take 1 tablet by mouth daily.     . cholecalciferol (VITAMIN D) 1000 UNITS tablet Take 1,000 Units by mouth daily.    . citalopram (CELEXA) 20 MG tablet TAKE 1 TABLET BY MOUTH EVERY DAY FOR MOOD 90 tablet 0  . docusate sodium (COLACE) 100 MG capsule Take 1 capsule (100 mg total) by mouth 3 (three) times daily as needed. 20 capsule 0  . furosemide (LASIX) 40 MG tablet take 1 tablet by mouth once daily 90 tablet 1  . gabapentin (NEURONTIN) 300 MG capsule Take 1 capsule 2 x /day Suppertime & Bedtime for Sleep 60 capsule 0  . glucose blood test strip Check blood sugar 1 time daily-DX-E11.22. 100 each 1  . hydrOXYzine (ATARAX/VISTARIL) 10 MG tablet Take 1 tablet 3 x /  day if needed for Nerves 270 tablet 1  . levothyroxine (SYNTHROID, LEVOTHROID) 100 MCG tablet TAKE 1 TABLET BY MOUTH EVERY MORNING 90 tablet 0  . losartan (COZAAR) 100 MG tablet TAKE 1 TABLET BY MOUTH ONCE DAILY 90 tablet 1  . metFORMIN (GLUCOPHAGE-XR) 500 MG 24 hr tablet Take 2 tablets 2 x /day after meals  for Diabetes 360 tablet 3  . mexiletine (MEXITIL) 150 MG capsule TAKE 1 CAPSULE BY MOUTH THREE TIMES DAILY 270 capsule 0  . minoxidil (LONITEN) 10 MG tablet Take 1/2 to 1 tablet daily for BP 90 tablet 1  . Multiple Vitamins-Minerals (MULTIVITAMIN WITH MINERALS) tablet Take 1 tablet by mouth daily.    . prednisoLONE acetate (PRED FORTE) 1 % ophthalmic suspension SHAKE LIQUID AND INSTILL 1 DROP IN BOTH EYES FOUR TIMES DAILY 5 mL 0  . PRODIGY LANCETS 28G MISC Check blood  sugar 1 time daily-DX-E11.22. 100 each 1  . trazodone (DESYREL) 300 MG tablet TAKE 1 TABLET BY MOUTH AT BEDTIME 90 tablet 3  . verapamil (VERELAN PM) 120 MG 24 hr capsule TAKE ONE CAPSULE BY MOUTH TWICE DAILY WITH FOOD 180 capsule 1  . QUEtiapine (SEROQUEL) 50 MG tablet Take 1/2 to 1 tablet 1 hour before Bedtime 30 tablet 0   No current facility-administered medications on file prior to visit.     Allergies  Allergen Reactions  . Codeine Rash    rash  . Vasotec [Enalapril] Other (See Comments)    cough  . Augmentin [Amoxicillin-Pot Clavulanate] Other (See Comments)    vaginitis  . Hytrin [Terazosin] Other (See Comments)    incontinence   . Oxycodone Rash    rash  . Procardia [Nifedipine] Other (See Comments)    edema    Current Problems (verified) Patient Active Problem List   Diagnosis Date Noted  . Osteopenia 05/25/2018  . Prediabetes 03/28/2018  . Gastroesophageal reflux disease without esophagitis 12/01/2016  . BMI 25.0-25.9,adult 10/02/2015  . Medicare annual wellness visit, subsequent 10/02/2015  . Depression, major, recurrent, in partial remission (HCC) 02/15/2015  . Vitamin D deficiency 06/14/2014  . Medication management 06/14/2014  . CKD stage 3 due to type 2 diabetes mellitus (HCC) 06/14/2014  . Hypothyroidism 12/02/2013  . Essential hypertension 12/02/2013  . DJD 12/02/2013  . Mixed hyperlipidemia 12/02/2013    Screening Tests Immunization History  Administered Date(s) Administered  . Influenza, High Dose Seasonal PF 09/05/2014, 10/02/2015, 07/24/2016, 09/16/2017, 10/04/2018  . Influenza-Unspecified 09/10/2013  . Pneumococcal Conjugate-13 07/24/2016  . Pneumococcal-Unspecified 11/26/2012  . Td 11/26/2012  . Tdap 08/24/2014    Preventative care: Last colonoscopy: 2011 Last mammogram: 2019 Last pap smear/pelvic exam: N/A DEXA: 2019  Prior vaccinations: TD or Tdap: 2015  Influenza: 2019 Pneumococcal: 2014 Prevnar13: 2017 Shingles/Zostavax:  Declined?  Names of Other Physician/Practitioners you currently use: 1. [facility] here for primary care 2. Eye Exam 2019, due for 2020 3. Dental Exam, Dr Jaynie Crumble 2020 Patient Care Team: [name], MD as PCP - General (Internal Medicine)  SURGICAL HISTORY She  has a past surgical history that includes Abdominal hysterectomy; Tonsillectomy; Appendectomy; Cerebral aneurysm repair (09/2007); Joint replacement (Left, 2012); Total knee arthroplasty (Right, 2005); Total knee revision (Right, 2007); Hammer toe surgery (2008); Ganglion cyst excision (Left, 2008); Cholecystectomy (2012); Colonoscopy; Shoulder arthroscopy with rotator cuff repair and subacromial decompression (Right, 01/29/2015); Back surgery (02/06/2016); Carpal tunnel release (Left, 07/08/2016); and Breast biopsy. FAMILY HISTORY Her family history includes Breast cancer in her mother; Cancer in her father,  maternal aunt, and mother; Hypertension in her father and mother; Mental illness in her mother. SOCIAL HISTORY She  reports that she has never smoked. She has never used smokeless tobacco. She reports current alcohol use. She reports that she does not use drugs.   MEDICARE WELLNESS OBJECTIVES: Physical activity: Current Exercise Habits: Home exercise routine, Type of exercise: walking, Time (Minutes): 20, Frequency (Times/Week): 3, Weekly Exercise (Minutes/Week): 60, Exercise limited by: cardiac condition(s);orthopedic condition(s) Cardiac risk factors: Cardiac Risk Factors include: advanced age (>29men, >12 women);diabetes mellitus;dyslipidemia;hypertension Depression/mood screen:   Depression screen [facility] 2/9 04/12/2019  Decreased Interest 0  Down, Depressed, Hopeless 0  PHQ - 2 Score 0    ADLs:  In your present state of health, do you have any difficulty performing the following activities: 04/12/2019 01/03/2019  Hearing? N N  Vision? N N  Difficulty concentrating or making decisions? N  N  Walking or climbing stairs? N N  Dressing or bathing? N N  Doing errands, shopping? N N  Preparing Food and eating ? N -  Using the Toilet? N -  In the past six months, have you accidently leaked urine? N -  Do you have problems with loss of bowel control? N -  Managing your Medications? N -  Managing your Finances? N -  Housekeeping or managing your Housekeeping? N -  Some recent data might be hidden     Cognitive Testing  Alert? Yes  Normal Appearance?Yes  Oriented to person? Yes  Place? Yes   Time? Yes  Recall of three objects?  Yes  Can perform simple calculations? Yes  Displays appropriate judgment?Yes  Can read the correct time from a watch face?Yes  EOL planning: Does Patient Have a Medical Advance Directive?: Yes Type of Advance Directive: Living will, Healthcare Power of Attorney Does patient want to make changes to medical advance directive?: Yes (Inpatient - patient defers changing a medical advance directive at this time - Information given) Copy of Healthcare Power of Attorney in Chart?: No - copy requested  Review of Systems  Constitutional: Negative for chills, diaphoresis, fever, malaise/fatigue and weight loss.  HENT: Negative for congestion, ear discharge, ear pain, hearing loss, nosebleeds, sinus pain, sore throat and tinnitus.   Eyes: Negative for blurred vision, double vision, photophobia, pain, discharge and redness.  Respiratory: Negative for cough, hemoptysis, sputum production, shortness of breath, wheezing and stridor.   Cardiovascular: Negative for chest pain, palpitations, orthopnea, claudication, leg swelling and PND.  Gastrointestinal: Negative for abdominal pain, blood in stool, constipation, diarrhea, heartburn, melena, nausea and vomiting.  Genitourinary: Negative for dysuria, flank pain, frequency, hematuria and urgency.  Musculoskeletal: Negative for back pain, falls, joint pain, myalgias and neck pain.  Skin: Negative for itching and rash.   Neurological: Negative for dizziness, tingling, tremors, sensory change, speech change, focal weakness, seizures, loss of consciousness, weakness and headaches.  Endo/Heme/Allergies: Negative for environmental allergies and polydipsia. Does not bruise/bleed easily.  Psychiatric/Behavioral: Negative for depression, hallucinations, memory loss, substance abuse and suicidal ideas. The patient is not nervous/anxious and does not have insomnia.      Objective:     Today's Vitals   04/12/19 1534  BP: 120/68  Pulse: 64  Temp: (!) 97.5 F (36.4 C)  SpO2: 97%  Weight: 137 lb (62.1 kg)  Height:  (1.575 m)   Body mass index is 25.06 kg/m.  General appearance: alert, no distress, WD/WN, female HEENT: normocephalic, sclerae anicteric, TMs pearly, nares patent, no discharge or erythema, pharynx normal  Oral cavity: MMM, no lesions Neck: supple, no lymphadenopathy, no thyromegaly, no masses Heart: RRR, normal S1, S2, no murmurs Lungs: CTA bilaterally, no wheezes, rhonchi, or rales Abdomen: +bs, soft, non tender, non distended, no masses, no hepatomegaly, no splenomegaly Musculoskeletal: nontender, no swelling, no obvious deformity Extremities: no edema, no cyanosis, no clubbing Pulses: 2+ symmetric, upper and lower extremities, normal cap refill Neurological: alert, oriented x 3, CN2-12 intact, strength normal upper extremities and lower extremities, sensation normal throughout, DTRs 2+ throughout, no cerebellar signs, gait normal Psychiatric: normal affect, behavior normal, pleasant   Medicare Attestation I have personally reviewed: The patient's medical and social history Their use of alcohol, tobacco or illicit drugs Their current medications and supplements The patient's functional ability including ADLs,fall risks, home safety risks, cognitive, and hearing and visual impairment Diet and physical activities Evidence for depression or mood disorders  The patient's weight, height,  BMI, and visual acuity have been recorded in the chart.  I have made referrals, counseling, and provided education to the patient based on review of the above and I have provided the patient with a written personalized care plan for preventive services.     Elder Negus, NP [facility] 04/12/2019  4:17 PM

## 2019-04-12 NOTE — Patient Instructions (Addendum)
You can try taking Atenolol (Tenormin) '100mg'$  at night before bed.  See if this improves your energy.    You can also try taking Verapamil '120mg'$  at night.  Make one change at a time to see if this helps.   Please check your medications for the Gabapentin (Neurontin) '300mg'$ .  You were taking this once a day. IF you are taking this during the day, DISCONTINUE.   We will contact you in 1-3 days with your lab results.  All of your health maintenance and immunizations are up to date.   - Vit D  And Vit C 1,000 mg  are recommended to help protect  against the Covid_19 and other Corona viruses.   - Also it's recommended to take Zinc 50 mg to help  protect against the Covid_19  And best place to get  is also on [employer].com and don't pay more than 6-8 cents /pill !   ================================ Coronavirus (COVID-19) Are you at risk?  Are you at risk for the Coronavirus (COVID-19)?  To be considered HIGH RISK for Coronavirus (COVID-19), you have to meet the following criteria:  . Traveled to [place], [place], [place], [place] or [place]; or in the [place] to [place], [place], [place]  . or [place]; and have fever, cough, and shortness of breath within the last 2 weeks of travel OR . Been in close contact with a person diagnosed with COVID-19 within the last 2 weeks and have  . fever, cough,and shortness of breath .  . IF YOU DO NOT MEET THESE CRITERIA, YOU ARE CONSIDERED LOW RISK FOR COVID-19.  What to do if you are HIGH RISK for COVID-19?  [name] Kitchen If you are having a medical emergency, call 911. . Seek medical care right away. Before you go to a doctor's office, urgent care or emergency department, .  call ahead and tell them about your recent travel, contact with someone diagnosed with COVID-19  .  and your symptoms.  . You should receive instructions from your physician's office regarding next steps of care.  . When you arrive at healthcare provider, tell the healthcare  staff immediately you have returned from  . visiting [place], [place], [place], [place] or [place]; or traveled in the [place] to [place], [place],  . [place] or [place] in the last two weeks or you have been in close contact with a person diagnosed with  . COVID-19 in the last 2 weeks.   . Tell the health care staff about your symptoms: fever, cough and shortness of breath. . After you have been seen by a medical provider, you will be either: o Tested for (COVID-19) and discharged home on quarantine except to seek medical care if  o symptoms worsen, and asked to  - Stay home and avoid contact with others until you get your results (4-5 days)  - Avoid travel on public transportation if possible (such as bus, train, or airplane) or o Sent to the Emergency Department by EMS for evaluation, COVID-19 testing  and  o possible admission depending on your condition and test results.  What to do if you are LOW RISK for COVID-19?  Reduce your risk of any infection by using the same precautions used for avoiding the common cold or flu:  [name] Kitchen Wash your hands often with soap and warm water for at least 20 seconds.  If soap and water are not readily available,  . use an alcohol-based hand sanitizer with at  least 60% alcohol.  . If coughing or sneezing, cover your mouth and nose by coughing or sneezing into the elbow areas of your shirt or coat, .  into a tissue or into your sleeve (not your hands). . Avoid shaking hands with others and consider head nods or verbal greetings only. . Avoid touching your eyes, nose, or mouth with unwashed hands.  . Avoid close contact with people who are sick. . Avoid places or events with large numbers of people in one location, like concerts or sporting events. . Carefully consider travel plans you have or are making. . If you are planning any travel outside or inside the [place], visit the CDC's Travelers' Health webpage for the latest health notices. . If you have  some symptoms but not all symptoms, continue to monitor at home and seek medical attention  . if your symptoms worsen. . If you are having a medical emergency, call 911.   . >>>>>>>>>>>>>>>>>>>>>>>>>>>>>>>>> . We Do NOT Approve of  [facility], [employer]  Our Patients  To Do Home Visits & We Do NOT Approve of [employer] > > > > > > > > > > > > > > > > > > > > > > > > > > > > > > > > > > > > > > >  Preventive Care for Adults  A healthy lifestyle and preventive care can promote health and wellness. Preventive health guidelines for women include the following key practices.  A routine yearly physical is a good way to check with your health care provider about your health and preventive screening. It is a chance to share any concerns and updates on your health and to receive a thorough exam.  Visit your dentist for a routine exam and preventive care every 6 months. Brush your teeth twice a day and floss once a day. Good oral hygiene prevents tooth decay and gum disease.  The frequency of eye exams is based on your age, health, family medical history, use of contact lenses, and other factors. Follow your health care provider's recommendations for frequency of eye exams.  Eat a healthy diet. Foods like vegetables, fruits, whole grains, low-fat dairy products, and lean protein foods contain the nutrients you need without too many calories. Decrease your intake of foods high in solid fats, added sugars, and salt. Eat the right amount of calories for you. Get information about a proper diet from your health care provider, if necessary.  Regular physical exercise is one of the most important things you can do for your health. Most adults should get at least 150 minutes of moderate-intensity exercise (any activity that increases your heart rate and causes you to sweat) each week. In addition, most adults need muscle-strengthening exercises on 2 or more days a  week.  Maintain a healthy weight. The body mass index (BMI) is a screening tool to identify possible weight problems. It provides an estimate of body fat based on height and weight. Your health care provider can find your BMI and can help you achieve or maintain a healthy weight. For adults 20 years and older:  A BMI below 18.5 is considered underweight.  A BMI of 18.5 to 24.9 is normal.  A BMI of 25 to 29.9 is considered overweight.  A BMI of 30 and above is considered obese.  Maintain normal blood lipids and cholesterol levels by exercising and minimizing your intake of saturated fat. Eat a balanced diet with plenty of fruit  and vegetables. If your lipid or cholesterol levels are high, you are over 50, or you are at high risk for heart disease, you may need your cholesterol levels checked more frequently. Ongoing high lipid and cholesterol levels should be treated with medicines if diet and exercise are not working.  If you smoke, find out from your health care provider how to quit. If you do not use tobacco, do not start.  Lung cancer screening is recommended for adults aged 54-80 years who are at high risk for developing lung cancer because of a history of smoking. A yearly low-dose CT scan of the lungs is recommended for people who have at least a 30-pack-year history of smoking and are a current smoker or have quit within the past 15 years. A pack year of smoking is smoking an average of 1 pack of cigarettes a day for 1 year (for example: 1 pack a day for 30 years or 2 packs a day for 15 years). Yearly screening should continue until the smoker has stopped smoking for at least 15 years. Yearly screening should be stopped for people who develop a health problem that would prevent them from having lung cancer treatment.  Avoid use of street drugs. Do not share needles with anyone. Ask for help if you need support or instructions about stopping the use of drugs.  High blood pressure causes  heart disease and increases the risk of stroke.  Ongoing high blood pressure should be treated with medicines if weight loss and exercise do not work.  If you are 45-4 years old, ask your health care provider if you should take aspirin to prevent strokes.  Diabetes screening involves taking a blood sample to check your fasting blood sugar level. This should be done once every 3 years, after age 41, if you are within normal weight and without risk factors for diabetes. Testing should be considered at a younger age or be carried out more frequently if you are overweight and have at least 1 risk factor for diabetes.  Breast cancer screening is essential preventive care for women. You should practice "breast self-awareness." This means understanding the normal appearance and feel of your breasts and may include breast self-examination. Any changes detected, no matter how small, should be reported to a health care provider. Women in their 57s and 30s should have a clinical breast exam (CBE) by a health care provider as part of a regular health exam every 1 to 3 years. After age 5, women should have a CBE every year. Starting at age 20, women should consider having a mammogram (breast X-ray test) every year. Women who have a family history of breast cancer should talk to their health care provider about genetic screening. Women at a high risk of breast cancer should talk to their health care providers about having an MRI and a mammogram every year.  Breast cancer gene (BRCA)-related cancer risk assessment is recommended for women who have family members with BRCA-related cancers. BRCA-related cancers include breast, ovarian, tubal, and peritoneal cancers. Having family members with these cancers may be associated with an increased risk for harmful changes (mutations) in the breast cancer genes BRCA1 and BRCA2. Results of the assessment will determine the need for genetic counseling and BRCA1 and BRCA2  testing.  Routine pelvic exams to screen for cancer are no longer recommended for nonpregnant women who are considered low risk for cancer of the pelvic organs (ovaries, uterus, and vagina) and who do not have symptoms. Ask  your health care provider if a screening pelvic exam is right for you.  If you have had past treatment for cervical cancer or a condition that could lead to cancer, you need Pap tests and screening for cancer for at least 20 years after your treatment. If Pap tests have been discontinued, your risk factors (such as having a new sexual partner) need to be reassessed to determine if screening should be resumed. Some women have medical problems that increase the chance of getting cervical cancer. In these cases, your health care provider may recommend more frequent screening and Pap tests.    Colorectal cancer can be detected and often prevented. Most routine colorectal cancer screening begins at the age of 58 years and continues through age 38 years. However, your health care provider may recommend screening at an earlier age if you have risk factors for colon cancer. On a yearly basis, your health care provider may provide home test kits to check for hidden blood in the stool. Use of a small camera at the end of a tube, to directly examine the colon (sigmoidoscopy or colonoscopy), can detect the earliest forms of colorectal cancer. Talk to your health care provider about this at age 24, when routine screening begins.  Direct exam of the colon should be repeated every 5-10 years through age 85 years, unless early forms of pre-cancerous polyps or small growths are found.  Osteoporosis is a disease in which the bones lose minerals and strength with aging. This can result in serious bone fractures or breaks. The risk of osteoporosis can be identified using a bone density scan. Women ages 72 years and over and women at risk for fractures or osteoporosis should discuss screening with their  health care providers. Ask your health care provider whether you should take a calcium supplement or vitamin D to reduce the rate of osteoporosis.  Menopause can be associated with physical symptoms and risks. Hormone replacement therapy is available to decrease symptoms and risks. You should talk to your health care provider about whether hormone replacement therapy is right for you.  Use sunscreen. Apply sunscreen liberally and repeatedly throughout the day. You should seek shade when your shadow is shorter than you. Protect yourself by wearing long sleeves, pants, a wide-brimmed hat, and sunglasses year round, whenever you are outdoors.  Once a month, do a whole body skin exam, using a mirror to look at the skin on your back. Tell your health care provider of new moles, moles that have irregular borders, moles that are larger than a pencil eraser, or moles that have changed in shape or color.  Stay current with required vaccines (immunizations).  Influenza vaccine. All adults should be immunized every year.  Tetanus, diphtheria, and acellular pertussis (Td, Tdap) vaccine. Pregnant women should receive 1 dose of Tdap vaccine during each pregnancy. The dose should be obtained regardless of the length of time since the last dose. Immunization is preferred during the 27th-36th week of gestation. An adult who has not previously received Tdap or who does not know her vaccine status should receive 1 dose of Tdap. This initial dose should be followed by tetanus and diphtheria toxoids (Td) booster doses every 10 years. Adults with an unknown or incomplete history of completing a 3-dose immunization series with Td-containing vaccines should begin or complete a primary immunization series including a Tdap dose. Adults should receive a Td booster every 10 years.    Zoster vaccine. One dose is recommended for adults aged 60  years or older unless certain conditions are present.    Pneumococcal 13-valent  conjugate (PCV13) vaccine. When indicated, a person who is uncertain of her immunization history and has no record of immunization should receive the PCV13 vaccine. An adult aged 33 years or older who has certain medical conditions and has not been previously immunized should receive 1 dose of PCV13 vaccine. This PCV13 should be followed with a dose of pneumococcal polysaccharide (PPSV23) vaccine. The PPSV23 vaccine dose should be obtained at least 1 or more year(s) after the dose of PCV13 vaccine. An adult aged 55 years or older who has certain medical conditions and previously received 1 or more doses of PPSV23 vaccine should receive 1 dose of PCV13. The PCV13 vaccine dose should be obtained 1 or more years after the last PPSV23 vaccine dose.    Pneumococcal polysaccharide (PPSV23) vaccine. When PCV13 is also indicated, PCV13 should be obtained first. All adults aged 25 years and older should be immunized. An adult younger than age 70 years who has certain medical conditions should be immunized. Any person who resides in a nursing home or long-term care facility should be immunized. An adult smoker should be immunized. People with an immunocompromised condition and certain other conditions should receive both PCV13 and PPSV23 vaccines. People with human immunodeficiency virus (HIV) infection should be immunized as soon as possible after diagnosis. Immunization during chemotherapy or radiation therapy should be avoided. Routine use of PPSV23 vaccine is not recommended for American Indians, [place] Natives, or people younger than 65 years unless there are medical conditions that require PPSV23 vaccine. When indicated, people who have unknown immunization and have no record of immunization should receive PPSV23 vaccine. One-time revaccination 5 years after the first dose of PPSV23 is recommended for people aged 19-64 years who have chronic kidney failure, nephrotic syndrome, asplenia, or immunocompromised  conditions. People who received 1-2 doses of PPSV23 before age 25 years should receive another dose of PPSV23 vaccine at age 29 years or later if at least 5 years have passed since the previous dose. Doses of PPSV23 are not needed for people immunized with PPSV23 at or after age 73 years.   Preventive Services / Frequency  Ages 4 years and over  Blood pressure check.  Lipid and cholesterol check.  Lung cancer screening. / Every year if you are aged 53-80 years and have a 30-pack-year history of smoking and currently smoke or have quit within the past 15 years. Yearly screening is stopped once you have quit smoking for at least 15 years or develop a health problem that would prevent you from having lung cancer treatment.  Clinical breast exam.** / Every year after age 76 years.   BRCA-related cancer risk assessment.** / For women who have family members with a BRCA-related cancer (breast, ovarian, tubal, or peritoneal cancers).  Mammogram.** / Every year beginning at age 43 years and continuing for as long as you are in good health. Consult with your health care provider.  Pap test.** / Every 3 years starting at age 35 years through age 45 or 32 years with 3 consecutive normal Pap tests. Testing can be stopped between 65 and 70 years with 3 consecutive normal Pap tests and no abnormal Pap or HPV tests in the past 10 years.  Fecal occult blood test (FOBT) of stool. / Every year beginning at age 83 years and continuing until age 75 years. You may not need to do this test if you get a colonoscopy every 10  years.  Flexible sigmoidoscopy or colonoscopy.** / Every 5 years for a flexible sigmoidoscopy or every 10 years for a colonoscopy beginning at age 86 years and continuing until age 13 years.  Hepatitis C blood test.** / For all people born from [birth year] through 1963 and any individual with known risks for hepatitis C.  Osteoporosis screening.** / A one-time screening for women ages 78 years and  over and women at risk for fractures or osteoporosis.  Skin self-exam. / Monthly.  Influenza vaccine. / Every year.  Tetanus, diphtheria, and acellular pertussis (Tdap/Td) vaccine.** / 1 dose of Td every 10 years.  Zoster vaccine.** / 1 dose for adults aged 76 years or older.  Pneumococcal 13-valent conjugate (PCV13) vaccine.** / Consult your health care provider.  Pneumococcal polysaccharide (PPSV23) vaccine.** / 1 dose for all adults aged 88 years and older. Screening for abdominal aortic aneurysm (AAA)  by ultrasound is recommended for people who have history of high blood pressure or who are current or former smokers. ++++++++++++++++++++ Recommend Adult Low Dose Aspirin or  coated  Aspirin 81 mg daily  To reduce risk of Colon Cancer 40 %,  Skin Cancer 26 % ,  Melanoma 46%  and  Pancreatic cancer 60% ++++++++++++++++++++ Vitamin D goal  is between 70-100.  Please make sure that you are taking your Vitamin D as directed.  It is very important as a natural anti-inflammatory  helping hair, skin, and nails, as well as reducing stroke and heart attack risk.  It helps your bones and helps with mood. It also decreases numerous cancer risks so please take it as directed.  Low Vit D is associated with a 200-300% higher risk for CANCER  and 200-300% higher risk for HEART   ATTACK  &  STROKE.   .....................................[name] Kitchen It is also associated with higher death rate at younger ages,  autoimmune diseases like Rheumatoid arthritis, Lupus, Multiple Sclerosis.    Also many other serious conditions, like depression, Alzheimer's Dementia, infertility, muscle aches, fatigue, fibromyalgia - just to name a few. ++++++++++++++++++ Recommend the book "The END of DIETING" by Dr Excell Seltzer  & the book "The END of DIABETES " by Dr Excell Seltzer At [email or web address] - get book & Audio CD's    Being diabetic has a  300% increased risk for heart attack, stroke, cancer, and alzheimer- type  vascular dementia. It is very important that you work harder with diet by avoiding all foods that are white. Avoid white rice (brown & wild rice is OK), white potatoes (sweetpotatoes in moderation is OK), White bread or wheat bread or anything made out of white flour like bagels, donuts, rolls, buns, biscuits, cakes, pastries, cookies, pizza crust, and pasta (made from white flour & egg whites) - vegetarian pasta or spinach or wheat pasta is OK. Multigrain breads like Arnold's or [employer], or multigrain sandwich thins or flatbreads.  Diet, exercise and weight loss can reverse and cure diabetes in the early stages.  Diet, exercise and weight loss is very important in the control and prevention of complications of diabetes which affects every system in your body, ie. Brain - dementia/stroke, eyes - glaucoma/blindness, heart - heart attack/heart failure, kidneys - dialysis, stomach - gastric paralysis, intestines - malabsorption, nerves - severe painful neuritis, circulation - gangrene & loss of a leg(s), and finally cancer and Alzheimers.    I recommend avoid fried & greasy foods,  sweets/candy, white rice (brown or wild rice or Quinoa is OK), white potatoes (sweet potatoes are  OK) - anything made from white flour - bagels, doughnuts, rolls, buns, biscuits,white and wheat breads, pizza crust and traditional pasta made of white flour & egg white(vegetarian pasta or spinach or wheat pasta is OK).  Multi-grain bread is OK - like multi-grain flat bread or sandwich thins. Avoid alcohol in excess. Exercise is also important.    Eat all the vegetables you want - avoid meat, especially red meat and dairy - especially cheese.  Cheese is the most concentrated form of trans-fats which is the worst thing to clog up our arteries. Veggie cheese is OK which can be found in the fresh produce section at [employer] or [employer] or [employer]  +++++++++++++++++++ DASH Eating Plan  DASH stands for "Dietary Approaches  to Stop Hypertension."   The DASH eating plan is a healthy eating plan that has been shown to reduce high blood pressure (hypertension). Additional health benefits may include reducing the risk of type 2 diabetes mellitus, heart disease, and stroke. The DASH eating plan may also help with weight loss. WHAT DO I NEED TO KNOW ABOUT THE DASH EATING PLAN? For the DASH eating plan, you will follow these general guidelines:  Choose foods with a percent daily value for sodium of less than 5% (as listed on the food label).  Use salt-free seasonings or herbs instead of table salt or sea salt.  Check with your health care provider or pharmacist before using salt substitutes.  Eat lower-sodium products, often labeled as "lower sodium" or "no salt added."  Eat fresh foods.  Eat more vegetables, fruits, and low-fat dairy products.  Choose whole grains. Look for the word "whole" as the first word in the ingredient list.  Choose fish   Limit sweets, desserts, sugars, and sugary drinks.  Choose heart-healthy fats.  Eat veggie cheese   Eat more home-cooked food and less restaurant, buffet, and fast food.  Limit fried foods.  Cook foods using methods other than frying.  Limit canned vegetables. If you do use them, rinse them well to decrease the sodium.  When eating at a restaurant, ask that your food be prepared with less salt, or no salt if possible.                      WHAT FOODS CAN I EAT? Read Dr Fara Olden Fuhrman's books on The End of Dieting & The End of Diabetes  Grains Whole grain or whole wheat bread. Brown rice. Whole grain or whole wheat pasta. Quinoa, bulgur, and whole grain cereals. Low-sodium cereals. Corn or whole wheat flour tortillas. Whole grain cornbread. Whole grain crackers. Low-sodium crackers.  Vegetables Fresh or frozen vegetables (raw, steamed, roasted, or grilled). Low-sodium or reduced-sodium tomato and vegetable juices. Low-sodium or reduced-sodium tomato sauce and  paste. Low-sodium or reduced-sodium canned vegetables.   Fruits All fresh, canned (in natural juice), or frozen fruits.  Protein Products  All fish and seafood.  Dried beans, peas, or lentils. Unsalted nuts and seeds. Unsalted canned beans.  Dairy Low-fat dairy products, such as skim or 1% milk, 2% or reduced-fat cheeses, low-fat ricotta or cottage cheese, or plain low-fat yogurt. Low-sodium or reduced-sodium cheeses.  Fats and Oils Tub margarines without trans fats. Light or reduced-fat mayonnaise and salad dressings (reduced sodium). Avocado. Safflower, olive, or canola oils. Natural peanut or almond butter.  Other Unsalted popcorn and pretzels. The items listed above may not be a complete list of recommended foods or beverages. Contact your dietitian for more options.  +++++++++++++++  WHAT FOODS ARE NOT RECOMMENDED? Grains/ White flour or wheat flour White bread. White pasta. White rice. Refined cornbread. Bagels and croissants. Crackers that contain trans fat.  Vegetables  Creamed or fried vegetables. Vegetables in a . Regular canned vegetables. Regular canned tomato sauce and paste. Regular tomato and vegetable juices.  Fruits Dried fruits. Canned fruit in light or heavy syrup. Fruit juice.  Meat and Other Protein Products Meat in general - RED meat & White meat.  Fatty cuts of meat. Ribs, chicken wings, all processed meats as bacon, sausage, bologna, salami, fatback, hot dogs, bratwurst and packaged luncheon meats.  Dairy Whole or 2% milk, cream, half-and-half, and cream cheese. Whole-fat or sweetened yogurt. Full-fat cheeses or blue cheese. Non-dairy creamers and whipped toppings. Processed cheese, cheese spreads, or cheese curds.  Condiments Onion and garlic salt, seasoned salt, table salt, and sea salt. Canned and packaged gravies. Worcestershire sauce. Tartar sauce. Barbecue sauce. Teriyaki sauce. Soy sauce, including reduced sodium. Steak sauce. Fish sauce. Oyster  sauce. Cocktail sauce. Horseradish. Ketchup and mustard. Meat flavorings and tenderizers. Bouillon cubes. Hot sauce. Tabasco sauce. Marinades. Taco seasonings. Relishes.  Fats and Oils Butter, stick margarine, lard, shortening and bacon fat. Coconut, palm kernel, or palm oils. Regular salad dressings.  Pickles and olives. Salted popcorn and pretzels.  The items listed above may not be a complete list of foods and beverages to avoid.

## 2019-04-13 LAB — CBC WITH DIFFERENTIAL/PLATELET
Absolute Monocytes: 780 cells/uL (ref 200–950)
Basophils Absolute: 113 cells/uL (ref 0–200)
Basophils Relative: 1.2 %
Eosinophils Absolute: 254 cells/uL (ref 15–500)
Eosinophils Relative: 2.7 %
HCT: 35.2 % (ref 35.0–45.0)
Hemoglobin: 11.5 g/dL — ABNORMAL LOW (ref 11.7–15.5)
Lymphs Abs: 2773 cells/uL (ref 850–3900)
MCH: 30.8 pg (ref 27.0–33.0)
MCHC: 32.7 g/dL (ref 32.0–36.0)
MCV: 94.4 fL (ref 80.0–100.0)
MPV: 10.1 fL (ref 7.5–12.5)
Monocytes Relative: 8.3 %
Neutro Abs: 5480 cells/uL (ref 1500–7800)
Neutrophils Relative %: 58.3 %
Platelets: 309 10*3/uL (ref 140–400)
RBC: 3.73 10*6/uL — ABNORMAL LOW (ref 3.80–5.10)
RDW: 12.2 % (ref 11.0–15.0)
Total Lymphocyte: 29.5 %
WBC: 9.4 10*3/uL (ref 3.8–10.8)

## 2019-04-13 LAB — IRON, TOTAL/TOTAL IRON BINDING CAP
%SAT: 20 % (calc) (ref 16–45)
Iron: 54 ug/dL (ref 45–160)
TIBC: 270 mcg/dL (calc) (ref 250–450)

## 2019-04-13 LAB — MAGNESIUM: Magnesium: 1.9 mg/dL (ref 1.5–2.5)

## 2019-04-13 LAB — LIPID PANEL
Cholesterol: 134 mg/dL (ref <200)
HDL: 52 mg/dL (ref >=50)
LDL Cholesterol (Calc): 62 mg/dL (calc)
Non-HDL Cholesterol (Calc): 82 mg/dL (calc) (ref <130)
Total CHOL/HDL Ratio: 2.6 (calc) (ref <5.0)
Triglycerides: 113 mg/dL (ref <150)

## 2019-04-13 LAB — COMPLETE METABOLIC PANEL WITH GFR
AG Ratio: 2 (calc) (ref 1.0–2.5)
ALT: 10 U/L (ref 6–29)
AST: 17 U/L (ref 10–35)
Albumin: 4.5 g/dL (ref 3.6–5.1)
Alkaline phosphatase (APISO): 57 U/L (ref 37–153)
BUN/Creatinine Ratio: 13 (calc) (ref 6–22)
BUN: 13 mg/dL (ref 7–25)
CO2: 31 mmol/L (ref 20–32)
Calcium: 9.7 mg/dL (ref 8.6–10.4)
Chloride: 102 mmol/L (ref 98–110)
Creat: 0.97 mg/dL — ABNORMAL HIGH (ref 0.60–0.88)
GFR, Est African American: 62 mL/min/{1.73_m2} (ref >=60)
GFR, Est Non African American: 54 mL/min/{1.73_m2} — ABNORMAL LOW (ref >=60)
Globulin: 2.2 g/dL (calc) (ref 1.9–3.7)
Glucose, Bld: 95 mg/dL (ref 65–99)
Potassium: 4.1 mmol/L (ref 3.5–5.3)
Sodium: 141 mmol/L (ref 135–146)
Total Bilirubin: 0.4 mg/dL (ref 0.2–1.2)
Total Protein: 6.7 g/dL (ref 6.1–8.1)

## 2019-04-13 LAB — VITAMIN D 25 HYDROXY (VIT D DEFICIENCY, FRACTURES): Vit D, 25-Hydroxy: 88 ng/mL (ref 30–100)

## 2019-04-13 LAB — INSULIN, RANDOM: Insulin: 2.7 u[IU]/mL

## 2019-04-13 LAB — HEMOGLOBIN A1C
Hgb A1c MFr Bld: 6.5 % of total Hgb — ABNORMAL HIGH (ref <5.7)
Mean Plasma Glucose: 140 (calc)
eAG (mmol/L): 7.7 (calc)

## 2019-04-13 LAB — VITAMIN B12: Vitamin B-12: 480 pg/mL (ref 200–1100)

## 2019-04-13 LAB — TSH: TSH: 0.9 mIU/L (ref 0.40–4.50)

## 2019-05-02 ENCOUNTER — Other Ambulatory Visit: Payer: Self-pay | Admitting: Physician Assistant

## 2019-05-16 ENCOUNTER — Other Ambulatory Visit: Payer: Self-pay | Admitting: Internal Medicine

## 2019-05-16 DIAGNOSIS — Z1231 Encounter for screening mammogram for malignant neoplasm of breast: Secondary | ICD-10-CM

## 2019-06-28 ENCOUNTER — Ambulatory Visit: Payer: Medicare Other

## 2019-07-14 ENCOUNTER — Ambulatory Visit
Admission: RE | Admit: 2019-07-14 | Discharge: 2019-07-14 | Disposition: A | Discharge status: Home / Self Care | Payer: Medicare Other | Source: Ambulatory Visit | Attending: Internal Medicine | Admitting: Internal Medicine

## 2019-07-14 ENCOUNTER — Other Ambulatory Visit: Payer: Self-pay

## 2019-07-14 DIAGNOSIS — Z1231 Encounter for screening mammogram for malignant neoplasm of breast: Secondary | ICD-10-CM

## 2019-07-18 ENCOUNTER — Other Ambulatory Visit: Payer: Self-pay | Admitting: Internal Medicine

## 2019-07-18 ENCOUNTER — Other Ambulatory Visit: Payer: Self-pay | Admitting: Physician Assistant

## 2019-07-19 ENCOUNTER — Other Ambulatory Visit: Payer: Self-pay | Admitting: Internal Medicine

## 2019-07-19 DIAGNOSIS — F5101 Primary insomnia: Secondary | ICD-10-CM

## 2019-07-19 MED ORDER — GABAPENTIN 300 MG PO CAPS: ORAL_CAPSULE | ORAL | 3 refills | Status: AC

## 2019-08-03 ENCOUNTER — Other Ambulatory Visit: Payer: Self-pay | Admitting: *Deleted

## 2019-08-03 MED ORDER — LEVOTHYROXINE SODIUM 100 MCG PO TABS: ORAL_TABLET | ORAL | 0 refills | Status: DC

## 2019-08-07 ENCOUNTER — Encounter: Payer: Self-pay | Admitting: Internal Medicine

## 2019-08-07 NOTE — Progress Notes (Signed)
Comprehensive Evaluation &  Examination     This very nice 85 y.o. WWF  presents for a  comprehensive evaluation and management of multiple medical co-morbidities.  Patient has been followed for HTN, HLD, T2_NIDDM / CKD3  and Vitamin D Deficiency. Patient's GERD is controlled on her meds.      HTN predates since 4. Patient's BP has been controlled at home and patient denies any cardiac symptoms as chest pain, palpitations, shortness of breath, dizziness or ankle swelling. Today's BP was initially slightly elevated & rechecked at goal - 136/74.     Patient's hyperlipidemia is controlled with diet and Atorvastatin. Patient denies myalgias or other medication SE's. Last lipids were at goal:  Lab Results  Component Value Date   CHOL 134 04/12/2019   HDL 52 04/12/2019   LDLCALC 62 04/12/2019   TRIG 113 04/12/2019   CHOLHDL 2.6 04/12/2019      Patient has hx/o T2_NIDDM/CKD3  (A1c 6.5% / 2008, then A1c 7.1% / 2016 and  A1c 6.7% / 2017). Finally failing diet,  she was started on Metformin in  2016. Patient denies reactive hypoglycemic symptoms, visual blurring, diabetic polys or paresthesias. Last A1c was still not at goal:  Lab Results  Component Value Date   HGBA1C 6.5 (H) 04/12/2019      Patient was dx'd Hypothyroid in 2012 & started on Thyroid Replacement.       Finally, patient has history of Vitamin D Deficiency and last Vitamin D was at goal:  Lab Results  Component Value Date   VD25OH 88 04/12/2019   Current Outpatient Medications on File Prior to Visit  Medication Sig   aspirin 81 MG tablet Take 81 mg by mouth daily.   atenolol (TENORMIN) 100 MG tablet Take 1 tablet every Morning for BP   atorvastatin (LIPITOR) 80 MG tablet TAKE 1 TABLET BY MOUTH EVERY DAY   Biotin 10 MG CAPS Take 1 tablet by mouth daily.    cholecalciferol (VITAMIN D) 1000 UNITS tablet Take 1,000 Units by mouth daily.   citalopram (CELEXA) 20 MG tablet Take 1 tablet Daily for Mood   docusate  sodium (COLACE) 100 MG capsule Take 1 capsule (100 mg total) by mouth 3 (three) times daily as needed.   furosemide (LASIX) 40 MG tablet take 1 tablet by mouth once daily   gabapentin (NEURONTIN) 300 MG capsule Take 1 capsule 2 x /day Suppertime & Bedtime for Sleep   glucose blood test strip Check blood sugar 1 time daily-DX-E11.22.   levothyroxine (SYNTHROID) 100 MCG tablet Take 1 tablet daily on an empty stomach with only water for 30 minutes & no Antacid meds, Calcium or Magnesium for 4 hours & avoid Biotin   losartan (COZAAR) 100 MG tablet TAKE 1 TABLET BY MOUTH ONCE DAILY   metFORMIN (GLUCOPHAGE-XR) 500 MG 24 hr tablet Take 2 tablets 2 x /day after meals  for Diabetes   mexiletine (MEXITIL) 150 MG capsule TAKE 1 CAPSULE BY MOUTH THREE TIMES DAILY   Multiple Vitamins-Minerals (MULTIVITAMIN WITH MINERALS) tablet Take 1 tablet by mouth daily.   PRODIGY LANCETS 28G MISC Check blood sugar 1 time daily-DX-E11.22.   QUEtiapine (SEROQUEL) 50 MG tablet Take 1/2 to 1 tablet 1 hour before Bedtime   trazodone (DESYREL) 300 MG tablet Take 1 tablet 1 hour before Bedtime for Sleep   verapamil (VERELAN PM) 120 MG 24 hr capsule TAKE ONE CAPSULE BY MOUTH TWICE DAILY WITH FOOD   minoxidil (LONITEN) 10 MG tablet Take 1/2 to 1  tablet daily for BP   No current facility-administered medications on file prior to visit.    Allergies  Allergen Reactions   Codeine Rash    rash   Vasotec [Enalapril] Other (See Comments)    cough   Augmentin [Amoxicillin-Pot Clavulanate] Other (See Comments)    vaginitis   Hytrin [Terazosin] Other (See Comments)    incontinence    Oxycodone Rash    rash   Procardia [Nifedipine] Other (See Comments)    edema   Past Medical History:  Diagnosis Date   Anemia    Anxiety    Arthritis    Constipation    Depression    GERD (gastroesophageal reflux disease)    History of blood transfusion    History of toe surgery left   Hyperlipidemia     Hypertension    Hypothyroidism    Insomnia    Radiculopathy 02/06/2016   Thyroid disease    Type II or unspecified type diabetes mellitus with renal manifestations, not stated as uncontrolled(250.40)    stage II, does not take medication   Wears glasses    Health Maintenance  Topic Date Due   INFLUENZA VACCINE  06/11/2019   OPHTHALMOLOGY EXAM  08/10/2019   HEMOGLOBIN A1C  10/12/2019   FOOT EXAM  08/06/2020   TETANUS/TDAP  08/24/2024   DEXA SCAN  Completed   PNA vac Low Risk Adult  Completed   Immunization History  Administered Date(s) Administered   Influenza, High Dose Seasonal PF 09/05/2014, 10/02/2015, 07/24/2016, 09/16/2017, 10/04/2018   Influenza-Unspecified 09/10/2013   Pneumococcal Conjugate-13 07/24/2016   Pneumococcal-Unspecified 11/26/2012   Td 11/26/2012   Tdap 08/24/2014   Last Colon - 04/12/2010 - Dr Madilyn Fireman recc no f/u due to age  Last MGM - 07/14/2019  - Dexa BMD - 05/25/2018   Past Surgical History:  Procedure Laterality Date   ABDOMINAL HYSTERECTOMY     APPENDECTOMY     BACK SURGERY  02/06/2016   BREAST BIOPSY     core biopsy 1995   CARPAL TUNNEL RELEASE Left 07/08/2016   Procedure: CARPAL TUNNEL RELEASE;  Surgeon: Jones Broom, MD;  Location: [facility];  Service: Orthopedics;  Laterality: Left;  Left carpal tunnel release   CEREBRAL ANEURYSM REPAIR  09/2007   coil ablation   CHOLECYSTECTOMY  2012   lap choli   COLONOSCOPY     GANGLION CYST EXCISION Left 2008   lt ring finger   HAMMER TOE SURGERY  2008   osteotomy=left foot   JOINT REPLACEMENT Left 2012   left total knee   SHOULDER ARTHROSCOPY WITH ROTATOR CUFF REPAIR AND SUBACROMIAL DECOMPRESSION Right 01/29/2015   Procedure: RIGHT SHOULDER ARTHROSCOPY WITH DEBRIDEMENT ROTATOR CUFF TEAR, SUBACROMIAL DECOMPRESSION, DISTAL CLAVICAL RESECTION;  Surgeon: Jones Broom, MD;  Location: [facility];  Service: Orthopedics;  Laterality: Right;   Right shoulder arthroscopy debridement rotatoro cuff tear, subacromial decompression, distal clavical resection   TONSILLECTOMY     TOTAL KNEE ARTHROPLASTY Right 2005   right   TOTAL KNEE REVISION Right 2007   right   Family History  Problem Relation Age of Onset   Cancer Mother        breast cancer   Hypertension Mother    Mental illness Mother        alz   Breast cancer Mother    Cancer Father        colon cancer   Hypertension Father    Cancer Maternal Aunt  breast cancer with mets   Social History   Tobacco Use   Smoking status: Never Smoker   Smokeless tobacco: Never Used  Substance Use Topics   Alcohol use: Yes    Comment: rare   Drug use: No    ROS Constitutional: Denies fever, chills, weight loss/gain, headaches, insomnia,  night sweats, and change in appetite. Does c/o fatigue. Eyes: Denies redness, blurred vision, diplopia, discharge, itchy, watery eyes.  ENT: Denies discharge, congestion, post nasal drip, epistaxis, sore throat, earache, hearing loss, dental pain, Tinnitus, Vertigo, Sinus pain, snoring.  Cardio: Denies chest pain, palpitations, irregular heartbeat, syncope, dyspnea, diaphoresis, orthopnea, PND, claudication, edema Respiratory: denies cough, dyspnea, DOE, pleurisy, hoarseness, laryngitis, wheezing.  Gastrointestinal: Denies dysphagia, heartburn, reflux, water brash, pain, cramps, nausea, vomiting, bloating, diarrhea, constipation, hematemesis, melena, hematochezia, jaundice, hemorrhoids Genitourinary: Denies dysuria, frequency, urgency, nocturia, hesitancy, discharge, hematuria, flank pain Breast: Breast lumps, nipple discharge, bleeding.  Musculoskeletal: Denies arthralgia, myalgia, stiffness, Jt. Swelling, pain, limp, and strain/sprain. Denies falls. Skin: Denies puritis, rash, hives, warts, acne, eczema, changing in skin lesion Neuro: No weakness, tremor, incoordination, spasms, paresthesia, pain Psychiatric: Denies  confusion, memory loss, sensory loss. Denies Depression. Endocrine: Denies change in weight, skin, hair change, nocturia, and paresthesia, diabetic polys, visual blurring, hyper / hypo glycemic episodes.  Heme/Lymph: No excessive bleeding, bruising, enlarged lymph nodes.  Physical Exam  BP 136/74    Pulse 64    Temp (!) 97.2 F (36.2 C)    Resp 16    Ht 5' 1.75" (1.568 m)    Wt 143 lb 3.2 oz (65 kg)    BMI 26.40 kg/m   General Appearance: Well nourished, well groomed and in no apparent distress.  Eyes: PERRLA, EOMs, conjunctiva no swelling or erythema, normal fundi and vessels. Sinuses: No frontal/maxillary tenderness ENT/Mouth: EACs patent / TMs  nl. Nares clear without erythema, swelling, mucoid exudates. Oral hygiene is good. No erythema, swelling, or exudate. Tongue normal, non-obstructing. Tonsils not swollen or erythematous. Hearing normal.  Neck: Supple, thyroid not palpable. No bruits, nodes or JVD. Respiratory: Respiratory effort normal.  BS equal and clear bilateral without rales, rhonci, wheezing or stridor. Cardio: Heart sounds are normal with regular rate and rhythm and no murmurs, rubs or gallops. Peripheral pulses are normal and equal bilaterally without edema. No aortic or femoral bruits. Chest: symmetric with normal excursions and percussion. Breasts: Symmetric, without lumps, nipple discharge, retractions, or fibrocystic changes.  Abdomen: Flat, soft with bowel sounds active. Nontender, no guarding, rebound, hernias, masses, or organomegaly.  Lymphatics: Non tender without lymphadenopathy.  Musculoskeletal: Full ROM all peripheral extremities, joint stability, 5/5 strength, and normal gait. Skin: Warm and dry without rashes, lesions, cyanosis, clubbing or  ecchymosis.  Neuro: Cranial nerves intact, reflexes equal bilaterally. Normal muscle tone, no cerebellar symptoms. Sensation intact.  Pysch: Alert and oriented X 3, normal affect, Insight and Judgment appropriate.    Assessment and Plan  1. Essential hypertension  - EKG 12-Lead - Urinalysis, Routine w reflex microscopic - Microalbumin / creatinine urine ratio - CBC with Differential/Platelet - COMPLETE METABOLIC PANEL WITH GFR - Magnesium - TSH  2. Hyperlipidemia associated with type 2 diabetes mellitus ([facility])  - EKG 12-Lead - Lipid panel - TSH  3. Type 2 diabetes mellitus with stage 3 chronic kidney disease, without long-term current use of insulin ([facility])  - EKG 12-Lead - Urinalysis, Routine w reflex microscopic - Microalbumin / creatinine urine ratio - HM DIABETES FOOT EXAM - LOW EXTREMITY NEUR EXAM DOCUM - Hemoglobin  A1c - Insulin, random  4. Vitamin D deficiency  - VITAMIN D 25 Hydroxy  5. Hypothyroidism  - TSH  6. Gastroesophageal reflux disease  - CBC with Differential/Platelet  7. Screening for colorectal cancer  - POC Hemoccult Bld/Stl  8. Screening for ischemic heart disease  - EKG 12-Lead  9. FH: hypertension  - EKG 12-Lead  10. Medication management  - Urinalysis, Routine w reflex microscopic - Microalbumin / creatinine urine ratio - CBC with Differential/Platelet - COMPLETE METABOLIC PANEL WITH GFR - Magnesium - Lipid panel - TSH - Hemoglobin A1c - Insulin, random - VITAMIN D 25 Hydroxyl         Patient was counseled in prudent diet to achieve/maintain BMI less than 25 for weight control, BP monitoring, regular exercise and medications. Discussed med's effects and SE's. Screening labs and tests as requested with regular follow-up as recommended. Over 40 minutes of exam, counseling, chart review and high complex critical decision making was performed.   Marinus MawWilliam D Vittorio Mohs, MD

## 2019-08-07 NOTE — Patient Instructions (Signed)
Vit D  & Vit C 1,000 mg   are recommended to help protect  against the Covid-19 and other Corona viruses.    Also it's recommended  to take  Zinc 50 mg  to help  protect against the Covid-19   and best place to get  is also on [email or web address]  and don't pay more than 6-8 cents /pill !  ================================ Coronavirus (COVID-19) Are you at risk?  Are you at risk for the Coronavirus (COVID-19)?  To be considered HIGH RISK for Coronavirus (COVID-19), you have to meet the following criteria:  . Traveled to [place], [place], [place], [place] or [place]; or in the [place] to [place], [place], [place]  . or [place]; and have fever, cough, and shortness of breath within the last 2 weeks of travel OR . Been in close contact with a person diagnosed with COVID-19 within the last 2 weeks and have  . fever, cough,and shortness of breath .  . IF YOU DO NOT MEET THESE CRITERIA, YOU ARE CONSIDERED LOW RISK FOR COVID-19.  What to do if you are HIGH RISK for COVID-19?  [name] Kitchen If you are having a medical emergency, call 911. . Seek medical care right away. Before you go to a doctor's office, urgent care or emergency department, .  call ahead and tell them about your recent travel, contact with someone diagnosed with COVID-19  .  and your symptoms.  . You should receive instructions from your physician's office regarding next steps of care.  . When you arrive at healthcare provider, tell the healthcare staff immediately you have returned from  . visiting [place], [place], [place], [place] or [place]; or traveled in the [place] to [place], [place],  . [place] or [place] in the last two weeks or you have been in close contact with a person diagnosed with  . COVID-19 in the last 2 weeks.   . Tell the health care staff about your symptoms: fever, cough and shortness of breath. . After you have been seen by a medical provider, you will be either: o Tested for (COVID-19) and  discharged home on quarantine except to seek medical care if  o symptoms worsen, and asked to  - Stay home and avoid contact with others until you get your results (4-5 days)  - Avoid travel on public transportation if possible (such as bus, train, or airplane) or o Sent to the Emergency Department by EMS for evaluation, COVID-19 testing  and  o possible admission depending on your condition and test results.  What to do if you are LOW RISK for COVID-19?  Reduce your risk of any infection by using the same precautions used for avoiding the common cold or flu:  [name] Kitchen Wash your hands often with soap and warm water for at least 20 seconds.  If soap and water are not readily available,  . use an alcohol-based hand sanitizer with at least 60% alcohol.  . If coughing or sneezing, cover your mouth and nose by coughing or sneezing into the elbow areas of your shirt or coat, .  into a tissue or into your sleeve (not your hands). . Avoid shaking hands with others and consider head nods or verbal greetings only. . Avoid touching your eyes, nose, or mouth with unwashed hands.  . Avoid close contact with people who are sick. . Avoid places or events with large numbers of people in one location, like concerts or sporting events. [name] Kitchen  Carefully consider travel plans you have or are making. . If you are planning any travel outside or inside the [place], visit the CDC's Travelers' Health webpage for the latest health notices. . If you have some symptoms but not all symptoms, continue to monitor at home and seek medical attention  . if your symptoms worsen. . If you are having a medical emergency, call 911.   . >>>>>>>>>>>>>>>>>>>>>>>>>>>>>>>>> . We Do NOT Approve of  [facility], [employer]  Our Patients  To Do Home Visits & We Do NOT Approve of [employer] > > > > > > > > > > > > > > > > > > > > > > > > > > > > > > > > > > > > > > >  Preventive Care for Adults  A healthy lifestyle  and preventive care can promote health and wellness. Preventive health guidelines for women include the following key practices.  A routine yearly physical is a good way to check with your health care provider about your health and preventive screening. It is a chance to share any concerns and updates on your health and to receive a thorough exam.  Visit your dentist for a routine exam and preventive care every 6 months. Brush your teeth twice a day and floss once a day. Good oral hygiene prevents tooth decay and gum disease.  The frequency of eye exams is based on your age, health, family medical history, use of contact lenses, and other factors. Follow your health care provider's recommendations for frequency of eye exams.  Eat a healthy diet. Foods like vegetables, fruits, whole grains, low-fat dairy products, and lean protein foods contain the nutrients you need without too many calories. Decrease your intake of foods high in solid fats, added sugars, and salt. Eat the right amount of calories for you. Get information about a proper diet from your health care provider, if necessary.  Regular physical exercise is one of the most important things you can do for your health. Most adults should get at least 150 minutes of moderate-intensity exercise (any activity that increases your heart rate and causes you to sweat) each week. In addition, most adults need muscle-strengthening exercises on 2 or more days a week.  Maintain a healthy weight. The body mass index (BMI) is a screening tool to identify possible weight problems. It provides an estimate of body fat based on height and weight. Your health care provider can find your BMI and can help you achieve or maintain a healthy weight. For adults 20 years and older:  A BMI below 18.5 is considered underweight.  A BMI of 18.5 to 24.9 is normal.  A BMI of 25 to 29.9 is considered overweight.  A BMI of 30 and above is considered obese.  Maintain  normal blood lipids and cholesterol levels by exercising and minimizing your intake of saturated fat. Eat a balanced diet with plenty of fruit and vegetables. If your lipid or cholesterol levels are high, you are over 50, or you are at high risk for heart disease, you may need your cholesterol levels checked more frequently. Ongoing high lipid and cholesterol levels should be treated with medicines if diet and exercise are not working.  If you smoke, find out from your health care provider how to quit. If you do not use tobacco, do not start.  Lung cancer screening is recommended for adults aged 34-80 years who are at high risk for developing lung cancer  because of a history of smoking. A yearly low-dose CT scan of the lungs is recommended for people who have at least a 30-pack-year history of smoking and are a current smoker or have quit within the past 15 years. A pack year of smoking is smoking an average of 1 pack of cigarettes a day for 1 year (for example: 1 pack a day for 30 years or 2 packs a day for 15 years). Yearly screening should continue until the smoker has stopped smoking for at least 15 years. Yearly screening should be stopped for people who develop a health problem that would prevent them from having lung cancer treatment.  Avoid use of street drugs. Do not share needles with anyone. Ask for help if you need support or instructions about stopping the use of drugs.  High blood pressure causes heart disease and increases the risk of stroke.  Ongoing high blood pressure should be treated with medicines if weight loss and exercise do not work.  If you are 66-18 years old, ask your health care provider if you should take aspirin to prevent strokes.  Diabetes screening involves taking a blood sample to check your fasting blood sugar level. This should be done once every 3 years, after age 35, if you are within normal weight and without risk factors for diabetes. Testing should be considered  at a younger age or be carried out more frequently if you are overweight and have at least 1 risk factor for diabetes.  Breast cancer screening is essential preventive care for women. You should practice "breast self-awareness." This means understanding the normal appearance and feel of your breasts and may include breast self-examination. Any changes detected, no matter how small, should be reported to a health care provider. Women in their 74s and 30s should have a clinical breast exam (CBE) by a health care provider as part of a regular health exam every 1 to 3 years. After age 5, women should have a CBE every year. Starting at age 52, women should consider having a mammogram (breast X-ray test) every year. Women who have a family history of breast cancer should talk to their health care provider about genetic screening. Women at a high risk of breast cancer should talk to their health care providers about having an MRI and a mammogram every year.  Breast cancer gene (BRCA)-related cancer risk assessment is recommended for women who have family members with BRCA-related cancers. BRCA-related cancers include breast, ovarian, tubal, and peritoneal cancers. Having family members with these cancers may be associated with an increased risk for harmful changes (mutations) in the breast cancer genes BRCA1 and BRCA2. Results of the assessment will determine the need for genetic counseling and BRCA1 and BRCA2 testing.  Routine pelvic exams to screen for cancer are no longer recommended for nonpregnant women who are considered low risk for cancer of the pelvic organs (ovaries, uterus, and vagina) and who do not have symptoms. Ask your health care provider if a screening pelvic exam is right for you.  If you have had past treatment for cervical cancer or a condition that could lead to cancer, you need Pap tests and screening for cancer for at least 20 years after your treatment. If Pap tests have been discontinued,  your risk factors (such as having a new sexual partner) need to be reassessed to determine if screening should be resumed. Some women have medical problems that increase the chance of getting cervical cancer. In these cases, your health care  provider may recommend more frequent screening and Pap tests.    Colorectal cancer can be detected and often prevented. Most routine colorectal cancer screening begins at the age of 12 years and continues through age 30 years. However, your health care provider may recommend screening at an earlier age if you have risk factors for colon cancer. On a yearly basis, your health care provider may provide home test kits to check for hidden blood in the stool. Use of a small camera at the end of a tube, to directly examine the colon (sigmoidoscopy or colonoscopy), can detect the earliest forms of colorectal cancer. Talk to your health care provider about this at age 20, when routine screening begins.  Direct exam of the colon should be repeated every 5-10 years through age 68 years, unless early forms of pre-cancerous polyps or small growths are found.  Osteoporosis is a disease in which the bones lose minerals and strength with aging. This can result in serious bone fractures or breaks. The risk of osteoporosis can be identified using a bone density scan. Women ages 11 years and over and women at risk for fractures or osteoporosis should discuss screening with their health care providers. Ask your health care provider whether you should take a calcium supplement or vitamin D to reduce the rate of osteoporosis.  Menopause can be associated with physical symptoms and risks. Hormone replacement therapy is available to decrease symptoms and risks. You should talk to your health care provider about whether hormone replacement therapy is right for you.  Use sunscreen. Apply sunscreen liberally and repeatedly throughout the day. You should seek shade when your shadow is shorter  than you. Protect yourself by wearing long sleeves, pants, a wide-brimmed hat, and sunglasses year round, whenever you are outdoors.  Once a month, do a whole body skin exam, using a mirror to look at the skin on your back. Tell your health care provider of new moles, moles that have irregular borders, moles that are larger than a pencil eraser, or moles that have changed in shape or color.  Stay current with required vaccines (immunizations).  Influenza vaccine. All adults should be immunized every year.  Tetanus, diphtheria, and acellular pertussis (Td, Tdap) vaccine. Pregnant women should receive 1 dose of Tdap vaccine during each pregnancy. The dose should be obtained regardless of the length of time since the last dose. Immunization is preferred during the 27th-36th week of gestation. An adult who has not previously received Tdap or who does not know her vaccine status should receive 1 dose of Tdap. This initial dose should be followed by tetanus and diphtheria toxoids (Td) booster doses every 10 years. Adults with an unknown or incomplete history of completing a 3-dose immunization series with Td-containing vaccines should begin or complete a primary immunization series including a Tdap dose. Adults should receive a Td booster every 10 years.    Zoster vaccine. One dose is recommended for adults aged 25 years or older unless certain conditions are present.    Pneumococcal 13-valent conjugate (PCV13) vaccine. When indicated, a person who is uncertain of her immunization history and has no record of immunization should receive the PCV13 vaccine. An adult aged 77 years or older who has certain medical conditions and has not been previously immunized should receive 1 dose of PCV13 vaccine. This PCV13 should be followed with a dose of pneumococcal polysaccharide (PPSV23) vaccine. The PPSV23 vaccine dose should be obtained at least 1 or more year(s) after the dose of  PCV13 vaccine. An adult aged 62  years or older who has certain medical conditions and previously received 1 or more doses of PPSV23 vaccine should receive 1 dose of PCV13. The PCV13 vaccine dose should be obtained 1 or more years after the last PPSV23 vaccine dose.    Pneumococcal polysaccharide (PPSV23) vaccine. When PCV13 is also indicated, PCV13 should be obtained first. All adults aged 80 years and older should be immunized. An adult younger than age 58 years who has certain medical conditions should be immunized. Any person who resides in a nursing home or long-term care facility should be immunized. An adult smoker should be immunized. People with an immunocompromised condition and certain other conditions should receive both PCV13 and PPSV23 vaccines. People with human immunodeficiency virus (HIV) infection should be immunized as soon as possible after diagnosis. Immunization during chemotherapy or radiation therapy should be avoided. Routine use of PPSV23 vaccine is not recommended for American Indians, [place] Natives, or people younger than 65 years unless there are medical conditions that require PPSV23 vaccine. When indicated, people who have unknown immunization and have no record of immunization should receive PPSV23 vaccine. One-time revaccination 5 years after the first dose of PPSV23 is recommended for people aged 19-64 years who have chronic kidney failure, nephrotic syndrome, asplenia, or immunocompromised conditions. People who received 1-2 doses of PPSV23 before age 19 years should receive another dose of PPSV23 vaccine at age 41 years or later if at least 5 years have passed since the previous dose. Doses of PPSV23 are not needed for people immunized with PPSV23 at or after age 46 years.   Preventive Services / Frequency  Ages 27 years and over  Blood pressure check.  Lipid and cholesterol check.  Lung cancer screening. / Every year if you are aged 108-80 years and have a 30-pack-year history of smoking and  currently smoke or have quit within the past 15 years. Yearly screening is stopped once you have quit smoking for at least 15 years or develop a health problem that would prevent you from having lung cancer treatment.  Clinical breast exam.** / Every year after age 46 years.   BRCA-related cancer risk assessment.** / For women who have family members with a BRCA-related cancer (breast, ovarian, tubal, or peritoneal cancers).  Mammogram.** / Every year beginning at age 6 years and continuing for as long as you are in good health. Consult with your health care provider.  Pap test.** / Every 3 years starting at age 46 years through age 69 or 74 years with 3 consecutive normal Pap tests. Testing can be stopped between 65 and 70 years with 3 consecutive normal Pap tests and no abnormal Pap or HPV tests in the past 10 years.  Fecal occult blood test (FOBT) of stool. / Every year beginning at age 51 years and continuing until age 13 years. You may not need to do this test if you get a colonoscopy every 10 years.  Flexible sigmoidoscopy or colonoscopy.** / Every 5 years for a flexible sigmoidoscopy or every 10 years for a colonoscopy beginning at age 13 years and continuing until age 32 years.  Hepatitis C blood test.** / For all people born from [birth year] through 1963 and any individual with known risks for hepatitis C.  Osteoporosis screening.** / A one-time screening for women ages 1 years and over and women at risk for fractures or osteoporosis.  Skin self-exam. / Monthly.  Influenza vaccine. / Every year.  Tetanus, diphtheria, and acellular  pertussis (Tdap/Td) vaccine.** / 1 dose of Td every 10 years.  Zoster vaccine.** / 1 dose for adults aged 60 years or older.  Pneumococcal 13-valent conjugate (PCV13) vaccine.** / Consult your health care provider.  Pneumococcal polysaccharide (PPSV23) vaccine.** / 1 dose for all adults aged 65 years and older. Screening for abdominal aortic aneurysm (AAA)   by ultrasound is recommended for people who have history of high blood pressure or who are current or former smokers. ++++++++++++++++++++ Recommend Adult Low Dose Aspirin or  coated  Aspirin 81 mg daily  To reduce risk of Colon Cancer 40 %,  Skin Cancer 26 % ,  Melanoma 46%  and  Pancreatic cancer 60% ++++++++++++++++++++ Vitamin D goal  is between 70-100.  Please make sure that you are taking your Vitamin D as directed.  It is very important as a natural anti-inflammatory  helping hair, skin, and nails, as well as reducing stroke and heart attack risk.  It helps your bones and helps with mood. It also decreases numerous cancer risks so please take it as directed.  Low Vit D is associated with a 200-300% higher risk for CANCER  and 200-300% higher risk for HEART   ATTACK  &  STROKE.   ...................................... It is also associated with higher death rate at younger ages,  autoimmune diseases like Rheumatoid arthritis, Lupus, Multiple Sclerosis.    Also many other serious conditions, like depression, Alzheimer's Dementia, infertility, muscle aches, fatigue, fibromyalgia - just to name a few. ++++++++++++++++++ Recommend the book "The END of DIETING" by Dr Joel Fuhrman  & the book "The END of DIABETES " by Dr Joel Fuhrman At [employer]com - get book & Audio CD's    Being diabetic has a  300% increased risk for heart attack, stroke, cancer, and alzheimer- type vascular dementia. It is very important that you work harder with diet by avoiding all foods that are white. Avoid white rice (brown & wild rice is OK), white potatoes (sweetpotatoes in moderation is OK), White bread or wheat bread or anything made out of white flour like bagels, donuts, rolls, buns, biscuits, cakes, pastries, cookies, pizza crust, and pasta (made from white flour & egg whites) - vegetarian pasta or spinach or wheat pasta is OK. Multigrain breads like Arnold's or [employer], or multigrain sandwich thins  or flatbreads.  Diet, exercise and weight loss can reverse and cure diabetes in the early stages.  Diet, exercise and weight loss is very important in the control and prevention of complications of diabetes which affects every system in your body, ie. Brain - dementia/stroke, eyes - glaucoma/blindness, heart - heart attack/heart failure, kidneys - dialysis, stomach - gastric paralysis, intestines - malabsorption, nerves - severe painful neuritis, circulation - gangrene & loss of a leg(s), and finally cancer and Alzheimers.    I recommend avoid fried & greasy foods,  sweets/candy, white rice (brown or wild rice or Quinoa is OK), white potatoes (sweet potatoes are OK) - anything made from white flour - bagels, doughnuts, rolls, buns, biscuits,white and wheat breads, pizza crust and traditional pasta made of white flour & egg white(vegetarian pasta or spinach or wheat pasta is OK).  Multi-grain bread is OK - like multi-grain flat bread or sandwich thins. Avoid alcohol in excess. Exercise is also important.    Eat all the vegetables you want - avoid meat, especially red meat and dairy - especially cheese.  Cheese is the most concentrated form of trans-fats which is the worst thing to clog   up our arteries. Veggie cheese is OK which can be found in the fresh produce section at [employer] or [employer] or [employer]  +++++++++++++++++++ DASH Eating Plan  DASH stands for "Dietary Approaches to Stop Hypertension."   The DASH eating plan is a healthy eating plan that has been shown to reduce high blood pressure (hypertension). Additional health benefits may include reducing the risk of type 2 diabetes mellitus, heart disease, and stroke. The DASH eating plan may also help with weight loss. WHAT DO I NEED TO KNOW ABOUT THE DASH EATING PLAN? For the DASH eating plan, you will follow these general guidelines:  Choose foods with a percent daily value for sodium of less than 5% (as listed on the food  label).  Use salt-free seasonings or herbs instead of table salt or sea salt.  Check with your health care provider or pharmacist before using salt substitutes.  Eat lower-sodium products, often labeled as "lower sodium" or "no salt added."  Eat fresh foods.  Eat more vegetables, fruits, and low-fat dairy products.  Choose whole grains. Look for the word "whole" as the first word in the ingredient list.  Choose fish   Limit sweets, desserts, sugars, and sugary drinks.  Choose heart-healthy fats.  Eat veggie cheese   Eat more home-cooked food and less restaurant, buffet, and fast food.  Limit fried foods.  Cook foods using methods other than frying.  Limit canned vegetables. If you do use them, rinse them well to decrease the sodium.  When eating at a restaurant, ask that your food be prepared with less salt, or no salt if possible.                      WHAT FOODS CAN I EAT? Read Dr Fara Olden Fuhrman's books on The End of Dieting & The End of Diabetes  Grains Whole grain or whole wheat bread. Brown rice. Whole grain or whole wheat pasta. Quinoa, bulgur, and whole grain cereals. Low-sodium cereals. Corn or whole wheat flour tortillas. Whole grain cornbread. Whole grain crackers. Low-sodium crackers.  Vegetables Fresh or frozen vegetables (raw, steamed, roasted, or grilled). Low-sodium or reduced-sodium tomato and vegetable juices. Low-sodium or reduced-sodium tomato sauce and paste. Low-sodium or reduced-sodium canned vegetables.   Fruits All fresh, canned (in natural juice), or frozen fruits.  Protein Products  All fish and seafood.  Dried beans, peas, or lentils. Unsalted nuts and seeds. Unsalted canned beans.  Dairy Low-fat dairy products, such as skim or 1% milk, 2% or reduced-fat cheeses, low-fat ricotta or cottage cheese, or plain low-fat yogurt. Low-sodium or reduced-sodium cheeses.  Fats and Oils Tub margarines without trans fats. Light or reduced-fat mayonnaise  and salad dressings (reduced sodium). Avocado. Safflower, olive, or canola oils. Natural peanut or almond butter.  Other Unsalted popcorn and pretzels. The items listed above may not be a complete list of recommended foods or beverages. Contact your dietitian for more options.  +++++++++++++++  WHAT FOODS ARE NOT RECOMMENDED? Grains/ White flour or wheat flour White bread. White pasta. White rice. Refined cornbread. Bagels and croissants. Crackers that contain trans fat.  Vegetables  Creamed or fried vegetables. Vegetables in a . Regular canned vegetables. Regular canned tomato sauce and paste. Regular tomato and vegetable juices.  Fruits Dried fruits. Canned fruit in light or heavy syrup. Fruit juice.  Meat and Other Protein Products Meat in general - RED meat & White meat.  Fatty cuts of meat. Ribs, chicken wings, all processed meats as  bacon, sausage, bologna, salami, fatback, hot dogs, bratwurst and packaged luncheon meats.  Dairy Whole or 2% milk, cream, half-and-half, and cream cheese. Whole-fat or sweetened yogurt. Full-fat cheeses or blue cheese. Non-dairy creamers and whipped toppings. Processed cheese, cheese spreads, or cheese curds.  Condiments Onion and garlic salt, seasoned salt, table salt, and sea salt. Canned and packaged gravies. Worcestershire sauce. Tartar sauce. Barbecue sauce. Teriyaki sauce. Soy sauce, including reduced sodium. Steak sauce. Fish sauce. Oyster sauce. Cocktail sauce. Horseradish. Ketchup and mustard. Meat flavorings and tenderizers. Bouillon cubes. Hot sauce. Tabasco sauce. Marinades. Taco seasonings. Relishes.  Fats and Oils Butter, stick margarine, lard, shortening and bacon fat. Coconut, palm kernel, or palm oils. Regular salad dressings.  Pickles and olives. Salted popcorn and pretzels.  The items listed above may not be a complete list of foods and beverages to avoid.

## 2019-08-08 ENCOUNTER — Other Ambulatory Visit: Payer: Self-pay

## 2019-08-08 ENCOUNTER — Ambulatory Visit (INDEPENDENT_AMBULATORY_CARE_PROVIDER_SITE_OTHER): Payer: Medicare Other | Admitting: Internal Medicine

## 2019-08-08 ENCOUNTER — Encounter: Payer: Self-pay | Admitting: Internal Medicine

## 2019-08-08 VITALS — BP 136/74 | HR 64 | Temp 97.2°F | Resp 16 | Ht 61.75 in | Wt 143.2 lb

## 2019-08-08 DIAGNOSIS — Z8249 Family history of ischemic heart disease and other diseases of the circulatory system: Secondary | ICD-10-CM

## 2019-08-08 DIAGNOSIS — Z1211 Encounter for screening for malignant neoplasm of colon: Secondary | ICD-10-CM

## 2019-08-08 DIAGNOSIS — E1122 Type 2 diabetes mellitus with diabetic chronic kidney disease: Secondary | ICD-10-CM | POA: Diagnosis not present

## 2019-08-08 DIAGNOSIS — K219 Gastro-esophageal reflux disease without esophagitis: Secondary | ICD-10-CM

## 2019-08-08 DIAGNOSIS — Z79899 Other long term (current) drug therapy: Secondary | ICD-10-CM

## 2019-08-08 DIAGNOSIS — E559 Vitamin D deficiency, unspecified: Secondary | ICD-10-CM | POA: Diagnosis not present

## 2019-08-08 DIAGNOSIS — E785 Hyperlipidemia, unspecified: Secondary | ICD-10-CM

## 2019-08-08 DIAGNOSIS — E1169 Type 2 diabetes mellitus with other specified complication: Secondary | ICD-10-CM | POA: Diagnosis not present

## 2019-08-08 DIAGNOSIS — I10 Essential (primary) hypertension: Secondary | ICD-10-CM

## 2019-08-08 DIAGNOSIS — Z136 Encounter for screening for cardiovascular disorders: Secondary | ICD-10-CM

## 2019-08-08 DIAGNOSIS — E039 Hypothyroidism, unspecified: Secondary | ICD-10-CM | POA: Diagnosis not present

## 2019-08-08 DIAGNOSIS — N183 Chronic kidney disease, stage 3 unspecified: Secondary | ICD-10-CM

## 2019-08-08 DIAGNOSIS — Z1212 Encounter for screening for malignant neoplasm of rectum: Secondary | ICD-10-CM

## 2019-08-09 ENCOUNTER — Other Ambulatory Visit: Payer: Self-pay | Admitting: Internal Medicine

## 2019-08-09 LAB — URINALYSIS, ROUTINE W REFLEX MICROSCOPIC
Bacteria, UA: NONE SEEN /HPF
Bilirubin Urine: NEGATIVE
Glucose, UA: NEGATIVE
Hgb urine dipstick: NEGATIVE
Hyaline Cast: NONE SEEN /LPF
Ketones, ur: NEGATIVE
Nitrite: NEGATIVE
Protein, ur: NEGATIVE
RBC / HPF: NONE SEEN /HPF (ref 0–2)
Specific Gravity, Urine: 1.011 (ref 1.001–1.03)
Squamous Epithelial / HPF: NONE SEEN /HPF (ref <=5)
pH: 6 (ref 5.0–8.0)

## 2019-08-09 LAB — COMPLETE METABOLIC PANEL WITH GFR
AG Ratio: 2 (calc) (ref 1.0–2.5)
ALT: 11 U/L (ref 6–29)
AST: 18 U/L (ref 10–35)
Albumin: 4.4 g/dL (ref 3.6–5.1)
Alkaline phosphatase (APISO): 57 U/L (ref 37–153)
BUN/Creatinine Ratio: 15 (calc) (ref 6–22)
BUN: 14 mg/dL (ref 7–25)
CO2: 29 mmol/L (ref 20–32)
Calcium: 9.5 mg/dL (ref 8.6–10.4)
Chloride: 105 mmol/L (ref 98–110)
Creat: 0.92 mg/dL — ABNORMAL HIGH (ref 0.60–0.88)
GFR, Est African American: 66 mL/min/{1.73_m2} (ref >=60)
GFR, Est Non African American: 57 mL/min/{1.73_m2} — ABNORMAL LOW (ref >=60)
Globulin: 2.2 g/dL (calc) (ref 1.9–3.7)
Glucose, Bld: 84 mg/dL (ref 65–99)
Potassium: 4 mmol/L (ref 3.5–5.3)
Sodium: 143 mmol/L (ref 135–146)
Total Bilirubin: 0.4 mg/dL (ref 0.2–1.2)
Total Protein: 6.6 g/dL (ref 6.1–8.1)

## 2019-08-09 LAB — MICROALBUMIN / CREATININE URINE RATIO
Creatinine, Urine: 43 mg/dL (ref 20–275)
Microalb Creat Ratio: 42 mcg/mg creat — ABNORMAL HIGH (ref <30)
Microalb, Ur: 1.8 mg/dL

## 2019-08-09 LAB — INSULIN, RANDOM: Insulin: 3.8 u[IU]/mL

## 2019-08-09 LAB — CBC WITH DIFFERENTIAL/PLATELET
Absolute Monocytes: 530 cells/uL (ref 200–950)
Basophils Absolute: 94 cells/uL (ref 0–200)
Basophils Relative: 1.2 %
Eosinophils Absolute: 242 cells/uL (ref 15–500)
Eosinophils Relative: 3.1 %
HCT: 36.1 % (ref 35.0–45.0)
Hemoglobin: 11.7 g/dL (ref 11.7–15.5)
Lymphs Abs: 2332 cells/uL (ref 850–3900)
MCH: 30.5 pg (ref 27.0–33.0)
MCHC: 32.4 g/dL (ref 32.0–36.0)
MCV: 94 fL (ref 80.0–100.0)
MPV: 10.2 fL (ref 7.5–12.5)
Monocytes Relative: 6.8 %
Neutro Abs: 4602 cells/uL (ref 1500–7800)
Neutrophils Relative %: 59 %
Platelets: 283 10*3/uL (ref 140–400)
RBC: 3.84 10*6/uL (ref 3.80–5.10)
RDW: 12.1 % (ref 11.0–15.0)
Total Lymphocyte: 29.9 %
WBC: 7.8 10*3/uL (ref 3.8–10.8)

## 2019-08-09 LAB — LIPID PANEL
Cholesterol: 143 mg/dL (ref <200)
HDL: 54 mg/dL (ref >=50)
LDL Cholesterol (Calc): 67 mg/dL (calc)
Non-HDL Cholesterol (Calc): 89 mg/dL (calc) (ref <130)
Total CHOL/HDL Ratio: 2.6 (calc) (ref <5.0)
Triglycerides: 136 mg/dL (ref <150)

## 2019-08-09 LAB — HEMOGLOBIN A1C
Hgb A1c MFr Bld: 6.5 % of total Hgb — ABNORMAL HIGH (ref <5.7)
Mean Plasma Glucose: 140 (calc)
eAG (mmol/L): 7.7 (calc)

## 2019-08-09 LAB — MAGNESIUM: Magnesium: 1.8 mg/dL (ref 1.5–2.5)

## 2019-08-09 LAB — VITAMIN D 25 HYDROXY (VIT D DEFICIENCY, FRACTURES): Vit D, 25-Hydroxy: 70 ng/mL (ref 30–100)

## 2019-08-09 LAB — TSH: TSH: 0.79 mIU/L (ref 0.40–4.50)

## 2019-08-09 MED ORDER — TRAZODONE HCL 150 MG PO TABS: ORAL_TABLET | ORAL | 1 refills | Status: AC

## 2019-08-15 ENCOUNTER — Ambulatory Visit (INDEPENDENT_AMBULATORY_CARE_PROVIDER_SITE_OTHER): Payer: Medicare Other

## 2019-08-15 ENCOUNTER — Other Ambulatory Visit: Payer: Self-pay

## 2019-08-15 VITALS — Temp 97.5°F

## 2019-08-15 DIAGNOSIS — Z23 Encounter for immunization: Secondary | ICD-10-CM | POA: Diagnosis not present

## 2019-08-15 NOTE — Progress Notes (Signed)
Patient presents to the office forHDFlu Vaccine. Vaccine administered toLEFTDeltoid withoutanycomplication. Temperature taken and recorded 

## 2019-08-29 ENCOUNTER — Other Ambulatory Visit: Payer: Self-pay

## 2019-08-29 DIAGNOSIS — Z1212 Encounter for screening for malignant neoplasm of rectum: Secondary | ICD-10-CM | POA: Diagnosis not present

## 2019-08-29 DIAGNOSIS — Z1211 Encounter for screening for malignant neoplasm of colon: Secondary | ICD-10-CM | POA: Diagnosis not present

## 2019-08-29 LAB — POC HEMOCCULT BLD/STL (HOME/3-CARD/SCREEN)
Card #2 Fecal Occult Blod, POC: NEGATIVE
Card #3 Fecal Occult Blood, POC: NEGATIVE
Fecal Occult Blood, POC: NEGATIVE

## 2019-09-07 ENCOUNTER — Other Ambulatory Visit: Payer: Self-pay | Admitting: *Deleted

## 2019-09-08 ENCOUNTER — Other Ambulatory Visit: Payer: Self-pay | Admitting: Internal Medicine

## 2019-09-08 MED ORDER — HYDROXYZINE HCL 10 MG PO TABS: ORAL_TABLET | ORAL | 0 refills | Status: AC

## 2019-10-03 ENCOUNTER — Other Ambulatory Visit: Payer: Self-pay

## 2019-10-03 ENCOUNTER — Encounter: Payer: Self-pay | Admitting: Physician Assistant

## 2019-10-03 ENCOUNTER — Ambulatory Visit (INDEPENDENT_AMBULATORY_CARE_PROVIDER_SITE_OTHER): Payer: Medicare Other | Admitting: Physician Assistant

## 2019-10-03 VITALS — BP 138/80 | HR 67 | Temp 97.7°F | Wt 145.0 lb

## 2019-10-03 DIAGNOSIS — M5442 Lumbago with sciatica, left side: Secondary | ICD-10-CM | POA: Diagnosis not present

## 2019-10-03 DIAGNOSIS — G8929 Other chronic pain: Secondary | ICD-10-CM

## 2019-10-03 MED ORDER — DEXAMETHASONE SODIUM PHOSPHATE 100 MG/10ML IJ SOLN: 10.0000 mg | Freq: Once | INTRAMUSCULAR | Status: DC

## 2019-10-03 NOTE — Patient Instructions (Signed)
Take the gabapentin about 1-2 hours before bed Just do 1 and if that does not help, can increase to 2 pills before bed.   Follow up with Dr. Jonni Sanger  Suggest PT   Spinal Stenosis  Spinal stenosis occurs when the open space (spinal canal) between the bones of your spine (vertebrae) narrows, putting pressure on the spinal cord or nerves. What are the causes? This condition is caused by areas of bone pushing into the central canals of your vertebrae. This condition may be present at birth (congenital), or it may be caused by:  Arthritic deterioration of your vertebrae (spinal degeneration). This usually starts around age 36.  Injury or trauma to the spine.  Tumors in the spine.  Calcium deposits in the spine. What are the signs or symptoms? Symptoms of this condition include:  Pain in the neck or back that is generally worse with activities, particularly when standing and walking.  Numbness, tingling, hot or cold sensations, weakness, or weariness in your legs.  Pain going up and down the leg (sciatica).  Frequent episodes of falling.  A foot-slapping gait that leads to muscle weakness. In more serious cases, you may develop:  Problemspassing stool or passing urine.  Difficulty having sex.  Loss of feeling in part or all of your leg. Symptoms may come on slowly and get worse over time. How is this diagnosed? This condition is diagnosed based on your medical history and a physical exam. Tests will also be done, such as:  MRI.  CT scan.  X-ray. How is this treated? Treatment for this condition often focuses on managing your pain and any other symptoms. Treatment may include:  Practicing good posture to lessen pressure on your nerves.  Exercising to strengthen muscles, build endurance, improve balance, and maintain good joint movement (range of motion).  Losing weight, if needed.  Taking medicines to reduce swelling, inflammation, or pain.  Assistive devices,  such as a corset or brace. In some cases, surgery may be needed. The most common procedure is decompression laminectomy. This is done to remove excess bone that puts pressure on your nerve roots. Follow these instructions at home: Managing pain, stiffness, and swelling  Do all exercises and stretches as told by your health care provider.  Practice good posture. If you were given a brace or a corset, wear it as told by your health care provider.  Do not do any activities that cause pain. Ask your health care provider what activities are safe for you.  Do not lift anything that is heavier than 10 lb (4.5 kg) or the limit that your health care provider tells you.  Maintain a healthy weight. Talk with your health care provider if you need help losing weight.  If directed, apply heat to the affected area as often as told by your health care provider. Use the heat source that your health care provider recommends, such as a moist heat pack or a heating pad. ? Place a towel between your skin and the heat source. ? Leave the heat on for 20-30 minutes. ? Remove the heat if your skin turns bright red. This is especially important if you are not able to feel pain, heat, or cold. You may have a greater risk of getting burned. General instructions  Take over-the-counter and prescription medicines only as told by your health care provider.  Do not use any products that contain nicotine or tobacco, such as cigarettes and e-cigarettes. If you need help quitting, ask your health  care provider.  Eat a healthy diet. This includes plenty of fruits and vegetables, whole grains, and low-fat (lean) protein.  Keep all follow-up visits as told by your health care provider. This is important. Contact a health care provider if:  Your symptoms do not get better or they get worse.  You have a fever. Get help right away if:  You have new or worse pain in your neck or upper back.  You have severe pain that  cannot be controlled with medicines.  You are dizzy.  You have vision problems, blurred vision, or double vision.  You have a severe headache that is worse when you stand.  You have nausea or you vomit.  You develop new or worse numbness or tingling in your back or legs.  You have pain, redness, swelling, or warmth in your arm or leg. Summary  Spinal stenosis occurs when the open space (spinal canal) between the bones of your spine (vertebrae) narrows. This narrowing puts pressure on the spinal cord or nerves.  Spinal stenosis can cause numbness, weakness, or pain in the neck, back, and legs.  This condition may be caused by a birth defect, arthritic deterioration of your vertebrae, injury, tumors, or calcium deposits.  This condition is usually diagnosed with MRIs, CT scans, and X-rays. This information is not intended to replace advice given to you by your health care provider. Make sure you discuss any questions you have with your health care provider. Document Released: 01/17/2004 Document Revised: 10/09/2017 Document Reviewed: 10/01/2016 Elsevier Patient Education  2020 [employer].

## 2019-10-03 NOTE — Progress Notes (Signed)
Subjective:    Patient ID: [name], female    DOB: 01-30-1933, 85 y.o.   MRN: [id number]  HPI 85 y.o. WF with history of depression, osteopenia, GERD, HTN, chol, hypothyroidism, DM and CKD stage 3 presents with leg pain x 83-4 months and getting progressively worse.  She states at 83-3 in the morning she will roll over and have left SI/back pain down her lateral leg to her ankle. She states it is worse in the morning as well, hard to move.  Has tried patches and Bengay that does not help. She states her legs have been wobbly.    She had back surgery L4-L5 about 3 years ago, at [facility], Dr. Lynann Bologna.   XR lumbar 12/2015 She is on gabapentin 300mg  1 at night right before bed- 10-11 She is on seroquel and trazadone.  Patient denies fever, hematuria, incontinence, numbness, tingling, weakness and saddle anesthesia. No swelling, warmth, redness in her legs.    Blood pressure 138/80, pulse 67, temperature 97.7 F (36.5 C), weight 145 lb (65.8 kg), SpO2 95 %.  Medications  Current Outpatient Medications (Endocrine & Metabolic):  .  levothyroxine (SYNTHROID) 100 MCG tablet, Take 1 tablet daily on an empty stomach with only water for 30 minutes & no Antacid meds, Calcium or Magnesium for 4 hours & avoid Biotin .  metFORMIN (GLUCOPHAGE-XR) 500 MG 24 hr tablet, Take 2 tablets 2 x /day after meals  for Diabetes  Current Outpatient Medications (Cardiovascular):  .  atenolol (TENORMIN) 100 MG tablet, Take 1 tablet every Morning for BP .  atorvastatin (LIPITOR) 80 MG tablet, TAKE 1 TABLET BY MOUTH EVERY DAY .  furosemide (LASIX) 40 MG tablet, take 1 tablet by mouth once daily .  losartan (COZAAR) 100 MG tablet, TAKE 1 TABLET BY MOUTH ONCE DAILY .  mexiletine (MEXITIL) 150 MG capsule, TAKE 1 CAPSULE BY MOUTH THREE TIMES DAILY .  verapamil (VERELAN PM) 120 MG 24 hr capsule, TAKE ONE CAPSULE BY MOUTH TWICE DAILY WITH FOOD .  minoxidil (LONITEN) 10 MG tablet, Take 1/2 to 1 tablet daily for  BP   Current Outpatient Medications (Analgesics):  .  aspirin 81 MG tablet, Take 81 mg by mouth daily.   Current Outpatient Medications (Other):  .  Biotin 10 MG CAPS, Take 1 tablet by mouth daily.  .  cholecalciferol (VITAMIN D) 1000 UNITS tablet, Take 1,000 Units by mouth daily. .  citalopram (CELEXA) 20 MG tablet, Take 1 tablet Daily for Mood .  docusate sodium (COLACE) 100 MG capsule, Take 1 capsule (100 mg total) by mouth 3 (three) times daily as needed. .  gabapentin (NEURONTIN) 300 MG capsule, Take 1 capsule 2 x /day Suppertime & Bedtime for Sleep .  glucose blood test strip, Check blood sugar 1 time daily-DX-E11.22. .  hydrOXYzine (ATARAX/VISTARIL) 10 MG tablet, Take 1 tablet 3 x /day for Nerves .  Multiple Vitamins-Minerals (MULTIVITAMIN WITH MINERALS) tablet, Take 1 tablet by mouth daily. [name] Kitchen  PRODIGY LANCETS 28G MISC, Check blood sugar 1 time daily-DX-E11.22. [name] Kitchen  QUEtiapine (SEROQUEL) 50 MG tablet, Take 1/2 to 1 tablet 1 hour before Bedtime .  trazodone (DESYREL) 150 MG tablet, Take 1 to 2  Tablet(s)  1 hour before Bedtime as needed for Sleep  Problem list She has Hypothyroidism; Essential hypertension; DJD; Mixed hyperlipidemia; Vitamin D deficiency; Medication management; CKD stage 3 due to type 2 diabetes mellitus ([place]); Depression, major, recurrent, in partial remission ([place]); BMI 25.0-25.9,adult; Medicare annual wellness visit, subsequent; Gastroesophageal  reflux disease without esophagitis; Prediabetes; and Osteopenia on their problem list.  Review of Systems     Objective:   Physical Exam  Some appreciable weakness left leg, compared to right. Decreased relexes.        Assessment & Plan:  Back pain SI pain- negative straight leg ? SI versus spinal stenosis Follow up with ortho Take 2 gabapentin at night Suggest PT, declines right now Verbal consent obtained, risk and benefits were addressed with patient. A trigger point injection was performed at the site of  maximal tenderness using 1% plain Lidocaine and Dexamethasone. This was well tolerated, and followed by immediate relief of pain. Return precautions discussed with the patient.   Future Appointments  Date Time Provider Department Center  11/15/2019  3:30 PM Judd Gaudier, NP [facility] None  02/14/2020  2:30 PM [name], MD [facility] None  04/24/2020  3:00 PM Elder Negus, NP [facility] None  09/10/2020  3:00 PM [name], MD [facility] None

## 2019-10-13 DIAGNOSIS — M461 Sacroiliitis, not elsewhere classified: Secondary | ICD-10-CM | POA: Diagnosis not present

## 2019-10-27 ENCOUNTER — Other Ambulatory Visit: Payer: Self-pay | Admitting: Internal Medicine

## 2019-10-27 MED ORDER — LOSARTAN POTASSIUM 100 MG PO TABS: ORAL_TABLET | ORAL | 3 refills | Status: AC

## 2019-10-27 MED ORDER — LEVOTHYROXINE SODIUM 100 MCG PO TABS: ORAL_TABLET | ORAL | 3 refills | Status: AC

## 2019-11-15 ENCOUNTER — Ambulatory Visit: Payer: Medicare Other | Admitting: Adult Health

## 2019-11-15 ENCOUNTER — Other Ambulatory Visit: Payer: Self-pay

## 2019-11-15 ENCOUNTER — Ambulatory Visit (INDEPENDENT_AMBULATORY_CARE_PROVIDER_SITE_OTHER): Payer: Medicare Other | Admitting: Internal Medicine

## 2019-11-15 ENCOUNTER — Encounter: Payer: Self-pay | Admitting: Internal Medicine

## 2019-11-15 VITALS — BP 124/80 | HR 64 | Temp 97.7°F | Resp 16 | Ht 62.0 in | Wt 145.2 lb

## 2019-11-15 DIAGNOSIS — I10 Essential (primary) hypertension: Secondary | ICD-10-CM | POA: Diagnosis not present

## 2019-11-15 DIAGNOSIS — G250 Essential tremor: Secondary | ICD-10-CM

## 2019-11-15 DIAGNOSIS — Z79899 Other long term (current) drug therapy: Secondary | ICD-10-CM | POA: Diagnosis not present

## 2019-11-15 DIAGNOSIS — E1122 Type 2 diabetes mellitus with diabetic chronic kidney disease: Secondary | ICD-10-CM

## 2019-11-15 DIAGNOSIS — E785 Hyperlipidemia, unspecified: Secondary | ICD-10-CM | POA: Diagnosis not present

## 2019-11-15 DIAGNOSIS — E1169 Type 2 diabetes mellitus with other specified complication: Secondary | ICD-10-CM | POA: Diagnosis not present

## 2019-11-15 DIAGNOSIS — N1831 Chronic kidney disease, stage 3a: Secondary | ICD-10-CM | POA: Diagnosis not present

## 2019-11-15 DIAGNOSIS — E1121 Type 2 diabetes mellitus with diabetic nephropathy: Secondary | ICD-10-CM

## 2019-11-15 DIAGNOSIS — E559 Vitamin D deficiency, unspecified: Secondary | ICD-10-CM | POA: Diagnosis not present

## 2019-11-15 DIAGNOSIS — E039 Hypothyroidism, unspecified: Secondary | ICD-10-CM

## 2019-11-15 NOTE — Patient Instructions (Signed)

## 2019-11-15 NOTE — Progress Notes (Addendum)
History of Present Illness:      This very nice 86 y.o. WWF  presents for 3 month follow up with HTN, HLD, Pre-Diabetes and Vitamin D Deficiency. Patient is also c/o of an action type tremor worse with writing or activities.      Patient is treated for HTN & BP has been controlled at home. Today's BP is at goal - 124/80. Patient has had no complaints of any cardiac type chest pain, palpitations, dyspnea / orthopnea / PND, dizziness, claudication, or dependent edema.      Hyperlipidemia is controlled with diet & meds. Patient denies myalgias or other med SE's. Last Lipids were at goal:  Lab Results  Component Value Date   CHOL 143 08/08/2019   HDL 54 08/08/2019   LDLCALC 67 08/08/2019   TRIG 136 08/08/2019   CHOLHDL 2.6 08/08/2019        Also, the patient has history of T2_NIDDM and has had no symptoms of reactive hypoglycemia, diabetic polys, paresthesias or visual blurring.  Last A1c was not at goal:  Lab Results  Component Value Date   HGBA1C 6.5 (H) 08/08/2019        Further, the patient also has history of Vitamin D Deficiency and supplements vitamin D without any suspected side-effects. Last vitamin D was at goal:  Lab Results  Component Value Date   VD25OH 37 08/08/2019    Current Outpatient Medications on File Prior to Visit  Medication Sig  . aspirin 81 MG tablet Take 81 mg by mouth daily.  [name] Kitchen atenolol (TENORMIN) 100 MG tablet Take 1 tablet every Morning for BP  . atorvastatin (LIPITOR) 80 MG tablet TAKE 1 TABLET BY MOUTH EVERY DAY  . Biotin 10 MG CAPS Take 1 tablet by mouth daily.   . cholecalciferol (VITAMIN D) 1000 UNITS tablet Take 1,000 Units by mouth daily.  . citalopram (CELEXA) 20 MG tablet Take 1 tablet Daily for Mood  . furosemide (LASIX) 40 MG tablet take 1 tablet by mouth once daily  . gabapentin (NEURONTIN) 300 MG capsule Take 1 capsule 2 x /day Suppertime & Bedtime for Sleep  . glucose blood test strip Check blood sugar 1 time daily-DX-E11.22.    . hydrOXYzine (ATARAX/VISTARIL) 10 MG tablet Take 1 tablet 3 x /day for Nerves  . levothyroxine (SYNTHROID) 100 MCG tablet Take 1 tablet daily on an empty stomach with only water for 30 minutes & no Antacid meds, Calcium or Magnesium for 4 hours & avoid Biotin  . losartan (COZAAR) 100 MG tablet Take 1 tablet Daily for BP  . metFORMIN (GLUCOPHAGE-XR) 500 MG 24 hr tablet Take 2 tablets 2 x /day after meals  for Diabetes  . mexiletine (MEXITIL) 150 MG capsule TAKE 1 CAPSULE BY MOUTH THREE TIMES DAILY  . Multiple Vitamins-Minerals (MULTIVITAMIN WITH MINERALS) tablet Take 1 tablet by mouth daily.  [name] Kitchen PRODIGY LANCETS 28G MISC Check blood sugar 1 time daily-DX-E11.22.  . trazodone (DESYREL) 150 MG tablet Take 1 to 2  Tablet(s)  1 hour before Bedtime as needed for Sleep  . verapamil (VERELAN PM) 120 MG 24 hr capsule TAKE ONE CAPSULE BY MOUTH TWICE DAILY WITH FOOD  . minoxidil (LONITEN) 10 MG tablet Take 1/2 to 1 tablet daily for BP   No current facility-administered medications on file prior to visit.    Allergies  Allergen Reactions  . Codeine Rash    rash  . Vasotec [Enalapril] Other (See Comments)    cough  .  Augmentin [Amoxicillin-Pot Clavulanate] Other (See Comments)    vaginitis  . Hytrin [Terazosin] Other (See Comments)    incontinence   . Oxycodone Rash    rash  . Procardia [Nifedipine] Other (See Comments)    edema    PMHx:   Past Medical History:  Diagnosis Date  . Anemia   . Anxiety   . Arthritis   . Constipation   . Depression   . GERD (gastroesophageal reflux disease)   . History of blood transfusion   . History of toe surgery left  . Hyperlipidemia   . Hypertension   . Hypothyroidism   . Insomnia   . Radiculopathy 02/06/2016  . Thyroid disease   . Type II or unspecified type diabetes mellitus with renal manifestations, not stated as uncontrolled(250.40)    stage II, does not take medication  . Wears glasses    Immunization History  Administered Date(s)  Administered  . Influenza, High Dose Seasonal PF 09/05/2014, 10/02/2015, 07/24/2016, 09/16/2017, 10/04/2018, 08/15/2019  . Influenza-Unspecified 09/10/2013  . Pneumococcal Conjugate-13 07/24/2016  . Pneumococcal-Unspecified 11/26/2012  . Td 11/26/2012  . Tdap 08/24/2014   Past Surgical History:  Procedure Laterality Date  . ABDOMINAL HYSTERECTOMY    . APPENDECTOMY    . BACK SURGERY  02/06/2016  . BREAST BIOPSY     core biopsy 1995  . CARPAL TUNNEL RELEASE Left 07/08/2016   Procedure: CARPAL TUNNEL RELEASE;  Surgeon: Jones Broom, MD;  Location: [facility];  Service: Orthopedics;  Laterality: Left;  Left carpal tunnel release  . CEREBRAL ANEURYSM REPAIR  09/2007   coil ablation  . CHOLECYSTECTOMY  2012   lap choli  . COLONOSCOPY    . GANGLION CYST EXCISION Left 2008   lt ring finger  . HAMMER TOE SURGERY  2008   osteotomy=left foot  . JOINT REPLACEMENT Left 2012   left total knee  . SHOULDER ARTHROSCOPY WITH ROTATOR CUFF REPAIR AND SUBACROMIAL DECOMPRESSION Right 01/29/2015   Procedure: RIGHT SHOULDER ARTHROSCOPY WITH DEBRIDEMENT ROTATOR CUFF TEAR, SUBACROMIAL DECOMPRESSION, DISTAL CLAVICAL RESECTION;  Surgeon: Jones Broom, MD;  Location: [facility];  Service: Orthopedics;  Laterality: Right;  Right shoulder arthroscopy debridement rotatoro cuff tear, subacromial decompression, distal clavical resection  . TONSILLECTOMY    . TOTAL KNEE ARTHROPLASTY Right 2005   right  . TOTAL KNEE REVISION Right 2007   right    FHx:    Reviewed / unchanged  SHx:    Reviewed / unchanged   Systems Review:  Constitutional: Denies fever, chills, wt changes, headaches, insomnia, fatigue, night sweats, change in appetite. Eyes: Denies redness, blurred vision, diplopia, discharge, itchy, watery eyes.  ENT: Denies discharge, congestion, post nasal drip, epistaxis, sore throat, earache, hearing loss, dental pain, tinnitus, vertigo, sinus pain, snoring.  CV:  Denies chest pain, palpitations, irregular heartbeat, syncope, dyspnea, diaphoresis, orthopnea, PND, claudication or edema. Respiratory: denies cough, dyspnea, DOE, pleurisy, hoarseness, laryngitis, wheezing.  Gastrointestinal: Denies dysphagia, odynophagia, heartburn, reflux, water brash, abdominal pain or cramps, nausea, vomiting, bloating, diarrhea, constipation, hematemesis, melena, hematochezia  or hemorrhoids. Genitourinary: Denies dysuria, frequency, urgency, nocturia, hesitancy, discharge, hematuria or flank pain. Musculoskeletal: Denies arthralgias, myalgias, stiffness, jt. swelling, pain, limping or strain/sprain.  Skin: Denies pruritus, rash, hives, warts, acne, eczema or change in skin lesion(s). Neuro: No weakness, tremor, incoordination, spasms, paresthesia or pain. Psychiatric: Denies confusion, memory loss or sensory loss. Endo: Denies change in weight, skin or hair change.  Heme/Lymph: No excessive bleeding, bruising or enlarged lymph nodes.  Physical  Exam  BP 124/80   Pulse 64   Temp 97.7 F (36.5 C)   Resp 16   Ht 5\' 2"  (1.575 m)   Wt 145 lb 3.2 oz (65.9 kg)   BMI 26.56 kg/m   Appears  well nourished, well groomed  and in no distress.  Eyes: PERRLA, EOMs, conjunctiva no swelling or erythema. Sinuses: No frontal/maxillary tenderness ENT/Mouth: EAC's clear, TM's nl w/o erythema, bulging. Nares clear w/o erythema, swelling, exudates. Oropharynx clear without erythema or exudates. Oral hygiene is good. Tongue normal, non obstructing. Hearing intact.  Neck: Supple. Thyroid not palpable. Car 2+/2+ without bruits, nodes or JVD. Chest: Respirations nl with BS clear & equal w/o rales, rhonchi, wheezing or stridor.  Cor: Heart sounds normal w/ regular rate and rhythm without sig. murmurs, gallops, clicks or rubs. Peripheral pulses normal and equal  without edema.  Abdomen: Soft & bowel sounds normal. Non-tender w/o guarding, rebound, hernias, masses or organomegaly.    Lymphatics: Unremarkable.  Musculoskeletal: Full ROM all peripheral extremities, joint stability, 5/5 strength and normal gait.  Skin: Warm, dry without exposed rashes, lesions or ecchymosis apparent.  Neuro: Cranial nerves intact, reflexes equal bilaterally. Sensory-motor testing grossly intact. Tendon reflexes grossly intact. (+) High frequency low amplitude  fine tremor of the hands / fingers with activity Pysch: Alert & oriented x 3.  Insight and judgement nl & appropriate. No ideations.  Assessment and Plan:  1. Essential hypertension  - Continue medication, monitor blood pressure at home.  - Continue DASH diet.  Reminder to go to the ER if any CP,  SOB, nausea, dizziness, severe HA, changes vision/speech.  - CBC with Diff - COMPLETE METABOLIC PANEL WITH GFR - Magnesium - TSH  2. Hyperlipidemia associated with type 2 diabetes mellitus ([place])  - Continue diet/meds, exercise,& lifestyle modifications.  - Continue monitor periodic cholesterol/liver & renal functions   - Lipid Profile - TSH  3. Type 2 diabetes mellitus with stage 3a chronic kidney disease, without long-term current use of insulin ([facility])  - Continue diet, exercise  - Lifestyle modifications.  - Monitor appropriate labs.  - Hemoglobin A1c ([facility]) - Insulin, random  4. Vitamin D deficiency  - Continue supplementation.  5. Hereditary tremor  - Rx Propranolol 120 mg ER and  Diazepam 5 mg x 1/2 - 1 tab tid    6. Hypothyroidism  - TSH  7. Medication management  - CBC with Diff - COMPLETE METABOLIC PANEL WITH GFR - Magnesium - Lipid Profile - TSH - Hemoglobin A1c ([facility]) - Insulin, random         Discussed  regular exercise, BP monitoring, weight control to achieve/maintain BMI less than 25 and discussed med and SE's. Recommended labs to assess and monitor clinical status with further disposition pending results of labs.  I discussed the assessment and treatment plan with the patient. The  patient was provided an opportunity to ask questions and all were answered. The patient agreed with the plan and demonstrated an understanding of the instructions.  I provided over 30 minutes of exam, counseling, chart review and  complex critical decision making.   Kirtland Bouchard, MD

## 2019-11-16 LAB — COMPLETE METABOLIC PANEL WITH GFR
AG Ratio: 1.9 (calc) (ref 1.0–2.5)
ALT: 9 U/L (ref 6–29)
AST: 19 U/L (ref 10–35)
Albumin: 4.3 g/dL (ref 3.6–5.1)
Alkaline phosphatase (APISO): 57 U/L (ref 37–153)
BUN/Creatinine Ratio: 21 (calc) (ref 6–22)
BUN: 21 mg/dL (ref 7–25)
CO2: 32 mmol/L (ref 20–32)
Calcium: 9.7 mg/dL (ref 8.6–10.4)
Chloride: 102 mmol/L (ref 98–110)
Creat: 1 mg/dL — ABNORMAL HIGH (ref 0.60–0.88)
GFR, Est African American: 60 mL/min/{1.73_m2} (ref >=60)
GFR, Est Non African American: 52 mL/min/{1.73_m2} — ABNORMAL LOW (ref >=60)
Globulin: 2.3 g/dL (calc) (ref 1.9–3.7)
Glucose, Bld: 97 mg/dL (ref 65–99)
Potassium: 4.2 mmol/L (ref 3.5–5.3)
Sodium: 142 mmol/L (ref 135–146)
Total Bilirubin: 0.4 mg/dL (ref 0.2–1.2)
Total Protein: 6.6 g/dL (ref 6.1–8.1)

## 2019-11-16 LAB — CBC WITH DIFFERENTIAL/PLATELET
Absolute Monocytes: 695 cells/uL (ref 200–950)
Basophils Absolute: 106 cells/uL (ref 0–200)
Basophils Relative: 1.2 %
Eosinophils Absolute: 378 cells/uL (ref 15–500)
Eosinophils Relative: 4.3 %
HCT: 36.1 % (ref 35.0–45.0)
Hemoglobin: 11.9 g/dL (ref 11.7–15.5)
Lymphs Abs: 2561 cells/uL (ref 850–3900)
MCH: 30.6 pg (ref 27.0–33.0)
MCHC: 33 g/dL (ref 32.0–36.0)
MCV: 92.8 fL (ref 80.0–100.0)
MPV: 10.5 fL (ref 7.5–12.5)
Monocytes Relative: 7.9 %
Neutro Abs: 5060 cells/uL (ref 1500–7800)
Neutrophils Relative %: 57.5 %
Platelets: 328 10*3/uL (ref 140–400)
RBC: 3.89 10*6/uL (ref 3.80–5.10)
RDW: 11.8 % (ref 11.0–15.0)
Total Lymphocyte: 29.1 %
WBC: 8.8 10*3/uL (ref 3.8–10.8)

## 2019-11-16 LAB — LIPID PANEL
Cholesterol: 138 mg/dL (ref <200)
HDL: 49 mg/dL — ABNORMAL LOW (ref >=50)
LDL Cholesterol (Calc): 70 mg/dL (calc)
Non-HDL Cholesterol (Calc): 89 mg/dL (calc) (ref <130)
Total CHOL/HDL Ratio: 2.8 (calc) (ref <5.0)
Triglycerides: 108 mg/dL (ref <150)

## 2019-11-16 LAB — HEMOGLOBIN A1C
Hgb A1c MFr Bld: 6.7 % of total Hgb — ABNORMAL HIGH (ref <5.7)
Mean Plasma Glucose: 146 (calc)
eAG (mmol/L): 8.1 (calc)

## 2019-11-16 LAB — INSULIN, RANDOM: Insulin: 2.4 u[IU]/mL

## 2019-11-16 LAB — TSH: TSH: 0.37 mIU/L — ABNORMAL LOW (ref 0.40–4.50)

## 2019-11-16 LAB — MAGNESIUM: Magnesium: 1.9 mg/dL (ref 1.5–2.5)

## 2019-11-16 MED ORDER — DIAZEPAM 5 MG PO TABS: ORAL_TABLET | ORAL | 1 refills | Status: AC

## 2019-11-16 MED ORDER — PROPRANOLOL HCL ER 120 MG PO CP24: 120.0000 mg | ORAL_CAPSULE | Freq: Every day | ORAL | 3 refills | Status: AC

## 2019-11-16 NOTE — Addendum Note (Signed)
Addended by: [name] on: 11/16/2019 06:39 PM   Modules accepted: Orders

## 2019-11-27 ENCOUNTER — Other Ambulatory Visit: Payer: Self-pay | Admitting: Internal Medicine

## 2019-11-27 MED ORDER — ATORVASTATIN CALCIUM 80 MG PO TABS: ORAL_TABLET | ORAL | 3 refills | Status: AC

## 2019-11-27 MED ORDER — VERAPAMIL HCL ER 120 MG PO CP24: ORAL_CAPSULE | ORAL | 3 refills | Status: AC

## 2019-11-28 ENCOUNTER — Encounter: Payer: Self-pay | Admitting: Adult Health

## 2019-11-28 ENCOUNTER — Ambulatory Visit (INDEPENDENT_AMBULATORY_CARE_PROVIDER_SITE_OTHER): Payer: Medicare Other | Admitting: Adult Health

## 2019-11-28 ENCOUNTER — Other Ambulatory Visit: Payer: Self-pay

## 2019-11-28 VITALS — BP 166/86 | HR 85 | Temp 97.7°F | Wt 144.0 lb

## 2019-11-28 DIAGNOSIS — Z79899 Other long term (current) drug therapy: Secondary | ICD-10-CM | POA: Diagnosis not present

## 2019-11-28 DIAGNOSIS — R079 Chest pain, unspecified: Secondary | ICD-10-CM

## 2019-11-28 DIAGNOSIS — I10 Essential (primary) hypertension: Secondary | ICD-10-CM | POA: Diagnosis not present

## 2019-11-28 NOTE — Patient Instructions (Addendum)
Please get a chest xray at [address] [facility]    Can try adding miralax 1 packet/scoop daily with 1-2 tall glasses of water  Go to the ER if any chest pain, shortness of breath, nausea, dizziness, severe HA, changes vision/speech    Nonspecific Chest Pain, Adult Chest pain can be caused by many different conditions. It can be caused by a condition that is life-threatening and requires treatment right away. It can also be caused by something that is not life-threatening. If you have chest pain, it can be hard to know the difference, so it is important to get help right away to make sure that you do not have a serious condition. Some life-threatening causes of chest pain include:  Heart attack.  A tear in the body's main blood vessel (aortic dissection).  Inflammation around your heart (pericarditis).  A problem in the lungs, such as a blood clot (pulmonary embolism) or a collapsed lung (pneumothorax). Some non life-threatening causes of chest pain include:  Heartburn.  Anxiety or stress.  Damage to the bones, muscles, and cartilage that make up your chest wall.  Pneumonia or bronchitis.  Shingles infection (varicella-zoster virus). Chest pain can feel like:  Pain or discomfort on the surface of your chest or deep in your chest.  Crushing, pressure, aching, or squeezing pain.  Burning or tingling.  Dull or sharp pain that is worse when you move, cough, or take a deep breath.  Pain or discomfort that is also felt in your back, neck, jaw, shoulder, or arm, or pain that spreads to any of these areas. Your chest pain may come and go. It may also be constant. Your health care provider will do lab tests and other studies to find the cause of your pain. Treatment will depend on the cause of your chest pain. Follow these instructions at home: Medicines  Take over-the-counter and prescription medicines only as told by your health care provider.  If you were  prescribed an antibiotic, take it as told by your health care provider. Do not stop taking the antibiotic even if you start to feel better. Lifestyle   Rest as directed by your health care provider.  Do not use any products that contain nicotine or tobacco, such as cigarettes and e-cigarettes. If you need help quitting, ask your health care provider.  Do not drink alcohol.  Make healthy lifestyle choices as recommended. These may include: ? Getting regular exercise. Ask your health care provider to suggest some activities that are safe for you. ? Eating a heart-healthy diet. This includes plenty of fresh fruits and vegetables, whole grains, low-fat (lean) protein, and low-fat dairy products. A dietitian can help you find healthy eating options. ? Maintaining a healthy weight. ? Managing any other health conditions you have, such as high blood pressure (hypertension) or diabetes. ? Reducing stress, such as with yoga or relaxation techniques. General instructions  Pay attention to any changes in your symptoms. Tell your health care provider about them or any new symptoms.  Avoid any activities that cause chest pain.  Keep all follow-up visits as told by your health care provider. This is important. This includes visits for any further testing if your chest pain does not go away. Contact a health care provider if:  Your chest pain does not go away.  You feel depressed.  You have a fever. Get help right away if:  Your chest pain gets worse.  You have a cough that gets worse, or  you cough up blood.  You have severe pain in your abdomen.  You faint.  You have sudden, unexplained chest discomfort.  You have sudden, unexplained discomfort in your arms, back, neck, or jaw.  You have shortness of breath at any time.  You suddenly start to sweat, or your skin gets clammy.  You feel nausea or you vomit.  You suddenly feel lightheaded or dizzy.  You have severe weakness, or  unexplained weakness or fatigue.  Your heart begins to beat quickly, or it feels like it is skipping beats. These symptoms may represent a serious problem that is an emergency. Do not wait to see if the symptoms will go away. Get medical help right away. Call your local emergency services (911 in the [place] Do not drive yourself to the hospital. Summary  Chest pain can be caused by a condition that is serious and requires urgent treatment. It may also be caused by something that is not life-threatening.  If you have chest pain, it is very important to see your health care provider. Your health care provider may do lab tests and other studies to find the cause of your pain.  Follow your health care provider's instructions on taking medicines, making lifestyle changes, and getting emergency treatment if symptoms become worse.  Keep all follow-up visits as told by your health care provider. This includes visits for any further testing if your chest pain does not go away. This information is not intended to replace advice given to you by your health care provider. Make sure you discuss any questions you have with your health care provider. Document Revised: 04/29/2018 Document Reviewed: 04/29/2018 Elsevier Patient Education  2020 [employer].

## 2019-11-28 NOTE — Progress Notes (Signed)
Assessment and Plan:  [name] was seen today for abdominal pain.  Diagnoses and all orders for this visit:  Chest pain, unspecified type Episode 3 days ago that spontaneously resolved; unclear etiology, ? Minor EKG changes ST depression vs RSR' V2 early IRBBB may explain per review with Dr. Melford Aase With age, hx of diabetes, htn, hyperlipidemia, no recent cardiac evaluation reasonable to refer for cardiology input for possible perfusion study  Continue with diabetes treatment, bASA, high intensity statin Will check basic upper abdominal labs, obtain CXR as hasn't had recent  No GERD hx, Alternately r/t constipation or gas  Go to the ER if any further chest pain, shortness of breath, nausea, dizziness, new persistent fatigue, edema.  -     EKG 12-Lead -     DG Chest 2 View; Future -     CBC with Differential/Platelet -     COMPLETE METABOLIC PANEL WITH GFR  Labile hypertension Hx of labile htn; per patient controlled at home Defer med adjustment; requested she check BID and KEEP A LOG TO BRING at follow up OV 12/01/2019 Monitor blood pressure at home; call if consistently over 140/90 Continue DASH diet.   Reminder to go to the ER if any CP, SOB, nausea, dizziness, severe HA, changes vision/speech, left arm numbness and tingling and jaw pain.   Further disposition pending results of labs. Discussed med's effects and SE's.   Over 30 minutes of exam, counseling, chart review, and critical decision making was performed.   Future Appointments  Date Time Provider [place]  12/01/2019 11:30 AM Unk Pinto, MD [facility] None  02/14/2020  2:30 PM Unk Pinto, MD [facility] None  04/24/2020  3:00 PM Garnet Sierras, NP [facility] None  09/10/2020  3:00 PM Unk Pinto, MD [facility] None    ------------------------------------------------------------------------------------------------------------------   HPI BP (!) 170/88   Pulse 85   Temp 97.7 F (36.5 C)   Wt 144  lb (65.3 kg)   SpO2 95%   BMI 26.34 kg/m   86 y.o.female with hx of labile htn, hyperlipidemia, T2DM well controlled by medications presents for evaluation due to episodes of chest pain over the weekend.   She reports she was awakened Friday AM around 6 AM with severe epigastric/lower midline chest pain, describes as "heaviness" and "heavy beating" sensation, 10/10, localized without radiation. She reports she lay in bed trying to relax and spontaneously resolved after a few minutes without intervention. She denies dyspnea, sense of impending doom, nausea, dizziness, palpitations, fatigue, edema, coughing at that time or since. Denies recurrence, has felt very well, only here due to daughter's urging.   Last seen by Dr. Lilian Kapur in 01/2015 for preop clearance and due to systolic murmer; ECHO 03/19/2584 showed - Normal LV size with mild focal basal septal hypertrophy, EF 55-60%. Normal RV size and systolic function. No significant valvular abnormalities.   Denies hx of acid reflux, recent abdominal pain, distention, gassiness. She does endorse some constipation, hadn't had good BM in 2-3 days prior to her episode, has since had several good BMs.   No known family hx of CAD.   She reports BPs have been "good" at home, cannot recall specific numbers, today their BP is BP: (!) 166/86 by manual recheck by provider. On review she does commonly present with labile values though typically controlled in 120s-130s/60-80s.   She does not workout. She denies chest pain, shortness of breath, dizziness, edema, fatigue at this time.    She is on cholesterol medication (atorvastatin) and  denies myalgias. Her cholesterol is at goal. The cholesterol last visit was:   Lab Results  Component Value Date   CHOL 138 11/15/2019   HDL 49 (L) 11/15/2019   LDLCALC 70 11/15/2019   TRIG 108 11/15/2019   CHOLHDL 2.8 11/15/2019    She F7PZ well controlled by metformin. Last A1C in the office was:  Lab Results   Component Value Date   HGBA1C 6.7 (H) 11/15/2019     Past Medical History:  Diagnosis Date  . Anemia   . Anxiety   . Arthritis   . Constipation   . Depression   . GERD (gastroesophageal reflux disease)   . History of blood transfusion   . History of toe surgery left  . Hyperlipidemia   . Hypertension   . Hypothyroidism   . Insomnia   . Radiculopathy 02/06/2016  . Thyroid disease   . Type II or unspecified type diabetes mellitus with renal manifestations, not stated as uncontrolled(250.40)    stage II, does not take medication  . Wears glasses      Allergies  Allergen Reactions  . Codeine Rash    rash  . Vasotec [Enalapril] Other (See Comments)    cough  . Augmentin [Amoxicillin-Pot Clavulanate] Other (See Comments)    vaginitis  . Hytrin [Terazosin] Other (See Comments)    incontinence   . Oxycodone Rash    rash  . Procardia [Nifedipine] Other (See Comments)    edema    Current Outpatient Medications on File Prior to Visit  Medication Sig  . aspirin 81 MG tablet Take 81 mg by mouth daily.  [name] Kitchen atenolol (TENORMIN) 100 MG tablet Take 1 tablet every Morning for BP  . atorvastatin (LIPITOR) 80 MG tablet Take 1 tablet Daily for Cholesterol  . Biotin 10 MG CAPS Take 1 tablet by mouth daily.   . cholecalciferol (VITAMIN D) 1000 UNITS tablet Take 1,000 Units by mouth daily.  . citalopram (CELEXA) 20 MG tablet Take 1 tablet Daily for Mood  . diazepam (VALIUM) 5 MG tablet Take 1/2 to 1 tablet 3 x /day for Tremor  . furosemide (LASIX) 40 MG tablet take 1 tablet by mouth once daily  . gabapentin (NEURONTIN) 300 MG capsule Take 1 capsule 2 x /day Suppertime & Bedtime for Sleep  . glucose blood test strip Check blood sugar 1 time daily-DX-E11.22.  . hydrOXYzine (ATARAX/VISTARIL) 10 MG tablet Take 1 tablet 3 x /day for Nerves  . levothyroxine (SYNTHROID) 100 MCG tablet Take 1 tablet daily on an empty stomach with only water for 30 minutes & no Antacid meds, Calcium or  Magnesium for 4 hours & avoid Biotin  . losartan (COZAAR) 100 MG tablet Take 1 tablet Daily for BP  . metFORMIN (GLUCOPHAGE-XR) 500 MG 24 hr tablet Take 2 tablets 2 x /day after meals  for Diabetes  . mexiletine (MEXITIL) 150 MG capsule TAKE 1 CAPSULE BY MOUTH THREE TIMES DAILY  . Multiple Vitamins-Minerals (MULTIVITAMIN WITH MINERALS) tablet Take 1 tablet by mouth daily.  [name] Kitchen PRODIGY LANCETS 28G MISC Check blood sugar 1 time daily-DX-E11.22.  . propranolol ER (INDERAL LA) 120 MG 24 hr capsule Take 1 capsule (120 mg total) by mouth daily. Take 1 capsule every morning for BP & Tremor  . trazodone (DESYREL) 150 MG tablet Take 1 to 2  Tablet(s)  1 hour before Bedtime as needed for Sleep  . verapamil (VERELAN PM) 120 MG 24 hr capsule Take 1 capsule 2 x /day with Food for BP  .  minoxidil (LONITEN) 10 MG tablet Take 1/2 to 1 tablet daily for BP   No current facility-administered medications on file prior to visit.    ROS: all negative except above.   Physical Exam:  BP (!) 170/88   Pulse 85   Temp 97.7 F (36.5 C)   Wt 144 lb (65.3 kg)   SpO2 95%   BMI 26.34 kg/m   General Appearance: Well nourished, in no apparent distress. Eyes: PERRLA, EOMs, conjunctiva no swelling or erythema ENT/Mouth: Ext aud canals clear, TMs without erythema, bulging. Mask in place; oral exam deferred. Hearing normal.  Neck: Supple, thyroid normal.  Respiratory: Respiratory effort normal, BS equal bilaterally without rales, rhonchi, wheezing or stridor.  Cardio: RRR with no MRGs. Brisk peripheral pulses without edema.  Abdomen: Soft, + BS.  Non tender, no guarding, rebound, hernias, masses. Lymphatics: Non tender without lymphadenopathy.  Musculoskeletal: Full ROM, 5/5 strength, normal gait.  Skin: Warm, dry without rashes, lesions, ecchymosis.  Neuro: Cranial nerves intact. Normal muscle tone, no cerebellar symptoms. Sensation intact.  Psych: Awake and oriented X 3, normal affect, Insight and Judgment  appropriate.     Dan Maker, NP 11:02 AM [facility]

## 2019-11-29 ENCOUNTER — Ambulatory Visit
Admission: RE | Admit: 2019-11-29 | Discharge: 2019-11-29 | Disposition: A | Discharge status: Home / Self Care | Payer: Medicare Other | Source: Ambulatory Visit | Attending: Adult Health | Admitting: Adult Health

## 2019-11-29 DIAGNOSIS — R079 Chest pain, unspecified: Secondary | ICD-10-CM

## 2019-11-29 LAB — COMPLETE METABOLIC PANEL WITH GFR
AG Ratio: 1.8 (calc) (ref 1.0–2.5)
ALT: 10 U/L (ref 6–29)
AST: 18 U/L (ref 10–35)
Albumin: 4.4 g/dL (ref 3.6–5.1)
Alkaline phosphatase (APISO): 65 U/L (ref 37–153)
BUN: 20 mg/dL (ref 7–25)
CO2: 33 mmol/L — ABNORMAL HIGH (ref 20–32)
Calcium: 9.8 mg/dL (ref 8.6–10.4)
Chloride: 101 mmol/L (ref 98–110)
Creat: 0.79 mg/dL (ref 0.60–0.88)
GFR, Est African American: 79 mL/min/{1.73_m2} (ref >=60)
GFR, Est Non African American: 68 mL/min/{1.73_m2} (ref >=60)
Globulin: 2.5 g/dL (calc) (ref 1.9–3.7)
Glucose, Bld: 137 mg/dL — ABNORMAL HIGH (ref 65–99)
Potassium: 4.2 mmol/L (ref 3.5–5.3)
Sodium: 141 mmol/L (ref 135–146)
Total Bilirubin: 0.5 mg/dL (ref 0.2–1.2)
Total Protein: 6.9 g/dL (ref 6.1–8.1)

## 2019-11-29 LAB — CBC WITH DIFFERENTIAL/PLATELET
Absolute Monocytes: 560 cells/uL (ref 200–950)
Basophils Absolute: 104 cells/uL (ref 0–200)
Basophils Relative: 1.3 %
Eosinophils Absolute: 312 cells/uL (ref 15–500)
Eosinophils Relative: 3.9 %
HCT: 37.2 % (ref 35.0–45.0)
Hemoglobin: 12.3 g/dL (ref 11.7–15.5)
Lymphs Abs: 2120 cells/uL (ref 850–3900)
MCH: 30.8 pg (ref 27.0–33.0)
MCHC: 33.1 g/dL (ref 32.0–36.0)
MCV: 93 fL (ref 80.0–100.0)
MPV: 10.5 fL (ref 7.5–12.5)
Monocytes Relative: 7 %
Neutro Abs: 4904 cells/uL (ref 1500–7800)
Neutrophils Relative %: 61.3 %
Platelets: 329 10*3/uL (ref 140–400)
RBC: 4 10*6/uL (ref 3.80–5.10)
RDW: 11.7 % (ref 11.0–15.0)
Total Lymphocyte: 26.5 %
WBC: 8 10*3/uL (ref 3.8–10.8)

## 2019-11-29 LAB — MAGNESIUM: Magnesium: 1.8 mg/dL (ref 1.5–2.5)

## 2019-11-30 ENCOUNTER — Encounter: Payer: Self-pay | Admitting: Adult Health

## 2019-11-30 DIAGNOSIS — I700 Atherosclerosis of aorta: Secondary | ICD-10-CM | POA: Insufficient documentation

## 2019-12-01 ENCOUNTER — Ambulatory Visit (INDEPENDENT_AMBULATORY_CARE_PROVIDER_SITE_OTHER): Payer: Medicare Other | Admitting: Internal Medicine

## 2019-12-01 ENCOUNTER — Other Ambulatory Visit: Payer: Self-pay

## 2019-12-01 VITALS — BP 126/84 | HR 68 | Temp 97.0°F | Resp 16 | Ht 62.0 in | Wt 145.0 lb

## 2019-12-01 DIAGNOSIS — G250 Essential tremor: Secondary | ICD-10-CM | POA: Diagnosis not present

## 2019-12-01 DIAGNOSIS — I10 Essential (primary) hypertension: Secondary | ICD-10-CM | POA: Diagnosis not present

## 2019-12-01 NOTE — Patient Instructions (Signed)
Tremor A tremor is trembling or shaking that you cannot control. Most tremors affect the hands or arms. Tremors can also affect the head, vocal cords, face, and other parts of the body. There are many types of tremors. Common types include:  Essential tremor. These usually occur in people older than 40. It may run in families and can happen in otherwise healthy people.  Resting tremor. These occur when the muscles are at rest, such as when your hands are resting in your lap. People with Parkinson's disease often have resting tremors.  Postural tremor. These occur when you try to hold a pose, such as keeping your hands outstretched.  Kinetic tremor. These occur during purposeful movement, such as trying to touch a finger to your nose.  Task-specific tremor. These may occur when you perform certain tasks such as writing, speaking, or standing.  Psychogenic tremor. These dramatically lessen or disappear when you are distracted. They can happen in people of all ages. Some types of tremors have no known cause. Tremors can also be a symptom of nervous system problems (neurological disorders) that may occur with aging. Some tremors go away with treatment, while others do not. Follow these instructions at home: Lifestyle      Limit alcohol intake to no more than 1 drink a day for nonpregnant women and 2 drinks a day for men. One drink equals 12 oz of beer, 5 oz of wine, or 1 oz of hard liquor.  Do not use any products that contain nicotine or tobacco, such as cigarettes and e-cigarettes. If you need help quitting, ask your health care provider.  Avoid extreme heat and extreme cold.  Limit your caffeine intake, as told by your health care provider.  Try to get 8 hours of sleep each night.  Find ways to manage your stress, such as meditation or yoga. General instructions  Take over-the-counter and prescription medicines only as told by your health care provider.  Keep all follow-up visits  as told by your health care provider. This is important. Contact a health care provider if you:  Develop a tremor after starting a new medicine.  Have a tremor along with other symptoms such as: ? Numbness. ? Tingling. ? Pain. ? Weakness.  Notice that your tremor gets worse.  Notice that your tremor interferes with your day-to-day life. Summary  A tremor is trembling or shaking that you cannot control.  Most tremors affect the hands or arms.  Some types of tremors have no known cause. Others may be a symptom of nervous system problems (neurological disorders).  Make sure you discuss any tremors you have with your health care provider. ============================================   Essential Tremor A tremor is trembling or shaking that a person cannot control. Most tremors affect the hands or arms. Tremors can also affect the head, vocal cords, legs, and other parts of the body. Essential tremor is a tremor without a known cause. Usually, it occurs while a person is trying to perform an action. It tends to get worse gradually as a person ages. What are the causes? The cause of this condition is not known. What increases the risk? You are more likely to develop this condition if:  You have a family member with essential tremor.  You are age 84 or older.  You take certain medicines. What are the signs or symptoms? The main sign of a tremor is a rhythmic shaking of certain parts of your body that is uncontrolled and unintentional. You may:  Have difficulty eating with a spoon or fork.  Have difficulty writing.  Nod your head up and down or side to side.  Have a quivering voice. The shaking may:  Get worse over time.  Come and go.  Be more noticeable on one side of your body.  Get worse due to stress, fatigue, caffeine, and extreme heat or cold. How is this diagnosed? This condition may be diagnosed based on:  Your symptoms and medical history.  A physical  exam. There is no single test to diagnose an essential tremor. However, your health care provider may order tests to rule out other causes of your condition. These may include:  Blood and urine tests.  Imaging studies of your brain, such as CT scan and MRI.  A test that measures involuntary muscle movement (electromyogram). How is this treated? Treatment for essential tremor depends on the severity of the condition.  Some tremors may go away without treatment.  Mild tremors may not need treatment if they do not affect your day-to-day life.  Severe tremors may need to be treated using one or more of the following options: ? Medicines. ? Lifestyle changes. ? Occupational or physical therapy. Follow these instructions at home: Lifestyle   Do not use any products that contain nicotine or tobacco, such as cigarettes and e-cigarettes. If you need help quitting, ask your health care provider.  Limit your caffeine intake as told by your health care provider.  Try to get 8 hours of sleep each night.  Find ways to manage your stress that fits your lifestyle and personality. Consider trying meditation or yoga.  Try to anticipate stressful situations and allow extra time to manage them.  If you are struggling emotionally with the effects of your tremor, consider working with a mental health provider. General instructions  Take over-the-counter and prescription medicines only as told by your health care provider.  Avoid extreme heat and extreme cold.  Keep all follow-up visits as told by your health care provider. This is important. Visits may include physical therapy visits. Contact a health care provider if:  You experience any changes in the location or intensity of your tremors.  You start having a tremor after starting a new medicine.  You have tremor with other symptoms, such as: ? Numbness. ? Tingling. ? Pain. ? Weakness.  Your tremor gets worse.  Your tremor  interferes with your daily life.  You feel down, blue, or sad for at least 2 weeks in a row.  Worrying about your tremor and what other people think about you interferes with your everyday life functions, including relationships, work, or school. Summary  Essential tremor is a tremor without a known cause. Usually, it occurs when you are trying to perform an action.  The cause of this condition is not known.  The main sign of a tremor is a rhythmic shaking of certain parts of your body that is uncontrolled and unintentional.  Treatment for essential tremor depends on the severity of the condition. This information is not intended to replace advice given to you by your health care provider. Make sure you discuss any questions you have with your health care provider. Document Revised: 11/06/2017 Document Reviewed: 11/06/2017 Elsevier Patient Education  2020 [employer].

## 2019-12-01 NOTE — Progress Notes (Signed)
History of Present Illness:     This very nice 86 yo WWF  With labile HTN and T2-DM was started 2 weeks ago on Propranolol 120 mg ER & Diazepam 5 mg x 1/2 -1 tid for hereditary tremor. Patient reports tremor much improved.      Medications  .  levothyroxine (SYNTHROID) 100 MCG tablet, Take 1 tablet daily on an empty stomach with only water for 30 minutes & no Antacid meds, Calcium or Magnesium for 4 hours & avoid Biotin .  metFORMIN (GLUCOPHAGE-XR) 500 MG 24 hr tablet, Take 2 tablets 2 x /day after meals  for Diabetes .  atorvastatin (LIPITOR) 80 MG tablet, Take 1 tablet Daily for Cholesterol .  furosemide (LASIX) 40 MG tablet, take 1 tablet by mouth once daily .  losartan (COZAAR) 100 MG tablet, Take 1 tablet Daily for BP .  mexiletine (MEXITIL) 150 MG capsule, TAKE 1 CAPSULE BY MOUTH THREE TIMES DAILY .  propranolol ER (INDERAL LA) 120 MG 24 hr capsule, Take 1 capsule (120 mg total) by mouth daily. Take 1 capsule every morning for BP & Tremor .  verapamil (VERELAN PM) 120 MG 24 hr capsule, Take 1 capsule 2 x /day with Food for BP .  minoxidil (LONITEN) 10 MG tablet, Take 1/2 to 1 tablet daily for BP .  aspirin 81 MG tablet, Take 81 mg by mouth daily.  .  Biotin 10 MG CAPS, Take 1 tablet by mouth daily.  .  cholecalciferol (VITAMIN D) 1000 UNITS tablet, Take 1,000 Units by mouth daily. .  citalopram (CELEXA) 20 MG tablet, Take 1 tablet Daily for Mood .  diazepam (VALIUM) 5 MG tablet, Take 1/2 to 1 tablet 3 x /day for Tremor .  gabapentin (NEURONTIN) 300 MG capsule, Take 1 capsule 2 x /day Suppertime & Bedtime for Sleep .  glucose blood test strip, Check blood sugar 1 time daily-DX-E11.22. .  hydrOXYzine (ATARAX/VISTARIL) 10 MG tablet, Take 1 tablet 3 x /day for Nerves .  Multiple Vitamins-Minerals (MULTIVITAMIN WITH MINERALS) tablet, Take 1 tablet by mouth daily. [name] Kitchen  PRODIGY LANCETS 28G MISC, Check blood sugar 1 time daily-DX-E11.22. .  trazodone (DESYREL) 150 MG tablet, Take 1 to 2   Tablet(s)  1 hour before Bedtime as needed for Sleep  Problem list She has Hypothyroidism; Essential hypertension; DJD; Mixed hyperlipidemia; Vitamin D deficiency; Medication management; CKD stage 3 due to type 2 diabetes mellitus ([place]); Depression, major, recurrent, in partial remission ([place]); BMI 25.0-25.9,adult; Medicare annual wellness visit, subsequent; Gastroesophageal reflux disease without esophagitis; Prediabetes; Osteopenia; and Aortic atherosclerosis ([place]) on their problem list.   Observations/Objective:  BP 126/84   Pulse 68   Temp (!) 97 F (36.1 C)   Resp 16   Ht 5\' 2"  (1.575 m)   Wt 145 lb (65.8 kg)   BMI 26.52 kg/m   Recheck Postural BP's   And Stand BP 151/84Pul Sit BP 158/97     P 72  HEENT - WNL. Neck - supple.  Chest - Clear equal BS. Cor - Nl HS. RRR w/o sig MGR. PP 1(+). No edema. MS- FROM w/o deformities.  Gait Nl. No cog wheelimg. Tone seems normal  Neuro -  Nl w/o focal abnormalities. Today  Has an intermittent  ? pill rolling tremor of the Rt thumb.   Assessment and Plan:  1. Essential hypertension  2. Tremor, essential ( & ?early Parkinson's)   Follow Up Instructions:  - Advised continue meds same & continue to monitor  BP       I discussed the assessment and treatment plan with the patient. The patient was provided an opportunity to ask questions and all were answered. The patient agreed with the plan and demonstrated an understanding of the instructions.  The patient was advised to call back or seek an in-person evaluation if the symptoms worsen or if the condition fails to improve as anticipated.   Marinus Maw, MD

## 2019-12-04 ENCOUNTER — Encounter: Payer: Self-pay | Admitting: Internal Medicine

## 2019-12-04 DIAGNOSIS — G250 Essential tremor: Secondary | ICD-10-CM | POA: Insufficient documentation

## 2019-12-05 ENCOUNTER — Encounter: Payer: Self-pay | Admitting: General Practice

## 2019-12-06 ENCOUNTER — Telehealth: Payer: Self-pay | Admitting: Adult Health

## 2019-12-06 NOTE — Telephone Encounter (Signed)
Per Judd Gaudier called patient to follow up on cardiology referral outcome. Patient states she is feeling better, no chest pains. States she is taking Propranolol ER daily. Patient states not taking Valiumin  X 2 days; c/o  "makes her feel drunk & can't stand up". Please advise your recommendation for this complaint.

## 2019-12-07 NOTE — Telephone Encounter (Signed)
Patient notified. States that today, since stopping the Valium is the best she has felt.

## 2019-12-07 NOTE — Telephone Encounter (Signed)
Patient states that she would rather have the tremors than to have that drunk feeling.  Patient also states and is concerned about her balance, which is something new. She is not feeling steady at all on her feet and has to go next week to renew her drivers license. She does have a cane that she can use right now. Please advise.

## 2019-12-26 ENCOUNTER — Other Ambulatory Visit: Payer: Self-pay | Admitting: Internal Medicine

## 2019-12-26 MED ORDER — DEXAMETHASONE 4 MG PO TABS: ORAL_TABLET | ORAL | 0 refills | Status: DC

## 2019-12-26 MED ORDER — IPRATROPIUM BROMIDE 0.06 % NA SOLN: NASAL | 0 refills | Status: AC

## 2019-12-31 NOTE — Progress Notes (Signed)
History of Present Illness:     Patient is a nice 86 yo WWF who was recently started on Propranolol & Diazepam for Familial tremor and had an initial good response, but subsequently stopped the Diazepam due to sedation and then again restarted it at 1/2 tablet at night.  She is satisfied with control of her tremor. On 12/26/2019,  she was prescribed a short tapering course of Decadron & Atrovent nasal for "watery" rhinitis which has resolved.  Also, today she's c/o occas "fluttering" palpitations w/o any other associated symptoms.   Medications  .  levothyroxine  100 MCG tablet, Take 1 tablet daily  .  metFORMIN -XR 500 MG 24 hr tablet, Take 2 tablets 2 x /day after meals  for Diabetes .  atorvastatin (LIPITOR) 80 MG tablet, Take 1 tablet Daily for Cholesterol .  furosemide (LASIX) 40 MG tablet, take 1 tablet by mouth once daily .  losartan (COZAAR) 100 MG tablet, Take 1 tablet Daily for BP .  mexiletine (MEXITIL) 150 MG capsule, TAKE 1 CAPSULE BY MOUTH THREE TIMES DAILY .  minoxidil (LONITEN) 10 MG tablet, Take 1/2 to 1 tablet daily for BP .  propranolol ER  120 MG 24 hr , Take 1 capsule  daily .  verapamil (VERELAN PM) 120 MG 24 hr capsule, Take 1 capsule 2 x /day with Food for BP .  ipratropium (ATROVENT) 0.06 % nasal spray, Use 1 to 2 sprays each Nostril 2 to 3 x /day .  aspirin 81 MG tablet, Take 81 mg by mouth daily. .  Biotin 10 MG CAPS, Take 1 tablet by mouth daily.  .  cholecalciferol (VITAMIN D) 1000 UNITS tablet, Take 1,000 Units by mouth daily. .  citalopram (CELEXA) 20 MG tablet, Take 1 tablet Daily for Mood .  diazepam (VALIUM) 5 MG tablet, Take 1/2 to 1 tablet 3 x /day for Tremor .  gabapentin (NEURONTIN) 300 MG capsule, Take 1 capsule 2 x /day Suppertime & Bedtime for Sleep .  hydrOXYzine (ATARAX/VISTARIL) 10 MG tablet, Take 1 tablet 3 x /day for Nerves .  Multiple Vitamins-Minerals (MULTIVITAMIN WITH MINERALS) tablet, Take 1 tablet by mouth daily.   .  trazodone  (DESYREL) 150 MG tablet, Take 1 to 2  Tablet(s)  1 hour before Bedtime as needed for Sleep  Problem list She has Hypothyroidism; Essential hypertension; DJD; Mixed hyperlipidemia; Vitamin D deficiency; Medication management; Depression, major, recurrent, in partial remission (HCC); BMI 25.0-25.9,adult; Medicare annual wellness visit, subsequent; Gastroesophageal reflux disease without esophagitis; Prediabetes; Osteopenia; Aortic atherosclerosis (HCC); Essential tremor; Type 2 diabetes mellitus with stage 2 chronic kidney disease, without long-term current use of insulin (HCC); and Hyperlipidemia associated with type 2 diabetes mellitus (HCC) on their problem list.   Observations/Objective:  BP 168/90   P 64   T 97 F   R 16   Ht 5\' 2"     Wt 143 lb 12.8 oz    BMI 26.30  BP rechecked at 150/80  HEENT - WNL. Neck - supple.  Chest - Clear equal BS. Cor - Nl HS. RRR w/o sig MGR. PP 1(+). No edema. MS- FROM w/o deformities.  Gait Nl. Neuro -  Nl w/o focal abnormalities.  Assessment and Plan:  1. Essential hypertension  - Advised freq BP monitoring & call if remains elevated over 145/90  2. Essential tremor  3. Palpitations  - EKG - NSR - WNL - No acute changes  4. Screening for ischemic heart disease  - EKG  I  discussed the assessment and treatment plan with the patient. The patient was provided an opportunity to ask questions and all were answered. The patient agreed with the plan and demonstrated an understanding of the instructions.  Kirtland Bouchard, MD

## 2020-01-01 ENCOUNTER — Encounter: Payer: Self-pay | Admitting: Internal Medicine

## 2020-01-01 DIAGNOSIS — E1169 Type 2 diabetes mellitus with other specified complication: Secondary | ICD-10-CM | POA: Insufficient documentation

## 2020-01-01 DIAGNOSIS — N182 Chronic kidney disease, stage 2 (mild): Secondary | ICD-10-CM | POA: Insufficient documentation

## 2020-01-01 DIAGNOSIS — E1122 Type 2 diabetes mellitus with diabetic chronic kidney disease: Secondary | ICD-10-CM | POA: Insufficient documentation

## 2020-01-01 NOTE — Patient Instructions (Signed)

## 2020-01-02 ENCOUNTER — Other Ambulatory Visit: Payer: Self-pay

## 2020-01-02 ENCOUNTER — Ambulatory Visit (INDEPENDENT_AMBULATORY_CARE_PROVIDER_SITE_OTHER): Payer: Medicare Other | Admitting: Internal Medicine

## 2020-01-02 VITALS — BP 168/90 | HR 64 | Temp 97.0°F | Resp 16 | Ht 62.0 in | Wt 143.8 lb

## 2020-01-02 DIAGNOSIS — Z136 Encounter for screening for cardiovascular disorders: Secondary | ICD-10-CM

## 2020-01-02 DIAGNOSIS — I10 Essential (primary) hypertension: Secondary | ICD-10-CM

## 2020-01-02 DIAGNOSIS — G250 Essential tremor: Secondary | ICD-10-CM

## 2020-01-02 DIAGNOSIS — R002 Palpitations: Secondary | ICD-10-CM | POA: Diagnosis not present

## 2020-01-12 ENCOUNTER — Other Ambulatory Visit: Payer: Self-pay | Admitting: Internal Medicine

## 2020-01-12 MED ORDER — DEXAMETHASONE 4 MG PO TABS: ORAL_TABLET | ORAL | 0 refills | Status: AC

## 2020-01-23 ENCOUNTER — Emergency Department (HOSPITAL_COMMUNITY): Payer: No Typology Code available for payment source

## 2020-01-23 ENCOUNTER — Inpatient Hospital Stay (HOSPITAL_COMMUNITY)
Admission: EM | Admit: 2020-01-23 | Discharge: 2020-02-09 | DRG: 492 | Disposition: E | Payer: No Typology Code available for payment source | Attending: Orthopedic Surgery | Admitting: Orthopedic Surgery

## 2020-01-23 ENCOUNTER — Encounter (HOSPITAL_COMMUNITY): Admission: EM | Disposition: E | Payer: Self-pay | Source: Home / Self Care

## 2020-01-23 DIAGNOSIS — Z79890 Hormone replacement therapy (postmenopausal): Secondary | ICD-10-CM

## 2020-01-23 DIAGNOSIS — Z20822 Contact with and (suspected) exposure to covid-19: Secondary | ICD-10-CM | POA: Diagnosis not present

## 2020-01-23 DIAGNOSIS — D72829 Elevated white blood cell count, unspecified: Secondary | ICD-10-CM | POA: Diagnosis present

## 2020-01-23 DIAGNOSIS — S2241XA Multiple fractures of ribs, right side, initial encounter for closed fracture: Secondary | ICD-10-CM | POA: Diagnosis present

## 2020-01-23 DIAGNOSIS — S129XXA Fracture of neck, unspecified, initial encounter: Secondary | ICD-10-CM | POA: Diagnosis not present

## 2020-01-23 DIAGNOSIS — T148XXA Other injury of unspecified body region, initial encounter: Secondary | ICD-10-CM

## 2020-01-23 DIAGNOSIS — S15109A Unspecified injury of unspecified vertebral artery, initial encounter: Secondary | ICD-10-CM

## 2020-01-23 DIAGNOSIS — D62 Acute posthemorrhagic anemia: Secondary | ICD-10-CM | POA: Diagnosis not present

## 2020-01-23 DIAGNOSIS — S82301C Unspecified fracture of lower end of right tibia, initial encounter for open fracture type IIIA, IIIB, or IIIC: Secondary | ICD-10-CM | POA: Diagnosis not present

## 2020-01-23 DIAGNOSIS — S2001XA Contusion of right breast, initial encounter: Secondary | ICD-10-CM | POA: Diagnosis present

## 2020-01-23 DIAGNOSIS — M5410 Radiculopathy, site unspecified: Secondary | ICD-10-CM | POA: Diagnosis present

## 2020-01-23 DIAGNOSIS — S82852C Displaced trimalleolar fracture of left lower leg, initial encounter for open fracture type IIIA, IIIB, or IIIC: Principal | ICD-10-CM | POA: Diagnosis present

## 2020-01-23 DIAGNOSIS — S12111A Posterior displaced Type II dens fracture, initial encounter for closed fracture: Secondary | ICD-10-CM | POA: Diagnosis present

## 2020-01-23 DIAGNOSIS — E079 Disorder of thyroid, unspecified: Secondary | ICD-10-CM | POA: Diagnosis present

## 2020-01-23 DIAGNOSIS — S3993XA Unspecified injury of pelvis, initial encounter: Secondary | ICD-10-CM | POA: Diagnosis not present

## 2020-01-23 DIAGNOSIS — S82451A Displaced comminuted fracture of shaft of right fibula, initial encounter for closed fracture: Secondary | ICD-10-CM | POA: Diagnosis not present

## 2020-01-23 DIAGNOSIS — E785 Hyperlipidemia, unspecified: Secondary | ICD-10-CM | POA: Diagnosis present

## 2020-01-23 DIAGNOSIS — E1165 Type 2 diabetes mellitus with hyperglycemia: Secondary | ICD-10-CM | POA: Diagnosis present

## 2020-01-23 DIAGNOSIS — Z885 Allergy status to narcotic agent status: Secondary | ICD-10-CM

## 2020-01-23 DIAGNOSIS — S12110A Anterior displaced Type II dens fracture, initial encounter for closed fracture: Secondary | ICD-10-CM | POA: Diagnosis present

## 2020-01-23 DIAGNOSIS — S12100A Unspecified displaced fracture of second cervical vertebra, initial encounter for closed fracture: Secondary | ICD-10-CM | POA: Diagnosis not present

## 2020-01-23 DIAGNOSIS — R52 Pain, unspecified: Secondary | ICD-10-CM

## 2020-01-23 DIAGNOSIS — S15101A Unspecified injury of right vertebral artery, initial encounter: Secondary | ICD-10-CM | POA: Diagnosis not present

## 2020-01-23 DIAGNOSIS — Z66 Do not resuscitate: Secondary | ICD-10-CM | POA: Diagnosis present

## 2020-01-23 DIAGNOSIS — I959 Hypotension, unspecified: Secondary | ICD-10-CM | POA: Diagnosis not present

## 2020-01-23 DIAGNOSIS — I639 Cerebral infarction, unspecified: Secondary | ICD-10-CM | POA: Diagnosis not present

## 2020-01-23 DIAGNOSIS — S12120A Other displaced dens fracture, initial encounter for closed fracture: Secondary | ICD-10-CM | POA: Diagnosis present

## 2020-01-23 DIAGNOSIS — M1990 Unspecified osteoarthritis, unspecified site: Secondary | ICD-10-CM | POA: Diagnosis present

## 2020-01-23 DIAGNOSIS — I110 Hypertensive heart disease with heart failure: Secondary | ICD-10-CM | POA: Diagnosis present

## 2020-01-23 DIAGNOSIS — F419 Anxiety disorder, unspecified: Secondary | ICD-10-CM | POA: Diagnosis present

## 2020-01-23 DIAGNOSIS — I471 Supraventricular tachycardia: Secondary | ICD-10-CM | POA: Diagnosis not present

## 2020-01-23 DIAGNOSIS — S3991XA Unspecified injury of abdomen, initial encounter: Secondary | ICD-10-CM | POA: Diagnosis not present

## 2020-01-23 DIAGNOSIS — R519 Headache, unspecified: Secondary | ICD-10-CM | POA: Diagnosis not present

## 2020-01-23 DIAGNOSIS — E872 Acidosis: Secondary | ICD-10-CM | POA: Diagnosis not present

## 2020-01-23 DIAGNOSIS — S12190A Other displaced fracture of second cervical vertebra, initial encounter for closed fracture: Secondary | ICD-10-CM

## 2020-01-23 DIAGNOSIS — E875 Hyperkalemia: Secondary | ICD-10-CM | POA: Diagnosis not present

## 2020-01-23 DIAGNOSIS — K219 Gastro-esophageal reflux disease without esophagitis: Secondary | ICD-10-CM | POA: Diagnosis present

## 2020-01-23 DIAGNOSIS — S82892A Other fracture of left lower leg, initial encounter for closed fracture: Secondary | ICD-10-CM

## 2020-01-23 DIAGNOSIS — J449 Chronic obstructive pulmonary disease, unspecified: Secondary | ICD-10-CM | POA: Diagnosis present

## 2020-01-23 DIAGNOSIS — I7774 Dissection of vertebral artery: Secondary | ICD-10-CM | POA: Diagnosis not present

## 2020-01-23 DIAGNOSIS — R069 Unspecified abnormalities of breathing: Secondary | ICD-10-CM

## 2020-01-23 DIAGNOSIS — Z96653 Presence of artificial knee joint, bilateral: Secondary | ICD-10-CM | POA: Diagnosis present

## 2020-01-23 DIAGNOSIS — S2249XA Multiple fractures of ribs, unspecified side, initial encounter for closed fracture: Secondary | ICD-10-CM

## 2020-01-23 DIAGNOSIS — K921 Melena: Secondary | ICD-10-CM | POA: Diagnosis not present

## 2020-01-23 DIAGNOSIS — I5032 Chronic diastolic (congestive) heart failure: Secondary | ICD-10-CM | POA: Diagnosis present

## 2020-01-23 DIAGNOSIS — S82302C Unspecified fracture of lower end of left tibia, initial encounter for open fracture type IIIA, IIIB, or IIIC: Secondary | ICD-10-CM | POA: Diagnosis not present

## 2020-01-23 DIAGNOSIS — S82892B Other fracture of left lower leg, initial encounter for open fracture type I or II: Secondary | ICD-10-CM | POA: Diagnosis not present

## 2020-01-23 DIAGNOSIS — K08409 Partial loss of teeth, unspecified cause, unspecified class: Secondary | ICD-10-CM | POA: Diagnosis present

## 2020-01-23 DIAGNOSIS — Z8249 Family history of ischemic heart disease and other diseases of the circulatory system: Secondary | ICD-10-CM

## 2020-01-23 DIAGNOSIS — S82899A Other fracture of unspecified lower leg, initial encounter for closed fracture: Secondary | ICD-10-CM

## 2020-01-23 DIAGNOSIS — E1129 Type 2 diabetes mellitus with other diabetic kidney complication: Secondary | ICD-10-CM | POA: Diagnosis present

## 2020-01-23 DIAGNOSIS — I493 Ventricular premature depolarization: Secondary | ICD-10-CM | POA: Diagnosis not present

## 2020-01-23 DIAGNOSIS — Z881 Allergy status to other antibiotic agents status: Secondary | ICD-10-CM

## 2020-01-23 DIAGNOSIS — Z7982 Long term (current) use of aspirin: Secondary | ICD-10-CM

## 2020-01-23 DIAGNOSIS — Z79899 Other long term (current) drug therapy: Secondary | ICD-10-CM

## 2020-01-23 DIAGNOSIS — F329 Major depressive disorder, single episode, unspecified: Secondary | ICD-10-CM | POA: Diagnosis present

## 2020-01-23 DIAGNOSIS — S8992XA Unspecified injury of left lower leg, initial encounter: Secondary | ICD-10-CM | POA: Diagnosis not present

## 2020-01-23 DIAGNOSIS — S199XXA Unspecified injury of neck, initial encounter: Secondary | ICD-10-CM | POA: Diagnosis not present

## 2020-01-23 DIAGNOSIS — Z23 Encounter for immunization: Secondary | ICD-10-CM

## 2020-01-23 DIAGNOSIS — S15102A Unspecified injury of left vertebral artery, initial encounter: Secondary | ICD-10-CM | POA: Diagnosis not present

## 2020-01-23 DIAGNOSIS — Z7984 Long term (current) use of oral hypoglycemic drugs: Secondary | ICD-10-CM

## 2020-01-23 DIAGNOSIS — K922 Gastrointestinal hemorrhage, unspecified: Secondary | ICD-10-CM

## 2020-01-23 DIAGNOSIS — Z419 Encounter for procedure for purposes other than remedying health state, unspecified: Secondary | ICD-10-CM

## 2020-01-23 DIAGNOSIS — S0990XA Unspecified injury of head, initial encounter: Secondary | ICD-10-CM | POA: Diagnosis not present

## 2020-01-23 DIAGNOSIS — E039 Hypothyroidism, unspecified: Secondary | ICD-10-CM | POA: Diagnosis present

## 2020-01-23 DIAGNOSIS — Y92410 Unspecified street and highway as the place of occurrence of the external cause: Secondary | ICD-10-CM

## 2020-01-23 DIAGNOSIS — G4700 Insomnia, unspecified: Secondary | ICD-10-CM | POA: Diagnosis present

## 2020-01-23 DIAGNOSIS — S299XXA Unspecified injury of thorax, initial encounter: Secondary | ICD-10-CM | POA: Diagnosis not present

## 2020-01-23 DIAGNOSIS — Z888 Allergy status to other drugs, medicaments and biological substances status: Secondary | ICD-10-CM

## 2020-01-23 HISTORY — PX: EXTERNAL FIXATION LEG: SHX1549

## 2020-01-23 LAB — COMPREHENSIVE METABOLIC PANEL
ALT: 45 U/L — ABNORMAL HIGH (ref 0–44)
AST: 57 U/L — ABNORMAL HIGH (ref 15–41)
Albumin: 3.5 g/dL (ref 3.5–5.0)
Alkaline Phosphatase: 75 U/L (ref 38–126)
Anion gap: 11 (ref 5–15)
BUN: 24 mg/dL — ABNORMAL HIGH (ref 8–23)
CO2: 28 mmol/L (ref 22–32)
Calcium: 8.8 mg/dL — ABNORMAL LOW (ref 8.9–10.3)
Chloride: 96 mmol/L — ABNORMAL LOW (ref 98–111)
Creatinine, Ser: 0.91 mg/dL (ref 0.44–1.00)
GFR calc Af Amer: >60 mL/min (ref >60)
GFR calc non Af Amer: 58 mL/min — ABNORMAL LOW (ref >60)
Glucose, Bld: 333 mg/dL — ABNORMAL HIGH (ref 70–99)
Potassium: 4 mmol/L (ref 3.5–5.1)
Sodium: 135 mmol/L (ref 135–145)
Total Bilirubin: 0.7 mg/dL (ref 0.3–1.2)
Total Protein: 6.1 g/dL — ABNORMAL LOW (ref 6.5–8.1)

## 2020-01-23 LAB — I-STAT CHEM 8, ED
BUN: 26 mg/dL — ABNORMAL HIGH (ref 8–23)
Calcium, Ion: 1.2 mmol/L (ref 1.15–1.40)
Chloride: 98 mmol/L (ref 98–111)
Creatinine, Ser: 0.8 mg/dL (ref 0.44–1.00)
Glucose, Bld: 315 mg/dL — ABNORMAL HIGH (ref 70–99)
HCT: 35 % — ABNORMAL LOW (ref 36.0–46.0)
Hemoglobin: 11.9 g/dL — ABNORMAL LOW (ref 12.0–15.0)
Potassium: 3.9 mmol/L (ref 3.5–5.1)
Sodium: 136 mmol/L (ref 135–145)
TCO2: 32 mmol/L (ref 22–32)

## 2020-01-23 LAB — RESPIRATORY PANEL BY RT PCR (FLU A&B, COVID)
Influenza A by PCR: NEGATIVE
Influenza B by PCR: NEGATIVE
SARS Coronavirus 2 by RT PCR: NEGATIVE

## 2020-01-23 LAB — SAMPLE TO BLOOD BANK

## 2020-01-23 LAB — CBC
HCT: 35 % — ABNORMAL LOW (ref 36.0–46.0)
Hemoglobin: 11.3 g/dL — ABNORMAL LOW (ref 12.0–15.0)
MCH: 31.3 pg (ref 26.0–34.0)
MCHC: 32.3 g/dL (ref 30.0–36.0)
MCV: 97 fL (ref 80.0–100.0)
Platelets: 325 10*3/uL (ref 150–400)
RBC: 3.61 MIL/uL — ABNORMAL LOW (ref 3.87–5.11)
RDW: 13 % (ref 11.5–15.5)
WBC: 14.7 10*3/uL — ABNORMAL HIGH (ref 4.0–10.5)
nRBC: 0 % (ref 0.0–0.2)

## 2020-01-23 LAB — LACTIC ACID, PLASMA: Lactic Acid, Venous: 1.3 mmol/L (ref 0.5–1.9)

## 2020-01-23 LAB — ETHANOL: Alcohol, Ethyl (B): <10 mg/dL (ref <10)

## 2020-01-23 LAB — PROTIME-INR
INR: 1 (ref 0.8–1.2)
Prothrombin Time: 12.7 seconds (ref 11.4–15.2)

## 2020-01-23 SURGERY — EXTERNAL FIXATION, LOWER EXTREMITY
Anesthesia: Monitor Anesthesia Care | Site: Leg Lower | Laterality: Left

## 2020-01-23 MED ORDER — FENTANYL CITRATE (PF) 250 MCG/5ML IJ SOLN
INTRAMUSCULAR | Status: AC
  Filled 2020-01-23: qty 5

## 2020-01-23 MED ORDER — IOHEXOL 300 MG/ML  SOLN
100.0000 mL | Freq: Once | INTRAMUSCULAR | Status: AC | PRN
  Administered 2020-01-23: 100 mL via INTRAVENOUS

## 2020-01-23 MED ORDER — FENTANYL CITRATE (PF) 100 MCG/2ML IJ SOLN
50.0000 ug | Freq: Once | INTRAMUSCULAR | Status: AC
  Administered 2020-01-23: 50 ug via INTRAVENOUS
  Filled 2020-01-23: qty 2

## 2020-01-23 MED ORDER — VANCOMYCIN HCL 1000 MG IV SOLR
INTRAVENOUS | Status: AC
  Filled 2020-01-23: qty 1000

## 2020-01-23 MED ORDER — PROPOFOL 10 MG/ML IV BOLUS
INTRAVENOUS | Status: AC
  Filled 2020-01-23: qty 20

## 2020-01-23 MED ORDER — IOHEXOL 350 MG/ML SOLN
60.0000 mL | Freq: Once | INTRAVENOUS | Status: AC | PRN
  Administered 2020-01-23: 60 mL via INTRAVENOUS

## 2020-01-23 MED ORDER — CLINDAMYCIN PHOSPHATE 900 MG/50ML IV SOLN
900.0000 mg | Freq: Once | INTRAVENOUS | Status: AC
  Administered 2020-01-23: 900 mg via INTRAVENOUS
  Filled 2020-01-23: qty 50

## 2020-01-23 MED ORDER — BUPIVACAINE HCL (PF) 0.25 % IJ SOLN
INTRAMUSCULAR | Status: AC
  Filled 2020-01-23: qty 30

## 2020-01-23 MED ORDER — TETANUS-DIPHTH-ACELL PERTUSSIS 5-2.5-18.5 LF-MCG/0.5 IM SUSP
0.5000 mL | Freq: Once | INTRAMUSCULAR | Status: AC
  Administered 2020-01-23: 0.5 mL via INTRAMUSCULAR
  Filled 2020-01-23: qty 0.5

## 2020-01-23 SURGICAL SUPPLY — 76 items
BANDAGE ESMARK 6X9 LF (GAUZE/BANDAGES/DRESSINGS) ×1 IMPLANT
BAR GLASS FIBER EXFX 11X350 (EXFIX) ×6 IMPLANT
BENZOIN TINCTURE PRP APPL 2/3 (GAUZE/BANDAGES/DRESSINGS) ×3 IMPLANT
BNDG COHESIVE 6X5 TAN STRL LF (GAUZE/BANDAGES/DRESSINGS) IMPLANT
BNDG ELASTIC 4X5.8 VLCR STR LF (GAUZE/BANDAGES/DRESSINGS) ×3 IMPLANT
BNDG ELASTIC 6X5.8 VLCR STR LF (GAUZE/BANDAGES/DRESSINGS) IMPLANT
BNDG ESMARK 6X9 LF (GAUZE/BANDAGES/DRESSINGS) ×3
BNDG GAUZE ELAST 4 BULKY (GAUZE/BANDAGES/DRESSINGS) IMPLANT
CANISTER WOUND CARE 500ML ATS (WOUND CARE) ×3 IMPLANT
CAP PROTECTIVE TRANSFX EX-FX (EXFIX) ×3 IMPLANT
CLAMP BLUE BAR TO PIN (EXFIX) ×6 IMPLANT
CLEANER TIP ELECTROSURG 2X2 (MISCELLANEOUS) ×3 IMPLANT
CLOSURE WOUND 1/2 X4 (GAUZE/BANDAGES/DRESSINGS)
COVER SURGICAL LIGHT HANDLE (MISCELLANEOUS) ×3 IMPLANT
COVER WAND RF STERILE (DRAPES) IMPLANT
CUFF TOURN SGL QUICK 18X4 (TOURNIQUET CUFF) IMPLANT
CUFF TOURN SGL QUICK 24 (TOURNIQUET CUFF)
CUFF TOURN SGL QUICK 34 (TOURNIQUET CUFF)
CUFF TRNQT CYL 24X4X16.5-23 (TOURNIQUET CUFF) IMPLANT
CUFF TRNQT CYL 34X4.125X (TOURNIQUET CUFF) IMPLANT
DRAPE C-ARM 42X72 X-RAY (DRAPES) IMPLANT
DRAPE C-ARMOR (DRAPES) ×3 IMPLANT
DRAPE OEC MINIVIEW 54X84 (DRAPES) IMPLANT
DRAPE U-SHAPE 47X51 STRL (DRAPES) ×3 IMPLANT
DRSG ADAPTIC 3X8 NADH LF (GAUZE/BANDAGES/DRESSINGS) ×3 IMPLANT
DRSG VAC ATS SM SENSATRAC (GAUZE/BANDAGES/DRESSINGS) ×3 IMPLANT
DRSG XEROFORM 1X8 (GAUZE/BANDAGES/DRESSINGS) ×3 IMPLANT
ELECT REM PT RETURN 9FT ADLT (ELECTROSURGICAL) ×3
ELECTRODE REM PT RTRN 9FT ADLT (ELECTROSURGICAL) ×1 IMPLANT
EVACUATOR 1/8 PVC DRAIN (DRAIN) IMPLANT
GAUZE SPONGE 4X4 12PLY STRL (GAUZE/BANDAGES/DRESSINGS) ×3 IMPLANT
GAUZE SPONGE 4X4 12PLY STRL LF (GAUZE/BANDAGES/DRESSINGS) ×3 IMPLANT
GLOVE BIOGEL PI IND STRL 6.5 (GLOVE) ×2 IMPLANT
GLOVE BIOGEL PI IND STRL 7.0 (GLOVE) ×2 IMPLANT
GLOVE BIOGEL PI IND STRL 8 (GLOVE) ×2 IMPLANT
GLOVE BIOGEL PI INDICATOR 6.5 (GLOVE) ×4
GLOVE BIOGEL PI INDICATOR 7.0 (GLOVE) ×4
GLOVE BIOGEL PI INDICATOR 8 (GLOVE) ×4
GLOVE ECLIPSE 8.0 STRL XLNG CF (GLOVE) ×3 IMPLANT
GLOVE SURG SS PI 7.0 STRL IVOR (GLOVE) ×3 IMPLANT
GOWN STRL REUS W/ TWL LRG LVL3 (GOWN DISPOSABLE) ×5 IMPLANT
GOWN STRL REUS W/TWL LRG LVL3 (GOWN DISPOSABLE) ×15
KIT BASIN OR (CUSTOM PROCEDURE TRAY) ×3 IMPLANT
KIT TURNOVER KIT B (KITS) ×3 IMPLANT
MANIFOLD NEPTUNE II (INSTRUMENTS) ×3 IMPLANT
NEEDLE 22X1 1/2 (OR ONLY) (NEEDLE) IMPLANT
NS IRRIG 1000ML POUR BTL (IV SOLUTION) ×3 IMPLANT
PACK ORTHO EXTREMITY (CUSTOM PROCEDURE TRAY) ×3 IMPLANT
PAD ARMBOARD 7.5X6 YLW CONV (MISCELLANEOUS) ×6 IMPLANT
PAD CAST 4YDX4 CTTN HI CHSV (CAST SUPPLIES) ×1 IMPLANT
PADDING CAST COTTON 4X4 STRL (CAST SUPPLIES) ×3
PADDING CAST COTTON 6X4 STRL (CAST SUPPLIES) IMPLANT
PIN CLAMP 2BAR 75MM BLUE (EXFIX) ×3 IMPLANT
PIN HALF YELLOW 5X160X35 (EXFIX) ×6 IMPLANT
PIN TRANSFIXING 5.0 (EXFIX) ×3 IMPLANT
SET INTERPULSE LAVAGE W/TIP (ORTHOPEDIC DISPOSABLE SUPPLIES) ×3 IMPLANT
SOAP 2 % CHG 4 OZ (WOUND CARE) ×3 IMPLANT
SPONGE LAP 18X18 RF (DISPOSABLE) IMPLANT
STAPLER VISISTAT 35W (STAPLE) IMPLANT
STOCKINETTE IMPERVIOUS LG (DRAPES) IMPLANT
STRIP CLOSURE SKIN 1/2X4 (GAUZE/BANDAGES/DRESSINGS) IMPLANT
SUCTION FRAZIER HANDLE 10FR (MISCELLANEOUS)
SUCTION TUBE FRAZIER 10FR DISP (MISCELLANEOUS) IMPLANT
SUT ETHILON 3 0 PS 1 (SUTURE) ×3 IMPLANT
SUT VIC AB 0 CT1 27 (SUTURE)
SUT VIC AB 0 CT1 27XBRD ANBCTR (SUTURE) IMPLANT
SUT VIC AB 2-0 CT1 27 (SUTURE)
SUT VIC AB 2-0 CT1 TAPERPNT 27 (SUTURE) IMPLANT
SYR CONTROL 10ML LL (SYRINGE) ×3 IMPLANT
TOWEL GREEN STERILE (TOWEL DISPOSABLE) ×3 IMPLANT
TOWEL GREEN STERILE FF (TOWEL DISPOSABLE) ×6 IMPLANT
TUBE CONNECTING 12'X1/4 (SUCTIONS) ×1
TUBE CONNECTING 12X1/4 (SUCTIONS) ×2 IMPLANT
UNDERPAD 30X30 (UNDERPADS AND DIAPERS) ×3 IMPLANT
WATER STERILE IRR 1000ML POUR (IV SOLUTION) ×3 IMPLANT
YANKAUER SUCT BULB TIP NO VENT (SUCTIONS) ×3 IMPLANT
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
## 2020-01-23 NOTE — ED Notes (Addendum)
Assumed care of pt. Pt alert, speaking in full sentences. Breathing easy, non-labored. Wet to dry dressing applied to open fracture. Distal pulses strong and palpable. Pt denies pain. Skin warm and dry, cap refill less than 3 sec Pt taken to and from xray without incidence.  Surgical PA at bedside to consent pt for surgery All labs drawn, labeled with 2 pt identifiers, and sent to lab Covid swab collected, labeled with 2 pt identifiers, and sent to lab
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
## 2020-01-23 NOTE — ED Notes (Addendum)
Pt transported to OR in NAD with all belongings and surgical consent on continuous monitors by this RN. Pt alert, speaking in full sentences. Breathing easy, non-labored. VSS

## 2020-01-23 NOTE — ED Notes (Signed)
Pt taken to and from CT. Placed back on monitors. Placed in aspen collar
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
## 2020-01-23 NOTE — ED Notes (Signed)
OR ready for pt. CT called and notified, will come and get the pt in a few minutes
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
## 2020-01-23 NOTE — Progress Notes (Signed)
Orthopedic Tech Progress Note Patient Details:  [name] 04-16-1933 [id number]  Patient ID: [name], female   DOB: 06/14/1933, 86 y.o.   MRN: [id number]  Nurse advised that the short leg splint was not needed after speaking with the doctor. Ancil Linsey 01/28/2020, 11:09 PM

## 2020-01-23 NOTE — ED Notes (Signed)
Pt taken to and from CT without incidence 
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
## 2020-01-23 NOTE — ED Notes (Signed)
Patient daughter Teryl Lucy calling asking when we can please give her a call  She would like an update on patient  [phone]

## 2020-01-23 NOTE — Anesthesia Preprocedure Evaluation (Addendum)
Anesthesia Evaluation  Patient identified by MRN, date of birth, ID band Patient awake    Reviewed: Allergy & Precautions, NPO status , Patient's Chart, lab work & pertinent test results  History of Anesthesia Complications Negative for: history of anesthetic complications  Airway Mallampati: III  TM Distance: >3 FB Neck ROM: Limited  Mouth opening: Limited Mouth Opening  Dental  (+) Edentulous Lower, Caps, Dental Advisory Given   Pulmonary COPD,  COPD inhaler,    breath sounds clear to auscultation       Cardiovascular hypertension, Pt. on medications and Pt. on home beta blockers (-) angina Rhythm:Regular Rate:Normal  '16 ECHO: Normal LV size with mild focal basal septal hypertrophy, EF 55-60%. Normal RV size and systolic function. No significant valvular abnormalities    Neuro/Psych Anxiety Depression type 3 fracture of the C2 base with posterior and lateral displacement of the inferior fragment, measuring approximately 5 mm in each direction. Fracture extends to the left transverse foramen Probable injury to bilateral vertebral arteries: vasc recommends ASA  S/p cerebral aneurysm repair  C-spine not cleared    GI/Hepatic Neg liver ROS, GERD  Controlled,  Endo/Other  diabetes (glu 315), Oral Hypoglycemic AgentsHypothyroidism   Renal/GU Renal InsufficiencyRenal disease     Musculoskeletal  (+) Arthritis ,   Abdominal   Peds  Hematology negative hematology ROS (+)   Anesthesia Other Findings   Reproductive/Obstetrics                           Anesthesia Physical Anesthesia Plan  ASA: III  Anesthesia Plan: Regional and MAC   Post-op Pain Management:    Induction:   PONV Risk Score and Plan: 2 and Ondansetron and Dexamethasone  Airway Management Planned: Natural Airway and Simple Face Mask  Additional Equipment:   Intra-op Plan:   Post-operative Plan:   Informed Consent: I  have reviewed the patients History and Physical, chart, labs and discussed the procedure including the risks, benefits and alternatives for the proposed anesthesia with the patient or authorized representative who has indicated his/her understanding and acceptance.     Dental advisory given  Plan Discussed with: CRNA and Surgeon  Anesthesia Plan Comments: (Plan routine monitors, regional popliteal and adductor canal blocks with MAC Vascular and Neurosurgery recommend avoidance of intubation given odontoid fracture with vascular involvement Discussed with pt's daughter, and significant other (who is an anesthesiologist))       Anesthesia Quick Evaluation
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
## 2020-01-24 ENCOUNTER — Inpatient Hospital Stay (HOSPITAL_COMMUNITY): Payer: No Typology Code available for payment source

## 2020-01-24 ENCOUNTER — Emergency Department (HOSPITAL_COMMUNITY): Payer: No Typology Code available for payment source | Admitting: Anesthesiology

## 2020-01-24 DIAGNOSIS — I471 Supraventricular tachycardia: Secondary | ICD-10-CM | POA: Diagnosis not present

## 2020-01-24 DIAGNOSIS — S12110A Anterior displaced Type II dens fracture, initial encounter for closed fracture: Secondary | ICD-10-CM | POA: Diagnosis not present

## 2020-01-24 DIAGNOSIS — S12111A Posterior displaced Type II dens fracture, initial encounter for closed fracture: Secondary | ICD-10-CM | POA: Diagnosis not present

## 2020-01-24 DIAGNOSIS — S2249XA Multiple fractures of ribs, unspecified side, initial encounter for closed fracture: Secondary | ICD-10-CM | POA: Diagnosis not present

## 2020-01-24 DIAGNOSIS — S82892A Other fracture of left lower leg, initial encounter for closed fracture: Secondary | ICD-10-CM | POA: Diagnosis not present

## 2020-01-24 DIAGNOSIS — F329 Major depressive disorder, single episode, unspecified: Secondary | ICD-10-CM | POA: Diagnosis present

## 2020-01-24 DIAGNOSIS — D631 Anemia in chronic kidney disease: Secondary | ICD-10-CM | POA: Diagnosis not present

## 2020-01-24 DIAGNOSIS — E1122 Type 2 diabetes mellitus with diabetic chronic kidney disease: Secondary | ICD-10-CM | POA: Diagnosis not present

## 2020-01-24 DIAGNOSIS — F419 Anxiety disorder, unspecified: Secondary | ICD-10-CM | POA: Diagnosis present

## 2020-01-24 DIAGNOSIS — R52 Pain, unspecified: Secondary | ICD-10-CM | POA: Diagnosis not present

## 2020-01-24 DIAGNOSIS — I7774 Dissection of vertebral artery: Secondary | ICD-10-CM | POA: Diagnosis not present

## 2020-01-24 DIAGNOSIS — S82892B Other fracture of left lower leg, initial encounter for open fracture type I or II: Secondary | ICD-10-CM | POA: Diagnosis not present

## 2020-01-24 DIAGNOSIS — Z20822 Contact with and (suspected) exposure to covid-19: Secondary | ICD-10-CM | POA: Diagnosis not present

## 2020-01-24 DIAGNOSIS — I129 Hypertensive chronic kidney disease with stage 1 through stage 4 chronic kidney disease, or unspecified chronic kidney disease: Secondary | ICD-10-CM | POA: Diagnosis not present

## 2020-01-24 DIAGNOSIS — S2241XA Multiple fractures of ribs, right side, initial encounter for closed fracture: Secondary | ICD-10-CM | POA: Diagnosis not present

## 2020-01-24 DIAGNOSIS — R109 Unspecified abdominal pain: Secondary | ICD-10-CM | POA: Diagnosis not present

## 2020-01-24 DIAGNOSIS — S8252XD Displaced fracture of medial malleolus of left tibia, subsequent encounter for closed fracture with routine healing: Secondary | ICD-10-CM | POA: Diagnosis not present

## 2020-01-24 DIAGNOSIS — I110 Hypertensive heart disease with heart failure: Secondary | ICD-10-CM | POA: Diagnosis present

## 2020-01-24 DIAGNOSIS — K921 Melena: Secondary | ICD-10-CM | POA: Diagnosis not present

## 2020-01-24 DIAGNOSIS — E872 Acidosis: Secondary | ICD-10-CM | POA: Diagnosis not present

## 2020-01-24 DIAGNOSIS — E039 Hypothyroidism, unspecified: Secondary | ICD-10-CM | POA: Diagnosis not present

## 2020-01-24 DIAGNOSIS — D638 Anemia in other chronic diseases classified elsewhere: Secondary | ICD-10-CM | POA: Diagnosis not present

## 2020-01-24 DIAGNOSIS — K219 Gastro-esophageal reflux disease without esophagitis: Secondary | ICD-10-CM | POA: Diagnosis present

## 2020-01-24 DIAGNOSIS — S15102A Unspecified injury of left vertebral artery, initial encounter: Secondary | ICD-10-CM | POA: Diagnosis not present

## 2020-01-24 DIAGNOSIS — E1165 Type 2 diabetes mellitus with hyperglycemia: Secondary | ICD-10-CM | POA: Diagnosis present

## 2020-01-24 DIAGNOSIS — S82852C Displaced trimalleolar fracture of left lower leg, initial encounter for open fracture type IIIA, IIIB, or IIIC: Secondary | ICD-10-CM | POA: Diagnosis present

## 2020-01-24 DIAGNOSIS — K922 Gastrointestinal hemorrhage, unspecified: Secondary | ICD-10-CM | POA: Diagnosis not present

## 2020-01-24 DIAGNOSIS — E119 Type 2 diabetes mellitus without complications: Secondary | ICD-10-CM | POA: Diagnosis not present

## 2020-01-24 DIAGNOSIS — I5032 Chronic diastolic (congestive) heart failure: Secondary | ICD-10-CM | POA: Diagnosis present

## 2020-01-24 DIAGNOSIS — S12100A Unspecified displaced fracture of second cervical vertebra, initial encounter for closed fracture: Secondary | ICD-10-CM | POA: Diagnosis present

## 2020-01-24 DIAGNOSIS — Z66 Do not resuscitate: Secondary | ICD-10-CM | POA: Diagnosis not present

## 2020-01-24 DIAGNOSIS — D62 Acute posthemorrhagic anemia: Secondary | ICD-10-CM | POA: Diagnosis not present

## 2020-01-24 DIAGNOSIS — E1129 Type 2 diabetes mellitus with other diabetic kidney complication: Secondary | ICD-10-CM | POA: Diagnosis present

## 2020-01-24 DIAGNOSIS — S12190A Other displaced fracture of second cervical vertebra, initial encounter for closed fracture: Secondary | ICD-10-CM | POA: Diagnosis not present

## 2020-01-24 DIAGNOSIS — I639 Cerebral infarction, unspecified: Secondary | ICD-10-CM | POA: Diagnosis not present

## 2020-01-24 DIAGNOSIS — I712 Thoracic aortic aneurysm, without rupture: Secondary | ICD-10-CM | POA: Diagnosis not present

## 2020-01-24 DIAGNOSIS — S82832A Other fracture of upper and lower end of left fibula, initial encounter for closed fracture: Secondary | ICD-10-CM | POA: Diagnosis not present

## 2020-01-24 DIAGNOSIS — S15101A Unspecified injury of right vertebral artery, initial encounter: Secondary | ICD-10-CM | POA: Diagnosis not present

## 2020-01-24 DIAGNOSIS — S82392A Other fracture of lower end of left tibia, initial encounter for closed fracture: Secondary | ICD-10-CM | POA: Diagnosis not present

## 2020-01-24 DIAGNOSIS — S93432A Sprain of tibiofibular ligament of left ankle, initial encounter: Secondary | ICD-10-CM | POA: Diagnosis not present

## 2020-01-24 DIAGNOSIS — Z23 Encounter for immunization: Secondary | ICD-10-CM | POA: Diagnosis not present

## 2020-01-24 DIAGNOSIS — S82842A Displaced bimalleolar fracture of left lower leg, initial encounter for closed fracture: Secondary | ICD-10-CM | POA: Diagnosis not present

## 2020-01-24 DIAGNOSIS — S82832D Other fracture of upper and lower end of left fibula, subsequent encounter for closed fracture with routine healing: Secondary | ICD-10-CM | POA: Diagnosis not present

## 2020-01-24 DIAGNOSIS — S12120A Other displaced dens fracture, initial encounter for closed fracture: Secondary | ICD-10-CM | POA: Diagnosis not present

## 2020-01-24 DIAGNOSIS — I10 Essential (primary) hypertension: Secondary | ICD-10-CM | POA: Diagnosis not present

## 2020-01-24 DIAGNOSIS — S82842D Displaced bimalleolar fracture of left lower leg, subsequent encounter for closed fracture with routine healing: Secondary | ICD-10-CM | POA: Diagnosis not present

## 2020-01-24 DIAGNOSIS — I6523 Occlusion and stenosis of bilateral carotid arteries: Secondary | ICD-10-CM | POA: Diagnosis not present

## 2020-01-24 DIAGNOSIS — E785 Hyperlipidemia, unspecified: Secondary | ICD-10-CM | POA: Diagnosis present

## 2020-01-24 DIAGNOSIS — E079 Disorder of thyroid, unspecified: Secondary | ICD-10-CM | POA: Diagnosis present

## 2020-01-24 DIAGNOSIS — E782 Mixed hyperlipidemia: Secondary | ICD-10-CM | POA: Diagnosis not present

## 2020-01-24 DIAGNOSIS — N182 Chronic kidney disease, stage 2 (mild): Secondary | ICD-10-CM | POA: Diagnosis not present

## 2020-01-24 DIAGNOSIS — R4182 Altered mental status, unspecified: Secondary | ICD-10-CM | POA: Diagnosis not present

## 2020-01-24 DIAGNOSIS — Y92410 Unspecified street and highway as the place of occurrence of the external cause: Secondary | ICD-10-CM | POA: Diagnosis not present

## 2020-01-24 LAB — GLUCOSE, CAPILLARY
Glucose-Capillary: 328 mg/dL — ABNORMAL HIGH (ref 70–99)
Glucose-Capillary: 290 mg/dL — ABNORMAL HIGH (ref 70–99)
Glucose-Capillary: 230 mg/dL — ABNORMAL HIGH (ref 70–99)
Glucose-Capillary: 194 mg/dL — ABNORMAL HIGH (ref 70–99)
Glucose-Capillary: 137 mg/dL — ABNORMAL HIGH (ref 70–99)
Glucose-Capillary: 174 mg/dL — ABNORMAL HIGH (ref 70–99)

## 2020-01-24 LAB — SURGICAL PCR SCREEN
MRSA, PCR: NEGATIVE
Staphylococcus aureus: NEGATIVE

## 2020-01-24 LAB — HEMOGLOBIN A1C
Hgb A1c MFr Bld: 7.9 % — ABNORMAL HIGH (ref 4.8–5.6)
Mean Plasma Glucose: 180.03 mg/dL

## 2020-01-24 MED ORDER — CHLORHEXIDINE GLUCONATE CLOTH 2 % EX PADS
6.0000 | MEDICATED_PAD | Freq: Every day | CUTANEOUS | Status: DC
  Administered 2020-01-24 – 2020-01-29 (×5): 6 via TOPICAL

## 2020-01-24 MED ORDER — METOCLOPRAMIDE HCL 5 MG/ML IJ SOLN
5.0000 mg | Freq: Three times a day (TID) | INTRAMUSCULAR | Status: DC | PRN
  Administered 2020-01-28: 10 mg via INTRAVENOUS
  Filled 2020-01-24: qty 2

## 2020-01-24 MED ORDER — FENTANYL CITRATE (PF) 100 MCG/2ML IJ SOLN: 25.0000 ug | INTRAMUSCULAR | Status: DC | PRN

## 2020-01-24 MED ORDER — STERILE WATER FOR IRRIGATION IR SOLN
Status: DC | PRN
  Administered 2020-01-23: 100 mL

## 2020-01-24 MED ORDER — HYDROCODONE-ACETAMINOPHEN 5-325 MG PO TABS
1.0000 | ORAL_TABLET | ORAL | Status: DC | PRN
  Administered 2020-01-24 (×2): 1 via ORAL
  Administered 2020-01-25: 2 via ORAL
  Administered 2020-01-25 (×3): 1 via ORAL
  Administered 2020-01-26 (×3): 2 via ORAL
  Filled 2020-01-24 (×3): qty 1
  Filled 2020-01-24: qty 2
  Filled 2020-01-24: qty 1
  Filled 2020-01-24 (×3): qty 2
  Filled 2020-01-24: qty 1

## 2020-01-24 MED ORDER — DOCUSATE SODIUM 100 MG PO CAPS: 100.0000 mg | ORAL_CAPSULE | Freq: Two times a day (BID) | ORAL | Status: DC

## 2020-01-24 MED ORDER — INSULIN ASPART 100 UNIT/ML ~~LOC~~ SOLN
5.0000 [IU] | Freq: Once | SUBCUTANEOUS | Status: AC
  Administered 2020-01-24: 5 [IU] via SUBCUTANEOUS

## 2020-01-24 MED ORDER — MUPIROCIN 2 % EX OINT
TOPICAL_OINTMENT | CUTANEOUS | Status: DC | PRN
  Administered 2020-01-24: 1 via TOPICAL

## 2020-01-24 MED ORDER — CHLORHEXIDINE GLUCONATE 4 % EX LIQD
60.0000 mL | Freq: Once | CUTANEOUS | Status: AC
  Administered 2020-01-25: 4 via TOPICAL
  Filled 2020-01-24: qty 30

## 2020-01-24 MED ORDER — LACTATED RINGERS IV SOLN: INTRAVENOUS | Status: DC | PRN

## 2020-01-24 MED ORDER — POVIDONE-IODINE 10 % EX SWAB: 2.0000 "application " | Freq: Once | CUTANEOUS | Status: DC

## 2020-01-24 MED ORDER — ASPIRIN 325 MG PO TABS
325.0000 mg | ORAL_TABLET | Freq: Every day | ORAL | Status: DC
  Administered 2020-01-24 – 2020-01-28 (×5): 325 mg via ORAL
  Filled 2020-01-24 (×6): qty 1

## 2020-01-24 MED ORDER — MORPHINE SULFATE (PF) 2 MG/ML IV SOLN
0.5000 mg | INTRAVENOUS | Status: DC | PRN
  Administered 2020-01-26: 1 mg via INTRAVENOUS
  Filled 2020-01-24: qty 1

## 2020-01-24 MED ORDER — VANCOMYCIN HCL IN DEXTROSE 1-5 GM/200ML-% IV SOLN
1000.0000 mg | Freq: Two times a day (BID) | INTRAVENOUS | Status: AC
  Administered 2020-01-24: 1000 mg via INTRAVENOUS
  Filled 2020-01-24: qty 200

## 2020-01-24 MED ORDER — METHOCARBAMOL 500 MG PO TABS: 500.0000 mg | ORAL_TABLET | Freq: Four times a day (QID) | ORAL | Status: DC | PRN

## 2020-01-24 MED ORDER — CEFAZOLIN SODIUM 1 G IJ SOLR
INTRAMUSCULAR | Status: AC
  Filled 2020-01-24: qty 20

## 2020-01-24 MED ORDER — ACETAMINOPHEN 500 MG PO TABS
1000.0000 mg | ORAL_TABLET | Freq: Four times a day (QID) | ORAL | Status: DC
  Administered 2020-01-24 – 2020-01-29 (×17): 1000 mg via ORAL
  Filled 2020-01-24 (×17): qty 2

## 2020-01-24 MED ORDER — METOCLOPRAMIDE HCL 10 MG PO TABS: 5.0000 mg | ORAL_TABLET | Freq: Three times a day (TID) | ORAL | Status: DC | PRN

## 2020-01-24 MED ORDER — CHLORHEXIDINE GLUCONATE 4 % EX LIQD: 60.0000 mL | Freq: Once | CUTANEOUS | Status: DC

## 2020-01-24 MED ORDER — ONDANSETRON HCL 4 MG PO TABS: 4.0000 mg | ORAL_TABLET | Freq: Four times a day (QID) | ORAL | Status: DC | PRN

## 2020-01-24 MED ORDER — MORPHINE SULFATE (PF) 2 MG/ML IV SOLN: 2.0000 mg | INTRAVENOUS | Status: DC | PRN

## 2020-01-24 MED ORDER — INSULIN ASPART 100 UNIT/ML ~~LOC~~ SOLN
0.0000 [IU] | Freq: Three times a day (TID) | SUBCUTANEOUS | Status: DC
  Administered 2020-01-24: 2 [IU] via SUBCUTANEOUS
  Administered 2020-01-24: 1 [IU] via SUBCUTANEOUS
  Administered 2020-01-24: 3 [IU] via SUBCUTANEOUS
  Administered 2020-01-25: 2 [IU] via SUBCUTANEOUS
  Administered 2020-01-25: 3 [IU] via SUBCUTANEOUS

## 2020-01-24 MED ORDER — CEFAZOLIN SODIUM-DEXTROSE 2-4 GM/100ML-% IV SOLN
2.0000 g | INTRAVENOUS | Status: AC
  Administered 2020-01-25: 2 g via INTRAVENOUS
  Filled 2020-01-24 (×2): qty 100

## 2020-01-24 MED ORDER — FENTANYL CITRATE (PF) 250 MCG/5ML IJ SOLN
INTRAMUSCULAR | Status: DC | PRN
  Administered 2020-01-23: 50 ug via INTRAVENOUS
  Administered 2020-01-24: 25 ug via INTRAVENOUS

## 2020-01-24 MED ORDER — ROPIVACAINE HCL 5 MG/ML IJ SOLN
INTRAMUSCULAR | Status: DC | PRN
  Administered 2020-01-24: 30 mL via PERINEURAL

## 2020-01-24 MED ORDER — VANCOMYCIN HCL IN DEXTROSE 1-5 GM/200ML-% IV SOLN: 1000.0000 mg | INTRAVENOUS | Status: DC

## 2020-01-24 MED ORDER — VANCOMYCIN HCL 1000 MG IV SOLR
INTRAVENOUS | Status: DC | PRN
  Administered 2020-01-24: 1000 mg via TOPICAL

## 2020-01-24 MED ORDER — BUPIVACAINE HCL (PF) 0.25 % IJ SOLN
INTRAMUSCULAR | Status: DC | PRN
  Administered 2020-01-23: 30 mL

## 2020-01-24 MED ORDER — ONDANSETRON HCL 4 MG/2ML IJ SOLN
4.0000 mg | Freq: Four times a day (QID) | INTRAMUSCULAR | Status: DC | PRN
  Administered 2020-01-28: 4 mg via INTRAVENOUS
  Filled 2020-01-24: qty 2

## 2020-01-24 MED ORDER — DOCUSATE SODIUM 100 MG PO CAPS
100.0000 mg | ORAL_CAPSULE | Freq: Two times a day (BID) | ORAL | Status: DC
  Administered 2020-01-24 – 2020-01-28 (×7): 100 mg via ORAL
  Filled 2020-01-24 (×8): qty 1

## 2020-01-24 MED ORDER — INSULIN ASPART 100 UNIT/ML ~~LOC~~ SOLN
SUBCUTANEOUS | Status: AC
  Filled 2020-01-24: qty 1

## 2020-01-24 MED ORDER — PROPOFOL 10 MG/ML IV BOLUS
INTRAVENOUS | Status: DC | PRN
  Administered 2020-01-24: 10 mg via INTRAVENOUS
  Administered 2020-01-24: 20 mg via INTRAVENOUS

## 2020-01-24 MED ORDER — MUPIROCIN 2 % EX OINT
TOPICAL_OINTMENT | CUTANEOUS | Status: AC
  Filled 2020-01-24: qty 22

## 2020-01-24 MED ORDER — ONDANSETRON HCL 4 MG/2ML IJ SOLN: 4.0000 mg | Freq: Four times a day (QID) | INTRAMUSCULAR | Status: DC | PRN

## 2020-01-24 MED ORDER — METHOCARBAMOL 1000 MG/10ML IJ SOLN
500.0000 mg | Freq: Four times a day (QID) | INTRAVENOUS | Status: DC | PRN
  Filled 2020-01-24 (×2): qty 5

## 2020-01-24 MED ORDER — HYDROXYZINE HCL 10 MG PO TABS
10.0000 mg | ORAL_TABLET | ORAL | Status: DC | PRN
  Filled 2020-01-24: qty 1

## 2020-01-24 MED ORDER — ROPIVACAINE HCL 7.5 MG/ML IJ SOLN
INTRAMUSCULAR | Status: DC | PRN
  Administered 2020-01-24: 20 mL via PERINEURAL

## 2020-01-24 MED ORDER — 0.9 % SODIUM CHLORIDE (POUR BTL) OPTIME
TOPICAL | Status: DC | PRN
  Administered 2020-01-23 – 2020-01-24 (×3): 1000 mL

## 2020-01-24 MED ORDER — TRAMADOL HCL 50 MG PO TABS: 50.0000 mg | ORAL_TABLET | Freq: Four times a day (QID) | ORAL | Status: DC | PRN

## 2020-01-24 MED ORDER — CEFAZOLIN SODIUM-DEXTROSE 2-3 GM-%(50ML) IV SOLR
INTRAVENOUS | Status: DC | PRN
  Administered 2020-01-24: 2 g via INTRAVENOUS

## 2020-01-24 MED ORDER — ONDANSETRON 4 MG PO TBDP
4.0000 mg | ORAL_TABLET | Freq: Four times a day (QID) | ORAL | Status: DC | PRN
  Filled 2020-01-24: qty 1

## 2020-01-24 MED ORDER — GABAPENTIN 300 MG PO CAPS
300.0000 mg | ORAL_CAPSULE | Freq: Three times a day (TID) | ORAL | Status: DC
  Administered 2020-01-24 – 2020-01-29 (×14): 300 mg via ORAL
  Filled 2020-01-24 (×14): qty 1

## 2020-01-24 NOTE — Anesthesia Procedure Notes (Signed)
Anesthesia Regional Block: Adductor canal block   Pre-Anesthetic Checklist: ,, timeout performed, Correct Patient, Correct Site, Correct Laterality, Correct Procedure, Correct Position, site marked, Risks and benefits discussed, Surgical consent,  Pre-op evaluation,  At surgeon's request  Laterality: Left and Lower  Prep: chloraprep       Needles:  Injection technique: Single-shot  Needle Type: Echogenic Needle     Needle Length: 9cm  Needle Gauge: 21     Additional Needles:   Procedures:,,,, ultrasound used (permanent image in chart),,,,  Narrative:  Start time: 01/18/2020 11:51 PM End time: 01/24/2020 12:00 AM Injection made incrementally with aspirations every 5 mL.  Performed by: Personally  Anesthesiologist: Jairo Ben, MD  Additional Notes: Pt identified in Holding room.  Monitors applied. Working IV access confirmed. Sterile prep L thigh.  #21ga ECHOgenic needle into adductor canal with US guidance.  20cc 0.75% Ropivacaine injected incrementally after negative test dose.  Patient asymptomatic, VSS, no heme aspirated, tolerated well.  Sandford Craze, MD

## 2020-01-24 NOTE — Progress Notes (Signed)
Rehab Admissions Coordinator Note:  Patient was screened by Clois Dupes for appropriateness for an Inpatient Acute Rehab Consult per PT recs.   At this time, we are recommending Inpatient Rehab consult. Please place order for rehab consult.  Clois Dupes RN MSN 01/24/2020, 11:16 AM  I can be reached at [phone].

## 2020-01-24 NOTE — Anesthesia Postprocedure Evaluation (Signed)
Anesthesia Post Note  Patient: [name]  Procedure(s) Performed: LEFT Excisional debridement of open fracture with open reduction and external fixation of the fracture with application of wound VAC EXTERNAL FIXATION LEG (Left Leg Lower)     Patient location during evaluation: PACU Anesthesia Type: Regional Level of consciousness: awake and alert, patient cooperative and oriented Pain management: pain level controlled Vital Signs Assessment: post-procedure vital signs reviewed and stable Respiratory status: spontaneous breathing, nonlabored ventilation, respiratory function stable and patient connected to nasal cannula oxygen Cardiovascular status: blood pressure returned to baseline and stable Postop Assessment: no apparent nausea or vomiting Anesthetic complications: no    Last Vitals:  Vitals:   01/24/20 0145 01/24/20 0200  BP: (!) 153/80 133/74  Pulse: 67 67  Resp: (!) 25 (!) 22  Temp:    SpO2: 93% 98%    Last Pain:  Vitals:   01/22/2020 1912  TempSrc: Oral                 Aliha Diedrich,E. Aryonna Gunnerson
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
## 2020-01-24 NOTE — Plan of Care (Signed)

## 2020-01-24 NOTE — Transfer of Care (Signed)
Immediate Anesthesia Transfer of Care Note  Patient: [name]  Procedure(s) Performed: EXTERNAL FIXATION LEG (Left Leg Lower)  Patient Location: PACU  Anesthesia Type:MAC combined with regional for post-op pain  Level of Consciousness: awake, alert  and oriented  Airway & Oxygen Therapy: Patient Spontanous Breathing  Post-op Assessment: Report given to RN and Post -op Vital signs reviewed and stable  Post vital signs: Reviewed and stable  Last Vitals:  Vitals Value Taken Time  BP 159/85 01/24/20 0134  Temp 36.5 C 01/24/20 0130  Pulse 70 01/24/20 0137  Resp 29 01/24/20 0137  SpO2 93 % 01/24/20 0137  Vitals shown include unvalidated device data.  Last Pain:  Vitals:   01/22/2020 1912  TempSrc: Oral         Complications: No apparent anesthesia complications
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
## 2020-01-24 NOTE — Progress Notes (Signed)
Spoke with son Abagail Limb on phone. Updated him on mother's condition and visiting policy.

## 2020-01-24 NOTE — Anesthesia Procedure Notes (Signed)
Anesthesia Regional Block: Popliteal block   Pre-Anesthetic Checklist: ,, timeout performed, Correct Patient, Correct Site, Correct Laterality, Correct Procedure, Correct Position, site marked, Risks and benefits discussed, Surgical consent,  Pre-op evaluation,  At surgeon's request  Laterality: Left and Lower  Prep: chloraprep       Needles:   Needle Type: Echogenic Stimulator Needle     Needle Length: 9cm  Needle Gauge: 21     Additional Needles:   Procedures:, nerve stimulator,,, ultrasound used (permanent image in chart),,,,  Narrative:  Start time: 01/24/2020 12:02 AM End time: 01/24/2020 12:06 AM Injection made incrementally with aspirations every 5 mL.  Performed by: Personally  Anesthesiologist: Jairo Ben, MD  Additional Notes: Pt identified in Holding room.  Monitors applied. Working IV access confirmed. Sterile prep L lateral knee.  #21ga ECHOgenic PNS to toe twitch at 0.92mA threshold with US guidance.  30cc 0.5% Ropivacaine injected incrementally after negative test dose.  Patient asymptomatic, VSS, no heme aspirated, tolerated well.  Sandford Craze, MD
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
## 2020-01-24 NOTE — Op Note (Signed)
   01/24/2020  1:33 AM  PATIENT:  [name]  86 y.o. female  PRE-OPERATIVE DIAGNOSIS:  Left grade 2 open ankle Fracture dislocation  POST-OPERATIVE DIAGNOSIS: Open left grade 2 ankle fracture dislocation  PROCEDURE:  Procedure(s): Excisional debridement of open fracture with open reduction and external fixation of the fracture with application of wound VAC EXTERNAL FIXATION LEG  SURGEON:  Surgeon(s): Cammy Copa, MD  ASSISTANT: Karenann Cai  ANESTHESIA:   regional  EBL: 30 ml    Total I/O In: 800 [I.V.:800] Out: -   BLOOD ADMINISTERED: none  DRAINS: Wound VAC left medial incision   LOCAL MEDICATIONS USED: Vancomycin 1 g in the joint  SPECIMEN:  No Specimen  COUNTS:  YES  TOURNIQUET:  * Missing tourniquet times found for documented tourniquets in log: [id number] *  DICTATION: .Other Dictation: Dictation [id number]  PLAN OF CARE: Admit to inpatient   PATIENT DISPOSITION:  PACU - hemodynamically stable

## 2020-01-25 ENCOUNTER — Inpatient Hospital Stay (HOSPITAL_COMMUNITY): Payer: No Typology Code available for payment source

## 2020-01-25 ENCOUNTER — Other Ambulatory Visit: Payer: Self-pay

## 2020-01-25 ENCOUNTER — Encounter (HOSPITAL_COMMUNITY): Payer: Self-pay

## 2020-01-25 ENCOUNTER — Encounter (HOSPITAL_COMMUNITY): Admission: EM | Disposition: E | Payer: Self-pay | Source: Home / Self Care

## 2020-01-25 DIAGNOSIS — I471 Supraventricular tachycardia: Secondary | ICD-10-CM

## 2020-01-25 HISTORY — PX: ORIF ANKLE FRACTURE: SHX5408

## 2020-01-25 HISTORY — PX: EXTERNAL FIXATION REMOVAL: SHX5040

## 2020-01-25 LAB — GLUCOSE, CAPILLARY
Glucose-Capillary: 181 mg/dL — ABNORMAL HIGH (ref 70–99)
Glucose-Capillary: 211 mg/dL — ABNORMAL HIGH (ref 70–99)
Glucose-Capillary: 192 mg/dL — ABNORMAL HIGH (ref 70–99)
Glucose-Capillary: 118 mg/dL — ABNORMAL HIGH (ref 70–99)
Glucose-Capillary: 157 mg/dL — ABNORMAL HIGH (ref 70–99)

## 2020-01-25 LAB — POCT I-STAT, CHEM 8
BUN: 20 mg/dL (ref 8–23)
Calcium, Ion: 1.12 mmol/L — ABNORMAL LOW (ref 1.15–1.40)
Chloride: 100 mmol/L (ref 98–111)
Creatinine, Ser: 0.9 mg/dL (ref 0.44–1.00)
Glucose, Bld: 102 mg/dL — ABNORMAL HIGH (ref 70–99)
HCT: 28 % — ABNORMAL LOW (ref 36.0–46.0)
Hemoglobin: 9.5 g/dL — ABNORMAL LOW (ref 12.0–15.0)
Potassium: 4 mmol/L (ref 3.5–5.1)
Sodium: 136 mmol/L (ref 135–145)
TCO2: 29 mmol/L (ref 22–32)

## 2020-01-25 LAB — TYPE AND SCREEN
ABO/RH(D): O POS
Antibody Screen: NEGATIVE

## 2020-01-25 LAB — CBC
HCT: 27.4 % — ABNORMAL LOW (ref 36.0–46.0)
Hemoglobin: 8.7 g/dL — ABNORMAL LOW (ref 12.0–15.0)
MCH: 30.1 pg (ref 26.0–34.0)
MCHC: 31.8 g/dL (ref 30.0–36.0)
MCV: 94.8 fL (ref 80.0–100.0)
Platelets: 239 10*3/uL (ref 150–400)
RBC: 2.89 MIL/uL — ABNORMAL LOW (ref 3.87–5.11)
RDW: 13.2 % (ref 11.5–15.5)
WBC: 11.6 10*3/uL — ABNORMAL HIGH (ref 4.0–10.5)
nRBC: 0 % (ref 0.0–0.2)

## 2020-01-25 LAB — BASIC METABOLIC PANEL
Anion gap: 9 (ref 5–15)
BUN: 25 mg/dL — ABNORMAL HIGH (ref 8–23)
CO2: 26 mmol/L (ref 22–32)
Calcium: 8.3 mg/dL — ABNORMAL LOW (ref 8.9–10.3)
Chloride: 100 mmol/L (ref 98–111)
Creatinine, Ser: 1.04 mg/dL — ABNORMAL HIGH (ref 0.44–1.00)
GFR calc Af Amer: 57 mL/min — ABNORMAL LOW (ref >60)
GFR calc non Af Amer: 49 mL/min — ABNORMAL LOW (ref >60)
Glucose, Bld: 189 mg/dL — ABNORMAL HIGH (ref 70–99)
Potassium: 3.5 mmol/L (ref 3.5–5.1)
Sodium: 135 mmol/L (ref 135–145)

## 2020-01-25 LAB — VITAMIN D 25 HYDROXY (VIT D DEFICIENCY, FRACTURES): Vit D, 25-Hydroxy: 26.54 ng/mL — ABNORMAL LOW (ref 30–100)

## 2020-01-25 LAB — PHOSPHORUS: Phosphorus: 3.3 mg/dL (ref 2.5–4.6)

## 2020-01-25 LAB — MAGNESIUM: Magnesium: 1.9 mg/dL (ref 1.7–2.4)

## 2020-01-25 SURGERY — REMOVAL, EXTERNAL FIXATION DEVICE, LOWER EXTREMITY
Anesthesia: Regional | Site: Ankle | Laterality: Left

## 2020-01-25 MED ORDER — POTASSIUM CHLORIDE 10 MEQ/100ML IV SOLN
10.0000 meq | INTRAVENOUS | Status: AC
  Administered 2020-01-25 (×4): 10 meq via INTRAVENOUS
  Filled 2020-01-25 (×4): qty 100

## 2020-01-25 MED ORDER — SODIUM CHLORIDE 0.9 % IV SOLN: INTRAVENOUS | Status: DC | PRN

## 2020-01-25 MED ORDER — ESMOLOL HCL 100 MG/10ML IV SOLN
INTRAVENOUS | Status: DC | PRN
  Administered 2020-01-25: 50 mg via INTRAVENOUS
  Administered 2020-01-25: 10 mg via INTRAVENOUS

## 2020-01-25 MED ORDER — ADENOSINE 6 MG/2ML IV SOLN
6.0000 mg | Freq: Once | INTRAVENOUS | Status: AC
  Administered 2020-01-25: 6 mg via INTRAVENOUS
  Filled 2020-01-25: qty 2

## 2020-01-25 MED ORDER — ENOXAPARIN SODIUM 30 MG/0.3ML ~~LOC~~ SOLN: 30.0000 mg | Freq: Two times a day (BID) | SUBCUTANEOUS | Status: DC

## 2020-01-25 MED ORDER — ONDANSETRON HCL 4 MG/2ML IJ SOLN
INTRAMUSCULAR | Status: DC | PRN
  Administered 2020-01-25: 4 mg via INTRAVENOUS

## 2020-01-25 MED ORDER — ESMOLOL HCL 100 MG/10ML IV SOLN
INTRAVENOUS | Status: DC | PRN
  Administered 2020-01-25: 10 mg via INTRAVENOUS

## 2020-01-25 MED ORDER — LACTATED RINGERS IV SOLN: INTRAVENOUS | Status: DC

## 2020-01-25 MED ORDER — INSULIN ASPART 100 UNIT/ML ~~LOC~~ SOLN
0.0000 [IU] | Freq: Three times a day (TID) | SUBCUTANEOUS | Status: DC
  Administered 2020-01-26: 2 [IU] via SUBCUTANEOUS
  Administered 2020-01-26 (×2): 3 [IU] via SUBCUTANEOUS
  Administered 2020-01-27: 8 [IU] via SUBCUTANEOUS
  Administered 2020-01-27: 2 [IU] via SUBCUTANEOUS
  Administered 2020-01-27 – 2020-01-28 (×4): 5 [IU] via SUBCUTANEOUS
  Administered 2020-01-29: 11 [IU] via SUBCUTANEOUS
  Administered 2020-01-29 (×2): 5 [IU] via SUBCUTANEOUS

## 2020-01-25 MED ORDER — POTASSIUM CHLORIDE 20 MEQ/15ML (10%) PO SOLN
20.0000 meq | ORAL | Status: DC
  Filled 2020-01-25: qty 15

## 2020-01-25 MED ORDER — ROPIVACAINE HCL 5 MG/ML IJ SOLN
INTRAMUSCULAR | Status: DC | PRN
  Administered 2020-01-25: 40 mL via PERINEURAL

## 2020-01-25 MED ORDER — DEXAMETHASONE SODIUM PHOSPHATE 10 MG/ML IJ SOLN
INTRAMUSCULAR | Status: AC
  Filled 2020-01-25: qty 1

## 2020-01-25 MED ORDER — FENTANYL CITRATE (PF) 100 MCG/2ML IJ SOLN
25.0000 ug | INTRAMUSCULAR | Status: DC | PRN
  Administered 2020-01-25: 19:00:00 25 ug via INTRAVENOUS

## 2020-01-25 MED ORDER — ADENOSINE 6 MG/2ML IV SOLN: 6.0000 mg | Freq: Once | INTRAVENOUS | Status: DC

## 2020-01-25 MED ORDER — ENOXAPARIN SODIUM 30 MG/0.3ML ~~LOC~~ SOLN: 30.0000 mg | SUBCUTANEOUS | Status: DC

## 2020-01-25 MED ORDER — LIDOCAINE 2% (20 MG/ML) 5 ML SYRINGE
INTRAMUSCULAR | Status: AC
  Filled 2020-01-25: qty 5

## 2020-01-25 MED ORDER — POTASSIUM CHLORIDE 10 MEQ/100ML IV SOLN
INTRAVENOUS | Status: DC | PRN
  Administered 2020-01-25: 10 meq via INTRAVENOUS

## 2020-01-25 MED ORDER — FENTANYL CITRATE (PF) 250 MCG/5ML IJ SOLN
INTRAMUSCULAR | Status: AC
  Filled 2020-01-25: qty 5

## 2020-01-25 MED ORDER — ORAL CARE MOUTH RINSE
15.0000 mL | Freq: Two times a day (BID) | OROMUCOSAL | Status: DC
  Administered 2020-01-26 – 2020-01-29 (×5): 15 mL via OROMUCOSAL

## 2020-01-25 MED ORDER — CLONIDINE HCL (ANALGESIA) 100 MCG/ML EP SOLN
EPIDURAL | Status: DC | PRN
  Administered 2020-01-25: 200 ug

## 2020-01-25 MED ORDER — SODIUM CHLORIDE 0.9 % IR SOLN
Status: DC | PRN
  Administered 2020-01-25: 3000 mL

## 2020-01-25 MED ORDER — LACTATED RINGERS IV SOLN: INTRAVENOUS | Status: DC | PRN

## 2020-01-25 MED ORDER — FENTANYL CITRATE (PF) 100 MCG/2ML IJ SOLN
INTRAMUSCULAR | Status: AC
  Filled 2020-01-25: qty 2

## 2020-01-25 MED ORDER — ACETAMINOPHEN 10 MG/ML IV SOLN: 1000.0000 mg | Freq: Once | INTRAVENOUS | Status: DC | PRN

## 2020-01-25 MED ORDER — PROPOFOL 500 MG/50ML IV EMUL
INTRAVENOUS | Status: DC | PRN
  Administered 2020-01-25: 50 ug/kg/min via INTRAVENOUS

## 2020-01-25 MED ORDER — FENTANYL CITRATE (PF) 100 MCG/2ML IJ SOLN
INTRAMUSCULAR | Status: AC
  Administered 2020-01-25: 16:00:00 100 ug via INTRAVENOUS
  Filled 2020-01-25: qty 2

## 2020-01-25 MED ORDER — METOPROLOL TARTRATE 5 MG/5ML IV SOLN
5.0000 mg | Freq: Four times a day (QID) | INTRAVENOUS | Status: DC | PRN
  Administered 2020-01-25: 5 mg via INTRAVENOUS
  Filled 2020-01-25: qty 5

## 2020-01-25 MED ORDER — PHENYLEPHRINE HCL-NACL 10-0.9 MG/250ML-% IV SOLN
INTRAVENOUS | Status: DC | PRN
  Administered 2020-01-25: 25 ug/min via INTRAVENOUS

## 2020-01-25 MED ORDER — ESMOLOL HCL 100 MG/10ML IV SOLN
INTRAVENOUS | Status: AC
  Filled 2020-01-25: qty 10

## 2020-01-25 MED ORDER — FENTANYL CITRATE (PF) 100 MCG/2ML IJ SOLN: 100.0000 ug | Freq: Once | INTRAMUSCULAR | Status: AC

## 2020-01-25 MED ORDER — ONDANSETRON HCL 4 MG/2ML IJ SOLN: 4.0000 mg | Freq: Once | INTRAMUSCULAR | Status: DC | PRN

## 2020-01-25 MED ORDER — ONDANSETRON HCL 4 MG/2ML IJ SOLN
INTRAMUSCULAR | Status: AC
  Filled 2020-01-25: qty 2

## 2020-01-25 MED ORDER — ROCURONIUM BROMIDE 10 MG/ML (PF) SYRINGE
PREFILLED_SYRINGE | INTRAVENOUS | Status: AC
  Filled 2020-01-25: qty 10

## 2020-01-25 MED ORDER — PROPOFOL 10 MG/ML IV BOLUS
INTRAVENOUS | Status: AC
  Filled 2020-01-25: qty 20

## 2020-01-25 MED ORDER — FENTANYL CITRATE (PF) 100 MCG/2ML IJ SOLN
INTRAMUSCULAR | Status: DC | PRN
  Administered 2020-01-25: 25 ug via INTRAVENOUS

## 2020-01-25 MED ORDER — PHENYLEPHRINE 40 MCG/ML (10ML) SYRINGE FOR IV PUSH (FOR BLOOD PRESSURE SUPPORT)
PREFILLED_SYRINGE | INTRAVENOUS | Status: DC | PRN
  Administered 2020-01-25: 120 ug via INTRAVENOUS

## 2020-01-25 MED ORDER — CEFAZOLIN SODIUM-DEXTROSE 2-4 GM/100ML-% IV SOLN
2.0000 g | Freq: Three times a day (TID) | INTRAVENOUS | Status: AC
  Administered 2020-01-25 – 2020-01-26 (×3): 2 g via INTRAVENOUS
  Filled 2020-01-25 (×3): qty 100

## 2020-01-25 MED ORDER — METOPROLOL TARTRATE 5 MG/5ML IV SOLN
5.0000 mg | INTRAVENOUS | Status: DC
  Filled 2020-01-25: qty 5

## 2020-01-25 MED ORDER — VANCOMYCIN HCL 1000 MG IV SOLR
INTRAVENOUS | Status: AC
  Filled 2020-01-25: qty 1000

## 2020-01-25 MED ORDER — ALBUMIN HUMAN 5 % IV SOLN: INTRAVENOUS | Status: DC | PRN

## 2020-01-25 MED ORDER — 0.9 % SODIUM CHLORIDE (POUR BTL) OPTIME
TOPICAL | Status: DC | PRN
  Administered 2020-01-25: 1000 mL

## 2020-01-25 MED ORDER — ENOXAPARIN SODIUM 30 MG/0.3ML ~~LOC~~ SOLN
30.0000 mg | Freq: Two times a day (BID) | SUBCUTANEOUS | Status: DC
  Filled 2020-01-25: qty 0.3

## 2020-01-25 MED ORDER — VANCOMYCIN HCL 1000 MG IV SOLR
INTRAVENOUS | Status: DC | PRN
  Administered 2020-01-25: 1000 mg via TOPICAL

## 2020-01-25 SURGICAL SUPPLY — 79 items
BANDAGE ESMARK 6X9 LF (GAUZE/BANDAGES/DRESSINGS) ×2 IMPLANT
BIT DRILL QC 2.0 SHORT EVOS SM (DRILL) IMPLANT
BIT DRILL QC 2.5MM SHRT EVO SM (DRILL) IMPLANT
BNDG COHESIVE 4X5 TAN STRL (GAUZE/BANDAGES/DRESSINGS) ×4 IMPLANT
BNDG ELASTIC 4X5.8 VLCR STR LF (GAUZE/BANDAGES/DRESSINGS) ×4 IMPLANT
BNDG ELASTIC 6X5.8 VLCR STR LF (GAUZE/BANDAGES/DRESSINGS) ×4 IMPLANT
BNDG ESMARK 6X9 LF (GAUZE/BANDAGES/DRESSINGS) ×4
BNDG GAUZE ELAST 4 BULKY (GAUZE/BANDAGES/DRESSINGS) ×8 IMPLANT
BRUSH SCRUB EZ PLAIN DRY (MISCELLANEOUS) ×8 IMPLANT
CANISTER WOUND CARE 500ML ATS (WOUND CARE) ×2 IMPLANT
CHLORAPREP W/TINT 26 (MISCELLANEOUS) ×4 IMPLANT
COVER SURGICAL LIGHT HANDLE (MISCELLANEOUS) ×8 IMPLANT
COVER WAND RF STERILE (DRAPES) ×4 IMPLANT
DRAPE C-ARM 42X72 X-RAY (DRAPES) ×4 IMPLANT
DRAPE C-ARMOR (DRAPES) ×4 IMPLANT
DRAPE ORTHO SPLIT 77X108 STRL (DRAPES) ×8
DRAPE SURG ORHT 6 SPLT 77X108 (DRAPES) ×4 IMPLANT
DRAPE U-SHAPE 47X51 STRL (DRAPES) ×4 IMPLANT
DRESSING PREVENA PLUS CUSTOM (GAUZE/BANDAGES/DRESSINGS) IMPLANT
DRILL QC 2.0 SHORT EVOS SM (DRILL) ×4
DRILL QC 2.5MM SHORT EVOS SM (DRILL) ×4
DRSG ADAPTIC 3X8 NADH LF (GAUZE/BANDAGES/DRESSINGS) ×4 IMPLANT
DRSG MEPITEL 4X7.2 (GAUZE/BANDAGES/DRESSINGS) ×2 IMPLANT
DRSG PREVENA PLUS CUSTOM (GAUZE/BANDAGES/DRESSINGS) ×4
ELECT REM PT RETURN 9FT ADLT (ELECTROSURGICAL) ×4
ELECTRODE REM PT RTRN 9FT ADLT (ELECTROSURGICAL) ×2 IMPLANT
GAUZE SPONGE 4X4 12PLY STRL (GAUZE/BANDAGES/DRESSINGS) ×4 IMPLANT
GLOVE BIO SURGEON STRL SZ 6.5 (GLOVE) ×9 IMPLANT
GLOVE BIO SURGEON STRL SZ7.5 (GLOVE) ×16 IMPLANT
GLOVE BIO SURGEONS STRL SZ 6.5 (GLOVE) ×3
GLOVE BIOGEL PI IND STRL 6.5 (GLOVE) ×2 IMPLANT
GLOVE BIOGEL PI IND STRL 7.5 (GLOVE) ×2 IMPLANT
GLOVE BIOGEL PI INDICATOR 6.5 (GLOVE) ×2
GLOVE BIOGEL PI INDICATOR 7.5 (GLOVE) ×2
GOWN STRL REUS W/ TWL LRG LVL3 (GOWN DISPOSABLE) ×4 IMPLANT
GOWN STRL REUS W/TWL LRG LVL3 (GOWN DISPOSABLE) ×8
K-WIRE 1.6 (WIRE) ×16
K-WIRE FX150X1.6XTROC PNT (WIRE) ×8
KIT BASIN OR (CUSTOM PROCEDURE TRAY) ×4 IMPLANT
KIT TURNOVER KIT B (KITS) ×4 IMPLANT
KWIRE FX150X1.6XTROC PNT (WIRE) IMPLANT
MANIFOLD NEPTUNE II (INSTRUMENTS) ×4 IMPLANT
NDL HYPO 21X1.5 SAFETY (NEEDLE) IMPLANT
NDL HYPO 25GX1X1/2 BEV (NEEDLE) ×2 IMPLANT
NEEDLE HYPO 21X1.5 SAFETY (NEEDLE) IMPLANT
NEEDLE HYPO 25GX1X1/2 BEV (NEEDLE) ×4 IMPLANT
NS IRRIG 1000ML POUR BTL (IV SOLUTION) ×4 IMPLANT
PACK TOTAL JOINT (CUSTOM PROCEDURE TRAY) ×4 IMPLANT
PAD ARMBOARD 7.5X6 YLW CONV (MISCELLANEOUS) ×8 IMPLANT
PAD CAST 4YDX4 CTTN HI CHSV (CAST SUPPLIES) IMPLANT
PADDING CAST COTTON 4X4 STRL (CAST SUPPLIES) ×4
PADDING CAST COTTON 6X4 STRL (CAST SUPPLIES) ×12 IMPLANT
PLATE FIB EVOS 9H L 2.7X137 (Plate) ×2 IMPLANT
SCREW CORT 2.7X16 STAR T8 EVOS (Screw) ×2 IMPLANT
SCREW CORT 2.7X18 T8 ST EVOS (Screw) ×2 IMPLANT
SCREW CORT 2.7X19 ST STAR EVOS (Screw) ×2 IMPLANT
SCREW CORT 3.5X13MM (Screw) ×2 IMPLANT
SCREW CORT EVOS ST 3.5X12 (Screw) ×4 IMPLANT
SCREW CORT ST EVOS 3.5X60 (Screw) ×4 IMPLANT
SCREW LOCK CORTICAL 3.5X42 (Screw) ×2 IMPLANT
SCREW LOCK ST EVOS 2.7X20 (Screw) ×2 IMPLANT
SET INTERPULSE LAVAGE W/TIP (ORTHOPEDIC DISPOSABLE SUPPLIES) ×2 IMPLANT
SPONGE LAP 18X18 RF (DISPOSABLE) ×4 IMPLANT
STAPLER VISISTAT 35W (STAPLE) ×4 IMPLANT
SUCTION FRAZIER HANDLE 10FR (MISCELLANEOUS) ×4
SUCTION TUBE FRAZIER 10FR DISP (MISCELLANEOUS) ×2 IMPLANT
SUT ETHILON 3 0 PS 1 (SUTURE) ×8 IMPLANT
SUT MNCRL AB 3-0 PS2 18 (SUTURE) ×4 IMPLANT
SUT MON AB 2-0 CT1 36 (SUTURE) ×4 IMPLANT
SUT PROLENE 0 CT (SUTURE) IMPLANT
SUT VIC AB 0 CT1 27 (SUTURE) ×4
SUT VIC AB 0 CT1 27XBRD ANBCTR (SUTURE) ×2 IMPLANT
SUT VIC AB 2-0 CT1 27 (SUTURE) ×8
SUT VIC AB 2-0 CT1 TAPERPNT 27 (SUTURE) ×4 IMPLANT
SYR CONTROL 10ML LL (SYRINGE) ×4 IMPLANT
TOWEL GREEN STERILE (TOWEL DISPOSABLE) ×8 IMPLANT
TOWEL GREEN STERILE FF (TOWEL DISPOSABLE) ×8 IMPLANT
UNDERPAD 30X30 (UNDERPADS AND DIAPERS) ×4 IMPLANT
WATER STERILE IRR 1000ML POUR (IV SOLUTION) ×8 IMPLANT
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
## 2020-01-25 NOTE — Progress Notes (Signed)
Report received from Arliss Journey, RN

## 2020-01-25 NOTE — Anesthesia Preprocedure Evaluation (Addendum)
Anesthesia Evaluation  Patient identified by MRN, date of birth, ID band Patient awake    Reviewed: Allergy & Precautions, NPO status , Patient's Chart, lab work & pertinent test results  Airway Mallampati: III  TM Distance: >3 FB Neck ROM: Full    Dental  (+) Missing   Pulmonary neg pulmonary ROS,    Pulmonary exam normal breath sounds clear to auscultation       Cardiovascular hypertension, Pt. on home beta blockers Normal cardiovascular exam Rhythm:Regular Rate:Normal  ECG: NSR, rate 91  R grade 1 and L grade 4 BCVI B vertebral arteries  Transient episode of SVT this am in ICU   Neuro/Psych PSYCHIATRIC DISORDERS Anxiety Depression Type 3 odontoid FX  C-spine not cleared    GI/Hepatic negative GI ROS, Neg liver ROS,   Endo/Other  diabetes, Oral Hypoglycemic AgentsHypothyroidism   Renal/GU negative Renal ROS     Musculoskeletal  (+) Arthritis , R rib FX 2,3,6   Abdominal   Peds  Hematology  (+) anemia , HLD   Anesthesia Other Findings open left ankle fx  Reproductive/Obstetrics                          Anesthesia Physical Anesthesia Plan  ASA: III  Anesthesia Plan: Regional   Post-op Pain Management:    Induction: Intravenous  PONV Risk Score and Plan: 2 and Ondansetron, Dexamethasone, Propofol infusion and Treatment may vary due to age or medical condition  Airway Management Planned: Simple Face Mask  Additional Equipment:   Intra-op Plan:   Post-operative Plan:   Informed Consent: I have reviewed the patients History and Physical, chart, labs and discussed the procedure including the risks, benefits and alternatives for the proposed anesthesia with the patient or authorized representative who has indicated his/her understanding and acceptance.     Dental advisory given  Plan Discussed with: CRNA  Anesthesia Plan Comments:         Anesthesia Quick  Evaluation

## 2020-01-25 NOTE — Progress Notes (Signed)
Patient's daughter Teryl Lucy called & updated. Home med list checked & verified with pt's daughter. Pt takes Verapamil 120mg  twice daily but has not been on medication since hospitalization. BP stable. Dr. notified. No new orders at this time.

## 2020-01-25 NOTE — Progress Notes (Signed)
Patient ID: [name], female   DOB: 02/01/1933, 86 y.o.   MRN: [id number] I saw the patient today.  She was using her phone well and was talking to her daughter on the phone.  I got to speak to her daughter and explained her injury and our treatment options.  The patient looks good and only complains of some neck soreness.  Her collar is on appropriately.  She is using her hands well.  For now we will continue to manage this conservatively in the collar and hope for healing.
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
## 2020-01-25 NOTE — Anesthesia Procedure Notes (Addendum)
Anesthesia Regional Block: Popliteal block   Pre-Anesthetic Checklist: ,, timeout performed, Correct Patient, Correct Site, Correct Laterality, Correct Procedure,, site marked, risks and benefits discussed, Surgical consent,  Pre-op evaluation,  At surgeon's request and post-op pain management  Laterality: Left  Prep: chloraprep       Needles:  Injection technique: Single-shot  Needle Type: Echogenic Stimulator Needle     Needle Length: 9cm  Needle Gauge: 21     Additional Needles:   Procedures:,,,, ultrasound used (permanent image in chart),,,,  Narrative:  Start time: 02/06/2020 3:50 PM End time: 01/22/2020 4:00 PM Injection made incrementally with aspirations every 5 mL.  Performed by: Personally  Anesthesiologist: Leonides Grills, MD  Additional Notes: Functioning IV was confirmed and monitors were applied.  A 89mm 21ga Arrow echogenic stimulator needle was used. Sterile prep and drape,hand hygiene and sterile gloves were used.  Negative aspiration and negative test dose prior to incremental administration of local anesthetic. The patient tolerated the procedure well.

## 2020-01-25 NOTE — Progress Notes (Deleted)
Report received from Lenard Lance, RN

## 2020-01-25 NOTE — Progress Notes (Signed)
Inpatient Rehabilitation Admissions Coordinator  Inpatient rehab consult received. I met with patient with her daughter from [facility] at bedside for rehab assessment. We discussed goals and expectations of a possible inpt rehab admit. I will follow her progress to assist with planning rehab venue options.  Danne Baxter, RN, MSN Rehab Admissions Coordinator [phone] 01/11/2020 11:00 AM

## 2020-01-25 NOTE — Anesthesia Postprocedure Evaluation (Signed)
Anesthesia Post Note  Patient: ROMESHA SCHERER  Procedure(s) Performed: REMOVAL EXTERNAL FIXATION LEG (Left ) OPEN REDUCTION INTERNAL FIXATION (ORIF) ANKLE FRACTURE (Left Ankle)     Patient location during evaluation: PACU Anesthesia Type: Regional Level of consciousness: awake and alert Pain management: pain level controlled Vital Signs Assessment: post-procedure vital signs reviewed and stable Respiratory status: spontaneous breathing, nonlabored ventilation, respiratory function stable and patient connected to nasal cannula oxygen Cardiovascular status: stable and blood pressure returned to baseline Postop Assessment: no apparent nausea or vomiting Anesthetic complications: no    Last Vitals:  Vitals:   02/07/2020 2130 02/03/2020 2200  BP:  (!) 116/57  Pulse: 74 72  Resp: 16 (!) 21  Temp:    SpO2: 99% 100%    Last Pain:  Vitals:   01/17/2020 2227  TempSrc:   PainSc: 0-No pain                 Neita Landrigan COKER

## 2020-01-25 NOTE — Progress Notes (Signed)
Chaplain engaged in initial visit with [name] and her daughter, [name].  Chaplain offered support and prayer.  [name] expressed being optimistic about her procedure today.   Chaplain will continue to follow-up.
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
## 2020-01-25 NOTE — Anesthesia Procedure Notes (Signed)
Procedure Name: MAC Date/Time: 01/19/2020 4:55 PM Performed by: Alain Marion, CRNA Pre-anesthesia Checklist: Patient identified, Emergency Drugs available, Suction available and Patient being monitored Oxygen Delivery Method: Simple face mask Placement Confirmation: positive ETCO2

## 2020-01-25 NOTE — Transfer of Care (Signed)
Immediate Anesthesia Transfer of Care Note  Patient: [name]   Procedure(s) Performed: REMOVAL EXTERNAL FIXATION LEG (Left ) OPEN REDUCTION INTERNAL FIXATION (ORIF) ANKLE FRACTURE (Left Ankle)  Patient Location: PACU  Anesthesia Type:MAC combined with regional for post-op pain  Level of Consciousness: awake, alert  and oriented  Airway & Oxygen Therapy: Patient Spontanous Breathing and Patient connected to nasal cannula oxygen  Post-op Assessment: Report given to RN and Post -op Vital signs reviewed and stable  Post vital signs: Reviewed and stable  Last Vitals:  Vitals Value Taken Time  BP 110/55 01/11/2020 1852  Temp    Pulse 79 01/20/2020 1856  Resp 19 02/02/2020 1856  SpO2 92 % 01/15/2020 1856  Vitals shown include unvalidated device data.  Last Pain:  Vitals:   02/02/2020 1430  TempSrc:   PainSc: 7       Patients Stated Pain Goal: 2 (66/29/47 6546)  Complications: No apparent anesthesia complications
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
## 2020-01-25 NOTE — Progress Notes (Signed)
PT Cancellation Note  Patient Details Name: [name] MRN: [id number] DOB: [birth year]   Cancelled Treatment:    Reason Eval/Treat Not Completed: Patient at procedure or test/unavailable.  Will f/u as able. Royetta Asal, PT Acute Rehab Services Pager [phone] [facility] [phone] [facility] [phone]    Rayetta Humphrey 02/20/2020, 4:09 PM

## 2020-01-25 NOTE — Anesthesia Procedure Notes (Addendum)
Anesthesia Regional Block: Adductor canal block   Pre-Anesthetic Checklist: ,, timeout performed, Correct Patient, Correct Site, Correct Laterality, Correct Procedure,, site marked, risks and benefits discussed, Surgical consent,  Pre-op evaluation,  At surgeon's request and post-op pain management  Laterality: Left  Prep: chloraprep       Needles:  Injection technique: Single-shot  Needle Type: Echogenic Stimulator Needle     Needle Length: 9cm  Needle Gauge: 21     Additional Needles:   Procedures:,,,, ultrasound used (permanent image in chart),,,,  Narrative:  Start time: 01/12/2020 4:00 PM End time: 01/26/2020 4:10 PM Injection made incrementally with aspirations every 5 mL.  Performed by: Personally  Anesthesiologist: Leonides Grills, MD  Additional Notes: Functioning IV was confirmed and monitors were applied. A time-out was performed. Hand hygiene and sterile gloves were used. The thigh was placed in a frog-leg position and prepped in a sterile fashion. A 22mm 21ga Arrow echogenic stimulator needle was placed using ultrasound guidance.  Negative aspiration and negative test dose prior to incremental administration of local anesthetic. The patient tolerated the procedure well.

## 2020-01-26 ENCOUNTER — Other Ambulatory Visit: Payer: Self-pay | Admitting: Internal Medicine

## 2020-01-26 ENCOUNTER — Inpatient Hospital Stay (HOSPITAL_COMMUNITY): Payer: No Typology Code available for payment source

## 2020-01-26 DIAGNOSIS — E119 Type 2 diabetes mellitus without complications: Secondary | ICD-10-CM

## 2020-01-26 DIAGNOSIS — I10 Essential (primary) hypertension: Secondary | ICD-10-CM

## 2020-01-26 LAB — CBC
HCT: 25.1 % — ABNORMAL LOW (ref 36.0–46.0)
Hemoglobin: 7.9 g/dL — ABNORMAL LOW (ref 12.0–15.0)
MCH: 30.5 pg (ref 26.0–34.0)
MCHC: 31.5 g/dL (ref 30.0–36.0)
MCV: 96.9 fL (ref 80.0–100.0)
Platelets: 213 10*3/uL (ref 150–400)
RBC: 2.59 MIL/uL — ABNORMAL LOW (ref 3.87–5.11)
RDW: 13.1 % (ref 11.5–15.5)
WBC: 10.3 10*3/uL (ref 4.0–10.5)
nRBC: 0 % (ref 0.0–0.2)

## 2020-01-26 LAB — GLUCOSE, CAPILLARY
Glucose-Capillary: 161 mg/dL — ABNORMAL HIGH (ref 70–99)
Glucose-Capillary: 148 mg/dL — ABNORMAL HIGH (ref 70–99)
Glucose-Capillary: 158 mg/dL — ABNORMAL HIGH (ref 70–99)
Glucose-Capillary: 169 mg/dL — ABNORMAL HIGH (ref 70–99)

## 2020-01-26 MED ORDER — VITAMIN D 25 MCG (1000 UNIT) PO TABS
2000.0000 [IU] | ORAL_TABLET | Freq: Two times a day (BID) | ORAL | Status: DC
  Administered 2020-01-26 – 2020-01-29 (×7): 2000 [IU] via ORAL
  Filled 2020-01-26 (×7): qty 2

## 2020-01-26 MED ORDER — LEVOTHYROXINE SODIUM 100 MCG PO TABS
100.0000 ug | ORAL_TABLET | Freq: Every day | ORAL | Status: DC
  Administered 2020-01-26 – 2020-01-29 (×4): 100 ug via ORAL
  Filled 2020-01-26 (×4): qty 1

## 2020-01-26 MED ORDER — MORPHINE SULFATE (PF) 2 MG/ML IV SOLN
2.0000 mg | INTRAVENOUS | Status: DC | PRN
  Administered 2020-01-28 – 2020-01-29 (×4): 2 mg via INTRAVENOUS
  Filled 2020-01-26 (×4): qty 1

## 2020-01-26 MED ORDER — OXYCODONE HCL 5 MG PO TABS
5.0000 mg | ORAL_TABLET | ORAL | Status: DC | PRN
  Administered 2020-01-26 – 2020-01-28 (×7): 10 mg via ORAL
  Administered 2020-01-28 – 2020-01-29 (×2): 5 mg via ORAL
  Filled 2020-01-26: qty 1
  Filled 2020-01-26 (×4): qty 2
  Filled 2020-01-26: qty 1
  Filled 2020-01-26 (×3): qty 2

## 2020-01-26 MED ORDER — METOPROLOL TARTRATE 25 MG PO TABS: 25.0000 mg | ORAL_TABLET | Freq: Two times a day (BID) | ORAL | Status: DC

## 2020-01-26 MED ORDER — PROPRANOLOL HCL ER 60 MG PO CP24
120.0000 mg | ORAL_CAPSULE | Freq: Every day | ORAL | Status: DC
  Administered 2020-01-26 – 2020-01-29 (×4): 120 mg via ORAL
  Filled 2020-01-26 (×4): qty 2
  Filled 2020-01-26: qty 1

## 2020-01-26 MED ORDER — ENOXAPARIN SODIUM 40 MG/0.4ML ~~LOC~~ SOLN
40.0000 mg | SUBCUTANEOUS | Status: DC
  Administered 2020-01-26 – 2020-01-28 (×3): 40 mg via SUBCUTANEOUS
  Filled 2020-01-26 (×3): qty 0.4

## 2020-01-26 NOTE — Progress Notes (Signed)
Pt due to void but unable to. Bladder scan at 0000 showed in bladder. Pt not complaining of bladder fullness. Bladder scanned again at 0400 & in bladder. In & out cath performed along with Roanna Raider, RN. UOP collected.   Pasty Arch, RN
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
## 2020-01-26 NOTE — Discharge Instructions (Signed)
Orthopaedic Trauma Service Discharge Instructions   General Discharge Instructions  WEIGHT BEARING STATUS: Non-weightbearing left lower extremity  RANGE OF MOTION/ACTIVITY: Wear boot at all times, except when showering. Wear boot when sleeping.   Wound Care:  Incisions can be left open to air if there is no drainage. If incision continues to have drainage, follow wound care instructions below. Okay to shower if no drainage from incisions.  DVT/PE prophylaxis: Lovenox x 30 days  Diet: as you were eating previously.  Can use over the counter stool softeners and bowel preparations, such as Miralax, to help with bowel movements.  Narcotics can be constipating.  Be sure to drink plenty of fluids  PAIN MEDICATION USE AND EXPECTATIONS  You have likely been given narcotic medications to help control your pain.  After a traumatic event that results in an fracture (broken bone) with or without surgery, it is ok to use narcotic pain medications to help control one's pain.  We understand that everyone responds to pain differently and each individual patient will be evaluated on a regular basis for the continued need for narcotic medications. Ideally, narcotic medication use should last no more than 6-8 weeks (coinciding with fracture healing).   As a patient it is your responsibility as well to monitor narcotic medication use and report the amount and frequency you use these medications when you come to your office visit.   We would also advise that if you are using narcotic medications, you should take a dose prior to therapy to maximize you participation.  IF YOU ARE ON NARCOTIC MEDICATIONS IT IS NOT PERMISSIBLE TO OPERATE A MOTOR VEHICLE (MOTORCYCLE/CAR/TRUCK/MOPED) OR HEAVY MACHINERY DO NOT MIX NARCOTICS WITH OTHER CNS (CENTRAL NERVOUS SYSTEM) DEPRESSANTS SUCH AS ALCOHOL   STOP SMOKING OR USING NICOTINE PRODUCTS!!!!  As discussed nicotine severely impairs your body's ability to heal surgical and  traumatic wounds but also impairs bone healing.  Wounds and bone heal by forming microscopic blood vessels (angiogenesis) and nicotine is a vasoconstrictor (essentially, shrinks blood vessels).  Therefore, if vasoconstriction occurs to these microscopic blood vessels they essentially disappear and are unable to deliver necessary nutrients to the healing tissue.  This is one modifiable factor that you can do to dramatically increase your chances of healing your injury.    (This means no smoking, no nicotine gum, patches, etc)  DO NOT USE NONSTEROIDAL ANTI-INFLAMMATORY DRUGS (NSAID'S)  Using products such as Advil (ibuprofen), Aleve (naproxen), Motrin (ibuprofen) for additional pain control during fracture healing can delay and/or prevent the healing response.  If you would like to take over the counter (OTC) medication, Tylenol (acetaminophen) is ok.  However, some narcotic medications that are given for pain control contain acetaminophen as well. Therefore, you should not exceed more than 4000 mg of tylenol in a day if you do not have liver disease.  Also note that there are may OTC medicines, such as cold medicines and allergy medicines that my contain tylenol as well.  If you have any questions about medications and/or interactions please ask your doctor/PA or your pharmacist.      ICE AND ELEVATE INJURED/OPERATIVE EXTREMITY  Using ice and elevating the injured extremity above your heart can help with swelling and pain control.  Icing in a pulsatile fashion, such as 20 minutes on and 20 minutes off, can be followed.    Do not place ice directly on skin. Make sure there is a barrier between to skin and the ice pack.    Using  frozen items such as frozen peas works well as the conform nicely to the are that needs to be iced.  USE AN ACE WRAP OR TED HOSE FOR SWELLING CONTROL  In addition to icing and elevation, Ace wraps or TED hose are used to help limit and resolve swelling.  It is recommended to use  Ace wraps or TED hose until you are informed to stop.    When using Ace Wraps start the wrapping distally (farthest away from the body) and wrap proximally (closer to the body)   Example: If you had surgery on your leg or thing and you do not have a splint on, start the ace wrap at the toes and work your way up to the thigh        If you had surgery on your upper extremity and do not have a splint on, start the ace wrap at your fingers and work your way up to the upper arm   IF YOU ARE IN A CAM BOOT (BLACK BOOT)  You may remove boot periodically. Perform daily dressing changes as noted below.  Wash the liner of the boot regularly and wear a sock when wearing the boot. It is recommended that you sleep in the boot until told otherwise   CALL THE OFFICE WITH ANY QUESTIONS OR CONCERNS: [phone]   VISIT OUR WEBSITE FOR ADDITIONAL INFORMATION: [email or web address]      Discharge Wound Care Instructions  Do NOT apply any ointments, solutions or lotions to pin sites or surgical wounds.  These prevent needed drainage and even though solutions like hydrogen peroxide kill bacteria, they also damage cells lining the pin sites that help fight infection.  Applying lotions or ointments can keep the wounds moist and can cause them to breakdown and open up as well. This can increase the risk for infection. When in doubt call the office.  Surgical incisions should be dressed daily.  If any drainage is noted, use one layer of adaptic, then gauze, Kerlix, and an ace wrap.  Once the incision is completely dry and without drainage, it may be left open to air out.  Showering may begin 36-48 hours later.  Cleaning gently with soap and water.  Traumatic wounds should be dressed daily as well.    One layer of adaptic, gauze, Kerlix, then ace wrap.  The adaptic can be discontinued once the draining has ceased    If you have a wet to dry dressing: wet the gauze with saline the squeeze as much saline out so the  gauze is moist (not soaking wet), place moistened gauze over wound, then place a dry gauze over the moist one, followed by Kerlix wrap, then ace wrap.
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
## 2020-01-26 NOTE — Progress Notes (Signed)
Orthopedic Tech Progress Note Patient Details:  [name] [birth year] [id number] Also called in a [name] for patient through [employer]. Patient ASPEN [name] is a little big on her neck/face, had a conversation with RN about getting a smaller fitting one and she was very understanding about my concerns so I ordered one. Ortho Devices Type of Ortho Device: CAM walker Ortho Device/Splint Location: LLE Ortho Device/Splint Interventions: Ordered, Application   Post Interventions Patient Tolerated: Well Instructions Provided: Care of device, Adjustment of device   Donald Pore 01/26/2020, 11:09 AM

## 2020-01-27 DIAGNOSIS — S2249XA Multiple fractures of ribs, unspecified side, initial encounter for closed fracture: Secondary | ICD-10-CM

## 2020-01-27 DIAGNOSIS — S15109A Unspecified injury of unspecified vertebral artery, initial encounter: Secondary | ICD-10-CM

## 2020-01-27 LAB — CBC
HCT: 24.9 % — ABNORMAL LOW (ref 36.0–46.0)
Hemoglobin: 7.9 g/dL — ABNORMAL LOW (ref 12.0–15.0)
MCH: 30.5 pg (ref 26.0–34.0)
MCHC: 31.7 g/dL (ref 30.0–36.0)
MCV: 96.1 fL (ref 80.0–100.0)
Platelets: 264 10*3/uL (ref 150–400)
RBC: 2.59 MIL/uL — ABNORMAL LOW (ref 3.87–5.11)
RDW: 13.2 % (ref 11.5–15.5)
WBC: 11 10*3/uL — ABNORMAL HIGH (ref 4.0–10.5)
nRBC: 0 % (ref 0.0–0.2)

## 2020-01-27 LAB — BASIC METABOLIC PANEL
Anion gap: 10 (ref 5–15)
BUN: 16 mg/dL (ref 8–23)
CO2: 25 mmol/L (ref 22–32)
Calcium: 8.1 mg/dL — ABNORMAL LOW (ref 8.9–10.3)
Chloride: 101 mmol/L (ref 98–111)
Creatinine, Ser: 0.88 mg/dL (ref 0.44–1.00)
GFR calc Af Amer: >60 mL/min (ref >60)
GFR calc non Af Amer: 60 mL/min — ABNORMAL LOW (ref >60)
Glucose, Bld: 167 mg/dL — ABNORMAL HIGH (ref 70–99)
Potassium: 4.4 mmol/L (ref 3.5–5.1)
Sodium: 136 mmol/L (ref 135–145)

## 2020-01-27 LAB — GLUCOSE, CAPILLARY
Glucose-Capillary: 140 mg/dL — ABNORMAL HIGH (ref 70–99)
Glucose-Capillary: 295 mg/dL — ABNORMAL HIGH (ref 70–99)
Glucose-Capillary: 225 mg/dL — ABNORMAL HIGH (ref 70–99)
Glucose-Capillary: 199 mg/dL — ABNORMAL HIGH (ref 70–99)

## 2020-01-27 MED ORDER — ENSURE ENLIVE PO LIQD
1.0000 | Freq: Three times a day (TID) | ORAL | Status: DC
  Administered 2020-01-27 – 2020-01-28 (×4): 237 mL via ORAL

## 2020-01-27 MED ORDER — MINOXIDIL 2.5 MG PO TABS
5.0000 mg | ORAL_TABLET | Freq: Every day | ORAL | Status: DC
  Administered 2020-01-27 – 2020-01-28 (×2): 5 mg via ORAL
  Filled 2020-01-27 (×3): qty 2
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
## 2020-01-27 NOTE — Progress Notes (Signed)
Chaplain engaged in follow-up visit with [name] and her daughter Teryl Lucy.  Chaplain expressed support and affirmed Ms. Caileen's progress.    Chaplain will continue to follow-up.

## 2020-01-27 NOTE — Progress Notes (Signed)
NEUROSURGERY PROGRESS NOTE Doing ok, complains mostly of neck pain. She denies any pain NT or weakness in her arms. Continue aspen collar. No new neurosurgical recom.  Temp:  [97.6 F (36.4 C)-98.3 F (36.8 C)] 97.6 F (36.4 C) (03/19 0000) Pulse Rate:  [59-89] 73 (03/19 0600) Resp:  [11-25] 12 (03/19 0600) BP: (92-170)/(50-81) 126/65 (03/19 0600) SpO2:  [91 %-100 %] 99 % (03/19 0600)    Sherryl Manges, NP 01/27/2020 6:56 AM
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
## 2020-01-27 NOTE — Plan of Care (Signed)

## 2020-01-28 ENCOUNTER — Inpatient Hospital Stay (HOSPITAL_COMMUNITY): Payer: No Typology Code available for payment source

## 2020-01-28 LAB — CBC
HCT: 23.6 % — ABNORMAL LOW (ref 36.0–46.0)
Hemoglobin: 7.4 g/dL — ABNORMAL LOW (ref 12.0–15.0)
MCH: 30.8 pg (ref 26.0–34.0)
MCHC: 31.4 g/dL (ref 30.0–36.0)
MCV: 98.3 fL (ref 80.0–100.0)
Platelets: 437 10*3/uL — ABNORMAL HIGH (ref 150–400)
RBC: 2.4 MIL/uL — ABNORMAL LOW (ref 3.87–5.11)
RDW: 13.5 % (ref 11.5–15.5)
WBC: 16 10*3/uL — ABNORMAL HIGH (ref 4.0–10.5)
nRBC: 0.9 % — ABNORMAL HIGH (ref 0.0–0.2)

## 2020-01-28 LAB — PREPARE RBC (CROSSMATCH)

## 2020-01-28 LAB — BASIC METABOLIC PANEL
Anion gap: 13 (ref 5–15)
BUN: 28 mg/dL — ABNORMAL HIGH (ref 8–23)
CO2: 25 mmol/L (ref 22–32)
Calcium: 8.5 mg/dL — ABNORMAL LOW (ref 8.9–10.3)
Chloride: 100 mmol/L (ref 98–111)
Creatinine, Ser: 0.9 mg/dL (ref 0.44–1.00)
GFR calc Af Amer: >60 mL/min (ref >60)
GFR calc non Af Amer: 58 mL/min — ABNORMAL LOW (ref >60)
Glucose, Bld: 289 mg/dL — ABNORMAL HIGH (ref 70–99)
Potassium: 5.5 mmol/L — ABNORMAL HIGH (ref 3.5–5.1)
Sodium: 138 mmol/L (ref 135–145)

## 2020-01-28 LAB — GLUCOSE, CAPILLARY
Glucose-Capillary: 229 mg/dL — ABNORMAL HIGH (ref 70–99)
Glucose-Capillary: 230 mg/dL — ABNORMAL HIGH (ref 70–99)
Glucose-Capillary: 194 mg/dL — ABNORMAL HIGH (ref 70–99)
Glucose-Capillary: 216 mg/dL — ABNORMAL HIGH (ref 70–99)

## 2020-01-28 LAB — LACTIC ACID, PLASMA: Lactic Acid, Venous: 6.4 mmol/L (ref 0.5–1.9)

## 2020-01-28 MED ORDER — IOHEXOL 300 MG/ML  SOLN
100.0000 mL | Freq: Once | INTRAMUSCULAR | Status: AC | PRN
  Administered 2020-01-28: 100 mL via INTRAVENOUS

## 2020-01-28 MED ORDER — BETHANECHOL CHLORIDE 10 MG PO TABS
10.0000 mg | ORAL_TABLET | Freq: Three times a day (TID) | ORAL | Status: DC
  Administered 2020-01-28 (×3): 10 mg via ORAL
  Filled 2020-01-28 (×4): qty 1

## 2020-01-28 MED ORDER — POLYETHYLENE GLYCOL 3350 17 G PO PACK
17.0000 g | PACK | Freq: Every day | ORAL | Status: DC
  Administered 2020-01-28: 17 g via ORAL
  Filled 2020-01-28: qty 1

## 2020-01-28 MED ORDER — SODIUM CHLORIDE 0.9% IV SOLUTION: Freq: Once | INTRAVENOUS | Status: AC

## 2020-01-28 MED ORDER — SODIUM CHLORIDE 0.9 % IV BOLUS
500.0000 mL | Freq: Once | INTRAVENOUS | Status: AC
  Administered 2020-01-28: 500 mL via INTRAVENOUS

## 2020-01-28 MED ORDER — BISACODYL 10 MG RE SUPP: 10.0000 mg | Freq: Every day | RECTAL | Status: DC | PRN

## 2020-01-28 NOTE — Progress Notes (Signed)
Pt called to use the bedpan, on positioning her , c/o nausea. vommitted x1 brown substance, had chocolate ensure earlier , no coffee grounds noted , v/s recorded pt also states  She has light headedness with pressure , Dr Sheliah Hatch called and informed, orders given for 0.9 NACl 500 mls bolus.  Same commenced, Pt had a small BM dark bron stool noted withsmall amount of bright red blood, no clots noted , however pt appeared pale ,skin cold spo2-100%, appears ill-looking collar in place. Dr Sheliah Hatch called and informed, verbal orders given for labs and xray , same in progress, Pts son is present and was updated , close observation is in progress.
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
## 2020-01-28 NOTE — Progress Notes (Signed)
NEUROSURGERY PROGRESS NOTE  Doing ok. Looks like shes in a little more pain today than previous. Still moves upper extremities well and does not complain of any arm pain NT  Temp:  [97.5 F (36.4 C)-99.1 F (37.3 C)] 99.1 F (37.3 C) (03/20 0732) Pulse Rate:  [65-73] 73 (03/20 0732) Resp:  [10-29] 14 (03/20 0732) BP: (113-161)/(62-76) 161/73 (03/20 0732) SpO2:  [88 %-100 %] 100 % (03/20 0732)   Sherryl Manges, NP 01/28/2020 8:59 AM

## 2020-01-28 NOTE — Progress Notes (Signed)
Paged about vomiting concerning for blood and 3 bowel movements with some blood. Patient had worsened intermittent tachypnea. Examined patient. She has tachypnea, saturating well with nasal canula, more pale than earlier. Talking and answering questions appropriately. Hemoglobin 7.4 from 7.9 earlier. BP 90s or 80s systolic down from 100s and 110s. WBC 16 from 11 XR unchanged -type and cross, give 1 unit -stop lovenox and aspirin for bleeding issue -recheck CBC in am
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
## 2020-01-28 NOTE — Plan of Care (Signed)
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
## 2020-01-28 NOTE — Progress Notes (Signed)
CSW met with patient to engage in SBIRT per trauma protocol. Patient scored a total of 0 on their SBIRT exam. Patient has no reported history of substance use.    Sheralyn Boatman [facility] Care Management [phone]

## 2020-01-29 ENCOUNTER — Encounter (HOSPITAL_COMMUNITY): Payer: Medicare Other

## 2020-01-29 ENCOUNTER — Inpatient Hospital Stay (HOSPITAL_COMMUNITY): Payer: No Typology Code available for payment source

## 2020-01-29 LAB — CBC
HCT: 32.2 % — ABNORMAL LOW (ref 36.0–46.0)
HCT: 29.3 % — ABNORMAL LOW (ref 36.0–46.0)
Hemoglobin: 10.6 g/dL — ABNORMAL LOW (ref 12.0–15.0)
Hemoglobin: 9.6 g/dL — ABNORMAL LOW (ref 12.0–15.0)
MCH: 30.4 pg (ref 26.0–34.0)
MCH: 30.2 pg (ref 26.0–34.0)
MCHC: 32.9 g/dL (ref 30.0–36.0)
MCHC: 32.8 g/dL (ref 30.0–36.0)
MCV: 92.3 fL (ref 80.0–100.0)
MCV: 92.1 fL (ref 80.0–100.0)
Platelets: 343 10*3/uL (ref 150–400)
Platelets: 301 10*3/uL (ref 150–400)
RBC: 3.49 MIL/uL — ABNORMAL LOW (ref 3.87–5.11)
RBC: 3.18 MIL/uL — ABNORMAL LOW (ref 3.87–5.11)
RDW: 14.8 % (ref 11.5–15.5)
RDW: 14.7 % (ref 11.5–15.5)
WBC: 18 10*3/uL — ABNORMAL HIGH (ref 4.0–10.5)
WBC: 12.8 10*3/uL — ABNORMAL HIGH (ref 4.0–10.5)
nRBC: 1.2 % — ABNORMAL HIGH (ref 0.0–0.2)
nRBC: 1.7 % — ABNORMAL HIGH (ref 0.0–0.2)

## 2020-01-29 LAB — URINALYSIS, ROUTINE W REFLEX MICROSCOPIC
Bacteria, UA: NONE SEEN
Bilirubin Urine: NEGATIVE
Glucose, UA: NEGATIVE mg/dL
Hgb urine dipstick: NEGATIVE
Ketones, ur: NEGATIVE mg/dL
Nitrite: NEGATIVE
Protein, ur: NEGATIVE mg/dL
Specific Gravity, Urine: 1.045 — ABNORMAL HIGH (ref 1.005–1.030)
pH: 5 (ref 5.0–8.0)

## 2020-01-29 LAB — BLOOD GAS, ARTERIAL
Acid-Base Excess: 1.7 mmol/L (ref 0.0–2.0)
Bicarbonate: 26.5 mmol/L (ref 20.0–28.0)
Drawn by: 53309
FIO2: 28
O2 Saturation: 97.2 %
Patient temperature: 37.3
pCO2 arterial: 48.4 mmHg — ABNORMAL HIGH (ref 32.0–48.0)
pH, Arterial: 7.36 (ref 7.350–7.450)
pO2, Arterial: 93.2 mmHg (ref 83.0–108.0)

## 2020-01-29 LAB — GLUCOSE, CAPILLARY
Glucose-Capillary: 316 mg/dL — ABNORMAL HIGH (ref 70–99)
Glucose-Capillary: 286 mg/dL — ABNORMAL HIGH (ref 70–99)
Glucose-Capillary: 242 mg/dL — ABNORMAL HIGH (ref 70–99)
Glucose-Capillary: 201 mg/dL — ABNORMAL HIGH (ref 70–99)

## 2020-01-29 LAB — LACTIC ACID, PLASMA
Lactic Acid, Venous: 6.9 mmol/L (ref 0.5–1.9)
Lactic Acid, Venous: 1.7 mmol/L (ref 0.5–1.9)
Lactic Acid, Venous: 2.3 mmol/L (ref 0.5–1.9)

## 2020-01-29 LAB — BASIC METABOLIC PANEL
Anion gap: 14 (ref 5–15)
BUN: 54 mg/dL — ABNORMAL HIGH (ref 8–23)
CO2: 24 mmol/L (ref 22–32)
Calcium: 8.4 mg/dL — ABNORMAL LOW (ref 8.9–10.3)
Chloride: 100 mmol/L (ref 98–111)
Creatinine, Ser: 1.11 mg/dL — ABNORMAL HIGH (ref 0.44–1.00)
GFR calc Af Amer: 52 mL/min — ABNORMAL LOW (ref >60)
GFR calc non Af Amer: 45 mL/min — ABNORMAL LOW (ref >60)
Glucose, Bld: 345 mg/dL — ABNORMAL HIGH (ref 70–99)
Potassium: 5.8 mmol/L — ABNORMAL HIGH (ref 3.5–5.1)
Sodium: 138 mmol/L (ref 135–145)

## 2020-01-29 LAB — LACTATE DEHYDROGENASE: LDH: 594 U/L — ABNORMAL HIGH (ref 98–192)

## 2020-01-29 MED ORDER — INSULIN ASPART 100 UNIT/ML ~~LOC~~ SOLN
5.0000 [IU] | Freq: Once | SUBCUTANEOUS | Status: AC
  Administered 2020-01-29: 10:00:00 5 [IU] via SUBCUTANEOUS

## 2020-01-29 MED ORDER — GLYCOPYRROLATE 0.2 MG/ML IJ SOLN
0.2000 mg | INTRAMUSCULAR | Status: DC | PRN
  Administered 2020-01-29: 0.2 mg via INTRAVENOUS
  Filled 2020-01-29: qty 1

## 2020-01-29 MED ORDER — DOCUSATE SODIUM 100 MG PO CAPS: 100.0000 mg | ORAL_CAPSULE | Freq: Two times a day (BID) | ORAL | Status: DC | PRN

## 2020-01-29 MED ORDER — HALOPERIDOL LACTATE 5 MG/ML IJ SOLN: 0.5000 mg | INTRAMUSCULAR | Status: DC | PRN

## 2020-01-29 MED ORDER — MORPHINE 100MG IN NS 100ML (1MG/ML) PREMIX INFUSION
1.0000 mg/h | INTRAVENOUS | Status: DC
  Administered 2020-01-29: 1 mg/h via INTRAVENOUS
  Filled 2020-01-29: qty 100

## 2020-01-29 MED ORDER — GADOBUTROL 1 MMOL/ML IV SOLN: 6.7000 mL | Freq: Once | INTRAVENOUS | Status: DC | PRN

## 2020-01-29 MED ORDER — FUROSEMIDE 10 MG/ML IJ SOLN
20.0000 mg | Freq: Once | INTRAMUSCULAR | Status: AC
  Administered 2020-01-29: 20 mg via INTRAVENOUS
  Filled 2020-01-29: qty 2

## 2020-01-29 MED ORDER — GLYCOPYRROLATE 0.2 MG/ML IJ SOLN: 0.2000 mg | INTRAMUSCULAR | Status: DC | PRN

## 2020-01-29 MED ORDER — ACETAMINOPHEN 325 MG PO TABS: 650.0000 mg | ORAL_TABLET | Freq: Four times a day (QID) | ORAL | Status: DC | PRN

## 2020-01-29 MED ORDER — HALOPERIDOL 0.5 MG PO TABS
0.5000 mg | ORAL_TABLET | ORAL | Status: DC | PRN
  Filled 2020-01-29: qty 1

## 2020-01-29 MED ORDER — SODIUM CHLORIDE 0.9 % IV SOLN
2.0000 g | Freq: Two times a day (BID) | INTRAVENOUS | Status: DC
  Administered 2020-01-29: 2 g via INTRAVENOUS
  Filled 2020-01-29 (×2): qty 2

## 2020-01-29 MED ORDER — MORPHINE BOLUS VIA INFUSION
1.0000 mg | INTRAVENOUS | Status: DC | PRN
  Administered 2020-01-29: 1 mg via INTRAVENOUS
  Filled 2020-01-29: qty 1

## 2020-01-29 MED ORDER — GLYCOPYRROLATE 1 MG PO TABS: 1.0000 mg | ORAL_TABLET | ORAL | Status: DC | PRN

## 2020-01-29 MED ORDER — LORAZEPAM 2 MG/ML PO CONC: 0.5000 mg | ORAL | Status: DC | PRN

## 2020-01-29 MED ORDER — INSULIN GLARGINE 100 UNIT/ML ~~LOC~~ SOLN
7.0000 [IU] | Freq: Every day | SUBCUTANEOUS | Status: DC
  Administered 2020-01-29: 7 [IU] via SUBCUTANEOUS
  Filled 2020-01-29 (×2): qty 0.07

## 2020-01-29 MED ORDER — ONDANSETRON HCL 4 MG/2ML IJ SOLN: 4.0000 mg | Freq: Four times a day (QID) | INTRAMUSCULAR | Status: DC | PRN

## 2020-01-29 MED ORDER — LACTATED RINGERS IV BOLUS
250.0000 mL | Freq: Once | INTRAVENOUS | Status: AC
  Administered 2020-01-29: 250 mL via INTRAVENOUS

## 2020-01-29 MED ORDER — LORAZEPAM 0.5 MG PO TABS: 0.5000 mg | ORAL_TABLET | ORAL | Status: DC | PRN

## 2020-01-29 MED ORDER — MORPHINE SULFATE (PF) 2 MG/ML IV SOLN: 2.0000 mg | INTRAVENOUS | Status: DC | PRN

## 2020-01-29 MED ORDER — ACETAMINOPHEN 650 MG RE SUPP: 650.0000 mg | Freq: Four times a day (QID) | RECTAL | Status: DC | PRN

## 2020-01-29 MED ORDER — LORAZEPAM 2 MG/ML IJ SOLN
0.5000 mg | INTRAMUSCULAR | Status: DC | PRN
  Administered 2020-01-29: 0.5 mg via INTRAVENOUS
  Filled 2020-01-29: qty 1

## 2020-01-29 MED ORDER — BIOTENE DRY MOUTH MT LIQD: 15.0000 mL | OROMUCOSAL | Status: DC | PRN

## 2020-01-29 MED ORDER — HALOPERIDOL LACTATE 2 MG/ML PO CONC
0.5000 mg | ORAL | Status: DC | PRN
  Administered 2020-01-29: 0.5 mg via SUBLINGUAL
  Filled 2020-01-29 (×2): qty 0.3

## 2020-01-29 MED ORDER — ONDANSETRON 4 MG PO TBDP: 4.0000 mg | ORAL_TABLET | Freq: Four times a day (QID) | ORAL | Status: DC | PRN

## 2020-01-29 MED ORDER — CALCIUM GLUCONATE-NACL 1-0.675 GM/50ML-% IV SOLN
1.0000 g | Freq: Once | INTRAVENOUS | Status: AC
  Administered 2020-01-29: 1000 mg via INTRAVENOUS
  Filled 2020-01-29: qty 50

## 2020-01-29 MED ORDER — MORPHINE SULFATE (PF) 2 MG/ML IV SOLN: 1.0000 mg | INTRAVENOUS | Status: DC | PRN

## 2020-01-29 MED ORDER — LACTATED RINGERS IV BOLUS: 250.0000 mL | Freq: Once | INTRAVENOUS | Status: DC

## 2020-01-29 MED ORDER — POLYVINYL ALCOHOL 1.4 % OP SOLN
1.0000 [drp] | Freq: Four times a day (QID) | OPHTHALMIC | Status: DC | PRN
  Filled 2020-01-29: qty 15

## 2020-01-29 MED ORDER — SODIUM ZIRCONIUM CYCLOSILICATE 10 G PO PACK
10.0000 g | PACK | Freq: Once | ORAL | Status: AC
  Administered 2020-01-29: 10 g via ORAL
  Filled 2020-01-29: qty 1

## 2020-01-29 MED ORDER — IOHEXOL 350 MG/ML SOLN
100.0000 mL | Freq: Once | INTRAVENOUS | Status: AC | PRN
  Administered 2020-01-29: 100 mL via INTRAVENOUS

## 2020-01-30 ENCOUNTER — Encounter: Payer: Self-pay | Admitting: *Deleted

## 2020-01-30 LAB — TYPE AND SCREEN
ABO/RH(D): O POS
Antibody Screen: NEGATIVE
Unit division: 0
Unit division: 0

## 2020-01-30 LAB — BPAM RBC
Blood Product Expiration Date: 202104222359
Blood Product Expiration Date: 202104222359
ISSUE DATE / TIME: 202103210000
ISSUE DATE / TIME: 202103210310
Unit Type and Rh: 5100
Unit Type and Rh: 5100

## 2020-01-30 LAB — URINE CULTURE: Culture: NO GROWTH

## 2020-01-30 NOTE — Progress Notes (Signed)
Spoke with ME decided pt is not an ME case  CDS contacted, pt is not eligible.

## 2020-01-30 NOTE — Progress Notes (Signed)
recieved call from patient placement ME reevaluated, possible ME case, lines removed prior to this after originally determining a patient is not an ME case.

## 2020-02-01 ENCOUNTER — Encounter: Payer: Self-pay | Admitting: *Deleted

## 2020-02-03 LAB — CULTURE, BLOOD (ROUTINE X 2)
Culture: NO GROWTH
Culture: NO GROWTH
Special Requests: ADEQUATE
Special Requests: ADEQUATE

## 2020-02-09 ENCOUNTER — Ambulatory Visit: Payer: Medicare Other | Admitting: Internal Medicine

## 2020-02-09 NOTE — Progress Notes (Signed)
Some improvement in mentation, but worsening respiratory status in the form of tachypnea. Currently at the point where I would recommend intubation, however after reviewing the documentation the son and HC-POA brought in, the patient is DNR. I discussed the documentation with the patient, who confirms this and also discussed with the son as he previously wished for the patient to be full code. At this point, he would like to respect the patient's wishes and we will not escalate care any further. I recommended the family come in urgently and they are on their way. I discussed c-collar removal in the setting of her significant fracture with Dr. Franky Macho of NSGY, who states that if she is sitting upright, he would not recommend c-collar removal, rather transition to a soft collar for improved comfort. End of life order set initiated.  Diamantina Monks, MD General and Trauma Surgery [facility]

## 2020-02-09 NOTE — Progress Notes (Signed)
Pt c/o not being able to breathe, tachypneic, shallow breathing, sonorous at times, Dr. Bedelia Person paged.  Pt c/o chest pain 12-lead obtained, Dr. Bedelia Person paged.

## 2020-02-09 NOTE — Progress Notes (Signed)
Pt left for CT earlier.

## 2020-02-09 NOTE — Progress Notes (Signed)
LA, ABG reviewed. CT chest negative for PE, CTA head/neck reviewed with neuroradiologist and L vert re-canalized with stable R vert and basilar unremarkable. D/w neurointerventionalist as well and recs for MRI brain, which has been ordered. Patient hypotensive and neuro status is slightly worse: responds to questions, albeit slowly and occasionally incorrectly, now no longer oriented to self. Symmetric smile, follows commands, no focal deficits of extremities x4. Abdomen soft, NT. D/w family at bedside indications for escalation of care. Also discussed code status as I am concerned about impending need for intubation. At the time of my previous discussion with them on admission, family requested full code status. HC-POA states he will re-review documentation to determine if her wishes are documented. Small bolus of LR, txf to ICU, and MRI when BP improved. TRN at bedside to help facilitate transfer. Will monitor closely.  Diamantina Monks, MD General and Trauma Surgery [facility] Surgery
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
## 2020-02-09 NOTE — Progress Notes (Addendum)
Pt transferred to Icu 4th floor rm 16.  Pts memory has worsened , was unable to answer promptly to name, DOB as she normally does, dr and trauma nurse in attendance.Report given to ICU nurse.

## 2020-02-09 NOTE — Progress Notes (Signed)
TRN note:  MRI delayed d/t unkn type of coiling pt has, notes found from 2008 and provided to MRI for consideration.   Gladstone Lighter TRN  [phone]

## 2020-02-09 NOTE — Progress Notes (Signed)
NEUROSURGERY PROGRESS NOTE  Doing well. Complains of appropriate neck pain. Look much more comfortable today. Strength intact  Temp:  [97.6 F (36.4 C)-99.2 F (37.3 C)] 99.2 F (37.3 C) (03/21 0720) Pulse Rate:  [70-98] 98 (03/21 0720) Resp:  [19-32] 25 (03/21 0543) BP: (86-136)/(48-82) 125/73 (03/21 0543) SpO2:  [86 %-100 %] 95 % (03/21 0720)  Plan: Continue aspen collar, no new nsgy recom.  Sherryl Manges, NP 01/14/2020 8:29 AM
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
## 2020-02-09 NOTE — Progress Notes (Addendum)
TOD 2352, verified by auscultation with Youlanda Roys, RN

## 2020-02-09 NOTE — Progress Notes (Signed)
Pt returned form ct.inbed resting .daughter and son are present.

## 2020-02-09 NOTE — Progress Notes (Signed)
Notified Dr. Bedelia Person of sbp 80-90s. Order given for 250 LR bolus.

## 2020-02-09 NOTE — Death Summary Note (Signed)
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  26  --  25 25 24   GLUCOSE 333*   < > 189* 102* 167* 289* 345*  BUN 24*   < > 25* 20 16 28* 54*  CREATININE 0.91   < > 1.04* 0.90 0.88 0.90 1.11*  CALCIUM 8.8*  --  8.3*  --  8.1* 8.5* 8.4*  MG  --   --  1.9  --   --   --   --  PHOS  --   --  3.3  --   --   --   --    < > = values in this interval not displayed.   Liver Function Tests: Recent Labs  Lab 01/20/2020 1950  AST 57*  ALT 45*  ALKPHOS 75   BILITOT 0.7  PROT 6.1*  ALBUMIN 3.5   No results for input(s): LIPASE, AMYLASE in the last 168 hours. No results for input(s): AMMONIA in the last 168 hours. CBC: Recent Labs  Lab 01/26/20 0234 01/27/20 0243 01/28/20 1923 01/10/2020 0707 01/28/2020 1412  WBC 10.3 11.0* 16.0* 18.0* 12.8*  HGB 7.9* 7.9* 7.4* 10.6* 9.6*  HCT 25.1* 24.9* 23.6* 32.2* 29.3*  MCV 96.9 96.1 98.3 92.3 92.1  PLT 213 264 437* 343 301   Cardiac Enzymes: No results for input(s): CKTOTAL, CKMB, CKMBINDEX, TROPONINI in the last 168 hours. Sepsis Labs: Recent Labs  Lab 02/03/2020 1950 01/27/20 0243 01/28/20 1923 01/28/20 2021 01/28/20 2324 02/05/2020 0707 01/14/2020 0922 01/11/2020 1142 02/02/2020 1412  WBC  --  11.0* 16.0*  --   --  18.0*  --   --  12.8*  LATICACIDVEN   < >  --   --  6.4* 6.9*  --  1.7 2.3*  --    < > = values in this interval not displayed.    Procedures/Operations  Ex-fix L ankle, ORIF L ankle   Lennie Odor Rondel Episcopo 2020-02-18, 12:29 AM

## 2020-02-09 NOTE — Progress Notes (Signed)
Progress Note  Patient Name: [name] Date of Encounter: 01/31/2020  Primary Cardiologist: Tobias Alexander, MD  Subjective   Patient had some emesis yesterday with concern for GI blood loss per chart review, does not appear short of breath this morning.  No active nausea.  Inpatient Medications    Scheduled Meds: . acetaminophen  1,000 mg Oral Q6H  . bethanechol  10 mg Oral TID  . Chlorhexidine Gluconate Cloth  6 each Topical Daily  . cholecalciferol  2,000 Units Oral BID  . docusate sodium  100 mg Oral BID  . feeding supplement (ENSURE ENLIVE)  1 Bottle Oral TID BM  . gabapentin  300 mg Oral TID  . insulin aspart  0-15 Units Subcutaneous TID WC  . levothyroxine  100 mcg Oral Q0600  . mouth rinse  15 mL Mouth Rinse BID  . minoxidil  5 mg Oral Daily  . polyethylene glycol  17 g Oral Daily  . propranolol ER  120 mg Oral Daily   Continuous Infusions: . lactated ringers 10 mL/hr at 01/10/2020 1613  . methocarbamol (ROBAXIN) IV     PRN Meds: bisacodyl, hydrOXYzine, methocarbamol **OR** methocarbamol (ROBAXIN) IV, metoCLOPramide **OR** metoCLOPramide (REGLAN) injection, metoprolol tartrate, morphine injection, ondansetron **OR** ondansetron (ZOFRAN) IV, oxyCODONE   Vital Signs    Vitals:   02/05/2020 0329 01/28/2020 0449 02/02/2020 0543 01/20/2020 0720  BP: 136/82  125/73   Pulse: 91 94 95 98  Resp: (!) 26 20 (!) 25   Temp: 97.8 F (36.6 C)  98.4 F (36.9 C) 99.2 F (37.3 C)  TempSrc: Oral  Oral Oral  SpO2: 99% 100% 100% 95%  Weight:      Height:        Intake/Output Summary (Last 24 hours) at 02/02/2020 0828 Last data filed at 01/17/2020 0751 Gross per 24 hour  Intake 630 ml  Output 1251 ml  Net -621 ml   Filed Weights   02/08/2020 1551  Weight: 63.5 kg    Telemetry    Sinus rhythm frequent PACs.  Personally reviewed.  Physical Exam   GEN:  Elderly woman, no acute distress.   Neck:  Cervical collar in place. Cardiac: RRR with ectopy, no gallop.    Respiratory: Nonlabored. Clear to auscultation bilaterally. GI: Soft, nontender, bowel sounds present. MS: No edema; status post left ankle fracture repair with internal fixation.  Labs    Chemistry Recent Labs  Lab 02/07/2020 1950 01/27/2020 2005 01/27/20 0243 01/28/20 1923 01/28/2020 0707  NA 135   < > 136 138 138  K 4.0   < > 4.4 5.5* 5.8*  CL 96*   < > 101 100 100  CO2 28   < > 25 25 24   GLUCOSE 333*   < > 167* 289* 345*  BUN 24*   < > 16 28* 54*  CREATININE 0.91   < > 0.88 0.90 1.11*  CALCIUM 8.8*   < > 8.1* 8.5* 8.4*  PROT 6.1*  --   --   --   --   ALBUMIN 3.5  --   --   --   --   AST 57*  --   --   --   --   ALT 45*  --   --   --   --   ALKPHOS 75  --   --   --   --   BILITOT 0.7  --   --   --   --   GFRNONAA 58*   < >  60* 58* 45*  GFRAA >60   < > >60 >60 52*  ANIONGAP 11   < > 10 13 14    < > = values in this interval not displayed.     Hematology Recent Labs  Lab 01/27/20 0243 01/28/20 1923 2020/02/21 0707  WBC 11.0* 16.0* 18.0*  RBC 2.59* 2.40* 3.49*  HGB 7.9* 7.4* 10.6*  HCT 24.9* 23.6* 32.2*  MCV 96.1 98.3 92.3  MCH 30.5 30.8 30.4  MCHC 31.7 31.4 32.9  RDW 13.2 13.5 14.8  PLT 264 437* 343    Radiology    DG Abd 1 View  Result Date: 01/28/2020 CLINICAL DATA:  GI bleed EXAM: ABDOMEN - 1 VIEW COMPARISON:  02/04/2020 FINDINGS: Prior cholecystectomy. Mild gaseous distention of the stomach. Nonobstructive bowel gas pattern. No organomegaly or free air. IMPRESSION: Mild gaseous distention of the stomach. Electronically Signed   By: 01/14/2020 [name]   On: 01/28/2020 20:00   CT ABDOMEN PELVIS W CONTRAST  Result Date: 01/28/2020 CLINICAL DATA:  Abdominal pain EXAM: CT ABDOMEN AND PELVIS WITH CONTRAST TECHNIQUE: Multidetector CT imaging of the abdomen and pelvis was performed using the standard protocol following bolus administration of intravenous contrast. CONTRAST:  01/12/2020 OMNIPAQUE IOHEXOL 300 MG/ML  SOLN COMPARISON:  January 23, 2020 FINDINGS: Lower chest:  There is a probable right breast hematoma, however an underlying mass cannot be excluded. Outpatient follow-up mammography is recommended.The heart size is mildly enlarged. There is atelectasis at the lung bases. Hepatobiliary: The liver is normal. Patient is status post prior cholecystectomy.Again noted is moderate intrahepatic and extrahepatic biliary ductal dilatation. Pancreas: Normal contours without ductal dilatation. No peripancreatic fluid collection. Spleen: No splenic laceration or hematoma. Adrenals/Urinary Tract: --Adrenal glands: No adrenal hemorrhage. --Right kidney/ureter: No hydronephrosis or perinephric hematoma. --Left kidney/ureter: No hydronephrosis or perinephric hematoma. --Urinary bladder: The bladder is decompressed with a Foley catheter. Stomach/Bowel: --Stomach/Duodenum: The stomach is distended. There is a small hyperdense focus in the dependent portion of stomach which is stable on the delayed phase and therefore is felt to represent hyperdense enteric material as opposed to extravasated contrast. --Small bowel: No dilatation or inflammation. --Colon: There is a large amount of stool at the level of the rectum. There is a moderate amount of stool throughout the remaining portions of the colon. There are colonic diverticula without CT evidence for diverticulitis. --Appendix: Not visualized. No right lower quadrant inflammation or free fluid. Vascular/Lymphatic: Atherosclerotic calcification is present within the non-aneurysmal abdominal aorta, without hemodynamically significant stenosis. --No retroperitoneal lymphadenopathy. --No mesenteric lymphadenopathy. --No pelvic or inguinal lymphadenopathy. Reproductive: Status post hysterectomy. No adnexal mass. Other: No ascites or free air. The abdominal wall is normal. Musculoskeletal. The patient is status post prior L4-L5 posterior fusion. There is a grade 1 anterolisthesis of L3 on L4, likely degenerative. There is moderate severe disc  height loss at the L3-L4 level. There is no acute displaced fracture. Acute right-sided rib fractures are again noted. IMPRESSION: 1. Again noted is a probable right breast hematoma. As an underlying mass cannot be excluded, outpatient follow-up mammography is recommended. 2. Distended stomach.  No evidence for small bowel obstruction. 3. There is a large amount of stool at the level of the rectum. There is a moderate amount of stool throughout the remaining portions of the colon. 4. Again noted are acute right-sided rib fractures. 5. Status post cholecystectomy. There is significant intrahepatic and extrahepatic biliary ductal dilatation. Correlation with laboratory studies is recommended. 6. Aortic Atherosclerosis (ICD10-I70.0). Electronically Signed   By:  Constance Holster [name]   On: 01/28/2020 23:57   DG CHEST PORT 1 VIEW  Result Date: 01/28/2020 CLINICAL DATA:  GI Bleed, Respiration abnormal EXAM: PORTABLE CHEST 1 VIEW COMPARISON:  Chest radiograph 03/26/2020 FINDINGS: Stable cardiomediastinal contours. Aortic arch calcification. Low lung volumes. The lungs are clear. No pneumothorax or significant pleural effusion. No acute finding in the visualized skeleton. IMPRESSION: No acute cardiopulmonary finding. Electronically Signed   By: Audie Pinto [name]   On: 01/28/2020 20:01    Cardiac Studies   Echocardiogram 01/18/2015: Study Conclusions   - Left ventricle: The cavity size was normal. There was mild focal  basal hypertrophy of the septum. Systolic function was normal.  The estimated ejection fraction was in the range of 55% to 60%.  Wall motion was normal; there were no regional wall motion  abnormalities. Doppler parameters are consistent with abnormal  left ventricular relaxation (grade 1 diastolic dysfunction).  - Aortic valve: There was no stenosis.  - Mitral valve: Mildly calcified annulus. There was no significant  regurgitation.  - Left atrium: The atrium was mildly  dilated.  - Right ventricle: The cavity size was normal. Systolic function  was normal.  - Pulmonary arteries: No complete TR doppler jet so unable to  estimate PA systolic pressure.  - Inferior vena cava: The vessel was normal in size. The  respirophasic diameter changes were in the normal range (= 50%),  consistent with normal central venous pressure.   Impressions:   - Normal LV size with mild focal basal septal hypertrophy, EF  55-60%. Normal RV size and systolic function. No significant  valvular abnormalities.     Patient Profile     86 y.o. 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  Assessment & Plan    1.  Essential hypertension, typically on multimodal therapy.  She has been on Inderal LA and minoxidil at lower than outpatient dose.  Recent blood pressures low normal to low.  2.  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  She has frequent PACs at this point.  3.  Emesis yesterday with concern for GI blood loss per chart review.  Follow-up hemoglobin is up to 10.6 after PRBCs.  Continue Inderal LA, hold minoxidil until blood pressure further stabilizes.  Signed, Rozann Lesches, MD  02/08/2020, 8:28 AM

## 2020-02-09 DEATH — deceased

## 2020-02-14 ENCOUNTER — Ambulatory Visit: Payer: Medicare Other | Admitting: Internal Medicine

## 2020-04-24 ENCOUNTER — Ambulatory Visit: Payer: Medicare Other | Admitting: Adult Health Nurse Practitioner

## 2020-09-10 ENCOUNTER — Encounter: Payer: Medicare Other | Admitting: Internal Medicine

## 2021-04-09 IMAGING — DX DG ANKLE COMPLETE 3+V*L*
3 series · 3 of 3 positions shown · non-contrast
Comparison: 01/25/2020, 01/24/2019

CLINICAL DATA: Postop

EXAM:
LEFT ANKLE COMPLETE - 3+ VIEW

[ankle ap]
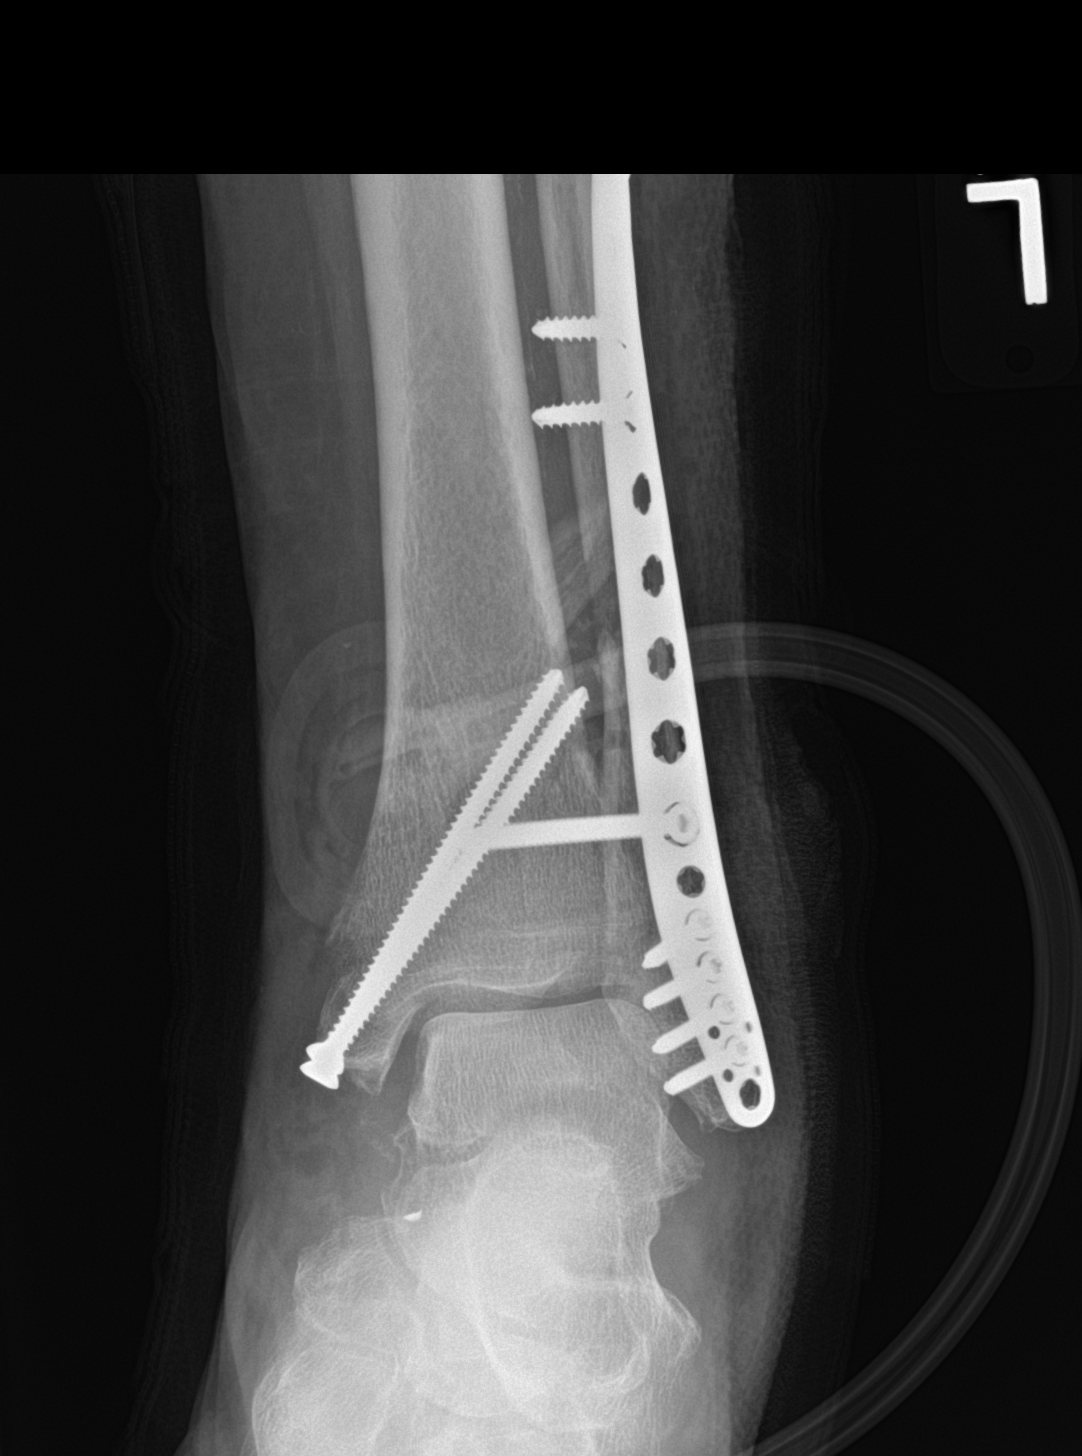

[ankle obl]
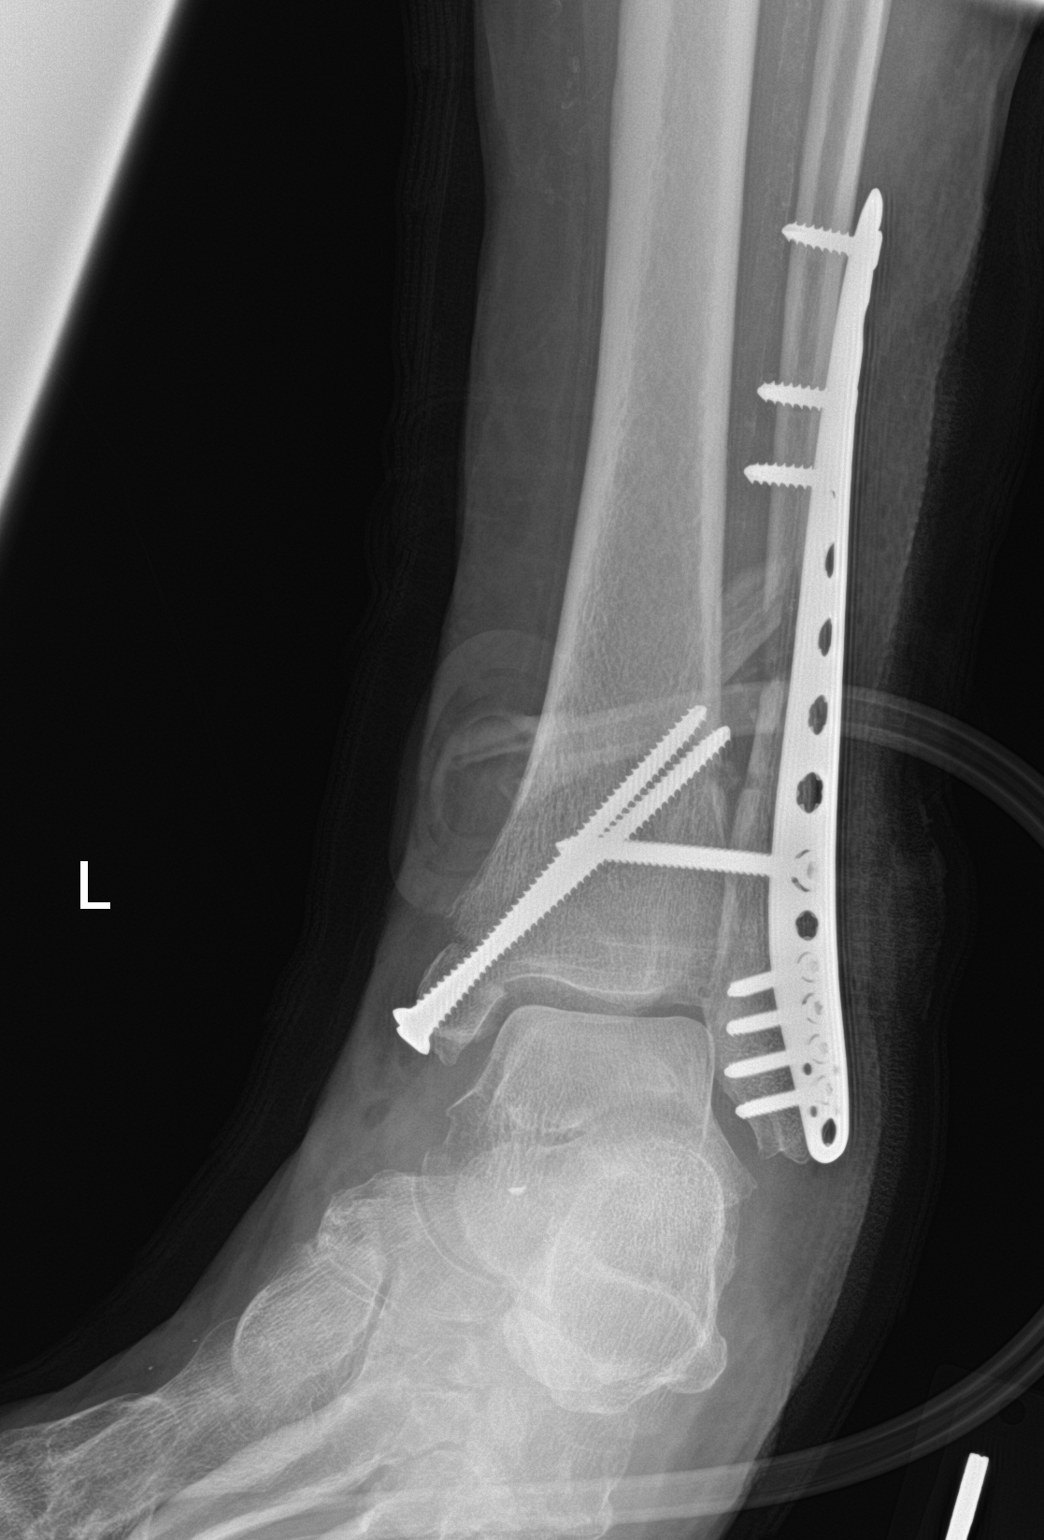

[ankle lat]
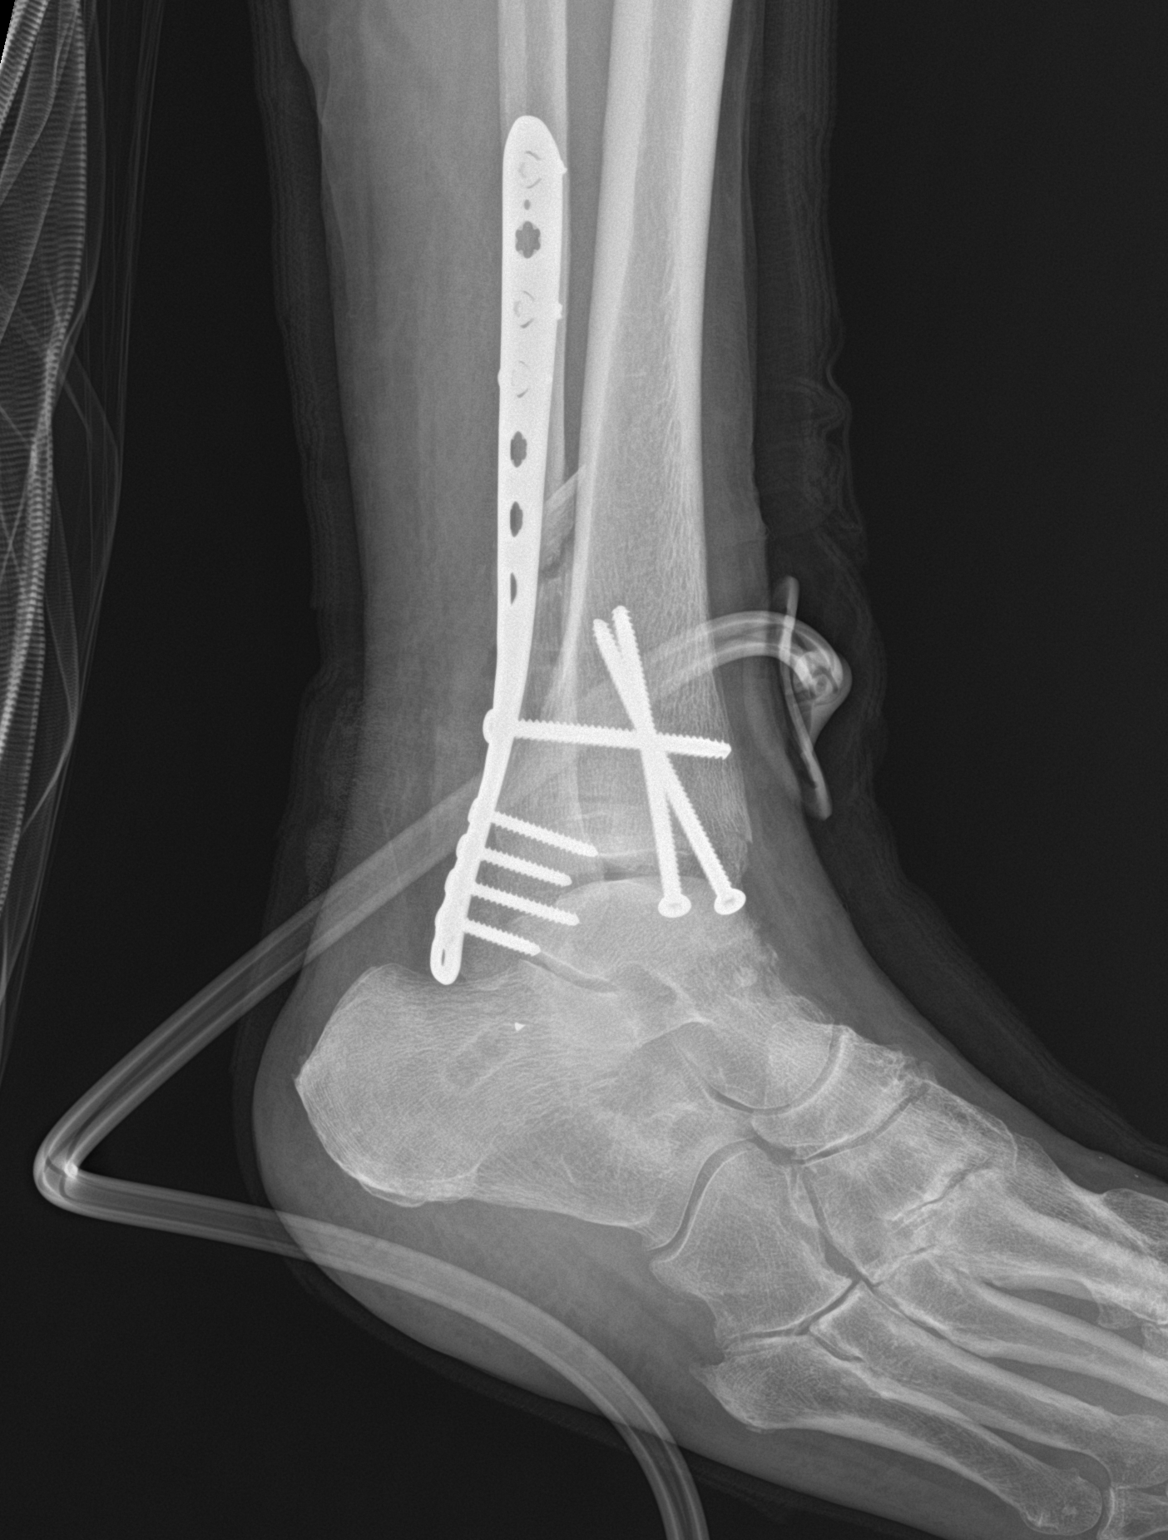

[3 of 3 positions shown; findings below may reference images not displayed]

FINDINGS: Interval surgical plate and multiple fixating screws across
comminuted none fracture involving the distal fibular shaft. 19 mm
medially displaced butterfly fragment. Screw fixation of the medial
malleolus. Gas in the soft tissues consistent with recent surgery.
Fracture is also evident at the distal anterior tibia. Possible
small metallic fragment overlying the calcaneus bone.
IMPRESSION: 1. Interval surgical fixation of distal fibular and distal tibial
fractures with expected postsurgical changes.
2. Small metallic fragment overlying the calcaneus bone

## 2021-04-09 IMAGING — RF DG ANKLE COMPLETE 3+V*L*
1 series · 8 of 8 positions shown · non-contrast
Comparison: 01/24/2020 left ankle radiographs

CLINICAL DATA: ORIF bimalleolar left ankle fractures

EXAM:
DG C-ARM 1-60 MIN; LEFT ANKLE COMPLETE - 3+ VIEW

[Series 1: run · 8 of 8 slices shown]
[im 1/8]
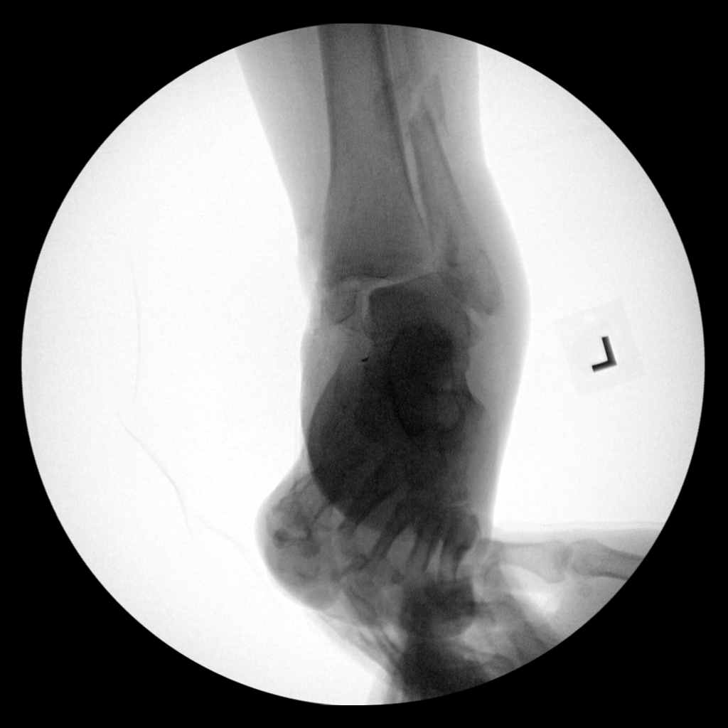
[im 2/8]
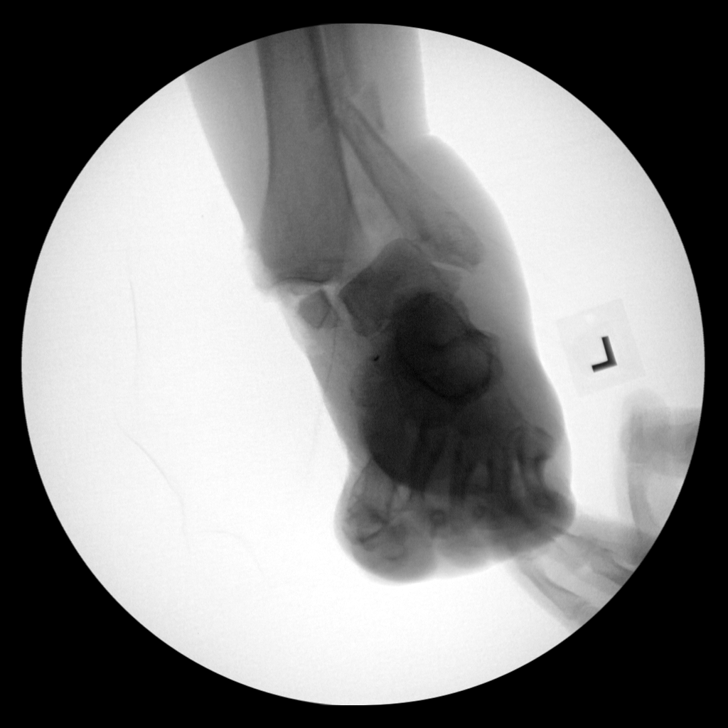
[im 3/8]
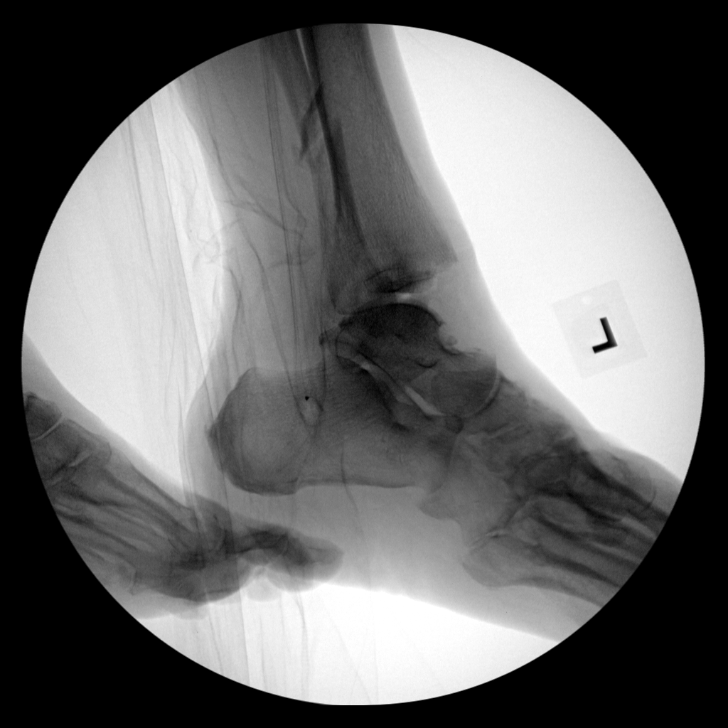
[im 4/8]
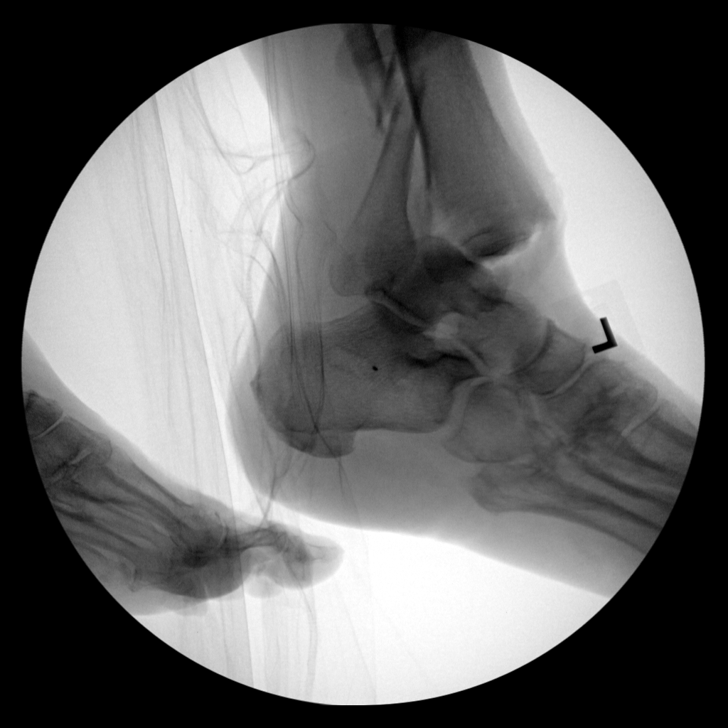
[im 5/8]
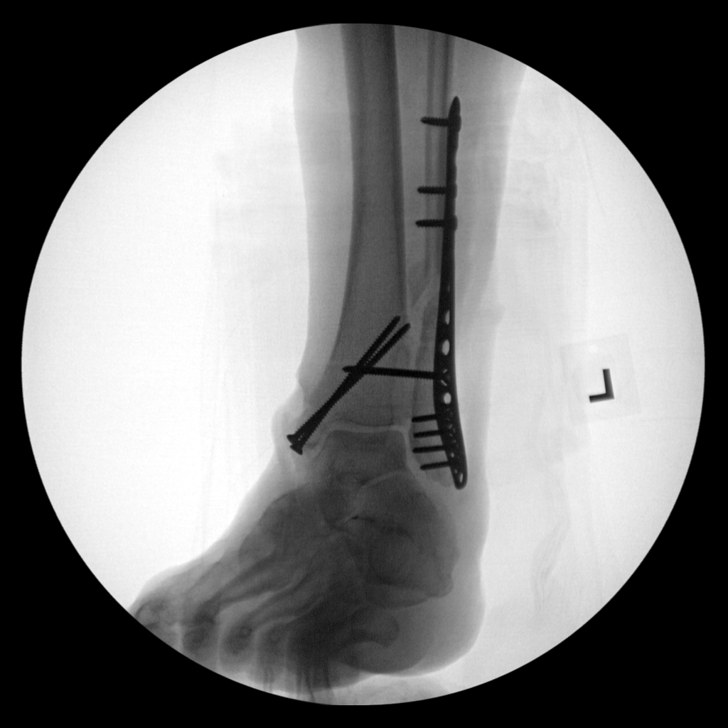
[im 6/8]
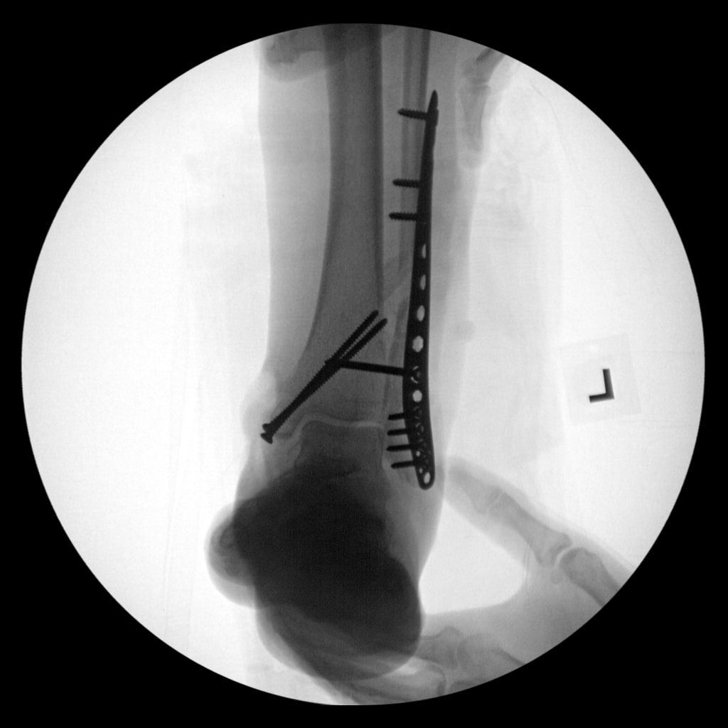
[im 7/8]
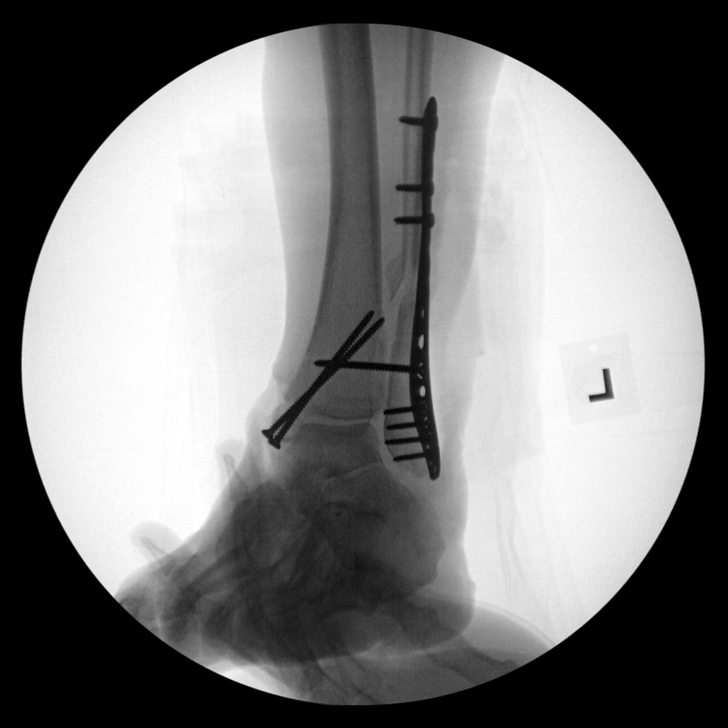
[im 8/8]
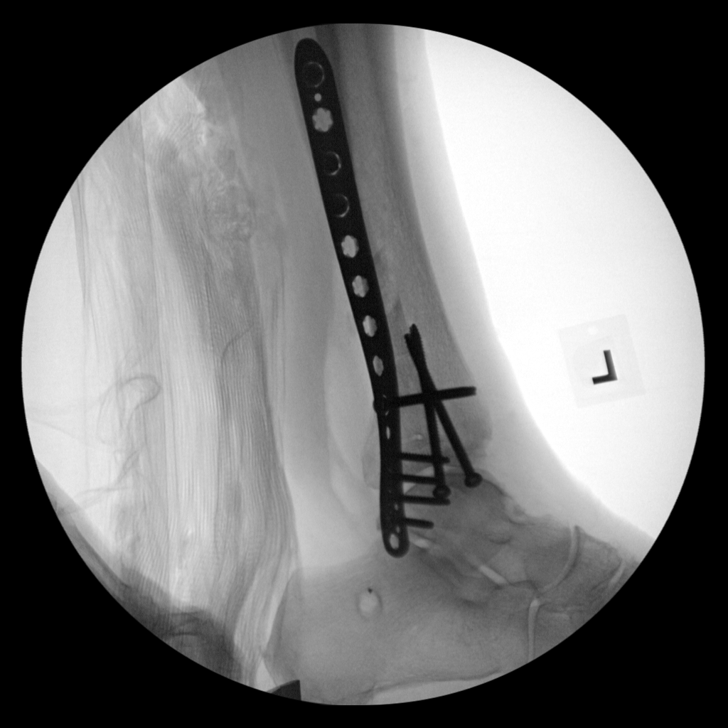

[8 of 8 positions shown; findings below may reference images not displayed]

FLUOROSCOPY TIME:  Fluoroscopy Time:  1 minutes 37 seconds

Number of Acquired Spot Images: 8
FINDINGS: Multiple nondiagnostic spot fluoroscopic intraoperative left ankle
radiographs demonstrate transfixation of medial malleolus fracture
with 2 screws and transfixation of left distal fibula fracture with
lateral surgical plate with multiple interlocking screws including a
single interlocking screw traversing the distal tibiofibular
syndesmosis.
IMPRESSION: Intraoperative fluoroscopic guidance for ORIF bimalleolar left ankle
fractures.
